# Patient Record
Sex: Female | Born: 1935 | Race: White | Hispanic: No | Smoking: Never smoker
Health system: Southern US, Community
[De-identification: ages and names within clinical notes are randomized; demographics above are authoritative.]

## PROBLEM LIST (undated history)

## (undated) DIAGNOSIS — F32A Depression, unspecified: Secondary | ICD-10-CM

## (undated) DIAGNOSIS — C44319 Basal cell carcinoma of skin of other parts of face: Secondary | ICD-10-CM

## (undated) DIAGNOSIS — I10 Essential (primary) hypertension: Secondary | ICD-10-CM

## (undated) DIAGNOSIS — N200 Calculus of kidney: Secondary | ICD-10-CM

## (undated) DIAGNOSIS — F419 Anxiety disorder, unspecified: Secondary | ICD-10-CM

## (undated) DIAGNOSIS — N201 Calculus of ureter: Secondary | ICD-10-CM

## (undated) DIAGNOSIS — F0390 Unspecified dementia without behavioral disturbance: Secondary | ICD-10-CM

## (undated) DIAGNOSIS — E785 Hyperlipidemia, unspecified: Secondary | ICD-10-CM

## (undated) DIAGNOSIS — Z8719 Personal history of other diseases of the digestive system: Secondary | ICD-10-CM

## (undated) DIAGNOSIS — Z950 Presence of cardiac pacemaker: Secondary | ICD-10-CM

## (undated) DIAGNOSIS — K219 Gastro-esophageal reflux disease without esophagitis: Secondary | ICD-10-CM

## (undated) DIAGNOSIS — I509 Heart failure, unspecified: Secondary | ICD-10-CM

## (undated) DIAGNOSIS — J189 Pneumonia, unspecified organism: Secondary | ICD-10-CM

## (undated) DIAGNOSIS — I872 Venous insufficiency (chronic) (peripheral): Secondary | ICD-10-CM

## (undated) DIAGNOSIS — I495 Sick sinus syndrome: Secondary | ICD-10-CM

## (undated) DIAGNOSIS — Z953 Presence of xenogenic heart valve: Secondary | ICD-10-CM

## (undated) DIAGNOSIS — F329 Major depressive disorder, single episode, unspecified: Secondary | ICD-10-CM

## (undated) DIAGNOSIS — M1990 Unspecified osteoarthritis, unspecified site: Secondary | ICD-10-CM

## (undated) DIAGNOSIS — G250 Essential tremor: Secondary | ICD-10-CM

## (undated) DIAGNOSIS — Z973 Presence of spectacles and contact lenses: Secondary | ICD-10-CM

## (undated) DIAGNOSIS — R600 Localized edema: Secondary | ICD-10-CM

## (undated) DIAGNOSIS — K222 Esophageal obstruction: Secondary | ICD-10-CM

## (undated) DIAGNOSIS — E213 Hyperparathyroidism, unspecified: Secondary | ICD-10-CM

## (undated) DIAGNOSIS — I4821 Permanent atrial fibrillation: Secondary | ICD-10-CM

## (undated) DIAGNOSIS — M8580 Other specified disorders of bone density and structure, unspecified site: Secondary | ICD-10-CM

## (undated) DIAGNOSIS — D693 Immune thrombocytopenic purpura: Secondary | ICD-10-CM

## (undated) DIAGNOSIS — I639 Cerebral infarction, unspecified: Secondary | ICD-10-CM

## (undated) HISTORY — PX: TRANSTHORACIC ECHOCARDIOGRAM: SHX275

## (undated) HISTORY — PX: ESOPHAGOGASTRODUODENOSCOPY (EGD) WITH ESOPHAGEAL DILATION: SHX5812

## (undated) HISTORY — PX: OTHER SURGICAL HISTORY: SHX169

## (undated) HISTORY — PX: CARDIAC CATHETERIZATION: SHX172

## (undated) HISTORY — PX: CARDIAC VALVE REPLACEMENT: SHX585

## (undated) HISTORY — PX: CATARACT EXTRACTION W/ INTRAOCULAR LENS  IMPLANT, BILATERAL: SHX1307

---
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
## 1980-02-15 HISTORY — PX: VAGINAL HYSTERECTOMY: SUR661

## 1997-08-27 ENCOUNTER — Ambulatory Visit (HOSPITAL_COMMUNITY): Admission: RE | Admit: 1997-08-27 | Discharge: 1997-08-27 | Payer: Self-pay | Admitting: Gastroenterology

## 1998-07-23 ENCOUNTER — Inpatient Hospital Stay (HOSPITAL_COMMUNITY): Admission: AD | Admit: 1998-07-23 | Discharge: 1998-07-23 | Payer: Self-pay | Admitting: Obstetrics & Gynecology

## 1998-11-11 ENCOUNTER — Other Ambulatory Visit: Admission: RE | Admit: 1998-11-11 | Discharge: 1998-11-11 | Payer: Self-pay | Admitting: Obstetrics and Gynecology

## 1999-03-01 HISTORY — PX: NM MYOCAR PERF WALL MOTION: HXRAD629

## 1999-03-11 ENCOUNTER — Encounter: Admission: RE | Admit: 1999-03-11 | Discharge: 1999-03-11 | Payer: Self-pay | Admitting: Cardiology

## 1999-03-11 ENCOUNTER — Encounter: Payer: Self-pay | Admitting: Cardiology

## 1999-03-17 ENCOUNTER — Ambulatory Visit (HOSPITAL_COMMUNITY): Admission: RE | Admit: 1999-03-17 | Discharge: 1999-03-17 | Payer: Self-pay | Admitting: Cardiology

## 1999-05-31 ENCOUNTER — Ambulatory Visit (HOSPITAL_COMMUNITY): Admission: RE | Admit: 1999-05-31 | Discharge: 1999-05-31 | Payer: Self-pay | Admitting: Gastroenterology

## 1999-05-31 ENCOUNTER — Encounter: Payer: Self-pay | Admitting: Gastroenterology

## 1999-10-05 ENCOUNTER — Encounter: Payer: Self-pay | Admitting: Gastroenterology

## 1999-10-05 ENCOUNTER — Ambulatory Visit (HOSPITAL_COMMUNITY): Admission: RE | Admit: 1999-10-05 | Discharge: 1999-10-05 | Payer: Self-pay | Admitting: Gastroenterology

## 1999-11-27 ENCOUNTER — Encounter: Payer: Self-pay | Admitting: Cardiovascular Disease

## 1999-11-27 ENCOUNTER — Inpatient Hospital Stay (HOSPITAL_COMMUNITY): Admission: EM | Admit: 1999-11-27 | Discharge: 1999-11-27 | Payer: Self-pay | Admitting: Emergency Medicine

## 2001-03-06 ENCOUNTER — Inpatient Hospital Stay (HOSPITAL_COMMUNITY): Admission: EM | Admit: 2001-03-06 | Discharge: 2001-03-10 | Payer: Self-pay

## 2001-03-06 ENCOUNTER — Encounter: Payer: Self-pay | Admitting: Cardiology

## 2001-03-28 ENCOUNTER — Encounter: Payer: Self-pay | Admitting: Family Medicine

## 2001-03-28 ENCOUNTER — Ambulatory Visit (HOSPITAL_COMMUNITY): Admission: RE | Admit: 2001-03-28 | Discharge: 2001-03-28 | Payer: Self-pay | Admitting: Family Medicine

## 2001-07-08 ENCOUNTER — Emergency Department (HOSPITAL_COMMUNITY): Admission: EM | Admit: 2001-07-08 | Discharge: 2001-07-08 | Payer: Self-pay | Admitting: Emergency Medicine

## 2002-01-31 ENCOUNTER — Inpatient Hospital Stay (HOSPITAL_COMMUNITY): Admission: EM | Admit: 2002-01-31 | Discharge: 2002-02-02 | Payer: Self-pay | Admitting: Emergency Medicine

## 2002-01-31 ENCOUNTER — Encounter: Payer: Self-pay | Admitting: Emergency Medicine

## 2002-02-04 ENCOUNTER — Inpatient Hospital Stay (HOSPITAL_COMMUNITY): Admission: EM | Admit: 2002-02-04 | Discharge: 2002-02-06 | Payer: Self-pay | Admitting: *Deleted

## 2002-11-27 ENCOUNTER — Encounter: Admission: RE | Admit: 2002-11-27 | Discharge: 2003-01-08 | Payer: Self-pay | Admitting: Family Medicine

## 2003-07-10 ENCOUNTER — Emergency Department (HOSPITAL_COMMUNITY): Admission: EM | Admit: 2003-07-10 | Discharge: 2003-07-10 | Payer: Self-pay | Admitting: Emergency Medicine

## 2003-12-29 ENCOUNTER — Other Ambulatory Visit: Admission: RE | Admit: 2003-12-29 | Discharge: 2003-12-29 | Payer: Self-pay | Admitting: Obstetrics and Gynecology

## 2004-06-18 ENCOUNTER — Emergency Department (HOSPITAL_COMMUNITY): Admission: EM | Admit: 2004-06-18 | Discharge: 2004-06-19 | Payer: Self-pay | Admitting: Emergency Medicine

## 2006-01-30 ENCOUNTER — Emergency Department (HOSPITAL_COMMUNITY): Admission: EM | Admit: 2006-01-30 | Discharge: 2006-01-30 | Payer: Self-pay | Admitting: Emergency Medicine

## 2007-02-12 ENCOUNTER — Ambulatory Visit: Payer: Self-pay | Admitting: Internal Medicine

## 2007-02-12 ENCOUNTER — Inpatient Hospital Stay (HOSPITAL_COMMUNITY): Admission: EM | Admit: 2007-02-12 | Discharge: 2007-02-28 | Payer: Self-pay | Admitting: Emergency Medicine

## 2007-02-13 ENCOUNTER — Encounter: Payer: Self-pay | Admitting: Internal Medicine

## 2007-02-13 ENCOUNTER — Ambulatory Visit: Payer: Self-pay | Admitting: Vascular Surgery

## 2007-02-17 ENCOUNTER — Encounter: Payer: Self-pay | Admitting: Pulmonary Disease

## 2007-02-26 HISTORY — PX: PERMANENT PACEMAKER INSERTION: SHX6023

## 2007-03-01 ENCOUNTER — Telehealth: Payer: Self-pay | Admitting: Internal Medicine

## 2007-03-13 ENCOUNTER — Ambulatory Visit: Payer: Self-pay | Admitting: Internal Medicine

## 2007-03-13 DIAGNOSIS — J180 Bronchopneumonia, unspecified organism: Secondary | ICD-10-CM | POA: Insufficient documentation

## 2007-03-27 ENCOUNTER — Ambulatory Visit: Payer: Self-pay | Admitting: Cardiovascular Disease

## 2007-04-04 ENCOUNTER — Ambulatory Visit: Payer: Self-pay | Admitting: Internal Medicine

## 2007-04-04 DIAGNOSIS — I5032 Chronic diastolic (congestive) heart failure: Secondary | ICD-10-CM | POA: Insufficient documentation

## 2007-04-05 ENCOUNTER — Telehealth (INDEPENDENT_AMBULATORY_CARE_PROVIDER_SITE_OTHER): Payer: Self-pay | Admitting: *Deleted

## 2007-04-05 ENCOUNTER — Telehealth: Payer: Self-pay | Admitting: Internal Medicine

## 2007-04-12 ENCOUNTER — Encounter (HOSPITAL_COMMUNITY): Admission: RE | Admit: 2007-04-12 | Discharge: 2007-07-11 | Payer: Self-pay | Admitting: Cardiology

## 2007-07-12 ENCOUNTER — Encounter (HOSPITAL_COMMUNITY): Admission: RE | Admit: 2007-07-12 | Discharge: 2007-08-15 | Payer: Self-pay | Admitting: Cardiology

## 2007-12-14 ENCOUNTER — Telehealth (INDEPENDENT_AMBULATORY_CARE_PROVIDER_SITE_OTHER): Payer: Self-pay | Admitting: *Deleted

## 2008-01-23 ENCOUNTER — Ambulatory Visit: Payer: Self-pay | Admitting: Internal Medicine

## 2009-02-09 ENCOUNTER — Encounter: Admission: RE | Admit: 2009-02-09 | Discharge: 2009-02-09 | Payer: Self-pay | Admitting: Cardiology

## 2009-03-11 ENCOUNTER — Encounter (INDEPENDENT_AMBULATORY_CARE_PROVIDER_SITE_OTHER): Payer: Self-pay | Admitting: Cardiology

## 2009-03-11 ENCOUNTER — Ambulatory Visit (HOSPITAL_COMMUNITY): Admission: RE | Admit: 2009-03-11 | Discharge: 2009-03-11 | Payer: Self-pay | Admitting: Cardiology

## 2009-03-18 ENCOUNTER — Ambulatory Visit: Payer: Self-pay | Admitting: Cardiothoracic Surgery

## 2009-03-27 ENCOUNTER — Ambulatory Visit (HOSPITAL_COMMUNITY): Admission: RE | Admit: 2009-03-27 | Discharge: 2009-03-27 | Payer: Self-pay | Admitting: Cardiology

## 2009-03-27 HISTORY — PX: CARDIAC CATHETERIZATION: SHX172

## 2009-04-01 ENCOUNTER — Ambulatory Visit: Payer: Self-pay | Admitting: Vascular Surgery

## 2009-04-01 ENCOUNTER — Encounter: Payer: Self-pay | Admitting: Cardiothoracic Surgery

## 2009-04-03 ENCOUNTER — Encounter: Payer: Self-pay | Admitting: Cardiothoracic Surgery

## 2009-04-03 ENCOUNTER — Inpatient Hospital Stay (HOSPITAL_COMMUNITY): Admission: RE | Admit: 2009-04-03 | Discharge: 2009-04-13 | Payer: Self-pay | Admitting: Cardiothoracic Surgery

## 2009-04-03 ENCOUNTER — Ambulatory Visit: Payer: Self-pay | Admitting: Cardiothoracic Surgery

## 2009-04-17 ENCOUNTER — Inpatient Hospital Stay (HOSPITAL_COMMUNITY): Admission: EM | Admit: 2009-04-17 | Discharge: 2009-04-23 | Payer: Self-pay | Admitting: Emergency Medicine

## 2009-04-21 ENCOUNTER — Encounter (INDEPENDENT_AMBULATORY_CARE_PROVIDER_SITE_OTHER): Payer: Self-pay | Admitting: Internal Medicine

## 2009-05-06 ENCOUNTER — Ambulatory Visit: Payer: Self-pay | Admitting: Cardiothoracic Surgery

## 2009-05-06 ENCOUNTER — Encounter: Admission: RE | Admit: 2009-05-06 | Discharge: 2009-05-06 | Payer: Self-pay | Admitting: Cardiothoracic Surgery

## 2009-05-20 ENCOUNTER — Ambulatory Visit: Payer: Self-pay | Admitting: Cardiothoracic Surgery

## 2009-05-28 ENCOUNTER — Encounter (HOSPITAL_COMMUNITY): Admission: RE | Admit: 2009-05-28 | Discharge: 2009-08-13 | Payer: Self-pay | Admitting: Cardiology

## 2009-06-17 ENCOUNTER — Ambulatory Visit: Payer: Self-pay | Admitting: Cardiothoracic Surgery

## 2009-07-27 ENCOUNTER — Ambulatory Visit: Payer: Self-pay | Admitting: Cardiothoracic Surgery

## 2009-08-14 ENCOUNTER — Encounter (HOSPITAL_COMMUNITY): Admission: RE | Admit: 2009-08-14 | Discharge: 2009-08-28 | Payer: Self-pay | Admitting: Cardiology

## 2010-05-07 LAB — POCT I-STAT 3, ART BLOOD GAS (G3+)
Acid-Base Excess: 2 mmol/L (ref 0.0–2.0)
Acid-Base Excess: 3 mmol/L — ABNORMAL HIGH (ref 0.0–2.0)
Acid-Base Excess: 5 mmol/L — ABNORMAL HIGH (ref 0.0–2.0)
Acid-base deficit: 1 mmol/L (ref 0.0–2.0)
Acid-base deficit: 2 mmol/L (ref 0.0–2.0)
Acid-base deficit: 3 mmol/L — ABNORMAL HIGH (ref 0.0–2.0)
Acid-base deficit: 3 mmol/L — ABNORMAL HIGH (ref 0.0–2.0)
Acid-base deficit: 4 mmol/L — ABNORMAL HIGH (ref 0.0–2.0)
Bicarbonate: 22.1 mEq/L (ref 20.0–24.0)
Bicarbonate: 22.2 mEq/L (ref 20.0–24.0)
Bicarbonate: 22.2 mEq/L (ref 20.0–24.0)
Bicarbonate: 22.6 mEq/L (ref 20.0–24.0)
Bicarbonate: 23.3 mEq/L (ref 20.0–24.0)
Bicarbonate: 24.7 mEq/L — ABNORMAL HIGH (ref 20.0–24.0)
Bicarbonate: 26.8 mEq/L — ABNORMAL HIGH (ref 20.0–24.0)
Bicarbonate: 29.6 mEq/L — ABNORMAL HIGH (ref 20.0–24.0)
O2 Saturation: 100 %
O2 Saturation: 100 %
O2 Saturation: 100 %
O2 Saturation: 100 %
O2 Saturation: 91 %
O2 Saturation: 96 %
O2 Saturation: 97 %
O2 Saturation: 97 %
O2 Saturation: 98 %
Patient temperature: 35.5
Patient temperature: 36.7
Patient temperature: 36.8
Patient temperature: 37.6
TCO2: 23 mmol/L (ref 0–100)
TCO2: 23 mmol/L (ref 0–100)
TCO2: 24 mmol/L (ref 0–100)
TCO2: 24 mmol/L (ref 0–100)
TCO2: 24 mmol/L (ref 0–100)
TCO2: 26 mmol/L (ref 0–100)
TCO2: 27 mmol/L (ref 0–100)
TCO2: 28 mmol/L (ref 0–100)
TCO2: 31 mmol/L (ref 0–100)
pCO2 arterial: 34.3 mmHg — ABNORMAL LOW (ref 35.0–45.0)
pCO2 arterial: 34.9 mmHg — ABNORMAL LOW (ref 35.0–45.0)
pCO2 arterial: 38.3 mmHg (ref 35.0–45.0)
pCO2 arterial: 38.9 mmHg (ref 35.0–45.0)
pCO2 arterial: 39.3 mmHg (ref 35.0–45.0)
pCO2 arterial: 41 mmHg (ref 35.0–45.0)
pCO2 arterial: 41.1 mmHg (ref 35.0–45.0)
pCO2 arterial: 42.3 mmHg (ref 35.0–45.0)
pCO2 arterial: 44.9 mmHg (ref 35.0–45.0)
pH, Arterial: 7.301 — ABNORMAL LOW (ref 7.350–7.400)
pH, Arterial: 7.341 — ABNORMAL LOW (ref 7.350–7.400)
pH, Arterial: 7.375 (ref 7.350–7.400)
pH, Arterial: 7.389 (ref 7.350–7.400)
pH, Arterial: 7.392 (ref 7.350–7.400)
pH, Arterial: 7.41 — ABNORMAL HIGH (ref 7.350–7.400)
pH, Arterial: 7.453 — ABNORMAL HIGH (ref 7.350–7.400)
pH, Arterial: 7.493 — ABNORMAL HIGH (ref 7.350–7.400)
pO2, Arterial: 103 mmHg — ABNORMAL HIGH (ref 80.0–100.0)
pO2, Arterial: 248 mmHg — ABNORMAL HIGH (ref 80.0–100.0)
pO2, Arterial: 256 mmHg — ABNORMAL HIGH (ref 80.0–100.0)
pO2, Arterial: 260 mmHg — ABNORMAL HIGH (ref 80.0–100.0)
pO2, Arterial: 389 mmHg — ABNORMAL HIGH (ref 80.0–100.0)
pO2, Arterial: 82 mmHg (ref 80.0–100.0)
pO2, Arterial: 87 mmHg (ref 80.0–100.0)
pO2, Arterial: 97 mmHg (ref 80.0–100.0)

## 2010-05-07 LAB — URINALYSIS, ROUTINE W REFLEX MICROSCOPIC
Bilirubin Urine: NEGATIVE
Bilirubin Urine: NEGATIVE
Glucose, UA: NEGATIVE mg/dL
Glucose, UA: NEGATIVE mg/dL
Hgb urine dipstick: NEGATIVE
Hgb urine dipstick: NEGATIVE
Ketones, ur: NEGATIVE mg/dL
Ketones, ur: NEGATIVE mg/dL
Nitrite: NEGATIVE
Nitrite: NEGATIVE
Protein, ur: NEGATIVE mg/dL
Protein, ur: NEGATIVE mg/dL
Specific Gravity, Urine: 1.016 (ref 1.005–1.030)
Specific Gravity, Urine: 1.022 (ref 1.005–1.030)
Urobilinogen, UA: 0.2 mg/dL (ref 0.0–1.0)
Urobilinogen, UA: 0.2 mg/dL (ref 0.0–1.0)
pH: 5.5 (ref 5.0–8.0)
pH: 6 (ref 5.0–8.0)

## 2010-05-07 LAB — PREPARE PLATELETS

## 2010-05-07 LAB — CBC
HCT: 24.6 % — ABNORMAL LOW (ref 36.0–46.0)
HCT: 25.1 % — ABNORMAL LOW (ref 36.0–46.0)
HCT: 25.3 % — ABNORMAL LOW (ref 36.0–46.0)
HCT: 25.5 % — ABNORMAL LOW (ref 36.0–46.0)
HCT: 25.5 % — ABNORMAL LOW (ref 36.0–46.0)
HCT: 25.9 % — ABNORMAL LOW (ref 36.0–46.0)
HCT: 26.5 % — ABNORMAL LOW (ref 36.0–46.0)
HCT: 27.6 % — ABNORMAL LOW (ref 36.0–46.0)
HCT: 27.9 % — ABNORMAL LOW (ref 36.0–46.0)
HCT: 28.1 % — ABNORMAL LOW (ref 36.0–46.0)
HCT: 41.8 % (ref 36.0–46.0)
Hemoglobin: 14.1 g/dL (ref 12.0–15.0)
Hemoglobin: 8.3 g/dL — ABNORMAL LOW (ref 12.0–15.0)
Hemoglobin: 8.5 g/dL — ABNORMAL LOW (ref 12.0–15.0)
Hemoglobin: 8.5 g/dL — ABNORMAL LOW (ref 12.0–15.0)
Hemoglobin: 8.6 g/dL — ABNORMAL LOW (ref 12.0–15.0)
Hemoglobin: 8.8 g/dL — ABNORMAL LOW (ref 12.0–15.0)
Hemoglobin: 8.8 g/dL — ABNORMAL LOW (ref 12.0–15.0)
Hemoglobin: 8.8 g/dL — ABNORMAL LOW (ref 12.0–15.0)
Hemoglobin: 9.3 g/dL — ABNORMAL LOW (ref 12.0–15.0)
Hemoglobin: 9.5 g/dL — ABNORMAL LOW (ref 12.0–15.0)
Hemoglobin: 9.6 g/dL — ABNORMAL LOW (ref 12.0–15.0)
MCHC: 33.2 g/dL (ref 30.0–36.0)
MCHC: 33.7 g/dL (ref 30.0–36.0)
MCHC: 33.7 g/dL (ref 30.0–36.0)
MCHC: 33.7 g/dL (ref 30.0–36.0)
MCHC: 33.7 g/dL (ref 30.0–36.0)
MCHC: 33.8 g/dL (ref 30.0–36.0)
MCHC: 33.8 g/dL (ref 30.0–36.0)
MCHC: 33.9 g/dL (ref 30.0–36.0)
MCHC: 34 g/dL (ref 30.0–36.0)
MCHC: 34.2 g/dL (ref 30.0–36.0)
MCHC: 34.7 g/dL (ref 30.0–36.0)
MCV: 93 fL (ref 78.0–100.0)
MCV: 93.4 fL (ref 78.0–100.0)
MCV: 93.9 fL (ref 78.0–100.0)
MCV: 94.1 fL (ref 78.0–100.0)
MCV: 94.3 fL (ref 78.0–100.0)
MCV: 94.3 fL (ref 78.0–100.0)
MCV: 94.6 fL (ref 78.0–100.0)
MCV: 94.8 fL (ref 78.0–100.0)
MCV: 94.9 fL (ref 78.0–100.0)
MCV: 95.2 fL (ref 78.0–100.0)
MCV: 95.3 fL (ref 78.0–100.0)
Platelets: 100 10*3/uL — ABNORMAL LOW (ref 150–400)
Platelets: 116 10*3/uL — ABNORMAL LOW (ref 150–400)
Platelets: 58 10*3/uL — ABNORMAL LOW (ref 150–400)
Platelets: 60 10*3/uL — ABNORMAL LOW (ref 150–400)
Platelets: 69 10*3/uL — ABNORMAL LOW (ref 150–400)
Platelets: 70 10*3/uL — ABNORMAL LOW (ref 150–400)
Platelets: 71 10*3/uL — ABNORMAL LOW (ref 150–400)
Platelets: 76 10*3/uL — ABNORMAL LOW (ref 150–400)
Platelets: 83 10*3/uL — ABNORMAL LOW (ref 150–400)
Platelets: 88 10*3/uL — ABNORMAL LOW (ref 150–400)
Platelets: 99 10*3/uL — ABNORMAL LOW (ref 150–400)
RBC: 2.61 MIL/uL — ABNORMAL LOW (ref 3.87–5.11)
RBC: 2.65 MIL/uL — ABNORMAL LOW (ref 3.87–5.11)
RBC: 2.69 MIL/uL — ABNORMAL LOW (ref 3.87–5.11)
RBC: 2.71 MIL/uL — ABNORMAL LOW (ref 3.87–5.11)
RBC: 2.74 MIL/uL — ABNORMAL LOW (ref 3.87–5.11)
RBC: 2.76 MIL/uL — ABNORMAL LOW (ref 3.87–5.11)
RBC: 2.78 MIL/uL — ABNORMAL LOW (ref 3.87–5.11)
RBC: 2.92 MIL/uL — ABNORMAL LOW (ref 3.87–5.11)
RBC: 2.95 MIL/uL — ABNORMAL LOW (ref 3.87–5.11)
RBC: 2.99 MIL/uL — ABNORMAL LOW (ref 3.87–5.11)
RBC: 4.42 MIL/uL (ref 3.87–5.11)
RDW: 14.5 % (ref 11.5–15.5)
RDW: 14.6 % (ref 11.5–15.5)
RDW: 14.8 % (ref 11.5–15.5)
RDW: 14.9 % (ref 11.5–15.5)
RDW: 15.2 % (ref 11.5–15.5)
RDW: 15.2 % (ref 11.5–15.5)
RDW: 15.2 % (ref 11.5–15.5)
RDW: 15.2 % (ref 11.5–15.5)
RDW: 15.3 % (ref 11.5–15.5)
RDW: 15.5 % (ref 11.5–15.5)
RDW: 16 % — ABNORMAL HIGH (ref 11.5–15.5)
WBC: 10 10*3/uL (ref 4.0–10.5)
WBC: 10.1 10*3/uL (ref 4.0–10.5)
WBC: 10.5 10*3/uL (ref 4.0–10.5)
WBC: 6.1 10*3/uL (ref 4.0–10.5)
WBC: 6.8 10*3/uL (ref 4.0–10.5)
WBC: 7.8 10*3/uL (ref 4.0–10.5)
WBC: 7.9 10*3/uL (ref 4.0–10.5)
WBC: 8.1 10*3/uL (ref 4.0–10.5)
WBC: 8.1 10*3/uL (ref 4.0–10.5)
WBC: 8.6 10*3/uL (ref 4.0–10.5)
WBC: 9.8 10*3/uL (ref 4.0–10.5)

## 2010-05-07 LAB — POCT I-STAT 3, VENOUS BLOOD GAS (G3P V)
Acid-Base Excess: 1 mmol/L (ref 0.0–2.0)
Acid-Base Excess: 2 mmol/L (ref 0.0–2.0)
Bicarbonate: 25.7 mEq/L — ABNORMAL HIGH (ref 20.0–24.0)
Bicarbonate: 27 mEq/L — ABNORMAL HIGH (ref 20.0–24.0)
O2 Saturation: 60 %
O2 Saturation: 82 %
TCO2: 27 mmol/L (ref 0–100)
TCO2: 28 mmol/L (ref 0–100)
pCO2, Ven: 35.1 mmHg — ABNORMAL LOW (ref 45.0–50.0)
pH, Ven: 7.472 — ABNORMAL HIGH (ref 7.250–7.300)
pO2, Ven: 43 mmHg (ref 30.0–45.0)

## 2010-05-07 LAB — BASIC METABOLIC PANEL
BUN: 11 mg/dL (ref 6–23)
BUN: 20 mg/dL (ref 6–23)
BUN: 24 mg/dL — ABNORMAL HIGH (ref 6–23)
CO2: 23 mEq/L (ref 19–32)
CO2: 31 mEq/L (ref 19–32)
CO2: 32 mEq/L (ref 19–32)
Calcium: 8.9 mg/dL (ref 8.4–10.5)
Calcium: 8.9 mg/dL (ref 8.4–10.5)
Calcium: 9.3 mg/dL (ref 8.4–10.5)
Chloride: 103 mEq/L (ref 96–112)
Chloride: 104 mEq/L (ref 96–112)
Chloride: 113 mEq/L — ABNORMAL HIGH (ref 96–112)
Creatinine, Ser: 0.92 mg/dL (ref 0.4–1.2)
Creatinine, Ser: 1.03 mg/dL (ref 0.4–1.2)
Creatinine, Ser: 1.13 mg/dL (ref 0.4–1.2)
GFR calc Af Amer: 56 mL/min — ABNORMAL LOW (ref >60)
GFR calc Af Amer: >60 mL/min (ref >60)
GFR calc Af Amer: >60 mL/min (ref >60)
GFR calc non Af Amer: 47 mL/min — ABNORMAL LOW (ref >60)
GFR calc non Af Amer: 52 mL/min — ABNORMAL LOW (ref >60)
GFR calc non Af Amer: 59 mL/min — ABNORMAL LOW (ref >60)
Glucose, Bld: 115 mg/dL — ABNORMAL HIGH (ref 70–99)
Glucose, Bld: 144 mg/dL — ABNORMAL HIGH (ref 70–99)
Glucose, Bld: 85 mg/dL (ref 70–99)
Potassium: 3.5 mEq/L (ref 3.5–5.1)
Potassium: 4 mEq/L (ref 3.5–5.1)
Potassium: 4.1 mEq/L (ref 3.5–5.1)
Sodium: 138 mEq/L (ref 135–145)
Sodium: 140 mEq/L (ref 135–145)
Sodium: 140 mEq/L (ref 135–145)

## 2010-05-07 LAB — TYPE AND SCREEN
ABO/RH(D): A POS
Antibody Screen: NEGATIVE

## 2010-05-07 LAB — URINE CULTURE
Colony Count: NO GROWTH
Culture: NO GROWTH

## 2010-05-07 LAB — PREPARE RBC (CROSSMATCH)

## 2010-05-07 LAB — POCT I-STAT 4, (NA,K, GLUC, HGB,HCT)
Glucose, Bld: 103 mg/dL — ABNORMAL HIGH (ref 70–99)
Glucose, Bld: 112 mg/dL — ABNORMAL HIGH (ref 70–99)
Glucose, Bld: 119 mg/dL — ABNORMAL HIGH (ref 70–99)
Glucose, Bld: 128 mg/dL — ABNORMAL HIGH (ref 70–99)
Glucose, Bld: 136 mg/dL — ABNORMAL HIGH (ref 70–99)
Glucose, Bld: 139 mg/dL — ABNORMAL HIGH (ref 70–99)
Glucose, Bld: 89 mg/dL (ref 70–99)
Glucose, Bld: 90 mg/dL (ref 70–99)
Glucose, Bld: 93 mg/dL (ref 70–99)
Glucose, Bld: 94 mg/dL (ref 70–99)
Glucose, Bld: 94 mg/dL (ref 70–99)
Glucose, Bld: 97 mg/dL (ref 70–99)
HCT: 24 % — ABNORMAL LOW (ref 36.0–46.0)
HCT: 25 % — ABNORMAL LOW (ref 36.0–46.0)
HCT: 26 % — ABNORMAL LOW (ref 36.0–46.0)
HCT: 26 % — ABNORMAL LOW (ref 36.0–46.0)
HCT: 26 % — ABNORMAL LOW (ref 36.0–46.0)
HCT: 26 % — ABNORMAL LOW (ref 36.0–46.0)
HCT: 26 % — ABNORMAL LOW (ref 36.0–46.0)
HCT: 26 % — ABNORMAL LOW (ref 36.0–46.0)
HCT: 26 % — ABNORMAL LOW (ref 36.0–46.0)
HCT: 31 % — ABNORMAL LOW (ref 36.0–46.0)
HCT: 34 % — ABNORMAL LOW (ref 36.0–46.0)
HCT: 37 % (ref 36.0–46.0)
Hemoglobin: 10.5 g/dL — ABNORMAL LOW (ref 12.0–15.0)
Hemoglobin: 11.6 g/dL — ABNORMAL LOW (ref 12.0–15.0)
Hemoglobin: 12.6 g/dL (ref 12.0–15.0)
Hemoglobin: 8.2 g/dL — ABNORMAL LOW (ref 12.0–15.0)
Hemoglobin: 8.5 g/dL — ABNORMAL LOW (ref 12.0–15.0)
Hemoglobin: 8.8 g/dL — ABNORMAL LOW (ref 12.0–15.0)
Hemoglobin: 8.8 g/dL — ABNORMAL LOW (ref 12.0–15.0)
Hemoglobin: 8.8 g/dL — ABNORMAL LOW (ref 12.0–15.0)
Hemoglobin: 8.8 g/dL — ABNORMAL LOW (ref 12.0–15.0)
Hemoglobin: 8.8 g/dL — ABNORMAL LOW (ref 12.0–15.0)
Hemoglobin: 8.8 g/dL — ABNORMAL LOW (ref 12.0–15.0)
Hemoglobin: 8.8 g/dL — ABNORMAL LOW (ref 12.0–15.0)
Potassium: 3.2 mEq/L — ABNORMAL LOW (ref 3.5–5.1)
Potassium: 3.2 mEq/L — ABNORMAL LOW (ref 3.5–5.1)
Potassium: 3.3 mEq/L — ABNORMAL LOW (ref 3.5–5.1)
Potassium: 3.4 mEq/L — ABNORMAL LOW (ref 3.5–5.1)
Potassium: 3.6 mEq/L (ref 3.5–5.1)
Potassium: 3.6 mEq/L (ref 3.5–5.1)
Potassium: 3.7 mEq/L (ref 3.5–5.1)
Potassium: 3.9 mEq/L (ref 3.5–5.1)
Potassium: 4 mEq/L (ref 3.5–5.1)
Potassium: 4 mEq/L (ref 3.5–5.1)
Potassium: 4.1 mEq/L (ref 3.5–5.1)
Potassium: 4.2 mEq/L (ref 3.5–5.1)
Sodium: 135 mEq/L (ref 135–145)
Sodium: 136 mEq/L (ref 135–145)
Sodium: 137 mEq/L (ref 135–145)
Sodium: 138 mEq/L (ref 135–145)
Sodium: 140 mEq/L (ref 135–145)
Sodium: 140 mEq/L (ref 135–145)
Sodium: 141 mEq/L (ref 135–145)
Sodium: 141 mEq/L (ref 135–145)
Sodium: 142 mEq/L (ref 135–145)
Sodium: 142 mEq/L (ref 135–145)
Sodium: 143 mEq/L (ref 135–145)
Sodium: 143 mEq/L (ref 135–145)

## 2010-05-07 LAB — CREATININE, SERUM
Creatinine, Ser: 0.86 mg/dL (ref 0.4–1.2)
Creatinine, Ser: 0.95 mg/dL (ref 0.4–1.2)
GFR calc Af Amer: >60 mL/min (ref >60)
GFR calc Af Amer: >60 mL/min (ref >60)
GFR calc non Af Amer: 57 mL/min — ABNORMAL LOW (ref >60)
GFR calc non Af Amer: >60 mL/min (ref >60)

## 2010-05-07 LAB — BLOOD GAS, ARTERIAL
Acid-Base Excess: 2.4 mmol/L — ABNORMAL HIGH (ref 0.0–2.0)
Bicarbonate: 26.2 mEq/L — ABNORMAL HIGH (ref 20.0–24.0)
Drawn by: 181601
FIO2: 0.21 %
O2 Saturation: 98.4 %
Patient temperature: 98.6
TCO2: 27.4 mmol/L (ref 0–100)
pCO2 arterial: 38.6 mmHg (ref 35.0–45.0)
pH, Arterial: 7.446 — ABNORMAL HIGH (ref 7.350–7.400)
pO2, Arterial: 100 mmHg (ref 80.0–100.0)

## 2010-05-07 LAB — HEMOGLOBIN AND HEMATOCRIT, BLOOD
HCT: 24.9 % — ABNORMAL LOW (ref 36.0–46.0)
Hemoglobin: 8.5 g/dL — ABNORMAL LOW (ref 12.0–15.0)

## 2010-05-07 LAB — HEMOGLOBIN A1C: Mean Plasma Glucose: 117 mg/dL

## 2010-05-07 LAB — URINE MICROSCOPIC-ADD ON

## 2010-05-07 LAB — COMPREHENSIVE METABOLIC PANEL
ALT: 20 U/L (ref 0–35)
ALT: 24 U/L (ref 0–35)
AST: 30 U/L (ref 0–37)
AST: 56 U/L — ABNORMAL HIGH (ref 0–37)
Albumin: 3 g/dL — ABNORMAL LOW (ref 3.5–5.2)
Albumin: 3.6 g/dL (ref 3.5–5.2)
Alkaline Phosphatase: 27 U/L — ABNORMAL LOW (ref 39–117)
Alkaline Phosphatase: 52 U/L (ref 39–117)
BUN: 11 mg/dL (ref 6–23)
BUN: 20 mg/dL (ref 6–23)
CO2: 24 mEq/L (ref 19–32)
CO2: 30 mEq/L (ref 19–32)
Calcium: 9 mg/dL (ref 8.4–10.5)
Calcium: 9.9 mg/dL (ref 8.4–10.5)
Chloride: 109 mEq/L (ref 96–112)
Chloride: 110 mEq/L (ref 96–112)
Creatinine, Ser: 1.01 mg/dL (ref 0.4–1.2)
Creatinine, Ser: 1.03 mg/dL (ref 0.4–1.2)
GFR calc Af Amer: >60 mL/min (ref >60)
GFR calc non Af Amer: 52 mL/min — ABNORMAL LOW (ref >60)
GFR calc non Af Amer: 53 mL/min — ABNORMAL LOW (ref >60)
Glucose, Bld: 102 mg/dL — ABNORMAL HIGH (ref 70–99)
Glucose, Bld: 103 mg/dL — ABNORMAL HIGH (ref 70–99)
Potassium: 3.5 mEq/L (ref 3.5–5.1)
Potassium: 3.9 mEq/L (ref 3.5–5.1)
Sodium: 141 mEq/L (ref 135–145)
Sodium: 144 mEq/L (ref 135–145)
Total Bilirubin: 0.8 mg/dL (ref 0.3–1.2)
Total Bilirubin: 0.9 mg/dL (ref 0.3–1.2)
Total Protein: 4.9 g/dL — ABNORMAL LOW (ref 6.0–8.3)
Total Protein: 5.9 g/dL — ABNORMAL LOW (ref 6.0–8.3)

## 2010-05-07 LAB — POCT I-STAT, CHEM 8
BUN: 12 mg/dL (ref 6–23)
BUN: 12 mg/dL (ref 6–23)
Calcium, Ion: 1.28 mmol/L (ref 1.12–1.32)
Calcium, Ion: 1.36 mmol/L — ABNORMAL HIGH (ref 1.12–1.32)
Chloride: 108 mEq/L (ref 96–112)
Chloride: 115 mEq/L — ABNORMAL HIGH (ref 96–112)
Creatinine, Ser: 0.9 mg/dL (ref 0.4–1.2)
Creatinine, Ser: 1.1 mg/dL (ref 0.4–1.2)
Glucose, Bld: 141 mg/dL — ABNORMAL HIGH (ref 70–99)
Glucose, Bld: 162 mg/dL — ABNORMAL HIGH (ref 70–99)
HCT: 22 % — ABNORMAL LOW (ref 36.0–46.0)
HCT: 28 % — ABNORMAL LOW (ref 36.0–46.0)
Hemoglobin: 7.5 g/dL — ABNORMAL LOW (ref 12.0–15.0)
Hemoglobin: 9.5 g/dL — ABNORMAL LOW (ref 12.0–15.0)
Potassium: 3.5 mEq/L (ref 3.5–5.1)
Potassium: 4 mEq/L (ref 3.5–5.1)
Sodium: 141 mEq/L (ref 135–145)
Sodium: 142 mEq/L (ref 135–145)
TCO2: 22 mmol/L (ref 0–100)
TCO2: 28 mmol/L (ref 0–100)

## 2010-05-07 LAB — GLUCOSE, CAPILLARY
Glucose-Capillary: 100 mg/dL — ABNORMAL HIGH (ref 70–99)
Glucose-Capillary: 102 mg/dL — ABNORMAL HIGH (ref 70–99)
Glucose-Capillary: 106 mg/dL — ABNORMAL HIGH (ref 70–99)
Glucose-Capillary: 107 mg/dL — ABNORMAL HIGH (ref 70–99)
Glucose-Capillary: 114 mg/dL — ABNORMAL HIGH (ref 70–99)
Glucose-Capillary: 116 mg/dL — ABNORMAL HIGH (ref 70–99)
Glucose-Capillary: 116 mg/dL — ABNORMAL HIGH (ref 70–99)
Glucose-Capillary: 117 mg/dL — ABNORMAL HIGH (ref 70–99)
Glucose-Capillary: 121 mg/dL — ABNORMAL HIGH (ref 70–99)
Glucose-Capillary: 123 mg/dL — ABNORMAL HIGH (ref 70–99)
Glucose-Capillary: 137 mg/dL — ABNORMAL HIGH (ref 70–99)
Glucose-Capillary: 143 mg/dL — ABNORMAL HIGH (ref 70–99)
Glucose-Capillary: 154 mg/dL — ABNORMAL HIGH (ref 70–99)
Glucose-Capillary: 96 mg/dL (ref 70–99)

## 2010-05-07 LAB — SURGICAL PCR SCREEN: MRSA, PCR: NEGATIVE

## 2010-05-07 LAB — APTT
aPTT: 28 seconds (ref 24–37)
aPTT: 36 seconds (ref 24–37)

## 2010-05-07 LAB — ABO/RH: ABO/RH(D): A POS

## 2010-05-07 LAB — PROTIME-INR
INR: 0.99 (ref 0.00–1.49)
INR: 1.58 — ABNORMAL HIGH (ref 0.00–1.49)
Prothrombin Time: 13 seconds (ref 11.6–15.2)
Prothrombin Time: 18.7 seconds — ABNORMAL HIGH (ref 11.6–15.2)

## 2010-05-07 LAB — MAGNESIUM
Magnesium: 2.2 mg/dL (ref 1.5–2.5)
Magnesium: 2.5 mg/dL (ref 1.5–2.5)
Magnesium: 2.6 mg/dL — ABNORMAL HIGH (ref 1.5–2.5)

## 2010-05-07 LAB — PREPARE FRESH FROZEN PLASMA

## 2010-05-07 LAB — HEPARIN INDUCED THROMBOCYTOPENIA PNL
Heparin Induced Plt Ab: NEGATIVE
Patient O.D.: 0.121
Serotonin Release: 0 % release (ref <20)

## 2010-05-07 LAB — PLATELET COUNT: Platelets: 50 10*3/uL — ABNORMAL LOW (ref 150–400)

## 2010-05-07 LAB — MRSA PCR SCREENING: MRSA by PCR: NEGATIVE

## 2010-05-10 LAB — COMPREHENSIVE METABOLIC PANEL
BUN: 10 mg/dL (ref 6–23)
Calcium: 9.7 mg/dL (ref 8.4–10.5)
GFR calc non Af Amer: 51 mL/min — ABNORMAL LOW (ref >60)
Glucose, Bld: 103 mg/dL — ABNORMAL HIGH (ref 70–99)
Total Protein: 5 g/dL — ABNORMAL LOW (ref 6.0–8.3)

## 2010-05-10 LAB — URINALYSIS, ROUTINE W REFLEX MICROSCOPIC
Glucose, UA: NEGATIVE mg/dL
Protein, ur: NEGATIVE mg/dL
Specific Gravity, Urine: 1.012 (ref 1.005–1.030)
pH: 5.5 (ref 5.0–8.0)

## 2010-05-10 LAB — BASIC METABOLIC PANEL
CO2: 31 mEq/L (ref 19–32)
Calcium: 9.6 mg/dL (ref 8.4–10.5)
Chloride: 98 mEq/L (ref 96–112)
GFR calc Af Amer: >60 mL/min (ref >60)
GFR calc non Af Amer: 47 mL/min — ABNORMAL LOW (ref >60)
GFR calc non Af Amer: >60 mL/min (ref >60)
Glucose, Bld: 105 mg/dL — ABNORMAL HIGH (ref 70–99)
Glucose, Bld: 107 mg/dL — ABNORMAL HIGH (ref 70–99)
Potassium: 3.4 mEq/L — ABNORMAL LOW (ref 3.5–5.1)
Sodium: 135 mEq/L (ref 135–145)
Sodium: 136 mEq/L (ref 135–145)
Sodium: 140 mEq/L (ref 135–145)

## 2010-05-10 LAB — CBC
HCT: 28 % — ABNORMAL LOW (ref 36.0–46.0)
HCT: 28.6 % — ABNORMAL LOW (ref 36.0–46.0)
Hemoglobin: 9 g/dL — ABNORMAL LOW (ref 12.0–15.0)
Hemoglobin: 9.1 g/dL — ABNORMAL LOW (ref 12.0–15.0)
Hemoglobin: 9.4 g/dL — ABNORMAL LOW (ref 12.0–15.0)
MCHC: 32.8 g/dL (ref 30.0–36.0)
MCHC: 33 g/dL (ref 30.0–36.0)
MCV: 95 fL (ref 78.0–100.0)
MCV: 95.2 fL (ref 78.0–100.0)
RBC: 2.91 MIL/uL — ABNORMAL LOW (ref 3.87–5.11)
RBC: 2.92 MIL/uL — ABNORMAL LOW (ref 3.87–5.11)
RDW: 15.7 % — ABNORMAL HIGH (ref 11.5–15.5)
WBC: 5.7 10*3/uL (ref 4.0–10.5)

## 2010-05-10 LAB — CULTURE, BLOOD (ROUTINE X 2)

## 2010-05-10 LAB — DIFFERENTIAL
Basophils Relative: 1 % (ref 0–1)
Eosinophils Absolute: 0.1 10*3/uL (ref 0.0–0.7)
Eosinophils Relative: 2 % (ref 0–5)
Monocytes Absolute: 1 10*3/uL (ref 0.1–1.0)
Monocytes Relative: 11 % (ref 3–12)

## 2010-05-10 LAB — AMMONIA: Ammonia: 16 umol/L (ref 11–35)

## 2010-05-10 LAB — URINE MICROSCOPIC-ADD ON

## 2010-05-10 LAB — URINE CULTURE: Colony Count: >=100000

## 2010-05-10 LAB — MAGNESIUM: Magnesium: 1.8 mg/dL (ref 1.5–2.5)

## 2010-05-10 LAB — TSH: TSH: 1.673 u[IU]/mL (ref 0.350–4.500)

## 2010-06-29 NOTE — Assessment & Plan Note (Signed)
OFFICE VISIT   [name], [name]  DOB:  1930/02/09                                        Jun 17, 2009  CHART #:  [id number]   CURRENT PROBLEMS:  1. Status post mitral valve replacement with caudal preservation using      a bioprosthetic valve with left-sided maze procedure on April 03, 2009.  2. Chronic congestive heart failure and atrial fibrillation.  3. Familial tremor and deconditioning.   PRESENT ILLNESS:  [name] returns for an office visit.  She is  currently in the outpatient cardiac rehab program and looks very well.  She is increasing her activity level and strength and has no symptoms of  CHF.  She has noted a superficial redness to the right of the sternal  incision which is slightly uncomfortable.  She has had no fever.   PHYSICAL EXAMINATION:  She is afebrile.  Blood pressure 130/70, pulse is  80, saturation 96% on room air.  Breath sounds are clear.  The sternum  is stable and well healed.  Cardiac rhythm is regular.  There is no  murmur.  To the right of the mid sternum, there is a red area which  appears eczematous.  She has minimal baseline ankle edema.   IMPRESSION:  The patient has a well-healed sternal incision.  I do not  think this represents an infection or cellulitis.  It appears more to be  a primary derm related problem, probably eczema.  I have given her  prescription for Aristocort, topical steroid, to take for 2 weeks.  If  this does not  clear out the problem, then she will call us back, but I will plan on  seeing her back for a wound check in approximately 1 month.   Kerin Perna, M.D.  Electronically Signed   PV/MEDQ  D:  06/17/2009  T:  06/18/2009  Job:  [id number]   cc:   Ritta Slot, MD

## 2010-06-29 NOTE — Assessment & Plan Note (Signed)
OFFICE VISIT   AUGUST, GOSSER  DOB:  1929-05-20                                        May 20, 2009  CHART #:  [id number]   CURRENT PROBLEMS:  1. Status post mitral valve replacement with a bioprosthetic valve      using chordal preservation, maze procedure February 2011.  2. Hypertension.  3. Familial tremor.   PRESENT ILLNESS:  The patient returned for a office visit, now 6 weeks  after mitral valve replacement and maze procedure for chronic atrial  fibrillation.  She has a permanent pacemaker for prior history of sick  sinus syndrome.  She is at home and receiving home physical therapy and  making progress with her overall strength and activity levels.  She has  not fallen and she has had no symptoms of recurrent CHF or atrial  arrhythmia.  She has not been on Coumadin due to her history of fall  risk.  She is stronger, now is ready to transition over to the hospital-  based cardiac rehab program, but is not ready to drive independently.  Her home oxygen has been discontinued and her incisions are all well  healed.  On her last visit 2 weeks ago, she was given an extra diuretic  for some ankle edema and this has now resolved.   CURRENT MEDICATIONS:  1. Aspirin 81 mg b.i.d.  2. Prilosec.  3. Zocor.  4. Cardizem CD 180 mg b.i.d.  5. Multivitamin.  6. Digoxin 0.125 mg a day.  7. Bystolic 5 mg daily.   PHYSICAL EXAMINATION:  Vital Signs:  Blood pressure 130/60, pulse 70,  respirations 18, and saturation 94%.  General:  She appears very alert  and stronger today.  She has somewhat of a flat affect and a tremor.  Lungs:  Breath sounds are clear and equal.  The sternum is stable and  well healed.  Cardiac:  She has no murmur and a regular rhythm.  Extremities:  Her pedal edema has resolved.   IMPRESSION AND PLAN:  The patient is now much stronger and ready to  start cardiac rehab, which we will arrange at the [facility].  No changes in her  medications were made and she was encouraged to walk  daily and to keep the leg elevated as much as possible.  She was  encouraged to give her best effort at cardiac rehab and to expect her  recovery to take up to 4-6 months due to her deconditioned status  preoperatively.  I will see her back in approximately 3 weeks' time.   Kerin Perna, M.D.  Electronically Signed   PV/MEDQ  D:  05/20/2009  T:  05/21/2009  Job:  [id number]   cc:   Ritta Slot, MD

## 2010-06-29 NOTE — H&P (Signed)
[name], BETH NO.:  [id number]   MEDICAL RECORD NO.:  [id number]          PATIENT TYPE:  INP   LOCATION:  2032                         FACILITY:  [facility]   PHYSICIAN:  Madaline Savage, M.D.DATE OF BIRTH:  [birth year]   DATE OF ADMISSION:  02/12/2007  DATE OF DISCHARGE:                              HISTORY & PHYSICAL   CHIEF COMPLAINT:  Shortness of breath.   HISTORY OF PRESENT ILLNESS:  [name] is a 77 year old female with a  history of PAF.  She had been in sinus rhythm on Rythmol June of 2008.  She has had remote normal coronaries at catheterization and a  nonischemic nuclear study in 2001.  She has a low normal LV function by  echocardiogram in 2006.  Her last office visit was June in 2008.  She  had been doing well until a few days before Christmas when she  contracted a cough.  She had been treated by her primary care doctor  with Mucinex.  This morning she apparently fell.  She went to her  primary care doctor and was weak and had a cough and was sent to the  emergency room.  Telemetry in the ER shows atrial fibrillation.  She is  in mild respiratory distress.  She denies chest pain.  She is unaware of  her heart rate being in atrial fibrillation.   PAST MEDICAL HISTORY:  As above, she had pneumonia several years ago and  was hospitalized for a month.  She has had frequent episodes of  bronchitis.  She has a baseline tremor.   CURRENT MEDICATIONS:  1. Zocor 20 mg a day.  2. Rythmol 150 mg t.i.d.  3. Maxzide daily.  4. Aspirin 81 mg a day.  5. Lanoxin 0.125 mg a day.  6. Omeprazole 20 mg a day.  7. Cartia 240 daily.  8. Alprazolam 0.5 q.8 h p.r.n.   ALLERGIES:  She has no known drug allergies.   SOCIAL HISTORY:  She never smoked.  She is a retired Runner, broadcasting/film/video.  She is  married with 2 children, 3 grandchildren.   FAMILY HISTORY:  Is unremarkable.   REVIEW OF SYSTEMS:  She has had some lower extremity edema.  No chest  pain.  No hemoptysis.   No fever or chills.   PHYSICAL EXAM:  VITAL SIGNS:  Blood pressure 120/70, pulse 118,  temperature 100.4.  GENERAL:  She is an elderly frail female complaining of cough.  HEENT:  Normocephalic.  Extraocular movements are intact.  She has a  neck brace in place from her fall.  NECK:  Was without obvious abnormality.  She does have a neck brace in  place.  CHEST:  Reveals diffuse rhonchi bilaterally.  CARDIAC:  Reveals irregularly irregular rhythm.  ABDOMEN:  Nontender.  No hepatosplenomegaly.  EXTREMITIES:  Reveal no edema.  NEUROLOGICAL:  Exam grossly intact.  She is awake, alert and oriented,  cooperative.   Chest x-ray shows cardiomegaly per ER physician report.  EKG shows  atrial fib with diffuse ST changes.  Creatinine is 2.0.  White count  9.6, hemoglobin  13.7.   IMPRESSION:  1. Pneumonia.  2. Breakthrough atrial fibrillation.  3. Question mild superimposed heart failure.  4. Hypotension, the patient did receive a liter of fluid in the ER for      hypotension.  5. Renal insufficiency.   PLAN:  The patient will be admitted to telemetry.  We will start her on  IV Rocephin.  We will hold on Lasix.      [name], P.A.    ______________________________  Madaline Savage, M.D.    [name]  D:  02/24/2007  T:  02/24/2007  Job:  [id number]

## 2010-06-29 NOTE — Op Note (Signed)
NAMEHEILEY, SHAIKH NO.:  [id number]   MEDICAL RECORD NO.:  [id number]          PATIENT TYPE:  INP   LOCATION:  2032                         FACILITY:  [facility]   PHYSICIAN:  Darlin Priestly, MD  DATE OF BIRTH:  [birth year]   DATE OF PROCEDURE:  02/26/2007  DATE OF DISCHARGE:                               OPERATIVE REPORT   PROCEDURE:  Insertion of a Medtronic Adapta ADDR01 generator, serial  number [id number] with passive ventricular and active atrial leads.   ATTENDING PHYSICIAN:  Darlin Priestly, MD, Ritta Slot, MD   COMPLICATIONS:  None.   INDICATIONS:  Ms. Apsey is a 77 year old female patient of Dr. Lavonne Chick with a history of paroxysmal atrial fibrillation, history of  normal coronaries by cath with last EF noted to be normal in 2006.  She  was admitted to hospital on February 12, 2007 with increasing shortness  of breath.  She was noted at that time to have pneumonia with mild  superimposed heart failure as well as A fib with rapid ventricular  response.  With medication adjustment, she did become bradycardic.  Her  Rythmol was subsequently discontinued.  She did slowly have increasing  doses of Diltiazem to control her heart rates though they were still  poorly controlled in the 100 to 130 range when ambulating.  We did again  try to initiate low-dose beta blocker; however, she became bradycardic  again.  She is now referred for dual-chamber pacer implant to allow  treatment of her A fib and sick sinus syndrome.   DESCRIPTION OPERATION:  After informed consent, the patient was brought  to the cardiac cath lab.  Her left chest was prepped and draped in  sterile fashion.  ECG monitoring was established.  1% lidocaine was used  to anesthetize the left mid subclavicular area.  Next a 23-cm horizontal  mid infraclavicular incision was then carried out and hemostasis  obtained with electrocautery.  Blunt dissection used to carry this down  to  left pectoral fascia.  Next, 3-4 cm pocket was created over the left  pectoral fascia.  Again hemostasis obtained with electrocautery.  Left  subclavian vein was then easily entered with two retained guidewires in  the left subclavian vein.  Four silk sutures then placed in the base of  the retained guidewires into the pectoral fascia.  Over the first  retained guidewire a #7-French dilator and sheath then easily passed to  the left subclavian vein and the dilator and guide were removed.  Through this a 52-cm passive Medtronic lead model number [id number], serial  number [id number] was then passed into right atrium.  Peel-away sheath  removed.  Over the second retained guidewire a second 7-French dilator  sheath then easily passed left subclavian vein and guidewire and dilator  removed.  Through this sheath a 45-cm active atrial lead model number  [id number] serial [id number] then passed into right atrium.  Peel-away sheath  removed.  A J curve was then placed in ventricular lead stylet and  ventricle lead was allowed to prolapse to the tricuspid  valve and  positioned in the RV apex without difficulty.  Thresholds were then  determined.  R-waves measured at 12.2 millivolts.  Impedance 889 ohms.  Threshold in ventricle was 0.6 volts at 0.5 milliseconds.  Current 0.9  mA.  10 volts negative for diaphragmatic stimulation.  The preformed  atrial stylet was then positioned in the atrial lead.  The atrial lead  was then positioned the area of the right atrial appendage.  We were  able to sense fibrillation at approximately 1 millivolts.  The screw was  then extended.  Again P-waves were then measured 1.2 millivolts.  Impedance 640 ohms.  There is no diaphragmatic stimulation at 10 volts.  The leads were then anchored in place with two silk sutures per lead to  anchor these to the pectoral fascia.  The pocket was then copiously  irrigated with 1% kanamycin solution.  Again hemostasis was confirmed.  The  leads were connected in serial fashion to a Medtronic Adapta ADDR01  generator serial number [id number].  Head screws were tightened and  pacing was confirmed.  A single silk suture was were placed in superior  aspect pocket.  The generator and leads then delivered to the pocket.  Header secured to the silk suture.  Subcutaneous layers were closed  using 2-0 Vicryl.  The skin was then closed using 4-0 Vicryl.  Steri-  Strips were applied.  The patient returned to recovery room in stable  condition.   CONCLUSIONS:  Successful implant of a Medtronic Adapta [id number] generator  serial [id number] with passive ventricular and active atrial leads.    Dr. Howell Pringle the patient technique      Darlin Priestly, MD  Electronically Signed     RHM/MEDQ  D:  02/26/2007  T:  02/27/2007  Job:  [id number]   cc:   Madaline Savage, M.D.

## 2010-06-29 NOTE — Assessment & Plan Note (Signed)
OFFICE VISIT   ANGINETTE, ESPEJO  DOB:  02/16/1929                                        July 27, 2009  CHART #:  [id number]   CURRENT PROBLEMS:  1. Status post mitral valve replacement with chordal preservation with      a bioprosthetic valve and left-sided maze procedure in February      2011.  2. Chronic atrial fibrillation.   PRESENT ILLNESS:  The patient returns for final surgical followup after  undergoing mitral valve replacement for severe mitral regurgitation and  a left-sided maze procedure for chronic atrial fibrillation in mid  February.  She continues to do well and is at a functional level at  home.  She is going to the outpatient rehab 3 days a week and is ready  to resume driving.  She has no symptoms of CHF or fluid retention.  She  is still in a controlled atrial fibrillation with a heart rate between  80 and 90.  She is not on Coumadin, but she did have a left atrial  appendage ligation to protect her from embolic stroke at the time of the  maze procedure.  Her incisions are all well healed.  She still uses a  walker just for confidence and she has not had any falls since surgery.   CURRENT MEDICATIONS:  1. Aspirin 81 mg.  2. Prilosec.  3. Zocor.  4. Cardizem CD 180 mg p.o. daily.  5. Lasix 40 mg a day.  6. Lopressor 25 mg b.i.d.  7. Multivitamin.   PHYSICAL EXAMINATION:  Blood pressure 130/70, pulse 80 and a controlled  atrial fibrillation, respirations 18, saturation on room air 95%.  She  is alert and comfortable and very oriented.  Breath sounds are clear and  equal.  Cardiac rhythm is slightly irregular.  There is no significant  cardiac murmur.  She has no significant peripheral edema.  Overall  strength is good and she ambulates well down the hallway in the office.   IMPRESSION AND PLAN:  The patient has done well now almost 4 months  after surgery and she is ready to resume driving.  She is complaining of  significant  bruising in her forearms from the aspirin and we will cut  the aspirin from 162-81 mg a day.  I discussed specifically with her  that with the ligation atrial appendage and risk for stroke for chronic  atrial fibrillation should be significantly decreased.  She appears to  have recovered extremely well from the operation and will return to the  care of her primary care physician, Dr. Tenny Craw, and her cardiologist, Dr.  Lynnea Ferrier, and return here as needed.   Kerin Perna, M.D.  Electronically Signed   PV/MEDQ  D:  07/27/2009  T:  07/28/2009  Job:  [id number]   cc:   Ritta Slot, MD  C. Duane Lope, M.D.

## 2010-06-29 NOTE — Assessment & Plan Note (Signed)
OFFICE VISIT   NOELENE, GANG  DOB:  08/23/1929                                        May 06, 2009  CHART #:  [id number]   CURRENT PROBLEMS:  1. Status post mitral valve replacement with a bioprosthetic valve      using chordal preservation with combined maze procedure, April 03, 2009.  2. Hypertension.  3. Hyperlipidemia.  4. Familial tremor and deconditioning.   PRESENT ILLNESS:  The patient is a very nice 77 year old fragile  Caucasian female who returns for a first office visit after mitral valve  replacement with a bioprosthetic valve and combined maze procedure for  preoperative atrial fibrillation.  She initially went home to [facility], but now is at home with her family and doing much  better.  Her strength is improving.  She is walking 3 times a week with  home physical therapy.  She has no symptoms of CHF and the surgical  incisions are healing well.  She has not used her home oxygen in several  days and we will discontinue the oxygen supply at home.  Her heart  rhythm has been stable and she has a previously placed permanent AV  sequential pacemaker.  She has had no falls at home and her mental  status has significantly improved in her home familiar environment.   CURRENT MEDICATIONS:  Aspirin, Prilosec, Zocor, diltiazem 180 b.i.d.,  Lasix 40 mg b.i.d., iron, potassium, tramadol, Xanax p.r.n. at bedtime,  and albuterol inhaler.  She is also for some reason on both Bystolic and  metoprolol and we will stop the metoprolol as she has been taking  Bystolic long-term.   PHYSICAL EXAMINATION:  Vital Signs:  Blood pressure 120/80, pulse 86 and  regular, respirations 18, and saturation 94% on room air.  General:  She  is alert, oriented, and appears very comfortable at rest.  Lungs:  Breath sounds are clear and equal.  The sternal incision is well healed.  Cardiac:  Rhythm is regular.  There is no murmur of MR.   Extremities:  She has 1-2+ ankle edema with warm extremities.  Neurologic:  Intact and  she walks with a rolling walker fairly well.   A PA and lateral chest x-ray shows improvement in bilateral lung  aeration with a small residual left base atelectasis.  There is no  significant edema or effusion.  The sternal wires are aligned and  intact.   IMPRESSION AND PLAN:  The patient is doing well with home therapy.  This  will continue until it is maximized.  At that time, we can consider  transition over to a hospital-based cardiac rehab program, which she in  fact has participated in previously.  She is able to do some light  lifting now up to 5 or 10 pounds around the house.  She can be left home  alone for a period of time, but she is not ready to resume driving or  any exertional activities other than walking.  I have asked her to stop  her metoprolol and not renew her iron tablets when the prescription runs  out and I feel no need for further albuterol or home oxygen.  For her  ankle edema, I have given her 3 days of Zaroxolyn 5 mg a day to assist  with diuresis.  I will plan on seeing her back for a recheck in 2 weeks.   Kerin Perna, M.D.  Electronically Signed   PV/MEDQ  D:  05/06/2009  T:  05/07/2009  Job:  [id number]   cc:   Ritta Slot, MD  C. Duane Lope, M.D.

## 2010-06-29 NOTE — Discharge Summary (Signed)
NAMEMAYETTA, CASTLEMAN NO.:  [id number]   MEDICAL RECORD NO.:  [id number]          PATIENT TYPE:  INP   LOCATION:  2032                         FACILITY:  [facility]   PHYSICIAN:  Abelino Derrick, P.A.   DATE OF BIRTH:  01-18-1930   DATE OF ADMISSION:  02/12/2007  DATE OF DISCHARGE:  02/28/2007                               DISCHARGE SUMMARY   DISCHARGE DIAGNOSES:  1. Pneumonia treated this admission.  2. Atrial fibrillation with sick sinus syndrome, Medtronic pacemaker      implant this admission.  3. History of falls, not a Coumadin candidate.  4. Chronic essential tremor.  5. Remote normal coronaries with negative nuclear study in 2001.  6. Preserved left ventricular function by echocardiogram.   HOSPITAL COURSE:  The patient is a 77 year old female followed by Dr.  Elsie Lincoln and Dr. Donovan Kail with a history of PAF.  She had been on  Rythmol and was in sinus rhythm in June 2008.  She has had a remote  normal cath, and then had a follow-up nuclear study in 2001 that was low  risk.  She has normal LV function bypass echo.  She was admitted  February 12, 2007 with cough.  She was felt to have pneumonia.  She was  in atrial fibrillation, although she was unaware of this.  She also had  dehydration with a creatinine of 2 on admission, BUN 30.  She was  admitted and started on IV heparin.  She was seen in consult by the  pulmonary service, and started on steroids and antibiotics.  Dr. Elsie Lincoln  who knows her well did not feel she was a Coumadin candidate because of  a history of multiple falls.  Echocardiogram was done here which  revealed preserved LV function.  She did have significant hypoxemia and  has baseline COPD.  Her rate for atrial flutter was somewhat difficult  to control.  She did have periodic bradycardia episodes.  She was  transferred to telemetry, and we tried to adjust her medications for  rate control and immobilize her.  At rest her rate was okay, but  when  she ambulated with physical therapy her rate went up into the 130's, and  she was symptomatic with this.  She then started having greater than 2  second pauses.  Ultimately it was decided to proceed with implant of  pacemaker prior to discharge.  This was done February 26, 2007 by Dr.  Jenne Campus.  She had a Medtronic device implanted.  We feel she can be  discharged February 28, 2007.  We will check her O2 sats on room air and  with ambulation, and she will be set up for home oxygen if needed.  She  has required 2 liters of O2 throughout this admission.  As noted above  she is not a Coumadin candidate because of a history of multiple falls.   LABORATORIES AT DISCHARGE:  Chest x-ray on the 13th shows pacer present,  no pneumothorax, and improved aeration of the left lung base.   Last BUN and creatinine were on February 25, 2007.  Sodium 137, potassium  4.7, BUN 26, creatinine 0.98.  White count 11.9, hemoglobin 11.3,  hematocrit 34.4, platelets 189.  She is in atrial fibrillation with  ventricular pacing at discharge.   DISCHARGE MEDICATIONS:  1. Bistolic 5 mg daily.  2. Protonix or Prilosec daily.  3. Simvastatin 20 mg a day.  4. Aspirin 81 mg a day.  5. Humibid LA 1200 mg b.i.d.  6. KCl 20 mEq a day.  7. Advair 1 puff b.i.d.  8. Lanoxin 0.125 mg a day.  9. Diltiazem 360 mg a day.  10.Prednisone tapering dose.  She will start 20 mg daily for two days      on the 15th, then 10 mg daily for two days on the 18th, and then      she will stop.   DISPOSITION:  The patient is discharged in stable condition, and will  have a wound check in a week in the office.  Home O2 will be arranged.      Abelino Derrick, P.A.    ______________________________  Abelino Derrick, P.ALenard Lance  D:  02/28/2007  T:  02/28/2007  Job:  [id number]   cc:   Miguel Aschoff, M.D.

## 2010-06-29 NOTE — Consult Note (Signed)
NEW PATIENT CONSULTATION   [name], [name]  DOB:  [birth year]                                        March 18, 2009  CHART #:  [id number]   PHYSICIAN REQUESTING CONSULTATION:  Ritta Slot, MD   PRIMARY CARE:  C. Duane Lope, MD, [facility].   REASON FOR CONSULTATION:  Severe MR, moderate TR, atrial fibrillation,  class III CHF.   HISTORY OF PRESENT ILLNESS:  I was asked to evaluate this 77 year old  Caucasian female nonsmoker for possible mitral valve repair, tricuspid  valve annuloplasty, and maze procedure for recent diagnosis of severe  mitral regurgitation with moderate TR and permanent AFib for the past 2  years, treated with a pacemaker.  The patient had a cath in 2004 at  which time she had nonsignificant coronary disease and had mild mitral  regurgitation with some prolapse of the posterior leaflet.  She has been  followed at [facility] carefully over the years  and has been noted to have increasing mitral regurgitation over the past  12-18 months.  A study in late 2010 at the office indicated moderate-to-  severe mitral regurgitation with EF of 55% and moderate tricuspid  regurgitation.  The patient clinically has noted decreased exercise  tolerance as well as ankle swelling and dyspnea on exertion.  She  underwent a transesophageal echo in January 2011 by Dr. Lynnea Ferrier which  clearly documented severe regurgitation with bileaflet prolapse,  moderate TR, severe left atrial dilatation, 1 + AI, and preserved LV  systolic function.  She is scheduled for right and left heart cath in  the near future.   The patient had been treated medically for paroxysmal atrial  fibrillation for several years, however, required a permanent pacemaker  placement in 2009 for tachy-brady syndrome, and the patient has been in  permanent atrial fibrillation for the past 2 years.  She has not been on  Coumadin due to concern of the risk of  her falling and  some confusion  on her part  in the past with her medications.   Functionally, the patient is able to carry on normal household  activities.  She walks with her husband almost every morning for an hour  at the church gymnasium.  She has very little resting symptoms other  than ankle edema.  She is not felt to be a Coumadin candidate because of  her risk for fall. However, she has not had any falls or broken bones.   The patient's medical therapy has been augmented by Dr. Lynnea Ferrier who has  increased her Lasix dose and has added losartan.   CURRENT MEDICATIONS:  1. Zocor 20 mg daily.  2. Aspirin 81 mg daily.  3. Xanax 0.5 mg p.r.n.  4. Bystolic 5 mg daily.  5. Omeprazole 20 mg daily.  6. Maxzide 37.5/25 mg daily.  7. Diltiazem 360 mg daily.  8. Fosamax weekly.   ALLERGIES:  None.   SOCIAL HISTORY:  The patient is married with children and grandchildren.  She walks daily.  She does not drink alcohol or smoke tobacco.   REVIEW OF SYSTEMS:  CONSTITUTIONAL:  Negative fever or weight loss.  ENT:  Significant for upper full dental plate.  She has a partial plate  on the mandible.  She has active dental disease and is scheduled to  have  a root canal and crown in the very near future which she will take care  before her elective valve surgery.  She denies any difficulty  swallowing.  THORACIC:  Negative for history of trauma.  Her chest x-ray shows  cardiomegaly but no intrinsic lung disease.  Her pulmonary function  spirometry in the office today indicates FEV-1 of 1.7, FVC of 2.0, both  approximately 80% of predicted.  GI:  Negative for hepatitis, jaundice, or blood per rectum.  She has had  a hysterectomy in the past.  EXTREMITIES:  No clubbing, cyanosis.  Positive for ankle edema.  No  history of DVT.  VASCULAR:  Negative for TIA or a stroke.  Carotids in the past have been  unremarkable.  She does have a slight tremor which has been slightly  progressive.  In  late December, she was placed on a Z-Pak for upper  respiratory infection, but she denies history of the flu this winter.  NEUROLOGIC: She has an essential familial tremor evauated in the past by  Dr. Sharene Skeans, also mild organic brain syndrome.   PHYSICAL EXAMINATION:  Vital Signs:  She is 5 feet 7 inches, weighs 164  pounds.  Blood pressure 133/78, pulse 64 and regular, respirations 18,  saturation 95%.  General:  Appearance was that of an elderly, somewhat  frail-appearing female with a resting tremor but in no acute distress  who is alert, responsive, and appropriate.  HEENT:  Upper dental plate.  Pupils are equal.  Neck:  Without JVD or mass.  Lymphatics:  No palpable  cervical or supraclavicular adenopathy.  Breath sounds are clear.  There  is no thoracic deformity.  She has a pacemaker in the left  infraclavicular area.  There is no protrusion or threat of erosion.  Cardiac:  An irregular rhythm.  There is a grade 2/6 systolic murmur.  Extremities:  Peripheral pulses are strong, 2+ in all extremities.  She  has mild ankle edema.  Abdomen:  Without pulsatile mass, tenderness, or  organomegaly.  Neurologic:  Without focal deficit.  I walked her up and  down the hallway and she did fine.   LABORATORY DATA:  I reviewed her recent TEE, chest x-ray, and her most  recent echo reports.  She has clearly had progression in her severe  mitral regurgitation with associated signs and symptoms of advancing  CHF.   PLAN:  She would benefit from mitral valve and tricuspid repair with  combined maze procedure.  Prior to surgery, she will need a cath to  define her coronary status and to evaluate her right heart pressures and  cardiac output.  She also will contact her personal dentist for moving  up for scheduled root canal and crown procedure.   The patient was discussed with Dr. Lynnea Ferrier who will arrange a left and  right heart cath after the patient's dental procedures are completed,  and at  that time of surgery will be scheduled.   Thank you for the consultation.   Kerin Perna, M.D.  Electronically Signed   PV/MEDQ  D:  03/18/2009  T:  03/19/2009  Job:  [id number]

## 2010-07-02 NOTE — Consult Note (Signed)
[facility]  Patient:    [name], [name] Visit Number: [id number] MRN: [id number]          Service Type: MED Location: [phone] Attending Physician:  Ophelia Shoulder Dictated by:   Deanna Artis. Sharene Skeans, M.D. Proc. Date: 03/06/01 Admit Date:  03/06/2001   CC:         Madaline Savage, M.D.   Consultation Report  DATE OF BIRTH:  22-Jan-1930  CHIEF COMPLAINT:  Tremor.  HISTORY OF PRESENT CONDITION:  I was asked by Dr. Jacinto Halim covering for Dr. Elsie Lincoln to evaluate this 77 year old woman who has had a 12-14 year history of tremor for Parkinsons disease.  The patient was admitted to the hospital with paroxysmal atrial fibrillation, palpitations, and lightheadedness.  She had had symptoms for four or five days that had become continuous.  Her symptoms improved when she would lie down and when she got up and was active she would have rapid response atrial fibrillation that on one occasion cleared within a couple of minutes while an EKG was being run.  The patient was seen by Dr. Lavonne Chick who noted that she had had significant confusion with taking her medicines in the past and declined to place her on Coumadin despite the presence of the atrial fibrillation.  In addition, the patient has had hypertension that has not been fully treated. She also has osteoporosis.  She has had previous cardiac catheterization that showed normal left ventricular function and no significant vascular occlusion.  I was asked to see the patient when she was noted by Dr. Jacinto Halim to have tremors in her hand and her jaw.  He requested that I evaluate the presence of Parkinsons and noted that she had problems with confusion and wondered about the possibility of a dementing syndrome.  PAST MEDICAL HISTORY:  See above.  PAST SURGICAL HISTORY:  Hysterectomy and tonsillectomy.  REVIEW OF SYSTEMS:  No history of thyroid disease, diabetes mellitus,  or dyslipidemia.  The patient has had significant problems with palpitations and has not responded to a number of medications including many beta blockers.  Patient has not had fever or other constitutional signs and symptoms. Appetite has been good.  She has had normal sleep.  Her tremor has been fairly stable over a number of years.  She has not developed problems with bradykinesia, pill rolling tremor, dysphonia, or dysphagia.  MEDICATIONS: 1. Lanoxin 0.125 mg q.d. 2. Diovan 160 mg q.d. 3. Propafenone 150 mg one and a half t.i.d. 4. Baby aspirin 81 mg q.d. 5. Altace 5 mg b.i.d. 6. Xanax 1 mg t.i.d. 7. Diltiazem XT 240 q.d. 8. Prevacid 30 mg q.d.  ALLERGIES:  None.  FAMILY HISTORY:  Both parents are deceased.  The patients mother had an essential tremor.  There was a family history of hypertension, heart disease.  SOCIAL HISTORY:  The patient is married.  She has two children.  She is not a smoker.  Her husband has been a constant companion and has helped to keep medications straight.  PHYSICAL EXAMINATION  GENERAL:  Well-developed, well-nourished woman in no acute distress.  VITAL SIGNS:  Blood pressure 136/74, resting pulse 66, respirations 20, temperature 98.7, pulse oximetry 91% on room air.  HEENT:  No signs of infection.  NECK:  No bruits.  LUNGS:  Clear to auscultation.  HEART:  No murmurs.  Pulses normal.  ABDOMEN:  Soft.  Bowel sounds normal.  No hepatosplenomegaly.  EXTREMITIES:  No edema,  cyanosis.  She has many bruises.  NEUROLOGIC:  Awake, alert.  No dysphasia or dyspraxia.  Patient asks the same question twice often.  Mini Mental Status was not done but I can tell she has some problems with memory.  Cranial nerves:  Round, reactive pupils.  Fundi show a scar at 7 oclock in the right eye.  No afferent defect.  She has sharp disk margins.  Visual fields full to double simultaneous stimuli.  Okay end responses equal.  Symmetric facial strength.   Midline tongue and uvula.  Air conduction greater than bone conduction bilaterally.  Motor examination:  Patient has essential tremor of the jaw, hands, and fingers.  Strength is normal except for hip flexors which are 4/5.  There is no pronator drift.  Fine motor movements were okay and tended to decrease her tremor.  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 She has good stereoagnosis.  Cerebellar examination:  Finger-to-nose and rapid repetitive movements were okay.  She walked with a nice arm swing and took two to three steps to pivot and turn.  She can get up on her heels and her toes without falling.  Tandem is okay.  Deep tendon reflexes are diminished ______ they are absent.  She had bilateral flexor plantar responses.  No frontal lobe release signs.  IMPRESSION: 1. Benign essential tremor of the hands, jaw, and fingers-familial (333.1). 2. 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 3. Paroxysmal atrial fibrillation. 4. Hypertension.  MEDICAL DECISION MAKING:  The patient does not show rigidity, bradykinesia, cogwheeling, mask like faces, pill rolling tremor, dysphonia, or dysphagia which are cardinal symptoms of Parkinsons.  In addition, she has had her symptoms for about 12-14 years during which time she certainly would have progressed with her symptoms.  Finally, there is a positive family history in her mother.  RECOMMENDATIONS:  I would observe the patient without treatment.  Beta blockers like propranolol can be effective to treat essential tremor but they are not indicated for her arrhythmia.  The patient already has low heart rate and apparently beta blocker drugs would have not been effective in controlling her arrhythmias.  Mysoline and Topamax come from anti-epileptic drugs and can be somewhat sedative and can cause confusion which would worsen her organic brain syndrome.  I will be happy to discuss this further with Dr.  Elsie Lincoln at his request.  I do  not believe that there is any further testing that is necessary in this condition. Dictated by:   Deanna Artis. Sharene Skeans, M.D. Attending Physician:  Ophelia Shoulder DD:  03/06/01 TD:  03/07/01 Job: [id number]

## 2010-07-02 NOTE — Procedures (Signed)
[facility]  Patient:    [name], [name]                       MRN: [id number] Proc. Date: 10/05/99 Adm. Date:  [id number] Attending:  Deneen Harts CC:         Charles A. Tenny Craw, M.D.   Procedure Report  PROCEDURE:  Panendoscopy, Savary dilation, biopsy.  INDICATIONS FOR PROCEDURE:  Ms. [name] is a 77 year old white female undergoing endoscopy because of her current symptoms of dysphagia. Last examined April 2001 at which time endoscopy with esophageal dilation completed. She did well for several months but over the past month has noted recurrent solid food dysphagia. She continues to use proton pump inhibitor therapy with Prevacid 30 mg daily. Risk factors include daily fosamax and Vioxx.  DESCRIPTION OF PROCEDURE:  After reviewing the nature of the procedure with the patient including the potential risks of hemorrhage and perforation, informed consent was signed. In the fluoroscopy suite, the patient was premedicated with topical anesthetic followed by IV sedation totaling Versed 10 mg, fentanyl 100 mcg administered IV in divided doses prior to the onset of the procedure.  Using an Olympus video endoscope, the proximal esophagus was intubated under direct vision. The oropharynx was normal without lesion of the epiglottis, vocal cords or piriform sinus. The proximal, mid and distal segments of the esophagus were normal to the mucosal Z line at 38 cm. A Schatzki ring was again apparent with moderate resistance to passage of the endoscope. The ring appeared somewhat thickened and could well be a reflux induced stricture. Hiatal hernia extends from 34 to 42 cm. No evidence of inflammation or infiltration.  The gastric fundus and body were normal. Antrum remarkable for persistent erythema. This was biopsied for Helicobacter.  The pylorus was symmetric and easily traversed. The duodenal bulb and second portion were normal. Retroflexed view of the  angularis, lesser curve, gastric cardia and fundus confirmed the hiatal hernia defect noted above. No additional lesions identified.  A Savary guidewire was laid along the greater curve. Scope withdrawn while maintaining position of the wire under fluoroscopic control. Savary dilators were then sequentially passed with 17, 18, 19 and 20 mm diameter dilator.  The guidewire was withdrawn along with the final dilator.  Before Savary dilation was initiated, biopsies were obtained from the angularis and prepyloric antrum for Helicobacter.  The patient was maintained on low flow oxygen and datascope monitor throughout. Remained stable throughout. Returned to recovery in excellent condition.  ASSESSMENT:  1. Schatzki ring versus distal esophageal reflux induced stricture--Savary     dilation 17-20 mm dilators.  2. Moderate hiatal hernia.  3. Gastritis--Helicobacter biopsy pending.  RECOMMENDATIONS:  1. Treat if Helicobacter positive.  2. Antireflux measures reviewed.  3. Consider discontinuing or decreasing fosamax, Vioxx therapy.  4. Return office visit in 1 month--if symptoms have recurred perform barium     swallow with barium tablet.  5. Repeat endoscopic evaluation with Savary dilation p.r.n. DD:  10/05/99 TD:  10/06/99 Job: [id number]

## 2010-07-02 NOTE — Discharge Summary (Signed)
NAME:  [name], [name]                          ACCOUNT NO.:  [id number]   MEDICAL RECORD NO.:  [id number]                   PATIENT TYPE:  INP   LOCATION:  4738                                 FACILITY:  [facility]   PHYSICIAN:  Madaline Savage, M.D.             DATE OF BIRTH:  03/23/1929   DATE OF ADMISSION:  01/31/2002  DATE OF DISCHARGE:  02/02/2002                                 DISCHARGE SUMMARY   HISTORY:  The patient is a 77 year old white married female patient of Dr.  Truett Perna with history of  paroxysmal atrial fibrillation.  She came to the  [facility]  emergency room with an irregular heart beat that  apparently had started the night previously.  She felt weak but was able to  continue doing her regular activities.  She does have a history of  paroxysmal atrial fibrillation.  She has not been a candidate for Coumadin  in the past secondary to frequent falls.  She has been tried on beta blocker  in the past, but apparently she became bradycardia with both labetalol,  Visken; she was tried on a dose of Rythmol.  When her dose was increased she  became symptomatic with that. Heart rate has been in the 40s before.  Thus,  she came into the hospital. She was seen initially by Dr.  Tresa Endo.  Her  original rate was atrial fibrillation of 120.  After she received 10 mg  bolus of Cardizem, it came down to 90. She had no chest pain with this and  EKG had shown atrial fibrillation. Dr. Tresa Endo thought reconsideration of  Coumadin would be needed to decrease her risk of thromboembolism.  She was  seen by Dr. Alanda Amass on 02/01/2002. It was decided that she not be on  Coumadin secondary to increased falls.  She was started on amiodarone 200 mg  b.i.d.. She was continued on her intravenous heparin.  On 02/02/2002, she  was seen by Dr. Elsie Lincoln.  He decided that she could be discharged home.  He  also recommended she was not a Coumadin candidate.  His plan was to send her  home on  amiodarone 200 mg twice a day for three weeks, and then 200 mg per  day after that.  Also, she should be on aspirin and Plavix.  Also, to note  she was on Cardizem 240 on admission and it was decreased down to 180 with  the addition of the amiodarone.   On the day of discharge, her blood pressure was about 121/41, heart rate  about 50.  Monitors showed heart rate anywhere from 63 to 50, sinus rhythm.  She apparently converted to sinus rhythm in the emergency room.   LABORATORY DATA:  TSH 2.768, sodium 140, potassium 3.6, BUN 15, creatinine  0.8.  WBCs 5.1, hemoglobin 13.6, hematocrit 40.1, platelet count 138,000.  Chest x-ray showed cardiomegaly,elevation of the left hemidiaphragm.  No  significant disease.  A 3 mm calcified granuloma at the lateral aspect of  the right lung which apparently has been present in the past.   DISCHARGE MEDICATIONS:  1. Digoxin 0.125 mg once per day  2. Aspirin 81 mg once per day  3. Al loprazolam 0.5 mg two times per day  4. Zocor 20 mg at bedtime  5. Plavix 75 mg once per day  6. Diltiazem 180 mg per day  7. Diazide 160 mg once per day  8. Prevacid 30 mg once per day  9. Amiodarone 200 mg twice per day times three weeks, and then 200 mg once     per day  10.      She is to stop her propafenone and Fosamax as taking previously.   ACTIVITY:  As tolerated.   DIET:  She should be on a 2 gram sodium, low saturated fat diet.   FOLLOW UP:  She will followup with Dr. Elsie Lincoln in two to three weeks.  She  will call our office on Monday to make an appointment.   DISCHARGE DIAGNOSES:  1. Proximal atrial fibrillation with prior history of  this and problems     with sinus bradycardia on beta blocker, labetalol, and Visken.  Also,     with symptoms on increased Rythmol dose; thus, during this     hospitalization her Rythmol was discontinued and she was placed on     amiodarone.  2. Anticoagulation. She is not a Coumadin candidate secondary to some falls      and previous problems with memory loss.  It was decided to put her on     aspirin and Plavix.  3. Catheterization in 2001 showed patent coronaries.  4. Normal ejection fraction of 68%.  5. Dyslipidemia.  6. Hypertension, controlled.     Lezlie Octave, N.P.                        Madaline Savage, M.D.    BB/MEDQ  D:  02/02/2002  T:  02/04/2002  Job:  [id number]   cc:   Caryn Bee L. Little, M.D.  [address]  [place]  [place] [address]  Fax: [phone]

## 2010-07-02 NOTE — Discharge Summary (Signed)
NAME:  [name], [name]                          ACCOUNT NO.:  [id number]   MEDICAL RECORD NO.:  [id number]                   PATIENT TYPE:  INP   LOCATION:  3743                                 FACILITY:  [facility]   PHYSICIAN:  Madaline Savage, M.D.             DATE OF BIRTH:  1929-04-01   DATE OF ADMISSION:  02/04/2002  DATE OF DISCHARGE:  02/06/2002                                 DISCHARGE SUMMARY   DISCHARGE DIAGNOSES:  1. Paroxysmal atrial fibrillation with tachycardia.  2. Tachybrady syndrome.  3. Anxiety.   DISCHARGE DISPOSITION:  Improved.   PROCEDURE:  None.   DISCHARGE MEDICATIONS:  1. Lanoxin 0.125 mg daily.  2. Aspirin 81 mg daily.  3. Plavix 75 mg daily.  4. Alprazolam 0.5 mg.  5. Zocor 20 mg at bedtime.  6. Diovan 160 mg daily.  7. Diltiazem one daily 240 mg as before.  8. Prevacid 30 mg daily.  9. Amiodarone 200 mg twice a day for three weeks, then back to 200 mg daily.  10.      Zoloft 50 mg one daily - new medication.  11.      Do not take Rhythmol.   DISCHARGE INSTRUCTIONS:  1. Activity as tolerated.  2  Low-fat, low-salt diet.  1. Follow up with Dr. Elsie Wilkinson 02/22/02 at 9 a.m.   HISTORY OF PRESENT ILLNESS:  A 77 year old white female presented to the ER  with complaints of heart fluttering. The heart was 115 beats per minute. She  was an anxious white female with history of mitral valve prolapse and  paroxysmal atrial fibrillation who had just left the hospital for PAF.  Sotalol was stopped, and amiodarone was started. The patient reports  fluttering occurs when she stands with a normal sinus rhythm with frequent  PACs. When I take Xanax, I feel better.   PAST MEDICAL HISTORY:  Catheterization in 2001. EF was 68%, noncritical  coronary disease.   SOCIAL HISTORY/FAMILY HISTORY/REVIEW OF SYSTEMS:  See HPI.   PHYSICAL EXAMINATION AT DISCHARGE:  VITAL SIGNS:  Blood pressure 140/82,  pulse 52, respirations 20, temperature 98, room air oxygen saturation  95%.  HEART:  S1 and S2, regular rate and rhythm.  LUNGS:  Clear.  ABDOMEN:  Positive bowel sounds.   LABORATORY DATA:  Sodium 139, potassium 4, chloride 106, CO2 26, glucose  102, BUN 12, creatinine 0.9, calcium 9.6, TSH 1.0. EKG on admission:  Sinus  rhythm, poor R-wave progression, baseline artifact. Followup:  No  significant change.   HOSPITAL COURSE:  [name] was admitted by Dr. Elsie Wilkinson for palpitations,  anxiety, and history of paroxysmal atrial fibrillation. She was feeling much  better by 12/23, was started on Zoloft for anxiety. Unfortunately, when she  walked in the hall, her rate went up to 130 with sinus rhythm with PACs. At  first it was difficult to tell due to patient's tremor with walking. Decided  to keep for 24 hours, and p.o. Cardizem was increased. By 02/06/02, she had  no further tachycardia and no further complaints. Heart rate did drop to 51.  Plans were to continue Cardizem at 240, and in the future, the patient may  need permanent pacemaker with history of tachybrady issues and the  paroxysmal atrial fibrillation. She was discharged by Dr. Elsie Wilkinson and will  follow up with him as an outpatient.     [name], N.P.                     Madaline Savage, M.D.    LRI/MEDQ  D:  03/12/2002  T:  03/12/2002  Job:  [id number]

## 2010-07-02 NOTE — Cardiovascular Report (Signed)
[place]. [facility]  Patient:    [name]                        MRN: [id number] Proc. Date: 03/17/99 Adm. Date:  [id number] Attending:  Ophelia Shoulder CC:         Cardiac Catheterization Lab                        Cardiac Catheterization  PROCEDURES PERFORMED: 1. Selective coronary angiography by Judkins technique. 2. Retrograde left heart catheterization. 3. Left ventricular angiography.  COMPLICATIONS:  None.  ENTRY SITE:  Right femoral.  DYE USED:  Omnipaque.  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  A cardiac catheterization as performed in 1987 which was normal.  The current outpatient cardiac catheterization is felt to be indicated because of the patients marked anxiety disorder, the known mitral valve prolapse, and the atypical chest pain with previously positive treadmill studies.  This procedure is done on an outpatient basis electively.  RESULTS:  Pressures:  The left ventricular pressure was 165/25.  Aortic pressure was 165/75, mean of 105.  No aortic valve gradient by pullback technique.  Angiographic results: 1. The left main coronary artery was normal. 2. The LAD coursed to the cardiac apex and gave rise to two medium-sized diagonal    branches and a third tiny diagonal branch.  No lesions were seen. 3. The circumflex coronary artery gives rise to one large obtuse marginal branch    and terminates distally as a posterior descending branch.  It is a circumflex    dominant system.  No lesions are seen. 4. The right coronary artery is medium in size.  It has a posterior descending    branch which is rather small.  No lesions are seen.  The left ventricle shows normal contractility.  The calculated ejection fraction is 67%.  I see no  evidence of mitral regurgitation.  She does have posterior leaflet mitral valve prolapse.  FINAL DIAGNOSES: 1. Angiographically patent coronary arteries with actually a codominant system    right and circumflex. 2. Normal left ventricular systolic function. 3. Posterior leaflet mitral prolapse without mitral regurgitation.  PLAN:  Reassurance.  Continuation of oral medications to prevent PAF. DD:  03/17/99 TD:  03/17/99 Job: [id number]

## 2010-07-02 NOTE — Discharge Summary (Signed)
[place]. [facility]  Patient:    [name], [name] Visit Number: [id number] MRN: [id number]          Service Type: MED Location: [phone] Attending Physician:  Ophelia Shoulder Dictated by:   Darcella Gasman. [place], F.N.P.C. Admit Date:  03/06/2001 Discharge Date: 03/10/2001   CC:         Madaline Savage, M.D.  [facility]  Dr. Raiford Noble B. Sherene Sires, M.D. [facility]  Deanna Artis. Sharene Skeans, M.D.   Discharge Summary  DISCHARGE DIAGNOSES: 1. Palpitations secondary to:    a. Paroxysmal atrial fibrillation.    b. Premature ventricular contractions. 2. Tachycardia. 3. Anxiety. 4. 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 5. 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 6. Hypertension.  DISCHARGE CONDITION:  Improved.  PROCEDURES:  None.  DISCHARGE MEDICATIONS: 1. Rythmol 225 mg every 8 hours. 2. Lanoxin 0.125 once a day. 3. Diovan 160 mg daily. 4. Prevacid 30 mg daily. 5. Ativan 0.5 mg 3 times a day as needed. 6. Aspirin 81 mg daily. 7. Cardizem CD 240 daily.  DISCHARGE INSTRUCTIONS: 1. Activity as tolerated. 2. Diet as before. 3. Stop Altace. 4. Call Dr. Reino Kent office for follow up in 2 weeks.  HISTORY OF PRESENT ILLNESS:  This is a 77 year old white married female with history of paroxysmal atrial fibrillation, hypertension, and a history of patent course in January 2001 and normal left ventricular function, who presented with complaints of palpitations and light-headedness to the emergency room.  The patient reported for 4-5 days she had experienced palpitations, weakness, and light-headedness. These symptoms improved when lying still. The patient had seen Dr. Elsie Lincoln on March 05, 2001, for evaluation, having symptoms then. She had had some rapid heart rate the night before, and in the office original EKG had atrial fibrillation and then she converted to sinus rhythm within 2 minutes.  The patient had been on Rythmol for paroxysmal atrial fibrillation and her atrial fibrillation had been well controlled on Rythmol, but the patient had become confused and stopped taking her medications. Actually she had gotten Rythmol and Altace confused and therefore had stopped the Rythmol. Therefore she had paroxysmal atrial fibrillation.  The patient also has anxiety disorder.  PAST MEDICAL HISTORY: 1. Cardiac with paroxysmal atrial fibrillation, normal queries in    January 2001 and normal left ventricular function. 2. History of hypertension. 3. History of osteoporosis. 4. History of hysterectomy and tonsillectomy. 5. Anxiety disorder. 6. Memory problems.  OUTPATIENT MEDICATIONS: 1. Lanoxin 0.125 daily. 2. Diovan 160 daily. 3. Rythmol 150, 1-1/2 tablets t.i.d. that she had recently stopped    taking. 4. Aspirin 81 mg daily. 5. Altace 5 mg b.i.d. 6. Xanax t.i.d. p.r.n., but she was actually taking the medication    3 times a day. 7. Diltiazem 240 daily. 8. Prevacid 30 daily.  SOCIAL HISTORY/FAMILY HISTORY/REVIEW OF SYSTEMS:  See H&P.  ALLERGIES:  No known drug allergies.  PHYSICAL EXAMINATION:  VITAL SIGNS AT DISCHARGE:  Blood pressure 144/55, pulse 57, respirations 20, temperature 98.1, oxygen saturation on room air 93%.  GENERAL:  Alert and oriented white female.  HEART:  S1, S2. Regular rate and rhythm.  LUNGS:  Clear.  EXTREMITIES:  Without edema, per Dr. Gery Pray.  LABORATORY AND ACCESSORY DATA:  Admission labs:  Hemoglobin 14.2, hematocrit 42.5, WBC 5.1, MCV 92.2, platelets 183. Neutrophils 61, lymphocytes 27, monocytes 10, eosinophils 2, basophils 1, pro time 12.5, INR 0.9, and PTT 27. Chemistries:  Sodium 144, potassium 4.1, chloride 111, CO2 30, glucose 105, BUN 13, creatinine 0.9, calcium 9.1, total protein 12.7, albumin 3.2, AST 18, ALT 17, ALP 65, total bilirubin 0.5. These remained stable throughout the hospitalization.  Cardiac enzymes:  CK 38, MB  0.8, and troponin 0.01. Cholesterol 274, triglycerides 97, HDL 63, and LDL 192. T4 9.0, TSH 1.490, T3 144.7.  Digoxin level was 0.6. Pronestyl level 2.  HOSPITAL COURSE:  The patient was sent to the emergency room secondary to feeling near syncope after having palpitations. She had come off her Rythmol prior to this and had just the day before had it restarted. She had shown some paroxysmal atrial fibrillation in Dr. Reino Kent office and then converted to a sinus rhythm the day prior to this admission.  When the patient was admitted, she was in atrial flutter with PVCs, heart rate in the 70s, then she converted to a sinus rhythm. It was felt that the patient was not a candidate for Coumadin.  Initially, there was some confusion about the patient not being on Rythmol, and she had been placed on Pronestyl. This was discharged prior to her discharge home, and she was put back on Rythmol as the patient had not been taking her Rythmol at home which led to the breakthrough of atrial fibrillation. She was noticed to have a tremor. Neurologic consult was obtained. Dr. Sharene Skeans felt the tremor was benign essential tremor of hands and jaw, did not feel it was Parkinsons. He also felt she had mild organic brain syndrome with some memory dysfunction. The patient continued to improve, although she continued to complain of tachycardia. Her heart rate was around 100, and medications were again adjusted. By March 09, 2001, she was felt improving and it was felt home health needed to evaluate her as an outpatient and ensure that her medications were correct, and she was agreeable to this.  Her husband is also aware that she was having difficulty with memory and knows he needs to monitor. By March 10, 2001, she was ready for discharge home. She will follow up as an outpatient with Dr. Elsie Lincoln. Dictated by:   Darcella Gasman In[place], F.N.P.C. Attending Physician:  Ophelia Shoulder DD:  03/27/01 TD:   03/28/01 Job: [id number]

## 2010-11-04 LAB — BASIC METABOLIC PANEL
BUN: 26 — ABNORMAL HIGH
BUN: 8
BUN: 9
BUN: 9
CO2: 32
Calcium: 8.3 — ABNORMAL LOW
Calcium: 8.3 — ABNORMAL LOW
Calcium: 8.6
Calcium: 9.7
Chloride: 103
Creatinine, Ser: 0.76
Creatinine, Ser: 0.84
Creatinine, Ser: 0.98
GFR calc Af Amer: >60
GFR calc non Af Amer: 55 — ABNORMAL LOW
GFR calc non Af Amer: 60 — ABNORMAL LOW
GFR calc non Af Amer: >60
GFR calc non Af Amer: >60
Glucose, Bld: 158 — ABNORMAL HIGH
Potassium: 3.5
Potassium: 3.6
Potassium: 4.7
Sodium: 142
Sodium: 144

## 2010-11-04 LAB — CBC
HCT: 29.8 — ABNORMAL LOW
HCT: 32.9 — ABNORMAL LOW
HCT: 34.1 — ABNORMAL LOW
HCT: 34.4 — ABNORMAL LOW
Hemoglobin: 10.8 — ABNORMAL LOW
Hemoglobin: 11.4 — ABNORMAL LOW
MCHC: 33
MCHC: 34
MCHC: 34
MCV: 92.3
MCV: 92.5
Platelets: 117 — ABNORMAL LOW
Platelets: 129 — ABNORMAL LOW
Platelets: 142 — ABNORMAL LOW
Platelets: 167
Platelets: 189
Platelets: 189
RDW: 13.8
RDW: 14
RDW: 14
RDW: 14
RDW: 14.2
WBC: 11.9 — ABNORMAL HIGH
WBC: 7.7
WBC: 8.9

## 2010-11-04 LAB — DIFFERENTIAL
Lymphocytes Relative: 27
Lymphs Abs: 2.3
Monocytes Relative: 10

## 2010-11-04 LAB — B-NATRIURETIC PEPTIDE (CONVERTED LAB)
Pro B Natriuretic peptide (BNP): 414 — ABNORMAL HIGH
Pro B Natriuretic peptide (BNP): 479 — ABNORMAL HIGH
Pro B Natriuretic peptide (BNP): 814 — ABNORMAL HIGH

## 2010-11-04 LAB — RENAL FUNCTION PANEL
CO2: 34 — ABNORMAL HIGH
Glucose, Bld: 89
Potassium: 3.6
Sodium: 137

## 2010-11-04 LAB — PROTIME-INR
INR: 1
Prothrombin Time: 13.4

## 2010-11-04 LAB — OCCULT BLOOD X 1 CARD TO LAB, STOOL: Fecal Occult Bld: NEGATIVE

## 2010-11-04 LAB — CLOSTRIDIUM DIFFICILE EIA: C difficile Toxins A+B, EIA: NEGATIVE

## 2010-11-04 LAB — APTT: aPTT: 23 — ABNORMAL LOW

## 2010-11-11 ENCOUNTER — Encounter: Payer: Self-pay | Admitting: Hematology and Oncology

## 2010-11-16 ENCOUNTER — Other Ambulatory Visit: Payer: Self-pay | Admitting: Hematology and Oncology

## 2010-11-16 ENCOUNTER — Encounter (HOSPITAL_BASED_OUTPATIENT_CLINIC_OR_DEPARTMENT_OTHER): Payer: BC Managed Care – PPO | Admitting: Hematology and Oncology

## 2010-11-16 DIAGNOSIS — D696 Thrombocytopenia, unspecified: Secondary | ICD-10-CM

## 2010-11-16 LAB — CBC WITH DIFFERENTIAL/PLATELET
EOS%: 1.5 % (ref 0.0–7.0)
Eosinophils Absolute: 0.1 10*3/uL (ref 0.0–0.5)
LYMPH%: 24.6 % (ref 14.0–49.7)
MCH: 31 pg (ref 25.1–34.0)
MCV: 93.2 fL (ref 79.5–101.0)
MONO%: 10.5 % (ref 0.0–14.0)
Platelets: 90 10*3/uL — ABNORMAL LOW (ref 145–400)
RBC: 4.22 10*6/uL (ref 3.70–5.45)
RDW: 15.3 % — ABNORMAL HIGH (ref 11.2–14.5)

## 2010-11-16 LAB — COMPREHENSIVE METABOLIC PANEL
ALT: 17 U/L (ref 0–35)
CO2: 30 mEq/L (ref 19–32)
Calcium: 10.7 mg/dL — ABNORMAL HIGH (ref 8.4–10.5)
Chloride: 105 mEq/L (ref 96–112)
Creatinine, Ser: 0.78 mg/dL (ref 0.50–1.10)
Glucose, Bld: 108 mg/dL — ABNORMAL HIGH (ref 70–99)
Sodium: 141 mEq/L (ref 135–145)
Total Bilirubin: 0.4 mg/dL (ref 0.3–1.2)
Total Protein: 6.5 g/dL (ref 6.0–8.3)

## 2010-11-16 LAB — APTT: aPTT: 31 seconds (ref 24–37)

## 2010-11-16 LAB — IVY BLEEDING TIME: Bleeding Time: 7.5 Minutes (ref 2.0–8.0)

## 2010-11-16 LAB — MORPHOLOGY

## 2010-11-16 LAB — LACTATE DEHYDROGENASE: LDH: 235 U/L (ref 94–250)

## 2010-11-18 LAB — PROTEIN ELECTROPHORESIS, SERUM, WITH REFLEX
Alpha-1-Globulin: 4.8 % (ref 2.9–4.9)
Alpha-2-Globulin: 10.7 % (ref 7.1–11.8)
Beta 2: 4.8 % (ref 3.2–6.5)
Gamma Globulin: 10.9 % — ABNORMAL LOW (ref 11.1–18.8)

## 2010-11-19 LAB — BLOOD GAS, ARTERIAL
Drawn by: 283371
FIO2: 0.21
Patient temperature: 99.5
pCO2 arterial: 42.7
pH, Arterial: 7.401 — ABNORMAL HIGH

## 2010-11-19 LAB — BASIC METABOLIC PANEL
BUN: 19
BUN: 26 — ABNORMAL HIGH
CO2: 29
Calcium: 8 — ABNORMAL LOW
Calcium: 8.1 — ABNORMAL LOW
Chloride: 103
Creatinine, Ser: 1.2
GFR calc non Af Amer: 31 — ABNORMAL LOW
Glucose, Bld: 127 — ABNORMAL HIGH

## 2010-11-19 LAB — COMPREHENSIVE METABOLIC PANEL
ALT: 18
AST: 27
Albumin: 2.6 — ABNORMAL LOW
Alkaline Phosphatase: 37 — ABNORMAL LOW
GFR calc Af Amer: 30 — ABNORMAL LOW
Glucose, Bld: 134 — ABNORMAL HIGH
Potassium: 4
Sodium: 138
Total Protein: 5.3 — ABNORMAL LOW

## 2010-11-19 LAB — DIFFERENTIAL
Basophils Absolute: 0
Basophils Relative: 0
Eosinophils Absolute: 0
Monocytes Relative: 8
Neutrophils Relative %: 82 — ABNORMAL HIGH

## 2010-11-19 LAB — TROPONIN I: Troponin I: 0.1 — ABNORMAL HIGH

## 2010-11-19 LAB — I-STAT 8, (EC8 V) (CONVERTED LAB)
Bicarbonate: 30.6 — ABNORMAL HIGH
Glucose, Bld: 139 — ABNORMAL HIGH
HCT: 45
Hemoglobin: 15.3 — ABNORMAL HIGH
Operator id: 234501
Potassium: 4.2
Sodium: 137
TCO2: 33

## 2010-11-19 LAB — URINALYSIS, ROUTINE W REFLEX MICROSCOPIC
Bilirubin Urine: NEGATIVE
Glucose, UA: NEGATIVE
Ketones, ur: NEGATIVE
Nitrite: NEGATIVE
Protein, ur: 30 — AB
Protein, ur: NEGATIVE
Urobilinogen, UA: 0.2
pH: 6

## 2010-11-19 LAB — CULTURE, RESPIRATORY W GRAM STAIN

## 2010-11-19 LAB — POCT CARDIAC MARKERS
CKMB, poc: 2.5
Myoglobin, poc: >500
Troponin i, poc: <0.05

## 2010-11-19 LAB — CULTURE, BLOOD (ROUTINE X 2)
Culture: NO GROWTH
Culture: NO GROWTH

## 2010-11-19 LAB — APTT
aPTT: 27
aPTT: 54 — ABNORMAL HIGH

## 2010-11-19 LAB — CBC
HCT: 32.3 — ABNORMAL LOW
Hemoglobin: 11.1 — ABNORMAL LOW
MCHC: 32.5
MCHC: 33.5
MCHC: 34.3
MCV: 92.8
MCV: 95.3
Platelets: 105 — ABNORMAL LOW
RBC: 3.5 — ABNORMAL LOW
RBC: 4.43
RDW: 14.5
RDW: 14.6
WBC: 10.6 — ABNORMAL HIGH

## 2010-11-19 LAB — LEGIONELLA ANTIGEN, URINE

## 2010-11-19 LAB — URINE MICROSCOPIC-ADD ON

## 2010-11-19 LAB — POCT I-STAT 3, ART BLOOD GAS (G3+)
O2 Saturation: 94
TCO2: 31
pH, Arterial: 7.242 — ABNORMAL LOW

## 2010-11-19 LAB — HEPARIN LEVEL (UNFRACTIONATED): Heparin Unfractionated: <0.1 — ABNORMAL LOW

## 2010-11-19 LAB — EXPECTORATED SPUTUM ASSESSMENT W GRAM STAIN, RFLX TO RESP C

## 2010-11-19 LAB — B-NATRIURETIC PEPTIDE (CONVERTED LAB)
Pro B Natriuretic peptide (BNP): 237 — ABNORMAL HIGH
Pro B Natriuretic peptide (BNP): 302 — ABNORMAL HIGH

## 2010-11-19 LAB — CARDIAC PANEL(CRET KIN+CKTOT+MB+TROPI)
CK, MB: 2.6
Relative Index: 1
Total CK: 250 — ABNORMAL HIGH
Troponin I: 0.11 — ABNORMAL HIGH

## 2010-11-19 LAB — INFLUENZA A+B VIRUS AG-DIRECT(RAPID)
Inflenza A Ag: NEGATIVE
Influenza B Ag: NEGATIVE

## 2010-11-19 LAB — URINE CULTURE: Culture: NO GROWTH

## 2010-11-19 LAB — CK TOTAL AND CKMB (NOT AT ARMC): Total CK: 273 — ABNORMAL HIGH

## 2010-11-19 LAB — TSH: TSH: 1.457

## 2010-11-19 LAB — RSV SCREEN (NASOPHARYNGEAL) NOT AT ARMC: RSV Ag, EIA: NEGATIVE

## 2010-11-23 ENCOUNTER — Ambulatory Visit (HOSPITAL_COMMUNITY)
Admission: RE | Admit: 2010-11-23 | Discharge: 2010-11-23 | Disposition: A | Discharge status: Home / Self Care | Payer: Medicare Other | Source: Ambulatory Visit | Attending: Hematology and Oncology | Admitting: Hematology and Oncology

## 2010-11-23 DIAGNOSIS — N281 Cyst of kidney, acquired: Secondary | ICD-10-CM | POA: Insufficient documentation

## 2010-11-23 DIAGNOSIS — K7689 Other specified diseases of liver: Secondary | ICD-10-CM | POA: Insufficient documentation

## 2010-11-23 DIAGNOSIS — D696 Thrombocytopenia, unspecified: Secondary | ICD-10-CM | POA: Insufficient documentation

## 2010-12-13 ENCOUNTER — Other Ambulatory Visit: Payer: Self-pay | Admitting: Hematology & Oncology

## 2010-12-13 ENCOUNTER — Encounter (HOSPITAL_BASED_OUTPATIENT_CLINIC_OR_DEPARTMENT_OTHER): Payer: Medicare Other | Admitting: Hematology & Oncology

## 2010-12-13 DIAGNOSIS — D696 Thrombocytopenia, unspecified: Secondary | ICD-10-CM

## 2010-12-13 DIAGNOSIS — Z7982 Long term (current) use of aspirin: Secondary | ICD-10-CM

## 2010-12-13 DIAGNOSIS — M069 Rheumatoid arthritis, unspecified: Secondary | ICD-10-CM

## 2010-12-13 LAB — CBC WITH DIFFERENTIAL (CANCER CENTER ONLY)
BASO#: 0.1 10*3/uL (ref 0.0–0.2)
Eosinophils Absolute: 0.1 10*3/uL (ref 0.0–0.5)
HCT: 38.8 % (ref 34.8–46.6)
LYMPH%: 31.4 % (ref 14.0–48.0)
MCV: 94 fL (ref 81–101)
MONO#: 0.6 10*3/uL (ref 0.1–0.9)
NEUT%: 54 % (ref 39.6–80.0)
RBC: 4.13 10*6/uL (ref 3.70–5.32)
WBC: 5.6 10*3/uL (ref 3.9–10.0)

## 2010-12-13 LAB — CHCC SATELLITE - SMEAR

## 2010-12-14 LAB — ANA: Anti Nuclear Antibody(ANA): NEGATIVE

## 2010-12-14 LAB — RETICULOCYTES (CHCC)
ABS Retic: 43.4 10*3/uL (ref 19.0–186.0)
RBC.: 4.34 MIL/uL (ref 3.87–5.11)

## 2010-12-14 LAB — RHEUMATOID FACTOR: Rhuematoid fact SerPl-aCnc: <10 IU/mL (ref <=14)

## 2010-12-15 ENCOUNTER — Telehealth: Payer: Self-pay | Admitting: Hematology & Oncology

## 2010-12-20 NOTE — Telephone Encounter (Signed)
Pt aware.

## 2011-01-20 ENCOUNTER — Other Ambulatory Visit: Payer: Self-pay | Admitting: Hematology & Oncology

## 2011-01-20 ENCOUNTER — Ambulatory Visit (HOSPITAL_BASED_OUTPATIENT_CLINIC_OR_DEPARTMENT_OTHER): Payer: Medicare Other | Admitting: Hematology & Oncology

## 2011-01-20 ENCOUNTER — Other Ambulatory Visit (HOSPITAL_BASED_OUTPATIENT_CLINIC_OR_DEPARTMENT_OTHER): Payer: Medicare Other | Admitting: Lab

## 2011-01-20 ENCOUNTER — Encounter: Payer: Self-pay | Admitting: Hematology & Oncology

## 2011-01-20 VITALS — BP 127/75 | HR 76 | Temp 98.2°F | Ht 65.0 in | Wt 162.0 lb

## 2011-01-20 DIAGNOSIS — D696 Thrombocytopenia, unspecified: Secondary | ICD-10-CM

## 2011-01-20 LAB — CBC WITH DIFFERENTIAL (CANCER CENTER ONLY)
BASO%: 1.4 % (ref 0.0–2.0)
LYMPH%: 34.7 % (ref 14.0–48.0)
MCV: 95 fL (ref 81–101)
MONO#: 0.5 10*3/uL (ref 0.1–0.9)
Platelets: 84 10*3/uL — ABNORMAL LOW (ref 145–400)
RDW: 14.3 % (ref 11.1–15.7)
WBC: 4.2 10*3/uL (ref 3.9–10.0)

## 2011-01-20 LAB — CHCC SATELLITE - SMEAR

## 2011-01-20 MED ORDER — DEXAMETHASONE 4 MG PO TABS: 40.0000 mg | ORAL_TABLET | Freq: Every day | ORAL | Status: AC

## 2011-01-20 NOTE — Progress Notes (Signed)
This office note has been dictated.

## 2011-01-21 NOTE — Progress Notes (Signed)
CC:   Juluis Rainier, M.D. Magnus Sinning) Tenny Craw, M.D.  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  CURRENT THERAPY:  Observation.  INTERIM HISTORY:  Ms. Dosch comes in for followup.  We last saw her back in October.  At that point in time, her workup was relatively unremarkable for the thrombocytopenia.  She was negative for ANA and rheumatoid factor.  She is really bothered by rheumatoid arthritis.  She has fairly severe rheumatoid arthritis in her hands.  She wishes to go see a Careers adviser.  I gave her the name of Dr. Amanda Pea.  However, I told her that her family doctor would have to make the referral.  She has not had any bleeding.  She is still on aspirin.  There have been no problems with change in bowel or bladder.  She is very worried about her platelet count.  She wants me to do a bone marrow test on her.  I reassured her that this was not necessary right now as she is asymptomatic.  PHYSICAL EXAM:  This is an elderly appearing white female in no obvious distress.  Vital signs:  98.2, pulse 76, respiratory rate 18, blood pressure 127/75, weight is 162.  Head and neck:  Shows a normocephalic, atraumatic skull.  There are no ocular or oral lesions.  There are no intraoral ecchymoses or petechiae.  Neck:  Supple with no adenopathy. Lungs:  Clear bilaterally.  Cardiac:  Irregular rate and rhythm consistent with atrial fibrillation.  She has 1/6 systolic ejection murmur.  Abdomen:  Soft with good bowel sounds.  There is no fluid wave. There is no palpable hepatosplenomegaly.  Back:  Shows some slight kyphosis.  There is no tenderness over the spine, ribs, or hips. Extremities:  Shows significant changes on her hand reflective of rheumatoid arthritis.  Skin:  No rashes, ecchymosis or petechia.  LAB STUDIES:  White cell count 4.2, hemoglobin 12.8, hematocrit 39.2, platelet count 84,000.  IMPRESSION:  Ms. Holtrop is a 77 year old female with thrombocytopenia. Her  platelet count has dropped a little from when we last saw her in October.  I still want to believe that this is autoimmune based.  As such, I am going to give her a "trial" of Decadron.  I think a short course of high- dose Decadron (40 mg p.o. daily x4 days) would be a good test.  If her platelet count rises with the Decadron, I think we have the diagnosis and we do not need to do a bone marrow test on her.  If her platelet count does not improve with the Decadron, then I think we are going to have to do a bone marrow test on her, particularly if she is going to undergo hand surgery for her rheumatoid arthritis changes so that we properly prepare her for surgery.  I warned Ms. Brackin to be careful reading the computer.  She thought that she had leukemia.  I reassured her that I cannot find that this was the case.  I still do not see a problem with her being on aspirin.  We will have her come back just for lab work in about 2 weeks or so.  I will see her back myself in January.    ______________________________ Josph Macho, M.D. PRE/MEDQ  D:  01/20/2011  T:  01/20/2011  Job:  [id number]

## 2011-03-02 ENCOUNTER — Other Ambulatory Visit (HOSPITAL_BASED_OUTPATIENT_CLINIC_OR_DEPARTMENT_OTHER): Payer: Medicare Other | Admitting: Lab

## 2011-03-02 ENCOUNTER — Ambulatory Visit (HOSPITAL_BASED_OUTPATIENT_CLINIC_OR_DEPARTMENT_OTHER): Payer: Medicare Other | Admitting: Hematology & Oncology

## 2011-03-02 VITALS — BP 134/76 | HR 84 | Temp 97.3°F | Ht 65.0 in | Wt 157.0 lb

## 2011-03-02 DIAGNOSIS — M069 Rheumatoid arthritis, unspecified: Secondary | ICD-10-CM

## 2011-03-02 DIAGNOSIS — D696 Thrombocytopenia, unspecified: Secondary | ICD-10-CM

## 2011-03-02 LAB — CBC WITH DIFFERENTIAL (CANCER CENTER ONLY)
BASO%: 1 % (ref 0.0–2.0)
LYMPH%: 30 % (ref 14.0–48.0)
MCH: 30.5 pg (ref 26.0–34.0)
MCV: 95 fL (ref 81–101)
MONO%: 12 % (ref 0.0–13.0)
Platelets: 84 10*3/uL — ABNORMAL LOW (ref 145–400)
RDW: 14.2 % (ref 11.1–15.7)

## 2011-03-02 MED ORDER — DEXAMETHASONE 4 MG PO TABS: ORAL_TABLET | ORAL | Status: DC

## 2011-03-02 NOTE — Progress Notes (Signed)
This office note has been dictated.

## 2011-03-03 ENCOUNTER — Telehealth: Payer: Self-pay | Admitting: Hematology & Oncology

## 2011-03-03 NOTE — Telephone Encounter (Signed)
Pt aware of 03-21-11 appointment

## 2011-03-03 NOTE — Progress Notes (Signed)
CC:   [name]. Amanda Pea, M.D. 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  CURRENT THERAPY:  Observation.  INTERIM HISTORY:  [name] comes in for followup.  She is doing okay from a blood point of view.  She has not had any problems with bleeding. She did fall and sustained ecchymoses on her, I think, right side. Again, she has not had any obvious bleeding.  She, I think, did see Dr. Amanda Pea of Orthopedic Surgery.  [name] has terrible rheumatoid arthritis.  She is going to need surgery for both hands to correct rheumatoid deformities.  She has not had any change in medications.  She recently was put on Norvasc and taken off diltiazem.  She has had no weight loss or weight gain.  Her appetite is good.  She has had no change in bowel or bladder habits.  PHYSICAL EXAMINATION:  General Appearance:  This is an elderly, thin but well-nourished, white female in no obvious distress.  Vital Signs: 97.3, pulse 84, respiratory rate 16, blood pressure is 134/76.  Weight is 157.  Head and Neck Exam:  A normocephalic, atraumatic skull.  There are no ocular or oral lesions.  There are no palpable cervical or supraclavicular lymph nodes.  Lungs:  Clear to percussion and auscultation bilaterally.  Cardiac Exam:  Regular rate and rhythm with a normal S1 and S2.  There are no murmurs, rubs, or bruits.  Abdominal Exam:  Soft with good bowel sounds.  There is no palpable abdominal mass.  There is no fluid wave.  There is no palpable hepatosplenomegaly. Back Exam:  No tenderness over the spine, ribs, or hips.  Extremities: No clubbing, cyanosis, or edema.  She does have changes consistent with rheumatoid arthritis.  Skin Exam:  No rashes, ecchymoses, or petechia. Neurological Exam:  No focal neurological deficits.  LABORATORY STUDIES:  White cell count is 6.9, hemoglobin 13.5, hematocrit 41.8, platelet count 84,000.  IMPRESSION:  [name] is a  78 year old, white female with thrombocytopenia.  This is stable.  I still have to believe that we are looking at an immune basis for this. She does have rheumatoid arthritis.  I am going to try her on another course of high-dose Decadron. Hopefully, we can see a response.  We will plan to get her back in about 2-3 weeks so we can see how her platelets look.  I still do not believe that bleeding would be an issue with her during surgery.  To me, she would need to be off her aspirin a good 7 days before surgery in order to minimize increased bleeding risk.    ______________________________ Josph Macho, M.D. PRE/MEDQ  D:  03/02/2011  T:  03/02/2011  Job:  [id number]

## 2011-03-21 ENCOUNTER — Ambulatory Visit (HOSPITAL_BASED_OUTPATIENT_CLINIC_OR_DEPARTMENT_OTHER): Payer: Medicare Other | Admitting: Hematology & Oncology

## 2011-03-21 ENCOUNTER — Other Ambulatory Visit (HOSPITAL_BASED_OUTPATIENT_CLINIC_OR_DEPARTMENT_OTHER): Payer: Medicare Other | Admitting: Lab

## 2011-03-21 VITALS — BP 122/77 | HR 71 | Temp 97.0°F | Ht 65.0 in | Wt 157.0 lb

## 2011-03-21 DIAGNOSIS — D696 Thrombocytopenia, unspecified: Secondary | ICD-10-CM

## 2011-03-21 LAB — CBC WITH DIFFERENTIAL (CANCER CENTER ONLY)
BASO%: 1.1 % (ref 0.0–2.0)
LYMPH#: 1.6 10*3/uL (ref 0.9–3.3)
LYMPH%: 25.7 % (ref 14.0–48.0)
MONO#: 0.7 10*3/uL (ref 0.1–0.9)
Platelets: 92 10*3/uL — ABNORMAL LOW (ref 145–400)
RDW: 14.7 % (ref 11.1–15.7)
WBC: 6.4 10*3/uL (ref 3.9–10.0)

## 2011-03-21 LAB — CHCC SATELLITE - SMEAR

## 2011-03-21 NOTE — Progress Notes (Signed)
This office note has been dictated.

## 2011-03-22 NOTE — Progress Notes (Signed)
CC:   Dossie Der, MD Magnus Sinning) Tenny Craw, M.D. Dionne Ano. Amanda Pea, M.D.  DIAGNOSIS:  Thrombocytopenia-likely immune-based.  CURRENT THERAPY:  Observation.  INTERIM HISTORY:  Ms. Ventura comes in for followup.  She is doing okay. When we last saw her, we did put her on a short 4-day course of high- dose Decadron.  She did well with this.  She did see Dr. Amanda Pea.  Dr. Amanda Pea is going to do surgery on her.  I am not sure when the surgery will be scheduled.  I am trying to get hold of Dr. Amanda Pea as I speak.  Ms. Cheetham has had no change in her medications.  She has had no bruising or bleeding.  She is on aspirin because of atrial fibrillation.  I did talk to Dr. Amanda Pea.  He says that with the kind of surgery that he is anticipating, her blood counts should be okay.  In fact, he said that she also could probably be on aspirin.  PHYSICAL EXAM:  General: This is an elderly white female in no obvious distress.  Vital signs: Temperature 97, pulse 71, respiratory rate 14, blood pressure 120/77, and weight is 157.  Head and neck exam shows a normocephalic, atraumatic skull.  There are no ocular or oral lesions. There are no palpable cervical or supraclavicular lymph nodes.  Lungs are clear to percussion and auscultation bilaterally.  Cardiac examination:  Regular rate and rhythm with a normal S1 and S2.  She has an occasional extra beat.  She has a 1/6 systolic ejection murmur. Abdominal exam: Soft with good bowel sounds.  There is no palpable abdominal mass.  There is no fluid wave.  There is no palpable hepatosplenomegaly.  Extremities: Shows arthritic changes in her joints. Skin exam: No rashes, ecchymosis, or petechia.  LABORATORY STUDIES:  White cell count 6.4, hemoglobin 13, hematocrit 39.7, and platelet count 92,000.  Peripheral smear shows good platelet maturation.  She has numerous large platelets.  Platelets are well-granulated.  There is no immature myeloid cells.   There is no nucleated red blood cells.  IMPRESSION:  Ms. Karpel is a 78 year old, I think, white female with thrombocytopenia.  This is mild at best.  Again, I really believe that she can go into surgery with a platelet count of 80/100,000.  I do not see that we really need to get too aggressive with respect to trying to get her platelet count up.  I suppose that if we did need to get her platelet count up, I would try endplate on her.  I want to see her back in about 6 weeks time.  When I spoke with Dr. Amanda Pea, I told him to let me know when her surgery is going to be so that I can get her in the week before that and check her blood counts to make sure her platelets have not dropped.    ______________________________ Josph Macho, M.D. PRE/MEDQ  D:  03/21/2011  T:  03/22/2011  Job:  [id number]

## 2011-03-25 ENCOUNTER — Telehealth: Payer: Self-pay | Admitting: Hematology & Oncology

## 2011-03-25 ENCOUNTER — Other Ambulatory Visit: Payer: Self-pay | Admitting: *Deleted

## 2011-03-25 DIAGNOSIS — D693 Immune thrombocytopenic purpura: Secondary | ICD-10-CM

## 2011-03-25 NOTE — Progress Notes (Signed)
Received a call from the pt stating that Dr Amanda Pea is doing surgery on 2/28 and is needing labs done 1 week prior to her surgery. Reviewed with Dr Myna Hidalgo. To have CBC, PT, PTT, and Factor II levels drawn about 10 days prior to surgery. Will ask Raiford Noble to call the pt to set up her appt.

## 2011-03-25 NOTE — Telephone Encounter (Signed)
Per RN moved lab to 2-19 mailed schedule since unable to reach patient

## 2011-03-25 NOTE — Telephone Encounter (Signed)
Per order called patient to schedule lab. No answer or machine and no alternative number to call. RN aware

## 2011-03-29 ENCOUNTER — Other Ambulatory Visit: Payer: Medicare Other | Admitting: Lab

## 2011-04-05 ENCOUNTER — Other Ambulatory Visit: Payer: Medicare Other | Admitting: Lab

## 2011-04-07 ENCOUNTER — Other Ambulatory Visit (HOSPITAL_BASED_OUTPATIENT_CLINIC_OR_DEPARTMENT_OTHER): Payer: Medicare Other | Admitting: Lab

## 2011-04-07 DIAGNOSIS — D693 Immune thrombocytopenic purpura: Secondary | ICD-10-CM

## 2011-04-07 DIAGNOSIS — D696 Thrombocytopenia, unspecified: Secondary | ICD-10-CM

## 2011-04-07 LAB — CBC WITH DIFFERENTIAL (CANCER CENTER ONLY)
BASO#: 0.1 10*3/uL (ref 0.0–0.2)
BASO%: 0.8 % (ref 0.0–2.0)
EOS%: 1.7 % (ref 0.0–7.0)
Eosinophils Absolute: 0.1 10*3/uL (ref 0.0–0.5)
HCT: 37.8 % (ref 34.8–46.6)
HGB: 12.4 g/dL (ref 11.6–15.9)
LYMPH#: 1.7 10*3/uL (ref 0.9–3.3)
LYMPH%: 27.9 % (ref 14.0–48.0)
MCH: 30 pg (ref 26.0–34.0)
MCHC: 32.8 g/dL (ref 32.0–36.0)
MCV: 92 fL (ref 81–101)
MONO#: 0.9 10*3/uL (ref 0.1–0.9)
MONO%: 14.4 % — ABNORMAL HIGH (ref 0.0–13.0)
NEUT#: 3.3 10*3/uL (ref 1.5–6.5)
NEUT%: 55.2 % (ref 39.6–80.0)
Platelets: 119 10*3/uL — ABNORMAL LOW (ref 145–400)
RBC: 4.13 10*6/uL (ref 3.70–5.32)
RDW: 14.9 % (ref 11.1–15.7)
WBC: 6 10*3/uL (ref 3.9–10.0)

## 2011-04-08 LAB — APTT: aPTT: 33 seconds (ref 24–37)

## 2011-04-08 LAB — FACTOR 2 ASSAY: Factor II Activity: 98 % (ref 74–131)

## 2011-04-13 ENCOUNTER — Telehealth: Payer: Self-pay | Admitting: *Deleted

## 2011-04-13 NOTE — Telephone Encounter (Signed)
Called patient to let her know that her platelets are now 119,000 per dr. Myna Hidalgo .  Wished patient best of luck with surgery.

## 2011-04-13 NOTE — Telephone Encounter (Signed)
Message copied by Anselm Jungling on Wed Apr 13, 2011 11:11 AM ------      Message from: Arlan Organ R      Created: Mon Apr 11, 2011  7:03 PM       Call and tell her plt are now 119,000!!!  This is great!!!!  Good luck w/ surgery!!!  pete

## 2011-04-20 ENCOUNTER — Ambulatory Visit (HOSPITAL_BASED_OUTPATIENT_CLINIC_OR_DEPARTMENT_OTHER): Payer: Medicare Other | Admitting: Hematology & Oncology

## 2011-04-20 ENCOUNTER — Other Ambulatory Visit (HOSPITAL_BASED_OUTPATIENT_CLINIC_OR_DEPARTMENT_OTHER): Payer: Medicare Other | Admitting: Lab

## 2011-04-20 VITALS — BP 133/73 | HR 78 | Temp 97.6°F | Ht 65.0 in | Wt 162.0 lb

## 2011-04-20 DIAGNOSIS — D696 Thrombocytopenia, unspecified: Secondary | ICD-10-CM

## 2011-04-20 DIAGNOSIS — D693 Immune thrombocytopenic purpura: Secondary | ICD-10-CM

## 2011-04-20 LAB — CBC WITH DIFFERENTIAL (CANCER CENTER ONLY)
BASO%: 1.4 % (ref 0.0–2.0)
HCT: 38.2 % (ref 34.8–46.6)
LYMPH%: 27.7 % (ref 14.0–48.0)
MCH: 30.4 pg (ref 26.0–34.0)
MCHC: 33 g/dL (ref 32.0–36.0)
MCV: 92 fL (ref 81–101)
MONO%: 12.4 % (ref 0.0–13.0)
NEUT%: 51.8 % (ref 39.6–80.0)
Platelets: 129 10*3/uL — ABNORMAL LOW (ref 145–400)
RDW: 15.2 % (ref 11.1–15.7)

## 2011-04-20 LAB — CHCC SATELLITE - SMEAR

## 2011-04-20 NOTE — Progress Notes (Signed)
Mrs. [name] has not yet had her surgery before hand. He is bad rheumatoid arthritic changes in her hands. Other health issues, Mr. [name]. Physical pacemaker in. Dr. Amanda Pea will fibula operated on her in the next week or so.  Platelet count continues to improve. We have not done anything for her as of yet. He had tried her on some brief courses of steroids. Last time we did this was probably 2 months ago.  No bleeding. There is no change in medications. Her appetite is good.  She is looking forward to having her surgery and getting better use in her hands.  Physical exam shows stable vital signs. Blood pressure is 133/77. Pulse is 78. Weight is 162.  Skin shows no ecchymosis or petechia. Extremities shows arthritic changes. These changes are consistent with rheumatoid arthritis. Abdominal exam no splenomegaly. Cardiac exam regular rate and rhythm. She has a faint systolic ejection murmur. Lungs are clear bilaterally. There is no adenopathy.  Laboratory studies show a reticulocyte count of 129. White cell count is 6.4. Hemoglobin is 12.6.    Her blood smear and looks consistent with immune thrombocytopenia. She does have several large platelets.  She should do well with surgery. I do not see any special precautions for her minimal thrombocytopenia.  We will get her back to see [place] in 2-3 months.  [name]  [name]

## 2011-05-04 ENCOUNTER — Other Ambulatory Visit: Payer: Self-pay | Admitting: Hematology & Oncology

## 2011-05-04 DIAGNOSIS — D696 Thrombocytopenia, unspecified: Secondary | ICD-10-CM

## 2011-05-16 HISTORY — PX: HAND SURGERY: SHX662

## 2011-05-30 ENCOUNTER — Other Ambulatory Visit (HOSPITAL_BASED_OUTPATIENT_CLINIC_OR_DEPARTMENT_OTHER): Payer: Medicare Other | Admitting: Lab

## 2011-05-30 DIAGNOSIS — D696 Thrombocytopenia, unspecified: Secondary | ICD-10-CM

## 2011-05-30 LAB — CBC WITH DIFFERENTIAL (CANCER CENTER ONLY)
BASO%: 1 % (ref 0.0–2.0)
HCT: 38 % (ref 34.8–46.6)
LYMPH#: 1.6 10*3/uL (ref 0.9–3.3)
MONO#: 0.7 10*3/uL (ref 0.1–0.9)
NEUT%: 51.3 % (ref 39.6–80.0)
Platelets: 108 10*3/uL — ABNORMAL LOW (ref 145–400)
RDW: 15.9 % — ABNORMAL HIGH (ref 11.1–15.7)
WBC: 5.1 10*3/uL (ref 3.9–10.0)

## 2011-06-06 ENCOUNTER — Other Ambulatory Visit: Payer: Self-pay | Admitting: *Deleted

## 2011-06-06 NOTE — Progress Notes (Signed)
Pt called requesting her labs and a letter be sent over to Dr Carlos Levering office for surgery tomorrow. Also received a call from Dr Carlos Levering office asking for the same thing. Faxed letter, CBC report, and most recent office note to [phone].

## 2011-06-16 ENCOUNTER — Telehealth: Payer: Self-pay | Admitting: Hematology & Oncology

## 2011-06-16 NOTE — Telephone Encounter (Signed)
Pt moved 5-6 to 5-13 she is having surgery

## 2011-06-20 ENCOUNTER — Ambulatory Visit: Payer: Medicare Other | Admitting: Hematology & Oncology

## 2011-06-20 ENCOUNTER — Other Ambulatory Visit: Payer: Medicare Other | Admitting: Lab

## 2011-06-27 ENCOUNTER — Other Ambulatory Visit (HOSPITAL_BASED_OUTPATIENT_CLINIC_OR_DEPARTMENT_OTHER): Payer: Medicare Other | Admitting: Lab

## 2011-06-27 ENCOUNTER — Ambulatory Visit (HOSPITAL_BASED_OUTPATIENT_CLINIC_OR_DEPARTMENT_OTHER): Payer: Medicare Other | Admitting: Hematology & Oncology

## 2011-06-27 VITALS — BP 113/70 | HR 78 | Temp 97.4°F | Ht 65.0 in | Wt 153.0 lb

## 2011-06-27 DIAGNOSIS — D696 Thrombocytopenia, unspecified: Secondary | ICD-10-CM

## 2011-06-27 LAB — CBC WITH DIFFERENTIAL (CANCER CENTER ONLY)
BASO#: 0.1 10*3/uL (ref 0.0–0.2)
Eosinophils Absolute: 0.1 10*3/uL (ref 0.0–0.5)
HCT: 39 % (ref 34.8–46.6)
HGB: 12.8 g/dL (ref 11.6–15.9)
LYMPH#: 1.4 10*3/uL (ref 0.9–3.3)
MONO#: 0.6 10*3/uL (ref 0.1–0.9)
NEUT%: 51.4 % (ref 39.6–80.0)
WBC: 4.5 10*3/uL (ref 3.9–10.0)

## 2011-06-27 NOTE — Progress Notes (Signed)
This office note has been dictated.

## 2011-06-28 NOTE — Progress Notes (Signed)
CC:   [name]. Amanda Pea, M.D. Juluis Rainier, MD, Fax [phone] Magnus Sinning) Tenny Craw, M.D.  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  CURRENT THERAPY:  Observation.  INTERIM HISTORY:  [name] comes in for followup.  She got through the surgery for her right hand.  Dr. Amanda Pea did the surgery.  There were no problems with bleeding.  She has a cast on the right hand.  She states that the left hand will be operated on in 1 year.  There have been no problems with fevers, sweats or chills.  She has had no bruising outside that with some ecchymoses on her legs.  She has had no headache.  There has been no cough.  There has been no fever.  PHYSICAL EXAM:  This is an elderly, but well-nourished white female in no obvious distress.  Vital signs:  Temperature of 97.4, pulse 78, respiratory rate 16, blood pressure 113/70, weight is 153.  Head/Neck: Normocephalic, atraumatic skull.  There are no ocular or oral lesions. There are no palpable cervical or supraclavicular lymph nodes.  Lungs: Clear to percussion and auscultation bilaterally.  Cardiac:  Regular rate and rhythm with a normal S1, S2.  There are no murmurs, rubs or bruits.  Abdomen:  Soft with good bowel sounds.  There is no palpable abdominal mass.  There is no palpable hepatosplenomegaly.  Back:  No tenderness over the spine, ribs, or hips.  Extremities:  Cast on the right arm and hand.  She has a splint on the left hand.  Skin:  Some ecchymoses on her lower extremities.  I see no ecchymoses elsewhere. She has some seborrheic keratoses.  LABORATORY STUDIES:  White cell count is 4.5, hemoglobin 12.8, hematocrit 39, platelet count 119.  IMPRESSION:  [name] is a 78 year old white female with thrombocytopenia.  This is chronic.  This is most likely mild immune based.  She is asymptomatic.  She got through the surgery without any problems.  At this point in time, I think we can probably get her  back now in 4 months.  Again, she is not planning to have surgery for another year for her left hand.    ______________________________ Josph Macho, M.D. PRE/MEDQ  D:  06/27/2011  T:  06/28/2011  Job:  [id number]

## 2011-10-28 ENCOUNTER — Ambulatory Visit: Payer: Medicare Other | Admitting: Hematology & Oncology

## 2011-10-28 ENCOUNTER — Ambulatory Visit (HOSPITAL_BASED_OUTPATIENT_CLINIC_OR_DEPARTMENT_OTHER): Payer: Medicare Other | Admitting: Hematology & Oncology

## 2011-10-28 ENCOUNTER — Other Ambulatory Visit (HOSPITAL_BASED_OUTPATIENT_CLINIC_OR_DEPARTMENT_OTHER): Payer: Medicare Other | Admitting: Lab

## 2011-10-28 ENCOUNTER — Other Ambulatory Visit: Payer: Medicare Other | Admitting: Lab

## 2011-10-28 VITALS — BP 125/64 | HR 75 | Temp 98.0°F | Resp 20 | Ht 65.0 in | Wt 163.0 lb

## 2011-10-28 DIAGNOSIS — D696 Thrombocytopenia, unspecified: Secondary | ICD-10-CM

## 2011-10-28 LAB — CBC WITH DIFFERENTIAL (CANCER CENTER ONLY)
BASO%: 0.5 % (ref 0.0–2.0)
HCT: 36.8 % (ref 34.8–46.6)
LYMPH%: 32.2 % (ref 14.0–48.0)
MCH: 30.7 pg (ref 26.0–34.0)
MCV: 94 fL (ref 81–101)
MONO#: 0.7 10*3/uL (ref 0.1–0.9)
MONO%: 12.5 % (ref 0.0–13.0)
NEUT%: 52.1 % (ref 39.6–80.0)
Platelets: 94 10*3/uL — ABNORMAL LOW (ref 145–400)
RDW: 14.6 % (ref 11.1–15.7)

## 2011-10-28 LAB — TECHNOLOGIST REVIEW CHCC SATELLITE

## 2011-10-28 LAB — CHCC SATELLITE - SMEAR

## 2011-10-28 NOTE — Progress Notes (Signed)
This office note has been dictated.

## 2011-10-28 NOTE — Patient Instructions (Signed)
Call if bleeding

## 2011-10-31 NOTE — Progress Notes (Signed)
CC:   Magnus Sinning) Tenny Craw, M.D. Dionne Ano. Amanda Pea, M.D. Juluis Rainier, MD, Fax [phone]  DIAGNOSIS:  Chronic immune thrombocytopenia, mild.  CURRENT THERAPY:  Observation.  INTERIM HISTORY:  Ms. Febo comes in for followup.  She is doing okay. Her right hand is still recovering from her surgery.  She still has some stiffness with this.  There has been no bleeding.  There has been no bruising.  She has had no nausea or vomiting.  She has had no rashes.  There has been no leg swelling.  PHYSICAL EXAMINATION:  This is an elderly but thin white female in no obvious distress.  Vital signs: 98.6, pulse 75, respiratory rate 20, blood pressure 125/64.  Weight is 163.  Head and neck:  Normocephalic, atraumatic skull.  There are no ocular or oral lesions.  There are no palpable cervical or supraclavicular lymph nodes.  Lungs:  Clear bilaterally.  Cardiac:  Regular rate and rhythm with a normal S1, S2. There are no murmurs or rubs, or bruits.  Abdomen:  Soft with good bowel sounds.  She has no palpable abdominal mass.  There is no fluid wave. There is no palpable hepatosplenomegaly.  Back:  No tenderness over the spine, ribs, or hips.  Extremities:  Brace on her right hand.  Skin:  No rashes, ecchymosis or petechia.  LABORATORY STUDIES:  White cell count is 5.9, hemoglobin 12, hematocrit 37, platelet count 94.  IMPRESSION:  Ms. Withers is an 78 year old white female with thrombocytopenia.  She has been totally asymptomatic with this.  Her platelet count does fluctuate.  I have tried to convince her that this is not a problem for her.  She, unfortunately, is just very "negative."  She thinks that she needs to be treated or given something for her platelet count.  I have convinced that this is not a problem and that this is not a condition that needs to be treated at the present time.  We will go ahead and plan to get her back in 3 months' time.  I do not see a need for any kind of  test on her.    ______________________________ Josph Macho, M.D. PRE/MEDQ  D:  10/28/2011  T:  10/29/2011  Job:  [id number]

## 2012-01-27 ENCOUNTER — Ambulatory Visit: Payer: Medicare Other | Admitting: Hematology & Oncology

## 2012-01-27 ENCOUNTER — Other Ambulatory Visit: Payer: Medicare Other | Admitting: Lab

## 2012-01-27 ENCOUNTER — Telehealth: Payer: Self-pay | Admitting: Hematology & Oncology

## 2012-01-27 NOTE — Telephone Encounter (Signed)
Pt moved 12-13 to 1-3. Her husband is in the hospital

## 2012-02-13 ENCOUNTER — Telehealth: Payer: Self-pay | Admitting: Hematology & Oncology

## 2012-02-13 NOTE — Telephone Encounter (Signed)
Pt moved 1-3 to 1-15 she is sick.

## 2012-02-15 HISTORY — PX: HAND SURGERY: SHX662

## 2012-02-17 ENCOUNTER — Ambulatory Visit: Payer: Medicare Other | Admitting: Hematology & Oncology

## 2012-02-17 ENCOUNTER — Other Ambulatory Visit: Payer: Medicare Other | Admitting: Lab

## 2012-02-29 ENCOUNTER — Other Ambulatory Visit (HOSPITAL_BASED_OUTPATIENT_CLINIC_OR_DEPARTMENT_OTHER): Payer: Medicare Other | Admitting: Lab

## 2012-02-29 ENCOUNTER — Ambulatory Visit (HOSPITAL_BASED_OUTPATIENT_CLINIC_OR_DEPARTMENT_OTHER): Payer: Medicare Other | Admitting: Hematology & Oncology

## 2012-02-29 VITALS — BP 123/60 | HR 66 | Temp 98.1°F | Resp 16 | Ht 65.0 in | Wt 164.0 lb

## 2012-02-29 DIAGNOSIS — D693 Immune thrombocytopenic purpura: Secondary | ICD-10-CM

## 2012-02-29 DIAGNOSIS — D696 Thrombocytopenia, unspecified: Secondary | ICD-10-CM

## 2012-02-29 LAB — CBC WITH DIFFERENTIAL (CANCER CENTER ONLY)
BASO#: 0.1 10*3/uL (ref 0.0–0.2)
BASO%: 1.2 % (ref 0.0–2.0)
EOS%: 3.7 % (ref 0.0–7.0)
HGB: 13 g/dL (ref 11.6–15.9)
LYMPH#: 1.5 10*3/uL (ref 0.9–3.3)
MCHC: 32 g/dL (ref 32.0–36.0)
MONO%: 14.4 % — ABNORMAL HIGH (ref 0.0–13.0)
NEUT#: 2.6 10*3/uL (ref 1.5–6.5)
Platelets: 101 10*3/uL — ABNORMAL LOW (ref 145–400)

## 2012-02-29 LAB — CHCC SATELLITE - SMEAR

## 2012-02-29 NOTE — Progress Notes (Signed)
This office note has been dictated.

## 2012-03-01 NOTE — Progress Notes (Signed)
CC:   Magnus Sinning) Tenny Craw, M.D. Dionne Ano. Amanda Pea, M.D. Juluis Rainier, MD, Fax [phone]  DIAGNOSIS:  Chronic immune thrombocytopenia.  CURRENT THERAPY:  Observation.  INTERIM HISTORY:  [name] comes in for followup.  She is doing okay. She has had no bleeding or bruising.  She got through the holidays without any problems.  There have been no fevers, sweats, or chills. She has had a good appetite.  There has been no bony pain.  She does have the serious arthritis.  She had surgery for her right hand which is doing pretty well.  PHYSICAL EXAMINATION:  General:  This is a well-developed, elderly white female in no obvious distress.  Vital signs:  Temperature of 98.1, pulse 66, respiratory rate 16, blood pressure 123/60.  Weight is 164.  Head and neck:  Normocephalic, atraumatic skull.  There are no ocular or oral lesions.  There are no palpable cervical or supraclavicular lymph nodes. Lungs:  Clear bilaterally.  Cardiac:  Regular rate and rhythm with a normal S1 and S2.  There are no murmurs, rubs, or bruits.  Abdomen: Soft with good bowel sounds.  There is no palpable abdominal mass. There is no palpable hepatosplenomegaly.  Back:  No tenderness over the spine, ribs, or hips.  Extremities:  No clubbing, cyanosis, or edema. She has the arthritic changes in her joints.  LABORATORY STUDIES:  White cell count is 5.1, hemoglobin 13, hematocrit 41, platelet count is 101,000.  IMPRESSION:  [name] is a very nice 79 year old white female with chronic immune thrombocytopenia.  She is doing okay.  Her platelet count tends to fluctuate.  As always, she is totally asymptomatic.  We will go ahead and plan to get her back in another 4 months.  I think this is reasonable followup for her.  I do not see any need for blood work in between visits.    ______________________________ Josph Macho, M.D. PRE/MEDQ  D:  02/29/2012  T:  03/01/2012  Job:  [id number]

## 2012-06-27 ENCOUNTER — Ambulatory Visit: Payer: Medicare Other | Admitting: Hematology & Oncology

## 2012-06-27 ENCOUNTER — Ambulatory Visit (HOSPITAL_BASED_OUTPATIENT_CLINIC_OR_DEPARTMENT_OTHER): Payer: Medicare Other | Admitting: Lab

## 2012-06-27 ENCOUNTER — Ambulatory Visit (HOSPITAL_BASED_OUTPATIENT_CLINIC_OR_DEPARTMENT_OTHER): Payer: Medicare Other | Admitting: Hematology & Oncology

## 2012-06-27 ENCOUNTER — Other Ambulatory Visit: Payer: Medicare Other | Admitting: Lab

## 2012-06-27 VITALS — BP 117/59 | HR 78 | Temp 98.6°F | Resp 16 | Ht 65.0 in | Wt 176.0 lb

## 2012-06-27 DIAGNOSIS — L0390 Cellulitis, unspecified: Secondary | ICD-10-CM

## 2012-06-27 DIAGNOSIS — D696 Thrombocytopenia, unspecified: Secondary | ICD-10-CM

## 2012-06-27 DIAGNOSIS — D693 Immune thrombocytopenic purpura: Secondary | ICD-10-CM

## 2012-06-27 DIAGNOSIS — L539 Erythematous condition, unspecified: Secondary | ICD-10-CM

## 2012-06-27 LAB — TECHNOLOGIST REVIEW CHCC SATELLITE

## 2012-06-27 LAB — CBC WITH DIFFERENTIAL (CANCER CENTER ONLY)
BASO%: 0.6 % (ref 0.0–2.0)
Eosinophils Absolute: 0.2 10*3/uL (ref 0.0–0.5)
LYMPH#: 1.4 10*3/uL (ref 0.9–3.3)
MONO#: 0.6 10*3/uL (ref 0.1–0.9)
NEUT#: 3 10*3/uL (ref 1.5–6.5)
Platelets: 102 10*3/uL — ABNORMAL LOW (ref 145–400)
RBC: 3.85 10*6/uL (ref 3.70–5.32)
RDW: 14.5 % (ref 11.1–15.7)
WBC: 5.2 10*3/uL (ref 3.9–10.0)

## 2012-06-27 MED ORDER — AMOXICILLIN-POT CLAVULANATE 875-125 MG PO TABS: 1.0000 | ORAL_TABLET | Freq: Two times a day (BID) | ORAL | Status: DC

## 2012-06-27 NOTE — Progress Notes (Signed)
This office note has been dictated.

## 2012-06-28 NOTE — Progress Notes (Signed)
CC:   Magnus Sinning) Tenny Craw, M.D. Juluis Rainier, MD, Fax [phone]  DIAGNOSIS:  Chronic immune thrombocytopenia.  CURRENT THERAPY:  Observation.  INTERIM HISTORY:  [name] comes in for followup.  She is doing okay. Her only problem is that she fell a couple weeks ago.  She really landed hard on her right leg.  She has a large ecchymoses on the right lower leg.  There is also an area of erythema and warmth about her knee.  Otherwise, she seems to be doing okay.  She has had no obvious bleeding. She has had no change in bowel or bladder habits.  She has had no nausea or vomiting.  She has severe arthritis in her joints.  She is not going to have any further surgery for her hands.  PHYSICAL EXAMINATION:  General:  This is a well-developed, well- nourished, white female in no obvious distress.  Vital Signs: Temperature of 98.6, pulse 78, respiratory rate 16, blood pressure 117/60.  Weight is 176.  Head and Neck:  Normocephalic, atraumatic skull.  There are no ocular or oral lesions.  There are no palpable masses, cervical, or supraclavicular lymph nodes.  Lungs:  Clear bilaterally.  Cardiac:  Regular rate and rhythm with a normal S1 and S2. She has no murmurs, rubs, or bruits.  Abdomen:  Soft with good bowel sounds.  There is no palpable abdominal mass.  There is no fluid wave. There is no palpable hepatosplenomegaly.  Extremities:  Ecchymoses and area of possible cellulitis about the right knee.  She has decent range of motion of the right knee.  There is no fluid about the right knee. Left lower leg is unremarkable.  Neurological:  No focal neurological deficits.  LABORATORY STUDIES:  White cell count is 5.2, hemoglobin 12.1, hematocrit 37.6, platelet count 102.  IMPRESSION:  [name] is an 79 year old white female with chronic immune thrombocytopenia.  This is mild at best.  We will continue to follow her along.  She does not need any therapy for the thrombocytopenia.  I  will give her some Augmentin to take for 7 days for this area of her right knee.  I will plan to get her back to see me in another 4 months.  We will try to get through the summertime now.   ______________________________ Josph Macho, M.D. PRE/MEDQ  D:  06/27/2012  T:  06/28/2012  Job:  [id number]

## 2012-07-19 ENCOUNTER — Telehealth: Payer: Self-pay | Admitting: Cardiovascular Disease

## 2012-07-19 ENCOUNTER — Other Ambulatory Visit: Payer: Self-pay | Admitting: Urology

## 2012-07-19 NOTE — Telephone Encounter (Signed)
PM letter completed and OK to hold ASA for 5 days.

## 2012-07-19 NOTE — Telephone Encounter (Signed)
Returned call and left message that form faxed and instructions by MD/PA.  Call back with any questions or concerns.

## 2012-07-19 NOTE — Telephone Encounter (Signed)
Need to know if she can stop her aspirin for 5 days prior to Lithothripsy? Will also fax a form  over concerning her pacemaker-will need Dr C to sign it!

## 2012-07-19 NOTE — Telephone Encounter (Signed)
Paper chart taken back to Dr. Erin Hearing cart for review and signature.  Left message w/ Pam that form received.  Message forwarded to Dr. Royann Shivers.

## 2012-07-23 ENCOUNTER — Encounter (HOSPITAL_COMMUNITY): Payer: Self-pay | Admitting: Pharmacy Technician

## 2012-07-24 ENCOUNTER — Encounter (HOSPITAL_COMMUNITY): Payer: Self-pay | Admitting: *Deleted

## 2012-07-24 NOTE — Progress Notes (Addendum)
Called [employer] ([email or web address]-[employer]) to arrange for pacemaker representative to be available for 07/26/2012 0845 ithotripsy. Representative is to call back.  1205  Jana Half, [employer] rep called back. She will arrange for a representative to be available for lithotripsy.   1215   Called Dr Belva Crome office and left message with Dr Belva Crome office about patients low platelet count. Spoke with Alcario Drought RN who stated she would have Dr Annabell Howells look at labs when he returns on 07/27/12.

## 2012-07-25 ENCOUNTER — Other Ambulatory Visit: Payer: Self-pay | Admitting: Urology

## 2012-07-25 NOTE — H&P (Signed)
istory of Present Illness  [name] is a 79 yo WF sent in consultation by Dr. Tenny Craw for gross hematuria.  She had the onset of gross hematuria 8 days ago and she had bloody urine for about 8 days.  She had no dysuria and had no flank pain.  She has not had prior bleeding.  She denies stones and has only had one prior UTI remotely.  She denies GU surgery.  She has some mild vaginal burning.  Her culture was negative from last week and she took fluconazole for presumed yeast.  She does have hemorrhoids but she is sure the blood was in the urine.   She has a diagnosis of hyperparathyroidism but has not been treated surgically.   Past Medical History Problems  1. History of  Anxiety (Symptom) 300.00 2. History of  Atrial Fibrillation 427.31 3. History of  Esophageal Reflux 530.81 4. History of  Gout 274.9 5. History of  Hypercholesterolemia 272.0 6. History of  Hyperparathyroidism 252.00 7. History of  Hypertension 401.9 8. History of  Lower Back Pain 724.2 9. History of  Osteoarthritis V13.4 10. History of  Osteoporosis 733.00 11. History of  Thrombocytopenia 287.5 12. 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 History of  Varicose Veins 454.9 14. History of  Vitamin D Deficiency 268.9  Surgical History Problems  1. History of  Hand Surgery Right 2. History of  Hysterectomy V45.77 3. History of  Mitral Valve Repair 4. History of  Pacemaker Placement  Current Meds 1. Alendronate Sodium 70 MG Oral Tablet; Therapy: (Recorded:03Jun2014) to 2. ALPRAZolam 0.5 MG Oral Tablet; Therapy: (Recorded:03Jun2014) to 3. Aspirin 81 MG Oral Tablet; Therapy: (Recorded:03Jun2014) to 4. Azor 5-20 MG Oral Tablet; Therapy: (Recorded:03Jun2014) to 5. Bactroban OINT; Therapy: (Recorded:03Jun2014) to 6. Diprolene AF 0.05 % External Cream; Therapy: (Recorded:03Jun2014) to 7. Furosemide 40 MG Oral Tablet; Therapy: (Recorded:03Jun2014) to 8. Metoprolol Tartrate 25 MG Oral Tablet; Therapy:  (Recorded:03Jun2014) to 9. Multi-Vitamin TABS; Therapy: (Recorded:03Jun2014) to 10. Omeprazole 20 MG Oral Capsule Delayed Release; Therapy: (Recorded:03Jun2014) to 11. Potassium Chloride Crys ER 20 MEQ Oral Tablet Extended Release; Therapy:   (Recorded:03Jun2014) to 12. Simvastatin 20 MG Oral Tablet; Therapy: (Recorded:03Jun2014) to 13. Vitamin B Complex TABS; Therapy: (Recorded:03Jun2014) to 14. Vitamin C TABS; Therapy: (Recorded:03Jun2014) to 15. Vitamin D3 1000 UNIT Oral Capsule; Therapy: (Recorded:03Jun2014) to  Allergies Medication  1. Zantac CAPS  Family History Problems  1. Family history of  Death In The Family Father heart 2. Family history of  Death In The Family Mother melanoma 3. Paternal history of  Diabetes Mellitus V18.0 4. Family history of  Family Health Status Number Of Children 1 son  1 daughter  Social History Problems    Marital History - Currently Married   Never A Smoker   Occupation: Retired Denied    History of  Alcohol Use   History of  Caffeine Use   History of  Tobacco Use 305.1  Review of Systems Genitourinary, constitutional, skin, eye, otolaryngeal, hematologic/lymphatic, cardiovascular, pulmonary, endocrine, musculoskeletal, gastrointestinal, neurological and psychiatric system(s) were reviewed and pertinent findings if present are noted.  Genitourinary: urinary urgency, incontinence and hematuria.  Musculoskeletal: back pain.  Neurological: dizziness.  Psychiatric: anxiety.    Vitals Vital Signs [Data Includes: Last 1 Day]  03Jun2014 10:24AM  BMI Calculated: 28.32 BSA Calculated: 1.85 Height: 5 ft 5 in Weight: 170 lb  Blood Pressure: 110 / 71 Temperature: 98.9 F Heart Rate: 72  Physical Exam Constitutional: Well nourished and well developed . No  acute distress.  ENT:. The ears and nose are normal in appearance.  Neck: The appearance of the neck is normal and no neck mass is present.  Pulmonary: No respiratory distress and  normal respiratory rhythm and effort.  Cardiovascular: Heart rate and rhythm are normal . No peripheral edema.  Abdomen: The abdomen is soft and nontender. No masses are palpated. No CVA tenderness. No hernias are palpable. No hepatosplenomegaly noted.  Lymphatics: The supraclavicular, axillary, femoral and inguinal nodes are not enlarged or tender.  Skin: Normal skin turgor, no visible rash and no visible skin lesions.  Neuro/Psych:. Mood and affect are appropriate. No focal sensory deficits. No motor deficits.    Results/Data Urine [Data Includes: Last 1 Day]   03Jun2014  COLOR YELLOW   APPEARANCE CLOUDY   SPECIFIC GRAVITY 1.020   pH 6.0   GLUCOSE NEG mg/dL  BILIRUBIN SMALL   KETONE NEG mg/dL  BLOOD LARGE   PROTEIN 100 mg/dL  UROBILINOGEN 0.2 mg/dL  NITRITE NEG   LEUKOCYTE ESTERASE NEG   SQUAMOUS EPITHELIAL/HPF RARE   WBC NONE SEEN WBC/hpf  RBC TNTC RBC/hpf  BACTERIA RARE   CRYSTALS NONE SEEN   CASTS NONE SEEN    Old records or history reviewed: Labs and records from Dr. Tenny Craw reviewed.  The following images/tracing/specimen were independently visualized:  CT hematuria study shows a non-obstructing 10mm left renal pelvic stone. She has a small right renal stone. There are no bladder or renal lesions noted.    Assessment Assessed  1. Nephrolithiasis Of Both Kidneys 592.0 2. Gross Hematuria 599.71   She has bilateral stones with a 1cm left renal pelvic stone which is the probable cause of the bleeding.   Plan Gross Hematuria (599.71)  1. AU CT-HEMATURIA PROTOCOL  Done: 03Jun2014 12:00AM 2. BUN & CREATININE  Done: 03Jun2014 10:46AM Health Maintenance (V70.0)  3. UA With REFLEX  Done: 03Jun2014 10:00AM Nephrolithiasis Of Both Kidneys (592.0)  4. Follow-up Schedule Surgery Office  Follow-up  Requested for: 03Jun2014   I discussed ESWL, ureteroscopy and PCNL and believe that her best option is ESWL and reviewed the risks of bleeding, infection, renal and adjacent organ  injury, need for secondary procedures, thrombotic events and sedation risks. I will get cardiac clearance for her.   Discussion/Summary  CC: Dr. Vianne Bulls.

## 2012-07-26 ENCOUNTER — Ambulatory Visit (HOSPITAL_COMMUNITY): Payer: Medicare Other

## 2012-07-26 ENCOUNTER — Encounter (HOSPITAL_COMMUNITY): Payer: Self-pay | Admitting: Anesthesiology

## 2012-07-26 ENCOUNTER — Ambulatory Visit (HOSPITAL_COMMUNITY)
Admission: RE | Admit: 2012-07-26 | Discharge: 2012-07-26 | Disposition: A | Discharge status: Home / Self Care | Payer: Medicare Other | Source: Ambulatory Visit | Attending: Urology | Admitting: Urology

## 2012-07-26 ENCOUNTER — Ambulatory Visit (HOSPITAL_COMMUNITY): Payer: Medicare Other | Admitting: Anesthesiology

## 2012-07-26 ENCOUNTER — Encounter (HOSPITAL_COMMUNITY): Payer: Self-pay | Admitting: General Practice

## 2012-07-26 ENCOUNTER — Encounter (HOSPITAL_COMMUNITY)
Admission: RE | Disposition: A | Discharge status: Home / Self Care | Payer: Self-pay | Source: Ambulatory Visit | Attending: Urology

## 2012-07-26 DIAGNOSIS — K219 Gastro-esophageal reflux disease without esophagitis: Secondary | ICD-10-CM | POA: Insufficient documentation

## 2012-07-26 DIAGNOSIS — M810 Age-related osteoporosis without current pathological fracture: Secondary | ICD-10-CM | POA: Insufficient documentation

## 2012-07-26 DIAGNOSIS — E213 Hyperparathyroidism, unspecified: Secondary | ICD-10-CM | POA: Insufficient documentation

## 2012-07-26 DIAGNOSIS — Z79899 Other long term (current) drug therapy: Secondary | ICD-10-CM | POA: Insufficient documentation

## 2012-07-26 DIAGNOSIS — Z7982 Long term (current) use of aspirin: Secondary | ICD-10-CM | POA: Insufficient documentation

## 2012-07-26 DIAGNOSIS — Z950 Presence of cardiac pacemaker: Secondary | ICD-10-CM | POA: Insufficient documentation

## 2012-07-26 DIAGNOSIS — Z8701 Personal history of pneumonia (recurrent): Secondary | ICD-10-CM | POA: Insufficient documentation

## 2012-07-26 DIAGNOSIS — I10 Essential (primary) hypertension: Secondary | ICD-10-CM | POA: Insufficient documentation

## 2012-07-26 DIAGNOSIS — N201 Calculus of ureter: Secondary | ICD-10-CM | POA: Insufficient documentation

## 2012-07-26 DIAGNOSIS — I252 Old myocardial infarction: Secondary | ICD-10-CM | POA: Insufficient documentation

## 2012-07-26 HISTORY — PX: EXTRACORPOREAL SHOCK WAVE LITHOTRIPSY: SHX1557

## 2012-07-26 HISTORY — DX: Essential (primary) hypertension: I10

## 2012-07-26 HISTORY — DX: Localized edema: R60.0

## 2012-07-26 HISTORY — DX: Venous insufficiency (chronic) (peripheral): I87.2

## 2012-07-26 HISTORY — DX: Heart failure, unspecified: I50.9

## 2012-07-26 HISTORY — DX: Pneumonia, unspecified organism: J18.9

## 2012-07-26 HISTORY — DX: Personal history of other diseases of the digestive system: Z87.19

## 2012-07-26 SURGERY — LITHOTRIPSY, ESWL
Anesthesia: Monitor Anesthesia Care | Laterality: Left

## 2012-07-26 MED ORDER — DIPHENHYDRAMINE HCL 25 MG PO CAPS: 25.0000 mg | ORAL_CAPSULE | ORAL | Status: DC

## 2012-07-26 MED ORDER — SODIUM CHLORIDE 0.9 % IJ SOLN: 3.0000 mL | INTRAMUSCULAR | Status: DC | PRN

## 2012-07-26 MED ORDER — SODIUM CHLORIDE 0.9 % IJ SOLN: 3.0000 mL | Freq: Two times a day (BID) | INTRAMUSCULAR | Status: DC

## 2012-07-26 MED ORDER — SODIUM CHLORIDE 0.9 % IV SOLN: 250.0000 mL | INTRAVENOUS | Status: DC | PRN

## 2012-07-26 MED ORDER — CIPROFLOXACIN HCL 500 MG PO TABS
500.0000 mg | ORAL_TABLET | ORAL | Status: AC
  Administered 2012-07-26: 500 mg via ORAL
  Filled 2012-07-26: qty 1

## 2012-07-26 MED ORDER — HYDROCODONE-ACETAMINOPHEN 5-325 MG PO TABS: 1.0000 | ORAL_TABLET | Freq: Four times a day (QID) | ORAL | Status: DC | PRN

## 2012-07-26 MED ORDER — DIAZEPAM 5 MG PO TABS: 10.0000 mg | ORAL_TABLET | ORAL | Status: DC

## 2012-07-26 MED ORDER — ACETAMINOPHEN 650 MG RE SUPP: 650.0000 mg | RECTAL | Status: DC | PRN

## 2012-07-26 MED ORDER — LACTATED RINGERS IV SOLN
INTRAVENOUS | Status: DC
  Administered 2012-07-26: 20 mL/h via INTRAVENOUS

## 2012-07-26 MED ORDER — ONDANSETRON 4 MG PO TBDP: 4.0000 mg | ORAL_TABLET | Freq: Three times a day (TID) | ORAL | Status: DC | PRN

## 2012-07-26 MED ORDER — OXYCODONE HCL 5 MG PO TABS: 5.0000 mg | ORAL_TABLET | ORAL | Status: DC | PRN

## 2012-07-26 MED ORDER — ONDANSETRON HCL 4 MG/2ML IJ SOLN: 4.0000 mg | Freq: Four times a day (QID) | INTRAMUSCULAR | Status: DC | PRN

## 2012-07-26 MED ORDER — ACETAMINOPHEN 325 MG PO TABS: 650.0000 mg | ORAL_TABLET | ORAL | Status: DC | PRN

## 2012-07-26 MED ORDER — FENTANYL CITRATE 0.05 MG/ML IJ SOLN: 25.0000 ug | INTRAMUSCULAR | Status: DC | PRN

## 2012-07-26 MED ORDER — DEXTROSE-NACL 5-0.45 % IV SOLN: INTRAVENOUS | Status: DC

## 2012-07-26 NOTE — Interval H&P Note (Signed)
History and Physical Interval Note:  07/26/2012 9:33 AM  [name]  has presented today for surgery, with the diagnosis of LEFT RENAL STONE  The various methods of treatment have been discussed with the patient and family. After consideration of risks, benefits and other options for treatment, the patient has consented to  Procedure(s): LEFT EXTRACORPOREAL SHOCK WAVE LITHOTRIPSY (ESWL) (Left) as a surgical intervention .  The patient's history has been reviewed, patient examined, no change in status, stable for surgery.  I have reviewed the patient's chart and labs.  Questions were answered to the patient's satisfaction.     Arayah Krouse J

## 2012-07-26 NOTE — Anesthesia Preprocedure Evaluation (Deleted)
Anesthesia Evaluation  Patient identified by MRN, date of birth, ID band Patient awake    Reviewed: Allergy & Precautions, H&P , NPO status , Patient's Chart, lab work & pertinent test results, reviewed documented beta blocker date and time   Airway Mallampati: II TM Distance: >3 FB Neck ROM: Full    Dental  (+) Dental Advisory Given   Pulmonary pneumonia -, resolved,  breath sounds clear to auscultation        Cardiovascular hypertension, Pt. on medications and Pt. on home beta blockers + Peripheral Vascular Disease and +CHF + dysrhythmias Atrial Fibrillation + pacemaker + Valvular Problems/Murmurs AI Rhythm:Irregular Rate:Normal  Echo 2011  - Left ventricle: The cavity size was normal. Systolic function was mildly to moderately reduced. The estimated ejection fraction was in the range of 40% to 45%. Diffuse hypokinesis. There is profound systolic asynchrony and paradoxical septal motion related to RV apical pacing. Mitral valve replacement precludes evaluation of LV diastolic function.  - Aortic valve: Mild regurgitation.  - Mitral valve: A stented mitral bioprosthetic valve is seen. The leaflets move normally. Cannot exclude vegetations. No evidence for periannular abscess is seen. The peak and mean gradients are 22 and 8 mm hg, respectively, at a mean heart rate of 80 bpm.   Valve area by pressure half-time: 1.49cm^2.  - Left atrium: The atrium was moderately dilated.  - Pericardium, extracardiac: There is a small to moderate pericardial effusion, predominantly located posteriorly. Tamponade is not seen.    Neuro/Psych negative neurological ROS  negative psych ROS   GI/Hepatic Neg liver ROS, hiatal hernia,   Endo/Other  negative endocrine ROS  Renal/GU Renal disease     Musculoskeletal negative musculoskeletal ROS (+)   Abdominal   Peds  Hematology negative hematology ROS (+)   Anesthesia Other Findings   Reproductive/Obstetrics negative OB ROS                          Anesthesia Physical Anesthesia Plan  ASA: III  Anesthesia Plan: MAC   Post-op Pain Management:    Induction: Intravenous  Airway Management Planned:   Additional Equipment:   Intra-op Plan:   Post-operative Plan:   Informed Consent: I have reviewed the patients History and Physical, chart, labs and discussed the procedure including the risks, benefits and alternatives for the proposed anesthesia with the patient or authorized representative who has indicated his/her understanding and acceptance.   Dental advisory given  Plan Discussed with: CRNA  Anesthesia Plan Comments:         Anesthesia Quick Evaluation

## 2012-07-31 ENCOUNTER — Inpatient Hospital Stay (HOSPITAL_COMMUNITY)
Admission: AD | Admit: 2012-07-31 | Discharge: 2012-08-02 | DRG: 694 | Disposition: A | Discharge status: Home / Self Care | Payer: Medicare Other | Source: Ambulatory Visit | Attending: Urology | Admitting: Urology

## 2012-07-31 ENCOUNTER — Encounter (HOSPITAL_COMMUNITY): Payer: Self-pay | Admitting: *Deleted

## 2012-07-31 DIAGNOSIS — E875 Hyperkalemia: Secondary | ICD-10-CM | POA: Diagnosis present

## 2012-07-31 DIAGNOSIS — M810 Age-related osteoporosis without current pathological fracture: Secondary | ICD-10-CM | POA: Diagnosis present

## 2012-07-31 DIAGNOSIS — M1990 Unspecified osteoarthritis, unspecified site: Secondary | ICD-10-CM | POA: Diagnosis present

## 2012-07-31 DIAGNOSIS — D696 Thrombocytopenia, unspecified: Secondary | ICD-10-CM | POA: Diagnosis present

## 2012-07-31 DIAGNOSIS — K219 Gastro-esophageal reflux disease without esophagitis: Secondary | ICD-10-CM | POA: Diagnosis present

## 2012-07-31 DIAGNOSIS — Z79899 Other long term (current) drug therapy: Secondary | ICD-10-CM

## 2012-07-31 DIAGNOSIS — N201 Calculus of ureter: Secondary | ICD-10-CM | POA: Diagnosis present

## 2012-07-31 DIAGNOSIS — N179 Acute kidney failure, unspecified: Secondary | ICD-10-CM | POA: Diagnosis present

## 2012-07-31 DIAGNOSIS — F411 Generalized anxiety disorder: Secondary | ICD-10-CM | POA: Diagnosis present

## 2012-07-31 DIAGNOSIS — N289 Disorder of kidney and ureter, unspecified: Secondary | ICD-10-CM | POA: Diagnosis present

## 2012-07-31 DIAGNOSIS — E213 Hyperparathyroidism, unspecified: Secondary | ICD-10-CM | POA: Diagnosis present

## 2012-07-31 DIAGNOSIS — E7800 Pure hypercholesterolemia, unspecified: Secondary | ICD-10-CM | POA: Diagnosis present

## 2012-07-31 DIAGNOSIS — E559 Vitamin D deficiency, unspecified: Secondary | ICD-10-CM | POA: Diagnosis present

## 2012-07-31 DIAGNOSIS — Z7982 Long term (current) use of aspirin: Secondary | ICD-10-CM

## 2012-07-31 DIAGNOSIS — I4891 Unspecified atrial fibrillation: Secondary | ICD-10-CM | POA: Diagnosis present

## 2012-07-31 DIAGNOSIS — I10 Essential (primary) hypertension: Secondary | ICD-10-CM | POA: Diagnosis present

## 2012-07-31 DIAGNOSIS — Z950 Presence of cardiac pacemaker: Secondary | ICD-10-CM

## 2012-07-31 LAB — CBC
MCH: 30.9 pg (ref 26.0–34.0)
MCHC: 33.3 g/dL (ref 30.0–36.0)
MCV: 92.8 fL (ref 78.0–100.0)
Platelets: ADEQUATE 10*3/uL (ref 150–400)
RBC: 3.88 MIL/uL (ref 3.87–5.11)

## 2012-07-31 MED ORDER — ALENDRONATE SODIUM 10 MG PO TABS: 10.0000 mg | ORAL_TABLET | Freq: Every day | ORAL | Status: DC

## 2012-07-31 MED ORDER — MORPHINE SULFATE 2 MG/ML IJ SOLN
2.0000 mg | INTRAMUSCULAR | Status: DC | PRN
  Administered 2012-07-31 – 2012-08-01 (×4): 2 mg via INTRAVENOUS
  Administered 2012-08-01: 4 mg via INTRAVENOUS
  Filled 2012-07-31 (×2): qty 1
  Filled 2012-07-31: qty 2
  Filled 2012-07-31 (×2): qty 1

## 2012-07-31 MED ORDER — AMLODIPINE-OLMESARTAN 5-20 MG PO TABS: 1.0000 | ORAL_TABLET | Freq: Every morning | ORAL | Status: DC

## 2012-07-31 MED ORDER — HYOSCYAMINE SULFATE 0.125 MG SL SUBL
0.1250 mg | SUBLINGUAL_TABLET | SUBLINGUAL | Status: DC | PRN
  Filled 2012-07-31: qty 1

## 2012-07-31 MED ORDER — ALPRAZOLAM 0.5 MG PO TABS
0.5000 mg | ORAL_TABLET | Freq: Every evening | ORAL | Status: DC | PRN
  Administered 2012-08-01 (×2): 0.5 mg via ORAL
  Filled 2012-07-31 (×2): qty 1

## 2012-07-31 MED ORDER — POTASSIUM CHLORIDE CRYS ER 20 MEQ PO TBCR
20.0000 meq | EXTENDED_RELEASE_TABLET | Freq: Every day | ORAL | Status: DC
  Administered 2012-07-31: 20 meq via ORAL
  Filled 2012-07-31 (×2): qty 1

## 2012-07-31 MED ORDER — DEXTROSE 5 % IV SOLN
1.0000 g | INTRAVENOUS | Status: DC
  Administered 2012-08-01: 1 g via INTRAVENOUS
  Filled 2012-07-31 (×2): qty 10

## 2012-07-31 MED ORDER — VITAMIN D3 25 MCG (1000 UNIT) PO TABS
2000.0000 [IU] | ORAL_TABLET | Freq: Two times a day (BID) | ORAL | Status: DC
  Administered 2012-07-31 – 2012-08-02 (×4): 2000 [IU] via ORAL
  Filled 2012-07-31 (×5): qty 2

## 2012-07-31 MED ORDER — SODIUM CHLORIDE 0.9 % IV SOLN
INTRAVENOUS | Status: DC
  Administered 2012-07-31 – 2012-08-02 (×4): via INTRAVENOUS

## 2012-07-31 MED ORDER — SENNOSIDES-DOCUSATE SODIUM 8.6-50 MG PO TABS
1.0000 | ORAL_TABLET | Freq: Every evening | ORAL | Status: DC | PRN
  Filled 2012-07-31: qty 1

## 2012-07-31 MED ORDER — HEPARIN SODIUM (PORCINE) 5000 UNIT/ML IJ SOLN
5000.0000 [IU] | Freq: Three times a day (TID) | INTRAMUSCULAR | Status: DC
  Administered 2012-07-31 – 2012-08-01 (×2): 5000 [IU] via SUBCUTANEOUS
  Filled 2012-07-31 (×5): qty 1

## 2012-07-31 MED ORDER — TAMSULOSIN HCL 0.4 MG PO CAPS
0.4000 mg | ORAL_CAPSULE | Freq: Every morning | ORAL | Status: DC
  Administered 2012-08-02: 0.4 mg via ORAL
  Filled 2012-07-31 (×2): qty 1

## 2012-07-31 MED ORDER — DEXTROSE 5 % IV SOLN
1.0000 g | INTRAVENOUS | Status: AC
  Administered 2012-07-31: 1 g via INTRAVENOUS
  Filled 2012-07-31: qty 10

## 2012-07-31 MED ORDER — AMLODIPINE BESYLATE 5 MG PO TABS
5.0000 mg | ORAL_TABLET | Freq: Every day | ORAL | Status: DC
  Filled 2012-07-31: qty 1

## 2012-07-31 MED ORDER — ONDANSETRON HCL 4 MG/2ML IJ SOLN: 4.0000 mg | INTRAMUSCULAR | Status: DC | PRN

## 2012-07-31 MED ORDER — FUROSEMIDE 40 MG PO TABS
40.0000 mg | ORAL_TABLET | Freq: Every day | ORAL | Status: DC
  Administered 2012-07-31 – 2012-08-02 (×3): 40 mg via ORAL
  Filled 2012-07-31 (×3): qty 1

## 2012-07-31 MED ORDER — ACETAMINOPHEN 325 MG PO TABS: 650.0000 mg | ORAL_TABLET | ORAL | Status: DC | PRN

## 2012-07-31 MED ORDER — METOPROLOL TARTRATE 12.5 MG HALF TABLET
12.5000 mg | ORAL_TABLET | Freq: Two times a day (BID) | ORAL | Status: DC
  Administered 2012-07-31 – 2012-08-02 (×4): 12.5 mg via ORAL
  Filled 2012-07-31 (×5): qty 1

## 2012-07-31 MED ORDER — ADULT MULTIVITAMIN W/MINERALS CH
1.0000 | ORAL_TABLET | Freq: Every day | ORAL | Status: DC
  Administered 2012-07-31 – 2012-08-02 (×3): 1 via ORAL
  Filled 2012-07-31 (×3): qty 1

## 2012-07-31 MED ORDER — OLMESARTAN MEDOXOMIL 20 MG PO TABS
20.0000 mg | ORAL_TABLET | Freq: Every day | ORAL | Status: DC
  Filled 2012-07-31: qty 1

## 2012-07-31 MED ORDER — SIMVASTATIN 20 MG PO TABS
20.0000 mg | ORAL_TABLET | Freq: Every day | ORAL | Status: DC
  Administered 2012-07-31 – 2012-08-01 (×2): 20 mg via ORAL
  Filled 2012-07-31 (×3): qty 1

## 2012-07-31 MED ORDER — HYDROCODONE-ACETAMINOPHEN 5-325 MG PO TABS
1.0000 | ORAL_TABLET | ORAL | Status: DC | PRN
  Administered 2012-08-01: 2 via ORAL
  Filled 2012-07-31: qty 2

## 2012-07-31 NOTE — H&P (Signed)
Reason For Visit  acute onset of left flank pain   History of Present Illness         [name] is a 79 yo WF patient of Dr. Annabell Howells sent in consultation by Dr. Tenny Craw for 8 day hx of gross hematuria and found to have large 8 x8x7 mm left UPJ stone.  She underwent an ESWL on June 12  She has a diagnosis of hyperparathyroidism but has not been treated surgically.   Interval Hx:  [name] returns today w/ acute onset of left flank pain that started yesterday and associated w/ nausea, unable to tolerate fluid, food, or taking meds today.  She also has low grade fever 99.16F. Pain grade 8 out of 10 scale, constant, throbbing, non-radiating.   She was started on Flomax yesterday.  Denies any other associated symptoms  or alleviating / aggravating factors   Past Medical History Problems  1. History of  Anxiety (Symptom) 300.00 2. History of  Atrial Fibrillation 427.31 3. History of  Esophageal Reflux 530.81 4. History of  Gout 274.9 5. History of  Hypercholesterolemia 272.0 6. History of  Hyperparathyroidism 252.00 7. History of  Hypertension 401.9 8. History of  Lower Back Pain 724.2 9. History of  Osteoarthritis V13.4 10. History of  Osteoporosis 733.00 11. History of  Thrombocytopenia 287.5 12. 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 History of  Varicose Veins 454.9 14. History of  Vitamin D Deficiency 268.9  Surgical History Problems  1. History of  Hand Surgery Right 2. History of  Hysterectomy V45.77 3. History of  Lithotripsy 4. History of  Mitral Valve Repair 5. History of  Pacemaker Placement  Current Meds 1. Alendronate Sodium 70 MG Oral Tablet; Therapy: (Recorded:03Jun2014) to 2. ALPRAZolam 0.5 MG Oral Tablet; Therapy: (Recorded:03Jun2014) to 3. Aspirin 81 MG Oral Tablet; Therapy: (Recorded:03Jun2014) to 4. Azor 5-20 MG Oral Tablet; Therapy: (Recorded:03Jun2014) to 5. Bactroban OINT; Therapy: (Recorded:03Jun2014) to 6. Diprolene AF 0.05 % External Cream;  Therapy: (Recorded:03Jun2014) to 7. Furosemide 40 MG Oral Tablet; Therapy: (Recorded:03Jun2014) to 8. Metoprolol Tartrate 25 MG Oral Tablet; Therapy: (Recorded:03Jun2014) to 9. Multi-Vitamin TABS; Therapy: (Recorded:03Jun2014) to 10. Omeprazole 20 MG Oral Capsule Delayed Release; Therapy: (Recorded:03Jun2014) to 11. Potassium Chloride Crys ER 20 MEQ Oral Tablet Extended Release; Therapy:   (Recorded:03Jun2014) to 12. Simvastatin 20 MG Oral Tablet; Therapy: (Recorded:03Jun2014) to 13. Tamsulosin HCl 0.4 MG Oral Capsule; TAKE 1 CAPSULE Daily; Therapy: 16Jun2014 to   (Evaluate:16Jul2014)  Requested for: 17Jun2014; Last Rx:16Jun2014 14. Vitamin B Complex TABS; Therapy: (Recorded:03Jun2014) to 15. Vitamin C TABS; Therapy: (Recorded:03Jun2014) to 16. Vitamin D3 1000 UNIT Oral Capsule; Therapy: (Recorded:03Jun2014) to  Allergies Medication  1. Zantac CAPS  Family History Problems  1. Family history of  Death In The Family Father heart 2. Family history of  Death In The Family Mother melanoma 3. Paternal history of  Diabetes Mellitus V18.0 4. Family history of  Family Health Status Number Of Children 1 son  1 daughter  Social History Problems  1. Marital History - Currently Married 2. Never A Smoker 3. Occupation: Retired Denied  4. History of  Alcohol Use 5. History of  Caffeine Use 6. History of  Tobacco Use 305.1  Review of Systems Genitourinary system(s) were reviewed and pertinent findings if present are noted.  Genitourinary: no urinary frequency, no urinary urgency, no dysuria, no nocturia, no hematuria and no suprapubic pain.    Vitals Vital Signs [Data Includes: Last 1 Day]  17Jun2014 03:45PM  Blood Pressure: 123 /  65 Temperature: 99.2 F Heart Rate: 80  Physical Exam Constitutional: Well nourished and well developed . In acute distress. The patient appears dehydrated.  ENT:. The ears and nose are normal in appearance.  Neck: The appearance of the neck is normal.   Pulmonary: No respiratory distress.  Cardiovascular: Heart rate and rhythm are normal . The arterial pulses are normal.  Abdomen: The abdomen is soft and nontender. Left CVA tenderness.  Lymphatics: The posterior cervical nodes are not enlarged or tender.  Skin: Normal skin turgor.  Neuro/Psych:. Mood and affect are appropriate. No focal sensory deficits.    Assessment Assessed  1. Flank Pain Left 2. Nephrolithiasis 592.0   KUB + for left ureteral stone, POD # 5 s/p ESWL of 1 cm left UPJ non-obstructing stone Patient arrived to clinic very uncomfortable, holding her left side in pain, pain grade 8 out of 10, constant, sharp, non-radiating. She also has low grade fever 99.3 but pulse and BP stable.  She is more comfortable after Phenergan and Morphine injections for nausea and pain, respectively.   Plan Flank Pain Left, Nephrolithiasis (592.0)  1. KUB  Done: 17Jun2014 12:00AM Nephrolithiasis Of Both Kidneys (592.0)  2. Morphine Sulfate 15 MG/ML Injection Solution; INJECT 10  MG Intramuscular; Done: 17Jun2014  04:20PM; Status: COMPLETE - Retrospective Authorization 3. Promethazine HCl 25 MG/ML Injection Solution; INJECT 25 MG Intramuscular; Done: 17Jun2014  04:21PM; Status: COMPLETE - Retrospective Authorization   - Consulted w/ on call physician, Dr. Isabel Caprice.  Will admit for 23 hr observation for IV fluid, abx, and pain med - Order will be placed in Epic for direct admit   Signatures Electronically signed by : Seward Grater, ANP-C; Jul 31 2012  5:09PM

## 2012-08-01 ENCOUNTER — Observation Stay (HOSPITAL_COMMUNITY): Payer: Medicare Other

## 2012-08-01 ENCOUNTER — Encounter (HOSPITAL_COMMUNITY): Payer: Self-pay | Admitting: Anesthesiology

## 2012-08-01 ENCOUNTER — Encounter (HOSPITAL_COMMUNITY): Payer: Self-pay | Admitting: Registered Nurse

## 2012-08-01 ENCOUNTER — Encounter (HOSPITAL_COMMUNITY)
Admission: AD | Disposition: A | Discharge status: Home / Self Care | Payer: Self-pay | Source: Ambulatory Visit | Attending: Urology

## 2012-08-01 ENCOUNTER — Observation Stay (HOSPITAL_COMMUNITY): Payer: Medicare Other | Admitting: Anesthesiology

## 2012-08-01 ENCOUNTER — Other Ambulatory Visit: Payer: Self-pay | Admitting: Urology

## 2012-08-01 ENCOUNTER — Ambulatory Visit: Admit: 2012-08-01 | Payer: Self-pay | Admitting: Urology

## 2012-08-01 HISTORY — PX: CYSTOSCOPY W/ URETERAL STENT PLACEMENT: SHX1429

## 2012-08-01 LAB — BASIC METABOLIC PANEL
BUN: 25 mg/dL — ABNORMAL HIGH (ref 6–23)
CO2: 28 mEq/L (ref 19–32)
Calcium: 10.4 mg/dL (ref 8.4–10.5)
Glucose, Bld: 146 mg/dL — ABNORMAL HIGH (ref 70–99)
Potassium: 5.3 mEq/L — ABNORMAL HIGH (ref 3.5–5.1)
Sodium: 138 mEq/L (ref 135–145)

## 2012-08-01 LAB — CBC
Hemoglobin: 10.8 g/dL — ABNORMAL LOW (ref 12.0–15.0)
MCH: 29.3 pg (ref 26.0–34.0)
MCHC: 31.3 g/dL (ref 30.0–36.0)
MCV: 93.8 fL (ref 78.0–100.0)
RBC: 3.68 MIL/uL — ABNORMAL LOW (ref 3.87–5.11)

## 2012-08-01 SURGERY — CYSTOSCOPY, WITH RETROGRADE PYELOGRAM AND URETERAL STENT INSERTION
Anesthesia: General | Site: Ureter | Laterality: Left | Wound class: Clean Contaminated

## 2012-08-01 MED ORDER — ONDANSETRON HCL 4 MG/2ML IJ SOLN
INTRAMUSCULAR | Status: DC | PRN
  Administered 2012-08-01: 4 mg via INTRAVENOUS

## 2012-08-01 MED ORDER — FENTANYL CITRATE 0.05 MG/ML IJ SOLN: 25.0000 ug | INTRAMUSCULAR | Status: DC | PRN

## 2012-08-01 MED ORDER — SODIUM CHLORIDE 0.9 % IR SOLN
Status: DC | PRN
  Administered 2012-08-01: 3000 mL

## 2012-08-01 MED ORDER — EPHEDRINE SULFATE 50 MG/ML IJ SOLN
INTRAMUSCULAR | Status: DC | PRN
  Administered 2012-08-01 (×3): 5 mg via INTRAVENOUS

## 2012-08-01 MED ORDER — PHENYLEPHRINE HCL 10 MG/ML IJ SOLN
INTRAMUSCULAR | Status: DC | PRN
  Administered 2012-08-01 (×3): 80 ug via INTRAVENOUS

## 2012-08-01 MED ORDER — LACTATED RINGERS IV SOLN
INTRAVENOUS | Status: DC | PRN
  Administered 2012-08-01: 17:00:00 via INTRAVENOUS

## 2012-08-01 MED ORDER — PROPOFOL 10 MG/ML IV BOLUS
INTRAVENOUS | Status: DC | PRN
  Administered 2012-08-01: 60 mg via INTRAVENOUS

## 2012-08-01 MED ORDER — FENTANYL CITRATE 0.05 MG/ML IJ SOLN
INTRAMUSCULAR | Status: DC | PRN
  Administered 2012-08-01: 25 ug via INTRAVENOUS

## 2012-08-01 MED ORDER — LIDOCAINE HCL (CARDIAC) 20 MG/ML IV SOLN
INTRAVENOUS | Status: DC | PRN
  Administered 2012-08-01: 50 mg via INTRAVENOUS

## 2012-08-01 MED ORDER — IOHEXOL 300 MG/ML  SOLN
INTRAMUSCULAR | Status: DC | PRN
  Administered 2012-08-01: 7 mL via URETHRAL

## 2012-08-01 SURGICAL SUPPLY — 17 items
ADAPTER CATH URET PLST 4-6FR (CATHETERS) ×2 IMPLANT
ADPR CATH URET STRL DISP 4-6FR (CATHETERS) ×1
BAG URO CATCHER STRL LF (DRAPE) ×2 IMPLANT
CATH INTERMIT  6FR 70CM (CATHETERS) ×2 IMPLANT
CLOTH BEACON ORANGE TIMEOUT ST (SAFETY) ×2 IMPLANT
DRAPE CAMERA CLOSED 9X96 (DRAPES) ×2 IMPLANT
GLOVE BIOGEL M STRL SZ7.5 (GLOVE) ×2 IMPLANT
GLOVE SURG SS PI 8.5 STRL IVOR (GLOVE) ×1
GLOVE SURG SS PI 8.5 STRL STRW (GLOVE) IMPLANT
GOWN STRL NON-REIN LRG LVL3 (GOWN DISPOSABLE) ×4 IMPLANT
GOWN STRL REIN 2XL XLG LVL4 (GOWN DISPOSABLE) ×1 IMPLANT
GUIDEWIRE STR DUAL SENSOR (WIRE) ×2 IMPLANT
IV NS IRRIG 3000ML ARTHROMATIC (IV SOLUTION) ×1 IMPLANT
MANIFOLD NEPTUNE II (INSTRUMENTS) ×2 IMPLANT
PACK CYSTO (CUSTOM PROCEDURE TRAY) ×2 IMPLANT
STENT CONTOUR 6FRX24X.038 (STENTS) ×1 IMPLANT
TUBING CONNECTING 10 (TUBING) ×1 IMPLANT

## 2012-08-01 NOTE — Op Note (Signed)
Preoperative diagnosis:  1. Left ureteral calculus    Postoperative diagnosis:  1. Left ureteral calculus   Procedure:  1. Cystoscopy 2. Left ureteral stent placement ( 6 x 24)  3. Left retrograde pyelography with interpretation   Surgeon: Moody Bruins. M.D.  Anesthesia: General  Complications: None  Intraoperative findings: Left retrograde pyelography demonstrated a normal caliber distal ureter.  There was a filling defect in the proximal left ureter consistent with a sizeable stone fragment.  EBL: Minimal  Specimens: None  Indication: [name] is a 79 y.o. patient with a left sided ureteral stone s/p ESWL by Dr. Annabell Howells. After reviewing the management options for treatment, she elected to proceed with the above surgical procedure(s). We have discussed the potential benefits and risks of the procedure, side effects of the proposed treatment, the likelihood of the patient achieving the goals of the procedure, and any potential problems that might occur during the procedure or recuperation. Informed consent has been obtained.  Description of procedure:  The patient was taken to the operating room and general anesthesia was induced.  The patient was placed in the dorsal lithotomy position, prepped and draped in the usual sterile fashion, and preoperative antibiotics were administered. A preoperative time-out was performed.   Cystourethroscopy was performed.  The patient's urethra was examined and was normal. The bladder was then systematically examined in its entirety. There was no evidence for any bladder tumors, stones, or other mucosal pathology.    Attention then turned to the left ureteral orifice and a ureteral catheter was used to intubate the ureteral orifice.  Omnipaque contrast was injected through the ureteral catheter and a retrograde pyelogram was performed with findings as dictated above.  A 0.38 sensor guidewire was then advanced up the left ureter into the  renal pelvis under fluoroscopic guidance.  The wire was then backloaded through the cystoscope and a ureteral stent was advance over the wire using Seldinger technique.  The stent was positioned appropriately under fluoroscopic and cystoscopic guidance.  The wire was then removed with an adequate stent curl noted in the renal pelvis as well as in the bladder.  The bladder was then emptied and the procedure ended.  The patient appeared to tolerate the procedure well and without complications.  The patient was able to be awakened and transferred to the recovery unit in satisfactory condition.    Moody Bruins MD

## 2012-08-01 NOTE — Progress Notes (Signed)
Patient ID: [name], female   DOB: Jul 12, 1929, 79 y.o.   MRN: [id number]    Subjective: [name] was admitted with left flank pain and low grade fever post ESWL on Monday.   A KUB in the office suggested a large proximal fragment but on my review I thought that it might be a bowel content artifact since there were signficant lower pole fragments on the film as well and the large "fragment" is much denser than the original stone.    I got a KUB and renal US today and the KUB shows more hazy material in the area of the renal pelvis with no evidence of the previously noted large fragment confirming my suspicion.   She does have mild hydro on the left on [place] today and remains in pain  Requiring narcotics.   Her K was up this morning so the KCl was removed from the IV and her BP meds were held as well.  ROS: Negative except mild nausea and mild dysuria along with the pain.   Objective: Vital signs in last 24 hours: Temp:  [98.1 F (36.7 C)-98.9 F (37.2 C)] 98.9 F (37.2 C) (06/18 0434) Pulse Rate:  [67-114] 67 (06/18 0434) Resp:  [18-20] 20 (06/18 0434) BP: (110-123)/(43-68) 114/68 mmHg (06/18 0434) SpO2:  [88 %-100 %] 88 % (06/18 0434) Weight:  [78.6 kg (173 lb 4.5 oz)] 78.6 kg (173 lb 4.5 oz) (06/17 1800)  Intake/Output from previous day: 06/17 0701 - 06/18 0700 In: 1526.7 [P.O.:240; I.V.:1286.7] Out: 300 [Urine:300] Intake/Output this shift: Total I/O In: 0  Out: 400 [Urine:400]  General appearance: alert and no distress GI: Soft with left flank tenderness.   Lab Results:   Recent Labs  07/31/12 1828 08/01/12 0517  WBC 8.0 7.5  HGB 12.0 10.8*  HCT 36.0 34.5*  PLT PLATELETS APPEAR ADEQUATE 88*   BMET  Recent Labs  07/31/12 1828 08/01/12 0517  NA  --  138  K  --  5.3*  CL  --  103  CO2  --  28  GLUCOSE  --  146*  BUN  --  25*  CREATININE 1.64* 2.37*  CALCIUM  --  10.4   PT/INR No results found for this basename: LABPROT, INR,  in the last 72 hours ABG No  results found for this basename: PHART, PCO2, PO2, HCO3,  in the last 72 hours  Studies/Results: Dg Abd 1 View  08/01/2012   *RADIOLOGY REPORT*  Clinical Data: Bilateral renal calculi.  ABDOMEN - 1 VIEW  Comparison: 07/26/2012  Findings: There are multiple small bilateral renal calculi.  There is a 10.5 mm stone which is in the region of the left renal pelvis or proximal ureter.  This appears unchanged.  Air throughout nondistended loops of large or small bowel. Tortuous calcified abdominal aorta.  Moderate lumbar scoliosis.  IMPRESSION: No change in the bilateral renal calculi. 10.5 mm stone on the left may be in the renal pelvis or ureter.   Original Report Authenticated By: Francene Boyers, M.D.   US Renal  08/01/2012   *RADIOLOGY REPORT*  Clinical Data: Acute onset of left flank pain with nausea.  Status post lithotripsy on July 26, 2012.  RENAL/URINARY TRACT ULTRASOUND COMPLETE  Comparison:  Ultrasound dated 11/23/2010  Findings:  Right Kidney: 8.6 cm in length.  Small stones seen in the right kidney.  No hydronephrosis.  1.1 cm cyst on the upper pole.  Left Kidney:  There is mild left hydronephrosis which is new since  the prior ultrasound.  The renal pelvis does not appear distended. There is no appreciable subcapsular or perinephric hematoma.  Bladder:  Normal.  IMPRESSION:  1.  Mild left hydronephrosis.  No evidence of perinephric or subcapsular hematoma. 2.  Small stones in the right kidney.   Original Report Authenticated By: Francene Boyers, M.D.    Anti-infectives: Anti-infectives   Start     Dose/Rate Route Frequency Ordered Stop   08/01/12 1800  cefTRIAXone (ROCEPHIN) 1 g in dextrose 5 % 50 mL IVPB     1 g 100 mL/hr over 30 Minutes Intravenous Every 24 hours 07/31/12 1756     07/31/12 1800  cefTRIAXone (ROCEPHIN) 1 g in dextrose 5 % 50 mL IVPB     1 g 100 mL/hr over 30 Minutes Intravenous NOW 07/31/12 1740 07/31/12 1846      Current Facility-Administered Medications  Medication Dose  Route Frequency Provider Last Rate Last Dose  . 0.9 %  sodium chloride infusion   Intravenous Continuous Vernell Barrier, NP 100 mL/hr at 08/01/12 0433    . acetaminophen (TYLENOL) tablet 650 mg  650 mg Oral Q4H PRN Vernell Barrier, NP      . ALPRAZolam Prudy Feeler) tablet 0.5 mg  0.5 mg Oral QHS PRN Vernell Barrier, NP   0.5 mg at 08/01/12 0012  . cefTRIAXone (ROCEPHIN) 1 g in dextrose 5 % 50 mL IVPB  1 g Intravenous Q24H Anner Crete, MD      . cholecalciferol (VITAMIN D) tablet 2,000 Units  2,000 Units Oral BID Vernell Barrier, NP   2,000 Units at 08/01/12 1048  . furosemide (LASIX) tablet 40 mg  40 mg Oral Daily Vernell Barrier, NP   40 mg at 08/01/12 1047  . heparin injection 5,000 Units  5,000 Units Subcutaneous Q8H Vernell Barrier, NP   5,000 Units at 08/01/12 0529  . HYDROcodone-acetaminophen (NORCO/VICODIN) 5-325 MG per tablet 1-2 tablet  1-2 tablet Oral Q4H PRN Vernell Barrier, NP   2 tablet at 08/01/12 0826  . hyoscyamine (LEVSIN SL) SL tablet 0.125 mg  0.125 mg Oral Q4H PRN Vernell Barrier, NP      . metoprolol tartrate (LOPRESSOR) tablet 12.5 mg  12.5 mg Oral BID Vernell Barrier, NP   12.5 mg at 08/01/12 1047  . morphine 2 MG/ML injection 2-4 mg  2-4 mg Intravenous Q2H PRN Vernell Barrier, NP   2 mg at 08/01/12 1048  . multivitamin with minerals tablet 1 tablet  1 tablet Oral Daily Vernell Barrier, NP   1 tablet at 08/01/12 1047  . ondansetron (ZOFRAN) injection 4 mg  4 mg Intravenous Q4H PRN Vernell Barrier, NP      . senna-docusate (Senokot-S) tablet 1 tablet  1 tablet Oral QHS PRN Vernell Barrier, NP      . simvastatin (ZOCOR) tablet 20 mg  20 mg Oral QHS Vernell Barrier, NP   20 mg at 07/31/12 2105  . tamsulosin (FLOMAX) capsule 0.4 mg  0.4 mg Oral q morning - 10a Vernell Barrier, NP        Assessment: s/p Procedure(s): CYSTOSCOPY WITH RETROGRADE PYELOGRAM/URETERAL STENT PLACEMENT  She has persistant pain and probable obstructing fragments in the renal pelvis or proximal  ureter. Plan: She has been set up for cystoscopy with stent insertion later today.  This will be done by Dr. Laverle Patter.  Risks reviewed including bleeding, infection, ureteral injury, anesthetic and thromboembolic risks.   She will  need the stent for about a week and might need repeat ESWL or ureteroscopy if there is a large fragment which I am not sure there is based on my review of the films.      LOS: 1 day    Birgit Nowling J 08/01/2012

## 2012-08-01 NOTE — Preoperative (Signed)
Beta Blockers   Reason not to administer Beta Blockers:Metoprolol taken 1047 08-01-12

## 2012-08-01 NOTE — Anesthesia Postprocedure Evaluation (Signed)
  Anesthesia Post-op Note  Patient: [name]  Procedure(s) Performed: Procedure(s) (LRB): CYSTOSCOPY WITH RETROGRADE PYELOGRAM/URETERAL STENT PLACEMENT (Left)  Patient Location: PACU  Anesthesia Type: General  Level of Consciousness: awake and alert   Airway and Oxygen Therapy: Patient Spontanous Breathing  Post-op Pain: mild  Post-op Assessment: Post-op Vital signs reviewed, Patient's Cardiovascular Status Stable, Respiratory Function Stable, Patent Airway and No signs of Nausea or vomiting  Last Vitals:  Filed Vitals:   08/01/12 1845  BP: 106/60  Pulse: 92  Temp: 37.4 C  Resp: 14    Post-op Vital Signs: stable   Complications: No apparent anesthesia complications

## 2012-08-01 NOTE — Care Management Note (Addendum)
    Page 1 of 1   08/02/2012     1:36:20 PM   CARE MANAGEMENT NOTE 08/02/2012  Patient:  [name], [name]   Account Number:  [id number]  Date Initiated:  08/01/2012  Documentation initiated by:  Lanier Clam  Subjective/Objective Assessment:   ADMITTED W/N/V/UNABLE TO TOLERATE FOOD.DX:L KIDNEY STONE,S/P ESW LPOD#6.     Action/Plan:   FROM HOME W/SPOUSE.HAS PCP,PHARMACY.   Anticipated DC Date:  08/02/2012   Anticipated DC Plan:  HOME/SELF CARE      DC Planning Services  CM consult      Choice offered to / List presented to:             Status of service:  Completed, signed off Medicare Important Message given?   (If response is "NO", the following Medicare IM given date fields will be blank) Date Medicare IM given:   Date Additional Medicare IM given:    Discharge Disposition:  HOME/SELF CARE  Per UR Regulation:  Reviewed for med. necessity/level of care/duration of stay  If discussed at Long Length of Stay Meetings, dates discussed:    Comments:  08/02/12 Braxson Hollingsworth RN,BSN NCM [phone] D/C HOME NO NEEDS OR ORDERS.  08/01/12 Elley Harp RN,BSN NCM [phone] WOULD RECOMMEND PT EVAL.

## 2012-08-01 NOTE — Transfer of Care (Signed)
Immediate Anesthesia Transfer of Care Note  Patient: [name]  Procedure(s) Performed: Procedure(s): CYSTOSCOPY WITH RETROGRADE PYELOGRAM/URETERAL STENT PLACEMENT (Left)  Patient Location: PACU  Anesthesia Type:General  Level of Consciousness: awake, alert , oriented and patient cooperative  Airway & Oxygen Therapy: Patient Spontanous Breathing and Patient connected to face mask oxygen  Post-op Assessment: Report given to PACU RN, Post -op Vital signs reviewed and stable and Patient moving all extremities  Post vital signs: Reviewed and stable  Complications: No apparent anesthesia complications

## 2012-08-01 NOTE — Anesthesia Preprocedure Evaluation (Addendum)
Anesthesia Evaluation  Patient identified by MRN, date of birth, ID band Patient awake  General Assessment Comment:1. History of  Anxiety (Symptom) 300.00 2. History of  Atrial Fibrillation 427.31 3. History of  Esophageal Reflux 530.81 4. History of  Gout 274.9 5. History of  Hypercholesterolemia 272.0 6. History of  Hyperparathyroidism 252.00 7. History of  Hypertension 401.9 8. History of  Lower Back Pain 724.2 9. History of  Osteoarthritis V13.4 10. History of  Osteoporosis 733.00 11. History of  Thrombocytopenia 287.5 12. History of  Thromboembolic Disease B14.78 13. History of  Varicose Veins 454.9 14. History of  Vitamin D Deficiency 268.9     Reviewed: Allergy & Precautions, H&P , NPO status , Patient's Chart, lab work & pertinent test results  History of Anesthesia Complications (+) MALIGNANT HYPERTHERMIA  Airway Mallampati: II TM Distance: >3 FB Neck ROM: Full    Dental no notable dental hx.    Pulmonary pneumonia -, resolved,  breath sounds clear to auscultation  Pulmonary exam normal       Cardiovascular Exercise Tolerance: Good hypertension, Pt. on medications and Pt. on home beta blockers + Peripheral Vascular Disease and +CHF negative cardio ROS  + dysrhythmias Atrial Fibrillation + pacemaker Rhythm:Irregular Rate:Normal + Systolic murmurs Sees Dr. Royann Shivers for cardiology. Clearance 07-19-12  No current chest pain nor SOB   Neuro/Psych negative neurological ROS  negative psych ROS   GI/Hepatic Neg liver ROS, hiatal hernia,   Endo/Other  negative endocrine ROS  Renal/GU Renal InsufficiencyRenal diseaseCr 2.37 K 5.3  negative genitourinary   Musculoskeletal negative musculoskeletal ROS (+)   Abdominal   Peds negative pediatric ROS (+)  Hematology Platelets 88K   Anesthesia Other Findings   Reproductive/Obstetrics negative OB ROS                      Anesthesia  Physical Anesthesia Plan  ASA: III  Anesthesia Plan: General   Post-op Pain Management:    Induction: Intravenous  Airway Management Planned: LMA  Additional Equipment:   Intra-op Plan:   Post-operative Plan: Extubation in OR  Informed Consent: I have reviewed the patients History and Physical, chart, labs and discussed the procedure including the risks, benefits and alternatives for the proposed anesthesia with the patient or authorized representative who has indicated his/her understanding and acceptance.   Dental advisory given  Plan Discussed with: CRNA  Anesthesia Plan Comments:        Anesthesia Quick Evaluation

## 2012-08-02 ENCOUNTER — Other Ambulatory Visit: Payer: Self-pay | Admitting: Urology

## 2012-08-02 ENCOUNTER — Encounter (HOSPITAL_COMMUNITY): Payer: Self-pay | Admitting: Urology

## 2012-08-02 DIAGNOSIS — N201 Calculus of ureter: Secondary | ICD-10-CM | POA: Diagnosis present

## 2012-08-02 DIAGNOSIS — N179 Acute kidney failure, unspecified: Secondary | ICD-10-CM | POA: Diagnosis present

## 2012-08-02 LAB — CBC
HCT: 35.6 % — ABNORMAL LOW (ref 36.0–46.0)
Hemoglobin: 11.6 g/dL — ABNORMAL LOW (ref 12.0–15.0)
WBC: 8.1 10*3/uL (ref 4.0–10.5)

## 2012-08-02 LAB — BASIC METABOLIC PANEL
BUN: 18 mg/dL (ref 6–23)
CO2: 27 mEq/L (ref 19–32)
Chloride: 104 mEq/L (ref 96–112)
GFR calc Af Amer: 40 mL/min — ABNORMAL LOW (ref >90)
Potassium: 4 mEq/L (ref 3.5–5.1)

## 2012-08-02 MED FILL — Lidocaine HCl Gel 2%: CUTANEOUS | Qty: 10 | Status: AC

## 2012-08-02 MED FILL — Indigotindisulfonate Sodium Inj 8 MG/ML: INTRAMUSCULAR | Qty: 5 | Status: AC

## 2012-08-02 NOTE — Discharge Summary (Addendum)
Physician Discharge Summary  Patient ID: [name] MRN: [id number] DOB/AGE: 02-21-1929 79 y.o.  Admit date: 07/31/2012 Discharge date: 08/02/2012  Admission Diagnoses:  Left ureteral stone with obstruction  Discharge Diagnoses: Same   Active Problems:  Left ureteral stone  Acute renal insufficiency Thrombocytopenia.  Hyperkalemia.   Discharged Condition: good  Hospital Course: [name] had ESWL on 6/16 for a LUPJ stone.  She presented on 6/17 with increased pain and low grade fever and was admitted for management.   She was given Rocephin and KUB demonstrated a 10mm proximal fragment with hydro on [place].   She had a left  Ureteral stent placed on 6/18.   She is feeling better today with no pain or significant fever.   CBC and BMP were obtained this morning but the results are not back.  She will be sent home once these results are reviewed.   She will need left ureteroscopy next week.   Consults: None  Significant Diagnostic Studies: KUB and Renal US  Treatments: surgery: Cysto stent placement.   Discharge Exam: Blood pressure 117/51, pulse 85, temperature 99.6 F (37.6 C), temperature source Oral, resp. rate 16, height 5\' 5"  (1.651 m), weight 78.6 kg (173 lb 4.5 oz), SpO2 98.00%. General appearance: alert and no distress  Disposition: 01-Home or Self Care  Discharge Orders   Future Appointments Provider Department Dept Phone   10/18/2012 2:30 PM Rachael Fee [facility] [place] [phone]   10/18/2012 3:00 PM Josph Macho, MD [facility] [place] [phone]   Future Orders Complete By Expires     Discontinue IV  As directed         Medication List    TAKE these medications       alendronate 10 MG tablet  Commonly known as:  FOSAMAX  Take 10 mg by mouth every 7 (seven) days. Take with a full glass of water on an empty stomach.     ALPRAZolam 0.5 MG tablet  Commonly known as:  XANAX  Take 0.5 mg by mouth at  bedtime.     aspirin EC 81 MG tablet  Take 81 mg by mouth daily.     AZOR 5-20 MG per tablet  Generic drug:  amLODipine-olmesartan  Take 1 tablet by mouth every morning.     B-complex with vitamin C tablet  Take 1 tablet by mouth daily.     cholecalciferol 1000 UNITS tablet  Commonly known as:  VITAMIN D  Take 2,000 Units by mouth 2 (two) times daily.     furosemide 40 MG tablet  Commonly known as:  LASIX  Take 40 mg by mouth daily.     HYDROcodone-acetaminophen 5-325 MG per tablet  Commonly known as:  NORCO  Take 1 tablet by mouth every 6 (six) hours as needed for pain.     metoprolol tartrate 25 MG tablet  Commonly known as:  LOPRESSOR  Take 12.5 mg by mouth 2 (two) times daily.     multivitamin tablet  Take 1 tablet by mouth daily.     omeprazole 20 MG capsule  Commonly known as:  PRILOSEC  Take 20 mg by mouth daily.     ondansetron 4 MG disintegrating tablet  Commonly known as:  ZOFRAN ODT  Take 1 tablet (4 mg total) by mouth every 8 (eight) hours as needed for nausea.     potassium chloride SA 20 MEQ tablet  Commonly known as:  K-DUR,KLOR-CON  Take 20 mEq by mouth daily.     simvastatin 20 MG tablet  Commonly known as:  ZOCOR  Take 20 mg by mouth at bedtime.     VITAMIN B COMPLEX PO  Take by mouth every morning.           Follow-up Information   Follow up with Anner Crete, MD. (My office will call to schedule the next procedure. )    Contact information:   [address] [place] [address] [phone]       Signed: Anner Crete 08/02/2012, 7:57 AM  Her am Labs are ok with normakalemia and an improved Cr and platelet count.

## 2012-08-08 ENCOUNTER — Encounter (HOSPITAL_COMMUNITY): Payer: Self-pay | Admitting: Pharmacy Technician

## 2012-08-10 ENCOUNTER — Encounter (HOSPITAL_COMMUNITY): Payer: Self-pay

## 2012-08-10 ENCOUNTER — Encounter (HOSPITAL_COMMUNITY)
Admission: RE | Admit: 2012-08-10 | Discharge: 2012-08-10 | Disposition: A | Discharge status: Home / Self Care | Payer: Medicare Other | Source: Ambulatory Visit | Attending: Urology | Admitting: Urology

## 2012-08-10 ENCOUNTER — Ambulatory Visit (HOSPITAL_COMMUNITY)
Admission: RE | Admit: 2012-08-10 | Discharge: 2012-08-10 | Disposition: A | Discharge status: Home / Self Care | Payer: Medicare Other | Source: Ambulatory Visit | Attending: Urology | Admitting: Urology

## 2012-08-10 DIAGNOSIS — I10 Essential (primary) hypertension: Secondary | ICD-10-CM | POA: Insufficient documentation

## 2012-08-10 DIAGNOSIS — Z01818 Encounter for other preprocedural examination: Secondary | ICD-10-CM | POA: Insufficient documentation

## 2012-08-10 DIAGNOSIS — Z950 Presence of cardiac pacemaker: Secondary | ICD-10-CM | POA: Insufficient documentation

## 2012-08-10 DIAGNOSIS — N201 Calculus of ureter: Secondary | ICD-10-CM | POA: Insufficient documentation

## 2012-08-10 HISTORY — DX: Anxiety disorder, unspecified: F41.9

## 2012-08-10 HISTORY — DX: Depression, unspecified: F32.A

## 2012-08-10 HISTORY — DX: Unspecified osteoarthritis, unspecified site: M19.90

## 2012-08-10 HISTORY — DX: Major depressive disorder, single episode, unspecified: F32.9

## 2012-08-10 LAB — CBC
Hemoglobin: 12.2 g/dL (ref 12.0–15.0)
MCH: 30.2 pg (ref 26.0–34.0)
MCHC: 32.4 g/dL (ref 30.0–36.0)
RDW: 14.2 % (ref 11.5–15.5)

## 2012-08-10 LAB — BASIC METABOLIC PANEL
Calcium: 10.8 mg/dL — ABNORMAL HIGH (ref 8.4–10.5)
GFR calc Af Amer: 50 mL/min — ABNORMAL LOW (ref >90)
GFR calc non Af Amer: 43 mL/min — ABNORMAL LOW (ref >90)
Glucose, Bld: 96 mg/dL (ref 70–99)
Potassium: 3.9 mEq/L (ref 3.5–5.1)
Sodium: 138 mEq/L (ref 135–145)

## 2012-08-10 LAB — SURGICAL PCR SCREEN
MRSA, PCR: NEGATIVE
Staphylococcus aureus: NEGATIVE

## 2012-08-10 NOTE — Patient Instructions (Signed)
20 Mikaelah Trostle  08/10/2012   Your procedure is scheduled on: 08/14/12  Report to [facility] at 6:45 AM.  Call this number if you have problems the morning of surgery [phone]: [phone]   Remember:   Do not eat food or drink liquids After Midnight.     Take these medicines the morning of surgery with A SIP OF WATER: hydrocodone if needed, metoprolol, omeprazole   Do not wear jewelry, make-up or nail polish.  Do not wear lotions, powders, or perfumes. You may wear deodorant.  Do not shave 48 hours prior to surgery. Men may shave face and neck.  Do not bring valuables to the hospital.  Contacts, dentures or bridgework may not be worn into surgery.     Patients discharged the day of surgery will not be allowed to drive home.  Name and phone number of your driver: Hendrix Console [phone]    Please read over the following fact sheets that you were given: MRSA Information.  Birdie Sons, RN  pre op nurse call if needed [phone]    FAILURE TO FOLLOW THESE INSTRUCTIONS MAY RESULT IN CANCELLATION OF YOUR SURGERY   Patient Signature: ___________________________________________

## 2012-08-10 NOTE — Progress Notes (Signed)
LOV note Dr. Annabell Howells 08/02/12 on chart, pacemaker status note on chart, perioperative prescription for implanted cardiac device programming sheet on chart, office note 02/16/12 Dr. Royann Shivers on chart, EKG Jan. 2014 on chart, ECHO 08/24/11 on chart, Pacemaker interrogation note 06/12/12 on chart

## 2012-08-13 ENCOUNTER — Other Ambulatory Visit: Payer: Self-pay | Admitting: Cardiovascular Disease

## 2012-08-13 DIAGNOSIS — I059 Rheumatic mitral valve disease, unspecified: Secondary | ICD-10-CM

## 2012-08-13 DIAGNOSIS — I10 Essential (primary) hypertension: Secondary | ICD-10-CM

## 2012-08-14 ENCOUNTER — Ambulatory Visit (HOSPITAL_COMMUNITY): Payer: Medicare Other | Admitting: Anesthesiology

## 2012-08-14 ENCOUNTER — Encounter (HOSPITAL_COMMUNITY): Payer: Self-pay | Admitting: Anesthesiology

## 2012-08-14 ENCOUNTER — Observation Stay (HOSPITAL_COMMUNITY)
Admission: RE | Admit: 2012-08-14 | Discharge: 2012-08-15 | Disposition: A | Discharge status: Home / Self Care | Payer: Medicare Other | Source: Ambulatory Visit | Attending: Urology | Admitting: Urology

## 2012-08-14 ENCOUNTER — Encounter (HOSPITAL_COMMUNITY)
Admission: RE | Disposition: A | Discharge status: Home / Self Care | Payer: Self-pay | Source: Ambulatory Visit | Attending: Urology

## 2012-08-14 ENCOUNTER — Encounter (HOSPITAL_COMMUNITY): Payer: Self-pay | Admitting: *Deleted

## 2012-08-14 DIAGNOSIS — N201 Calculus of ureter: Principal | ICD-10-CM | POA: Insufficient documentation

## 2012-08-14 DIAGNOSIS — E213 Hyperparathyroidism, unspecified: Secondary | ICD-10-CM | POA: Insufficient documentation

## 2012-08-14 DIAGNOSIS — I10 Essential (primary) hypertension: Secondary | ICD-10-CM | POA: Insufficient documentation

## 2012-08-14 DIAGNOSIS — N200 Calculus of kidney: Secondary | ICD-10-CM | POA: Insufficient documentation

## 2012-08-14 DIAGNOSIS — I739 Peripheral vascular disease, unspecified: Secondary | ICD-10-CM | POA: Insufficient documentation

## 2012-08-14 DIAGNOSIS — Z950 Presence of cardiac pacemaker: Secondary | ICD-10-CM | POA: Insufficient documentation

## 2012-08-14 DIAGNOSIS — K649 Unspecified hemorrhoids: Secondary | ICD-10-CM | POA: Insufficient documentation

## 2012-08-14 DIAGNOSIS — E7800 Pure hypercholesterolemia, unspecified: Secondary | ICD-10-CM | POA: Insufficient documentation

## 2012-08-14 DIAGNOSIS — I4891 Unspecified atrial fibrillation: Secondary | ICD-10-CM | POA: Insufficient documentation

## 2012-08-14 DIAGNOSIS — R310 Gross hematuria: Secondary | ICD-10-CM | POA: Insufficient documentation

## 2012-08-14 DIAGNOSIS — Z79899 Other long term (current) drug therapy: Secondary | ICD-10-CM | POA: Insufficient documentation

## 2012-08-14 DIAGNOSIS — M810 Age-related osteoporosis without current pathological fracture: Secondary | ICD-10-CM | POA: Insufficient documentation

## 2012-08-14 DIAGNOSIS — Z7982 Long term (current) use of aspirin: Secondary | ICD-10-CM | POA: Insufficient documentation

## 2012-08-14 DIAGNOSIS — I509 Heart failure, unspecified: Secondary | ICD-10-CM | POA: Insufficient documentation

## 2012-08-14 HISTORY — PX: CYSTOSCOPY WITH URETEROSCOPY: SHX5123

## 2012-08-14 HISTORY — PX: HOLMIUM LASER APPLICATION: SHX5852

## 2012-08-14 SURGERY — CYSTOSCOPY WITH URETEROSCOPY
Anesthesia: General | Laterality: Left | Wound class: Clean Contaminated

## 2012-08-14 MED ORDER — ALPRAZOLAM 0.5 MG PO TABS
0.5000 mg | ORAL_TABLET | Freq: Every day | ORAL | Status: DC
  Administered 2012-08-14: 0.5 mg via ORAL
  Filled 2012-08-14: qty 1

## 2012-08-14 MED ORDER — IOHEXOL 300 MG/ML  SOLN
INTRAMUSCULAR | Status: AC
  Filled 2012-08-14: qty 1

## 2012-08-14 MED ORDER — ONDANSETRON HCL 4 MG/2ML IJ SOLN: 4.0000 mg | Freq: Four times a day (QID) | INTRAMUSCULAR | Status: DC | PRN

## 2012-08-14 MED ORDER — AMLODIPINE-OLMESARTAN 5-20 MG PO TABS: 1.0000 | ORAL_TABLET | Freq: Every morning | ORAL | Status: DC

## 2012-08-14 MED ORDER — LACTATED RINGERS IV SOLN
INTRAVENOUS | Status: DC | PRN
  Administered 2012-08-14: 09:00:00 via INTRAVENOUS

## 2012-08-14 MED ORDER — ZOLPIDEM TARTRATE 5 MG PO TABS: 5.0000 mg | ORAL_TABLET | Freq: Every evening | ORAL | Status: DC | PRN

## 2012-08-14 MED ORDER — METOPROLOL TARTRATE 12.5 MG HALF TABLET
12.5000 mg | ORAL_TABLET | Freq: Two times a day (BID) | ORAL | Status: DC
  Administered 2012-08-14 – 2012-08-15 (×2): 12.5 mg via ORAL
  Filled 2012-08-14 (×4): qty 1

## 2012-08-14 MED ORDER — FUROSEMIDE 40 MG PO TABS
40.0000 mg | ORAL_TABLET | Freq: Every morning | ORAL | Status: DC
  Administered 2012-08-14 – 2012-08-15 (×2): 40 mg via ORAL
  Filled 2012-08-14 (×2): qty 1

## 2012-08-14 MED ORDER — IRBESARTAN 150 MG PO TABS
150.0000 mg | ORAL_TABLET | Freq: Every day | ORAL | Status: DC
  Administered 2012-08-15: 150 mg via ORAL
  Filled 2012-08-14: qty 1

## 2012-08-14 MED ORDER — SODIUM CHLORIDE 0.9 % IV SOLN: 250.0000 mL | INTRAVENOUS | Status: DC | PRN

## 2012-08-14 MED ORDER — PHENYLEPHRINE HCL 10 MG/ML IJ SOLN
INTRAMUSCULAR | Status: DC | PRN
  Administered 2012-08-14: 40 ug via INTRAVENOUS

## 2012-08-14 MED ORDER — CIPROFLOXACIN IN D5W 400 MG/200ML IV SOLN
INTRAVENOUS | Status: AC
  Filled 2012-08-14: qty 200

## 2012-08-14 MED ORDER — POTASSIUM CHLORIDE CRYS ER 20 MEQ PO TBCR
20.0000 meq | EXTENDED_RELEASE_TABLET | Freq: Every day | ORAL | Status: DC
  Administered 2012-08-14: 20 meq via ORAL
  Filled 2012-08-14 (×2): qty 1

## 2012-08-14 MED ORDER — ACETAMINOPHEN 650 MG RE SUPP
650.0000 mg | RECTAL | Status: DC | PRN
  Filled 2012-08-14: qty 1

## 2012-08-14 MED ORDER — PROPOFOL 10 MG/ML IV BOLUS
INTRAVENOUS | Status: DC | PRN
  Administered 2012-08-14: 140 mg via INTRAVENOUS

## 2012-08-14 MED ORDER — PANTOPRAZOLE SODIUM 40 MG PO TBEC
40.0000 mg | DELAYED_RELEASE_TABLET | Freq: Every day | ORAL | Status: DC
  Administered 2012-08-14 – 2012-08-15 (×2): 40 mg via ORAL
  Filled 2012-08-14 (×2): qty 1

## 2012-08-14 MED ORDER — PROMETHAZINE HCL 25 MG/ML IJ SOLN: 6.2500 mg | INTRAMUSCULAR | Status: DC | PRN

## 2012-08-14 MED ORDER — HYDROCODONE-ACETAMINOPHEN 5-325 MG PO TABS: 1.0000 | ORAL_TABLET | Freq: Four times a day (QID) | ORAL | Status: DC | PRN

## 2012-08-14 MED ORDER — ONDANSETRON HCL 4 MG/2ML IJ SOLN: 4.0000 mg | INTRAMUSCULAR | Status: DC | PRN

## 2012-08-14 MED ORDER — FENTANYL CITRATE 0.05 MG/ML IJ SOLN: 25.0000 ug | INTRAMUSCULAR | Status: DC | PRN

## 2012-08-14 MED ORDER — SODIUM CHLORIDE 0.9 % IJ SOLN: 3.0000 mL | INTRAMUSCULAR | Status: DC | PRN

## 2012-08-14 MED ORDER — CIPROFLOXACIN IN D5W 400 MG/200ML IV SOLN
400.0000 mg | INTRAVENOUS | Status: AC
  Administered 2012-08-14: 400 mg via INTRAVENOUS

## 2012-08-14 MED ORDER — SIMVASTATIN 20 MG PO TABS
20.0000 mg | ORAL_TABLET | Freq: Every day | ORAL | Status: DC
Start: 2012-08-14 — End: 2012-08-15
  Administered 2012-08-14: 20 mg via ORAL
  Filled 2012-08-14 (×2): qty 1

## 2012-08-14 MED ORDER — AMLODIPINE BESYLATE 5 MG PO TABS
5.0000 mg | ORAL_TABLET | Freq: Every day | ORAL | Status: DC
  Administered 2012-08-15: 5 mg via ORAL
  Filled 2012-08-14: qty 1

## 2012-08-14 MED ORDER — HYDROMORPHONE HCL PF 1 MG/ML IJ SOLN
0.5000 mg | INTRAMUSCULAR | Status: DC | PRN
  Administered 2012-08-14: 1 mg via INTRAVENOUS
  Filled 2012-08-14: qty 1

## 2012-08-14 MED ORDER — ONDANSETRON 4 MG PO TBDP
4.0000 mg | ORAL_TABLET | Freq: Three times a day (TID) | ORAL | Status: DC | PRN
  Filled 2012-08-14: qty 1

## 2012-08-14 MED ORDER — IOHEXOL 300 MG/ML  SOLN
INTRAMUSCULAR | Status: DC | PRN
  Administered 2012-08-14: 8 mL

## 2012-08-14 MED ORDER — FENTANYL CITRATE 0.05 MG/ML IJ SOLN
25.0000 ug | INTRAMUSCULAR | Status: DC | PRN
  Administered 2012-08-14 (×4): 25 ug via INTRAVENOUS

## 2012-08-14 MED ORDER — FENTANYL CITRATE 0.05 MG/ML IJ SOLN
INTRAMUSCULAR | Status: DC | PRN
  Administered 2012-08-14: 50 ug via INTRAVENOUS

## 2012-08-14 MED ORDER — DOCUSATE SODIUM 100 MG PO CAPS
100.0000 mg | ORAL_CAPSULE | Freq: Two times a day (BID) | ORAL | Status: DC
  Administered 2012-08-14 – 2012-08-15 (×2): 100 mg via ORAL
  Filled 2012-08-14 (×4): qty 1

## 2012-08-14 MED ORDER — SODIUM CHLORIDE 0.9 % IR SOLN
Status: DC | PRN
  Administered 2012-08-14: 4000 mL via INTRAVESICAL

## 2012-08-14 MED ORDER — CIPROFLOXACIN HCL 250 MG PO TABS
250.0000 mg | ORAL_TABLET | Freq: Two times a day (BID) | ORAL | Status: DC
  Administered 2012-08-14 – 2012-08-15 (×2): 250 mg via ORAL
  Filled 2012-08-14 (×5): qty 1

## 2012-08-14 MED ORDER — AMLODIPINE BESYLATE 5 MG PO TABS
5.0000 mg | ORAL_TABLET | Freq: Once | ORAL | Status: DC
  Filled 2012-08-14: qty 1

## 2012-08-14 MED ORDER — LACTATED RINGERS IV SOLN
INTRAVENOUS | Status: DC
  Administered 2012-08-14: 12:00:00 via INTRAVENOUS

## 2012-08-14 MED ORDER — POTASSIUM CHLORIDE IN NACL 20-0.45 MEQ/L-% IV SOLN
INTRAVENOUS | Status: DC
  Administered 2012-08-14: 15:00:00 via INTRAVENOUS
  Filled 2012-08-14: qty 1000

## 2012-08-14 MED ORDER — SODIUM CHLORIDE 0.9 % IJ SOLN
3.0000 mL | Freq: Two times a day (BID) | INTRAMUSCULAR | Status: DC
  Administered 2012-08-14: 3 mL via INTRAVENOUS

## 2012-08-14 MED ORDER — FENTANYL CITRATE 0.05 MG/ML IJ SOLN
INTRAMUSCULAR | Status: AC
  Filled 2012-08-14: qty 2

## 2012-08-14 MED ORDER — ACETAMINOPHEN 325 MG PO TABS
650.0000 mg | ORAL_TABLET | ORAL | Status: DC | PRN
  Administered 2012-08-14: 650 mg via ORAL

## 2012-08-14 MED ORDER — OXYCODONE HCL 5 MG PO TABS
5.0000 mg | ORAL_TABLET | ORAL | Status: DC | PRN
  Administered 2012-08-14: 10 mg via ORAL
  Filled 2012-08-14: qty 2

## 2012-08-14 MED ORDER — ACETAMINOPHEN 325 MG PO TABS
650.0000 mg | ORAL_TABLET | ORAL | Status: DC | PRN
  Filled 2012-08-14: qty 2

## 2012-08-14 MED ORDER — HYOSCYAMINE SULFATE 0.125 MG SL SUBL
0.1250 mg | SUBLINGUAL_TABLET | SUBLINGUAL | Status: DC | PRN
  Filled 2012-08-14: qty 1

## 2012-08-14 SURGICAL SUPPLY — 21 items
ADAPTER CATH URET PLST 4-6FR (CATHETERS) ×1 IMPLANT
ADPR CATH URET STRL DISP 4-6FR (CATHETERS) ×1
BAG URO CATCHER STRL LF (DRAPE) ×2 IMPLANT
BASKET LASER NITINOL 1.9FR (BASKET) ×2 IMPLANT
BASKET ZERO TIP NITINOL 2.4FR (BASKET) ×1 IMPLANT
BSKT STON RTRVL 120 1.9FR (BASKET) ×1
BSKT STON RTRVL ZERO TP 2.4FR (BASKET) ×1
CATH URET 5FR 28IN OPEN ENDED (CATHETERS) ×2 IMPLANT
CLOTH BEACON ORANGE TIMEOUT ST (SAFETY) ×2 IMPLANT
DRAPE CAMERA CLOSED 9X96 (DRAPES) ×2 IMPLANT
FIBER LASER TRAC TIP (UROLOGICAL SUPPLIES) ×1 IMPLANT
GLOVE SURG SS PI 8.0 STRL IVOR (GLOVE) ×2 IMPLANT
GOWN PREVENTION PLUS XLARGE (GOWN DISPOSABLE) ×2 IMPLANT
GOWN STRL REIN XL XLG (GOWN DISPOSABLE) ×2 IMPLANT
GUIDEWIRE STR DUAL SENSOR (WIRE) ×2 IMPLANT
MANIFOLD NEPTUNE II (INSTRUMENTS) ×2 IMPLANT
MARKER SKIN DUAL TIP RULER LAB (MISCELLANEOUS) ×2 IMPLANT
PACK CYSTO (CUSTOM PROCEDURE TRAY) ×2 IMPLANT
SHEATH ACCESS URETERAL 38CM (SHEATH) ×1 IMPLANT
STENT CONTOUR 6FRX24X.038 (STENTS) ×1 IMPLANT
TUBING CONNECTING 10 (TUBING) ×2 IMPLANT

## 2012-08-14 NOTE — Anesthesia Preprocedure Evaluation (Signed)
Anesthesia Evaluation  Patient identified by MRN, date of birth, ID band Patient awake    Reviewed: Allergy & Precautions, H&P , NPO status , Patient's Chart, lab work & pertinent test results  Airway Mallampati: II TM Distance: >3 FB Neck ROM: Full    Dental no notable dental hx.    Pulmonary neg pulmonary ROS,  breath sounds clear to auscultation  Pulmonary exam normal       Cardiovascular hypertension, Pt. on medications + Peripheral Vascular Disease and +CHF + dysrhythmias Atrial Fibrillation + pacemaker + Valvular Problems/Murmurs MR Rhythm:Regular Rate:Normal  MITRAL VALVE REPLACEMENT 04/03/2009    Neuro/Psych negative neurological ROS  negative psych ROS   GI/Hepatic negative GI ROS, Neg liver ROS,   Endo/Other  negative endocrine ROS  Renal/GU Renal InsufficiencyRenal disease  negative genitourinary   Musculoskeletal negative musculoskeletal ROS (+)   Abdominal   Peds negative pediatric ROS (+)  Hematology negative hematology ROS (+)   Anesthesia Other Findings   Reproductive/Obstetrics negative OB ROS                           Anesthesia Physical Anesthesia Plan  ASA: III  Anesthesia Plan: General   Post-op Pain Management:    Induction: Intravenous  Airway Management Planned: LMA  Additional Equipment:   Intra-op Plan:   Post-operative Plan:   Informed Consent: I have reviewed the patients History and Physical, chart, labs and discussed the procedure including the risks, benefits and alternatives for the proposed anesthesia with the patient or authorized representative who has indicated his/her understanding and acceptance.   Dental advisory given  Plan Discussed with: CRNA and Surgeon  Anesthesia Plan Comments:         Anesthesia Quick Evaluation

## 2012-08-14 NOTE — H&P (View-Only) (Signed)
istory of Present Illness  [name] is a 79 yo WF sent in consultation by Dr. Tenny Craw for gross hematuria.  She had the onset of gross hematuria 8 days ago and she had bloody urine for about 8 days.  She had no dysuria and had no flank pain.  She has not had prior bleeding.  She denies stones and has only had one prior UTI remotely.  She denies GU surgery.  She has some mild vaginal burning.  Her culture was negative from last week and she took fluconazole for presumed yeast.  She does have hemorrhoids but she is sure the blood was in the urine.   She has a diagnosis of hyperparathyroidism but has not been treated surgically.   Past Medical History Problems  1. History of  Anxiety (Symptom) 300.00 2. History of  Atrial Fibrillation 427.31 3. History of  Esophageal Reflux 530.81 4. History of  Gout 274.9 5. History of  Hypercholesterolemia 272.0 6. History of  Hyperparathyroidism 252.00 7. History of  Hypertension 401.9 8. History of  Lower Back Pain 724.2 9. History of  Osteoarthritis V13.4 10. History of  Osteoporosis 733.00 11. History of  Thrombocytopenia 287.5 12. 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 History of  Varicose Veins 454.9 14. History of  Vitamin D Deficiency 268.9  Surgical History Problems  1. History of  Hand Surgery Right 2. History of  Hysterectomy V45.77 3. History of  Mitral Valve Repair 4. History of  Pacemaker Placement  Current Meds 1. Alendronate Sodium 70 MG Oral Tablet; Therapy: (Recorded:03Jun2014) to 2. ALPRAZolam 0.5 MG Oral Tablet; Therapy: (Recorded:03Jun2014) to 3. Aspirin 81 MG Oral Tablet; Therapy: (Recorded:03Jun2014) to 4. Azor 5-20 MG Oral Tablet; Therapy: (Recorded:03Jun2014) to 5. Bactroban OINT; Therapy: (Recorded:03Jun2014) to 6. Diprolene AF 0.05 % External Cream; Therapy: (Recorded:03Jun2014) to 7. Furosemide 40 MG Oral Tablet; Therapy: (Recorded:03Jun2014) to 8. Metoprolol Tartrate 25 MG Oral Tablet; Therapy:  (Recorded:03Jun2014) to 9. Multi-Vitamin TABS; Therapy: (Recorded:03Jun2014) to 10. Omeprazole 20 MG Oral Capsule Delayed Release; Therapy: (Recorded:03Jun2014) to 11. Potassium Chloride Crys ER 20 MEQ Oral Tablet Extended Release; Therapy:   (Recorded:03Jun2014) to 12. Simvastatin 20 MG Oral Tablet; Therapy: (Recorded:03Jun2014) to 13. Vitamin B Complex TABS; Therapy: (Recorded:03Jun2014) to 14. Vitamin C TABS; Therapy: (Recorded:03Jun2014) to 15. Vitamin D3 1000 UNIT Oral Capsule; Therapy: (Recorded:03Jun2014) to  Allergies Medication  1. Zantac CAPS  Family History Problems  1. Family history of  Death In The Family Father heart 2. Family history of  Death In The Family Mother melanoma 3. Paternal history of  Diabetes Mellitus V18.0 4. Family history of  Family Health Status Number Of Children 1 son  1 daughter  Social History Problems    Marital History - Currently Married   Never A Smoker   Occupation: Retired Denied    History of  Alcohol Use   History of  Caffeine Use   History of  Tobacco Use 305.1  Review of Systems Genitourinary, constitutional, skin, eye, otolaryngeal, hematologic/lymphatic, cardiovascular, pulmonary, endocrine, musculoskeletal, gastrointestinal, neurological and psychiatric system(s) were reviewed and pertinent findings if present are noted.  Genitourinary: urinary urgency, incontinence and hematuria.  Musculoskeletal: back pain.  Neurological: dizziness.  Psychiatric: anxiety.    Vitals Vital Signs [Data Includes: Last 1 Day]  03Jun2014 10:24AM  BMI Calculated: 28.32 BSA Calculated: 1.85 Height: 5 ft 5 in Weight: 170 lb  Blood Pressure: 110 / 71 Temperature: 98.9 F Heart Rate: 72  Physical Exam Constitutional: Well nourished and well developed . No  acute distress.  ENT:. The ears and nose are normal in appearance.  Neck: The appearance of the neck is normal and no neck mass is present.  Pulmonary: No respiratory distress and  normal respiratory rhythm and effort.  Cardiovascular: Heart rate and rhythm are normal . No peripheral edema.  Abdomen: The abdomen is soft and nontender. No masses are palpated. No CVA tenderness. No hernias are palpable. No hepatosplenomegaly noted.  Lymphatics: The supraclavicular, axillary, femoral and inguinal nodes are not enlarged or tender.  Skin: Normal skin turgor, no visible rash and no visible skin lesions.  Neuro/Psych:. Mood and affect are appropriate. No focal sensory deficits. No motor deficits.    Results/Data Urine [Data Includes: Last 1 Day]   03Jun2014  COLOR YELLOW   APPEARANCE CLOUDY   SPECIFIC GRAVITY 1.020   pH 6.0   GLUCOSE NEG mg/dL  BILIRUBIN SMALL   KETONE NEG mg/dL  BLOOD LARGE   PROTEIN 100 mg/dL  UROBILINOGEN 0.2 mg/dL  NITRITE NEG   LEUKOCYTE ESTERASE NEG   SQUAMOUS EPITHELIAL/HPF RARE   WBC NONE SEEN WBC/hpf  RBC TNTC RBC/hpf  BACTERIA RARE   CRYSTALS NONE SEEN   CASTS NONE SEEN    Old records or history reviewed: Labs and records from Dr. Tenny Craw reviewed.  The following images/tracing/specimen were independently visualized:  CT hematuria study shows a non-obstructing 10mm left renal pelvic stone. She has a small right renal stone. There are no bladder or renal lesions noted.    Assessment Assessed  1. Nephrolithiasis Of Both Kidneys 592.0 2. Gross Hematuria 599.71   She has bilateral stones with a 1cm left renal pelvic stone which is the probable cause of the bleeding.   Plan Gross Hematuria (599.71)  1. AU CT-HEMATURIA PROTOCOL  Done: 03Jun2014 12:00AM 2. BUN & CREATININE  Done: 03Jun2014 10:46AM Health Maintenance (V70.0)  3. UA With REFLEX  Done: 03Jun2014 10:00AM Nephrolithiasis Of Both Kidneys (592.0)  4. Follow-up Schedule Surgery Office  Follow-up  Requested for: 03Jun2014   I discussed ESWL, ureteroscopy and PCNL and believe that her best option is ESWL and reviewed the risks of bleeding, infection, renal and adjacent organ  injury, need for secondary procedures, thrombotic events and sedation risks. I will get cardiac clearance for her.   Discussion/Summary  CC: Dr. Vianne Bulls.

## 2012-08-14 NOTE — Progress Notes (Signed)
Amlodipine omitted on arrival to short stay because patient took it at home prior to admission

## 2012-08-14 NOTE — Interval H&P Note (Signed)
History and Physical Interval Note:  She had a stent placed after presenting with post ESWL pain and was found to have a 1cm residual fragment that is going to be managed ureteroscopically today.   08/14/2012 8:55 AM  [name]  has presented today for surgery, with the diagnosis of left proximal stone   The various methods of treatment have been discussed with the patient and family. After consideration of risks, benefits and other options for treatment, the patient has consented to  Procedure(s): LEFT URETEROSCOPY WITH HOLMIUM LASER AND POSSIBLE  STENT PLACEMENT (Left)  HOLMIUM LASER APPLICATION (N/A) as a surgical intervention .  The patient's history has been reviewed, patient examined, no change in status, stable for surgery.  I have reviewed the patient's chart and labs.  Questions were answered to the patient's satisfaction.     Raevin Wierenga J

## 2012-08-14 NOTE — Brief Op Note (Signed)
08/14/2012  10:17 AM  PATIENT:  [name]  79 y.o. female  PRE-OPERATIVE DIAGNOSIS:  left proximal stone   POST-OPERATIVE DIAGNOSIS:  left proximal stone   PROCEDURE:  Procedure(s): LEFT URETEROSCOPY WITH HOLMIUM LASER AND POSSIBLE  STENT PLACEMENT (Left)  HOLMIUM LASER APPLICATION (Left)  SURGEON:  Surgeon(s) and Role:    * Anner Crete, MD - Primary  PHYSICIAN ASSISTANT:   ASSISTANTS: none   ANESTHESIA:   general  EBL:     BLOOD ADMINISTERED:none  DRAINS: 6x24 left JJ stent   LOCAL MEDICATIONS USED:  NONE  SPECIMEN:  Source of Specimen:  left ureteral and renal stones.   DISPOSITION OF SPECIMEN:  to family  COUNTS:  YES  TOURNIQUET:  * No tourniquets in log *  DICTATION: .Other Dictation: Dictation Number [id number]  PLAN OF CARE: Discharge to home after PACU  PATIENT DISPOSITION:  PACU - hemodynamically stable.   Delay start of Pharmacological VTE agent (>24hrs) due to surgical blood loss or risk of bleeding: not applicable

## 2012-08-14 NOTE — Op Note (Signed)
NAMERINNAH, PEPPEL NO.:  [id number]  MEDICAL RECORD NO.:  [id number]  LOCATION:  1439                         FACILITY:  [facility]  PHYSICIAN:  Excell Seltzer. Annabell Howells, M.D.    DATE OF BIRTH:  Sep 19, 1929  DATE OF PROCEDURE:  08/14/2012 DATE OF DISCHARGE:                              OPERATIVE REPORT   PROCEDURE:  Cystoscopy, removal of left double-J stent, left ureterostomy with holmium laser lithotripsy of left proximal and left lower pole renal stones, placement of left double-J stent.  PREOPERATIVE DIAGNOSIS:  Left proximal ureteral stone.  POSTOPERATIVE DIAGNOSIS:  Left proximal ureteral stone with left lower pole stone.  SURGEON:  Excell Seltzer. Annabell Howells, MD  ANESTHESIA:  General.  SPECIMEN:  Stone fragments.  DRAINS:  A 6-French 24 cm left double-J stent.  BLOOD LOSS:  Minimal.  COMPLICATIONS:  None.  INDICATIONS:  Ms. Knarr is an 79 year old white female, who underwent lithotripsy earlier in June.  Post procedure, she had approximately 8-10 mm fragment in the proximal ureter that required placement of a double-J stent.  She returns now for definitive therapy of the obstructing fragment.  FINDINGS AND PROCEDURE:  She was given Cipro.  She was taken to the operating room where general anesthetic was induced.  She was placed in lithotomy position.  Her perineum and genitalia were prepped with Betadine solution.  She was draped in usual sterile fashion.  Cystoscopy was performed using the 22-French scope and 12-degree lens. The left ureteral stent was visualized and grasped with grasping forceps and moved to the urethral meatus.  The sensor guidewire was then passed to the kidney and the stent was removed.  The 6.4-French short ureteroscope was then advanced alongside the wire through her widely dilated ureter.  Approximately 5-6 mm stone was noted in the mid ureteral area.  This was extracted with a basket.  More approximately, the larger approximately 10 mm  fragment was identified. This was grasped with a basket but was too large to remove, so it was replaced in the proximal ureter.  I then used a 200 micron smooth tip holmium laser fiber set on 0.5 watts and 20 hertz to fragment the stone in the proximal ureter.  After initial fragmentation, additional fragments were removed with the basket,  however, the largest fragment flushed back into the kidney.  At this point, a 2nd guidewire was placed and a 38 cm 14-French digital access sheath was advanced into the proximal ureter.  The wire and core were removed and the flexible digital ureteroscope was then passed. Large stone fragment was identified in the upper pole of the kidney. Additionally on further inspection, there were some fragments in the lower pole of the kidney that were felt to require laser therapy.  The 200 micron laser fiber was passed.  The lower pole stone was then fragmented into manageable fragments.  The Nitinol basket was then used again to remove some of the lower pole fragments but several were too small at 1-2 mm to extract.  I then turned my attention to the upper pole, a large fragment which was then reengaged with the laser and broken into manageable pieces which were then were extracted  with the basket once again leaving some small 1- 2 mm fragments.  On fluoroscopy, there were some calcifications superior to what I felt was the upper pole but then had a configuration that was suggestive of caliceal stones.  So once I had removed all the significant fragments I could detect in the collecting system, I was working in.  I performed a retrograde pyelogram through the instrument which confirmed that the additional calcifications were extrarenal. At this point, the ureteroscope was backed out under visual inspection. No significant ureteral injury was noted.  Once the access sheath was removed, the cystoscope was reinserted over the wire and a fresh 6- French 24  cm double-J stent with string was inserted over the wire to the kidney under fluoroscopic guidance.  Wire was removed leaving good coil in the kidney, a good coil in the bladder.  The bladder was drained.  The cystoscope was removed with the stent string exiting urethra.  The string was tied close to the meatus and trimmed.  The patient was then taken down from lithotomy position.  Her anesthetic was reversed.  She was moved to recovery room in stable condition.  There were no complications during the procedure.     Excell Seltzer. Annabell Howells, M.D.     JJW/MEDQ  D:  08/14/2012  T:  08/14/2012  Job:  [id number]

## 2012-08-14 NOTE — Anesthesia Postprocedure Evaluation (Signed)
  Anesthesia Post-op Note  Patient: [name]  Procedure(s) Performed: Procedure(s) (LRB): LEFT URETEROSCOPY WITH HOLMIUM LASER AND LEFT  STENT PLACEMENT (Left)  HOLMIUM LASER APPLICATION (Left)  Patient Location: PACU  Anesthesia Type: General  Level of Consciousness: awake and alert   Airway and Oxygen Therapy: Patient Spontanous Breathing  Post-op Pain: mild  Post-op Assessment: Post-op Vital signs reviewed, Patient's Cardiovascular Status Stable, Respiratory Function Stable, Patent Airway and No signs of Nausea or vomiting  Last Vitals:  Filed Vitals:   08/14/12 1026  BP: 148/86  Pulse: 81  Temp: 36.5 C  Resp: 16    Post-op Vital Signs: stable   Complications: No apparent anesthesia complications

## 2012-08-14 NOTE — Transfer of Care (Signed)
Immediate Anesthesia Transfer of Care Note  Patient: [name]  Procedure(s) Performed: Procedure(s): LEFT URETEROSCOPY WITH HOLMIUM LASER AND LEFT  STENT PLACEMENT (Left)  HOLMIUM LASER APPLICATION (Left)  Patient Location: PACU  Anesthesia Type:General  Level of Consciousness: awake, alert , oriented and patient cooperative  Airway & Oxygen Therapy: Patient Spontanous Breathing and Patient connected to face mask oxygen  Post-op Assessment: Report given to PACU RN and Post -op Vital signs reviewed and stable  Post vital signs: Reviewed and stable  Complications: No apparent anesthesia complications

## 2012-08-15 ENCOUNTER — Encounter (HOSPITAL_COMMUNITY): Payer: Self-pay | Admitting: Urology

## 2012-08-15 NOTE — Discharge Summary (Signed)
Physician Discharge Summary  Patient ID: [name] MRN: [id number] DOB/AGE: [birth year] 79 y.o.  Admit date: 08/14/2012 Discharge date: 08/15/2012  Admission Diagnoses:  Left ureteral stone  Discharge Diagnoses: Left ureteral stone.  Active Problems:  Left ureteral stone  Discharged Condition: good  Hospital Course: [name] had a left ureterscopic stone extraction with stent insertion on 7/1.   She is doing well without complaints.   Consults: None  Significant Diagnostic Studies: none  Treatments: surgery: as above  Discharge Exam:  Gen: WD, WN in NAD,  GI soft and non-tender. Blood pressure 104/89, pulse 82, temperature 98.3 F (36.8 C), temperature source Oral, resp. rate 20, height 5\' 5"  (1.651 m), weight 78.019 kg (172 lb), SpO2 95.00%.   Disposition: 01-Home or Self Care   Future Appointments Provider Department Dept Phone   08/22/2012 3:00 PM [facility] [phone]   08/22/2012 3:30 PM Reather Littler, MD [facility] [phone]   08/27/2012 12:30 PM [facility] [place] [facility] [address] [phone]   10/18/2012 2:30 PM Rachael Fee [facility] [phone]   10/18/2012 3:00 PM Josph Macho, MD [facility] [phone]       Medication List         alendronate 10 MG tablet  Commonly known as:  FOSAMAX  Take 10 mg by mouth every Friday. She takes in the morning.     ALPRAZolam 0.5 MG tablet  Commonly known as:  XANAX  Take 0.5 mg by mouth at bedtime.     aspirin EC 81 MG tablet  Take 81 mg by mouth every morning.     AZOR 5-20 MG per tablet  Generic drug:  amLODipine-olmesartan  Take 1 tablet by mouth every morning.     cholecalciferol 1000 UNITS tablet  Commonly known as:  VITAMIN D  Take 2,000 Units by mouth 2 (two) times daily.     furosemide 40 MG tablet  Commonly known as:  LASIX  Take 40 mg by  mouth every morning.     HYDROcodone-acetaminophen 5-325 MG per tablet  Commonly known as:  NORCO  Take 1 tablet by mouth every 6 (six) hours as needed for pain.     metoprolol tartrate 25 MG tablet  Commonly known as:  LOPRESSOR  Take 12.5 mg by mouth 2 (two) times daily.     multivitamin with minerals Tabs  Take 1 tablet by mouth every morning.     omeprazole 20 MG capsule  Commonly known as:  PRILOSEC  Take 20 mg by mouth every morning.     ondansetron 4 MG disintegrating tablet  Commonly known as:  ZOFRAN ODT  Take 1 tablet (4 mg total) by mouth every 8 (eight) hours as needed for nausea.     potassium chloride SA 20 MEQ tablet  Commonly known as:  K-DUR,KLOR-CON  Take 20 mEq by mouth daily after supper.     simvastatin 20 MG tablet  Commonly known as:  ZOCOR  Take 20 mg by mouth daily after supper.     VITAMIN B COMPLEX PO  Take 1 tablet by mouth every morning.           Follow-up Information   Follow up with Anner Crete, MD. (as scheduled. )    Contact information:   [address] [place] [address] [phone]       Signed: Bjorn Pippin  J 08/15/2012, 7:15 AM

## 2012-08-20 ENCOUNTER — Ambulatory Visit: Payer: Medicare Other

## 2012-08-22 ENCOUNTER — Other Ambulatory Visit: Payer: Medicare Other

## 2012-08-22 ENCOUNTER — Other Ambulatory Visit: Payer: Self-pay | Admitting: *Deleted

## 2012-08-22 ENCOUNTER — Ambulatory Visit: Payer: Medicare Other | Admitting: Endocrinology

## 2012-08-22 MED ORDER — ALENDRONATE SODIUM 10 MG PO TABS: 10.0000 mg | ORAL_TABLET | ORAL | Status: DC

## 2012-08-27 ENCOUNTER — Ambulatory Visit (HOSPITAL_COMMUNITY): Payer: Medicare Other

## 2012-08-27 ENCOUNTER — Telehealth: Payer: Self-pay | Admitting: Cardiovascular Disease

## 2012-08-27 ENCOUNTER — Telehealth: Payer: Self-pay | Admitting: Endocrinology

## 2012-08-27 NOTE — Telephone Encounter (Signed)
Pt needs to get her pacemaker check and she said someone told her she would get it done this month. We do not have an appointment in there for her and she would like to know when she needs to come in for a check. Her last check was in April of 2014.

## 2012-08-27 NOTE — Telephone Encounter (Signed)
Message forwarded to S. Lelon Perla, CMA r/t device/monitor concerns.  Chart# [id number] on desk.

## 2012-08-28 NOTE — Telephone Encounter (Signed)
Pt needs a new rx for fosamax 70 mg, okay to send?

## 2012-08-28 NOTE — Telephone Encounter (Signed)
Do we have her old records? Does she have a follow up appointment?

## 2012-08-29 ENCOUNTER — Other Ambulatory Visit: Payer: Self-pay | Admitting: *Deleted

## 2012-08-29 MED ORDER — ALENDRONATE SODIUM 70 MG PO TABS: 70.0000 mg | ORAL_TABLET | ORAL | Status: DC

## 2012-08-29 NOTE — Telephone Encounter (Signed)
Left message with appointment date and time of ppm ck. Scheduler (Bille) aware that patient may call and want to be scheduled for a different appointment time.

## 2012-08-29 NOTE — Telephone Encounter (Signed)
Pt is aware of her rx at the pharmacy

## 2012-08-29 NOTE — Telephone Encounter (Signed)
She has an appt scheduled with you in October

## 2012-08-29 NOTE — Telephone Encounter (Signed)
Ok to refill 

## 2012-08-29 NOTE — Telephone Encounter (Signed)
Noted, rx sent.

## 2012-08-29 NOTE — Telephone Encounter (Signed)
Questions about medications. Please call back, pt is unsure how to take meds  [name]

## 2012-09-05 ENCOUNTER — Ambulatory Visit (INDEPENDENT_AMBULATORY_CARE_PROVIDER_SITE_OTHER): Payer: Medicare Other | Admitting: *Deleted

## 2012-09-05 DIAGNOSIS — I4891 Unspecified atrial fibrillation: Secondary | ICD-10-CM

## 2012-09-05 LAB — PACEMAKER DEVICE OBSERVATION

## 2012-09-05 NOTE — Patient Instructions (Addendum)
Follow up in October 2014 for pacemaker interrogation.

## 2012-09-05 NOTE — Progress Notes (Signed)
In office pacemaker interrogation. Normal device function. No changes made this session. 

## 2012-09-05 NOTE — Progress Notes (Deleted)
Follow up in October for a pacemaker interrogation.

## 2012-09-07 ENCOUNTER — Ambulatory Visit (HOSPITAL_COMMUNITY)
Admission: RE | Admit: 2012-09-07 | Discharge: 2012-09-07 | Disposition: A | Discharge status: Home / Self Care | Payer: Medicare Other | Source: Ambulatory Visit | Attending: Cardiovascular Disease | Admitting: Cardiovascular Disease

## 2012-09-07 DIAGNOSIS — I10 Essential (primary) hypertension: Secondary | ICD-10-CM

## 2012-09-07 DIAGNOSIS — I509 Heart failure, unspecified: Secondary | ICD-10-CM | POA: Insufficient documentation

## 2012-09-07 DIAGNOSIS — I4891 Unspecified atrial fibrillation: Secondary | ICD-10-CM | POA: Insufficient documentation

## 2012-09-07 DIAGNOSIS — I059 Rheumatic mitral valve disease, unspecified: Secondary | ICD-10-CM | POA: Insufficient documentation

## 2012-09-07 DIAGNOSIS — E785 Hyperlipidemia, unspecified: Secondary | ICD-10-CM | POA: Insufficient documentation

## 2012-09-07 NOTE — Progress Notes (Signed)
[facility]   2D echo completed 09/07/2012.   Cindy Hayward Rylander, RDCS  

## 2012-09-13 ENCOUNTER — Encounter: Payer: Self-pay | Admitting: Cardiovascular Disease

## 2012-10-18 ENCOUNTER — Other Ambulatory Visit (HOSPITAL_BASED_OUTPATIENT_CLINIC_OR_DEPARTMENT_OTHER): Payer: Medicare Other | Admitting: Lab

## 2012-10-18 ENCOUNTER — Ambulatory Visit (HOSPITAL_BASED_OUTPATIENT_CLINIC_OR_DEPARTMENT_OTHER): Payer: Medicare Other | Admitting: Hematology & Oncology

## 2012-10-18 VITALS — BP 126/59 | HR 73 | Temp 97.7°F | Resp 16 | Ht 66.0 in | Wt 178.0 lb

## 2012-10-18 DIAGNOSIS — M1380 Other specified arthritis, unspecified site: Secondary | ICD-10-CM

## 2012-10-18 DIAGNOSIS — D696 Thrombocytopenia, unspecified: Secondary | ICD-10-CM

## 2012-10-18 DIAGNOSIS — D693 Immune thrombocytopenic purpura: Secondary | ICD-10-CM

## 2012-10-18 DIAGNOSIS — M7989 Other specified soft tissue disorders: Secondary | ICD-10-CM

## 2012-10-18 LAB — CBC WITH DIFFERENTIAL (CANCER CENTER ONLY)
BASO#: 0.1 10*3/uL (ref 0.0–0.2)
EOS%: 2.9 % (ref 0.0–7.0)
Eosinophils Absolute: 0.2 10*3/uL (ref 0.0–0.5)
HCT: 38.1 % (ref 34.8–46.6)
HGB: 12.1 g/dL (ref 11.6–15.9)
LYMPH#: 1.7 10*3/uL (ref 0.9–3.3)
MCH: 30.4 pg (ref 26.0–34.0)
MCHC: 31.8 g/dL — ABNORMAL LOW (ref 32.0–36.0)
NEUT#: 2.8 10*3/uL (ref 1.5–6.5)
NEUT%: 52.9 % (ref 39.6–80.0)
RBC: 3.98 10*6/uL (ref 3.70–5.32)

## 2012-10-18 NOTE — Progress Notes (Signed)
This office note has been dictated.

## 2012-10-19 NOTE — Progress Notes (Signed)
CC:   Magnus Sinning) Tenny Craw, M.D.  DIAGNOSIS:  Chronic immune thrombocytopenia.  CURRENT THERAPY:  Observation.  INTERIM HISTORY:  Ms. Bessinger comes in for a followup.  She is doing well.  She has had no problem with bleeding or bruising.  She did have some problems with some kidney stones early this year.  These seem to be taken care of right now.  She has a lot of arthritis.  It appears as though she has a lot of swelling in her knees.  Again, she has had no problems with bleeding or bruising.  PHYSICAL EXAMINATION:  General:  On exam, this is an elderly white female in no obvious distress.  Vital Signs:  Showed a temperature of 97.7, pulse 73, respiratory rate 16, blood pressure 126/59.  Weight is 178.  Head/Neck:  Exam shows a normocephalic and atraumatic skull. There are no ocular or oral lesions.  There are no palpable cervical or supraclavicular lymph nodes.  Lungs:  Clear bilaterally.  Cardiac: Regular rate and rhythm with a normal S1 and S2.  There are no murmurs, rubs, or bruits.  Abdomen:  Soft.  She has good bowel sounds.  There is no fluid wave.  There is no palpable hepatosplenomegaly.  Extremities: Show osteoarthritic changes in her joints.  She really has bad changes in her hands.  Skin:  No rashes, ecchymosis, or petechia.  LABORATORY STUDIES:  White blood cell count is 5.2, hemoglobin 12.1, hematocrit 38.1, and platelet count 91,000.  MCV is 96.  IMPRESSION:  Ms. Kocsis is a very charming 79 year old white female with thrombocytopenia.  Again, this is immune thrombocytopenia.  I am not worried at all regarding this thrombocytopenia.  I do not see that this is going to be an issue for her.  I think we will try get her back in 6 months' time now.  I do not see any need for blood work in between visits.    ______________________________ Josph Macho, M.D. PRE/MEDQ  D:  10/18/2012  T:  10/19/2012  Job:  [id number]

## 2012-10-25 ENCOUNTER — Other Ambulatory Visit: Payer: Self-pay | Admitting: Dermatology

## 2012-11-09 ENCOUNTER — Other Ambulatory Visit: Payer: Self-pay | Admitting: Cardiovascular Disease

## 2012-11-12 ENCOUNTER — Encounter: Payer: Self-pay | Admitting: *Deleted

## 2012-11-16 ENCOUNTER — Other Ambulatory Visit: Payer: Self-pay | Admitting: *Deleted

## 2012-11-21 ENCOUNTER — Ambulatory Visit: Payer: Medicare Other | Admitting: Endocrinology

## 2012-11-21 ENCOUNTER — Other Ambulatory Visit: Payer: Medicare Other

## 2012-11-28 ENCOUNTER — Encounter: Payer: Self-pay | Admitting: Endocrinology

## 2012-11-28 ENCOUNTER — Other Ambulatory Visit: Payer: Self-pay | Admitting: *Deleted

## 2012-11-28 ENCOUNTER — Ambulatory Visit (INDEPENDENT_AMBULATORY_CARE_PROVIDER_SITE_OTHER): Payer: Medicare Other | Admitting: Endocrinology

## 2012-11-28 ENCOUNTER — Other Ambulatory Visit (INDEPENDENT_AMBULATORY_CARE_PROVIDER_SITE_OTHER): Payer: Medicare Other

## 2012-11-28 DIAGNOSIS — E039 Hypothyroidism, unspecified: Secondary | ICD-10-CM | POA: Insufficient documentation

## 2012-11-28 DIAGNOSIS — M8580 Other specified disorders of bone density and structure, unspecified site: Secondary | ICD-10-CM

## 2012-11-28 DIAGNOSIS — M899 Disorder of bone, unspecified: Secondary | ICD-10-CM

## 2012-11-28 LAB — BASIC METABOLIC PANEL
BUN: 19 mg/dL (ref 6–23)
CO2: 30 mEq/L (ref 19–32)
Chloride: 105 mEq/L (ref 96–112)
Potassium: 4.2 mEq/L (ref 3.5–5.1)

## 2012-11-28 LAB — TSH: TSH: 2.27 u[IU]/mL (ref 0.35–5.50)

## 2012-11-28 LAB — T4, FREE: Free T4: 0.79 ng/dL (ref 0.60–1.60)

## 2012-11-28 NOTE — Patient Instructions (Signed)
Continue alendronate weekly on empty stomach

## 2012-11-28 NOTE — Progress Notes (Signed)
Patient ID: [name], female   DOB: 20-Apr-1929, 79 y.o.   MRN: [id number]  Reason for Appointment:  Hyperparathyroidism, followup visit    History of Present Illness:   The patient's previous records are not available It is unknown how long she has had hypercalcemia but she also has had history of a ureteral stone and osteopenia Because of her age and mild hypercalcemia she is not a candidate for surgery and has been treated with Fosamax for prevention of osteoporosis and fracture She has been taking this weekly without any side effects for at least 6 months now  She feels well and has had no complaints No weight loss or change in appetite  LABS:  Appointment on 11/28/2012  Component Date Value Range Status  . TSH 11/28/2012 2.27  0.35 - 5.50 uIU/mL Final  . Free T4 11/28/2012 0.79  0.60 - 1.60 ng/dL Final  . Calcium 40/98/1191 10.4  8.4 - 10.5 mg/dL Final  . Sodium 47/82/9562 141  135 - 145 mEq/L Final  . Potassium 11/28/2012 4.2  3.5 - 5.1 mEq/L Final  . Chloride 11/28/2012 105  96 - 112 mEq/L Final  . CO2 11/28/2012 30  19 - 32 mEq/L Final  . Glucose, Bld 11/28/2012 98  70 - 99 mg/dL Final  . BUN 13/09/6576 19  6 - 23 mg/dL Final  . Creatinine, Ser 11/28/2012 1.1  0.4 - 1.2 mg/dL Final  . Calcium 46/96/2952 10.4  8.4 - 10.5 mg/dL Final  . GFR 84/13/2440 52.28* >60.00 mL/min Final      Medication List       This list is accurate as of: 11/28/12 11:59 PM.  Always use your most recent med list.               alendronate 70 MG tablet  Commonly known as:  FOSAMAX  Take 1 tablet (70 mg total) by mouth every 7 (seven) days. Take with a full glass of water on an empty stomach.     ALPRAZolam 0.5 MG tablet  Commonly known as:  XANAX  Take 0.5 mg by mouth at bedtime.     aspirin EC 81 MG tablet  Take 81 mg by mouth every morning.     AZOR 5-20 MG per tablet  Generic drug:  amLODipine-olmesartan  TAKE 1 TABLET EVERY DAY     cholecalciferol 1000 UNITS tablet   Commonly known as:  VITAMIN D  Take 2,000 Units by mouth 2 (two) times daily.     furosemide 40 MG tablet  Commonly known as:  LASIX  Take 40 mg by mouth every morning.     metoprolol tartrate 25 MG tablet  Commonly known as:  LOPRESSOR  Take 12.5 mg by mouth 2 (two) times daily.     multivitamin with minerals Tabs tablet  Take 1 tablet by mouth every morning.     omeprazole 20 MG capsule  Commonly known as:  PRILOSEC  Take 20 mg by mouth every morning.     potassium chloride SA 20 MEQ tablet  Commonly known as:  K-DUR,KLOR-CON  Take 20 mEq by mouth daily after supper.     simvastatin 20 MG tablet  Commonly known as:  ZOCOR  Take 20 mg by mouth daily after supper.     VITAMIN B COMPLEX PO  Take 1 tablet by mouth every morning.        Past Medical History  Diagnosis Date  . Thrombocytopenia 01/20/2011  . Dysrhythmia  Atrial Fib  . CHF (congestive heart failure)   . Edema of both legs     bilateral venous ablation procedures done in IR, L>R  . Hypertension   . Mitral valve insufficiency   . Pacemaker Jan 2009    dual chamber   . Venous insufficiency, peripheral   . Kidney stone on left side 07/2012  . H/O hiatal hernia   . low platelet     Dr Myna Hidalgo at [facility]  . Hypercholesteremia   . Anxiety   . Depression   . Pneumonia     severe x1  . Cancer     skin cancer on forehead removed  . Arthritis     Past Surgical History  Procedure Laterality Date  . Mitral valve replacement  04/03/2009    Dr Morton Peters  . Hand surgery Right 05/2011    right hand   . Cardiac valve replacement  03/2009  . Permanent pacemaker insertion  02/26/2007    [employer]  . Cystoscopy w/ ureteral stent placement Left 08/01/2012    Procedure: CYSTOSCOPY WITH RETROGRADE PYELOGRAM/URETERAL STENT PLACEMENT;  Surgeon: Crecencio Mc, MD;  Location: [facility];  Service: Urology;  Laterality: Left;  . Abdominal hysterectomy  1982  . Cystoscopy with ureteroscopy Left 08/14/2012    Procedure:  LEFT URETEROSCOPY WITH HOLMIUM LASER AND LEFT  STENT PLACEMENT;  Surgeon: Anner Crete, MD;  Location: [facility];  Service: Urology;  Laterality: Left;  . Holmium laser application Left 08/14/2012    Procedure:  HOLMIUM LASER APPLICATION;  Surgeon: Anner Crete, MD;  Location: [facility];  Service: Urology;  Laterality: Left;  . Cardiac catheterization  03/27/2009    normal coronaries; grade IV Mitral Insuff.  . US echocardiography  08/24/2011    RV mildly dilated,LA & RA severely dilated,trace MR,mod. TR,mild-mod pulmonary hypertension,AOV mildly sclerotic,mild -mod AI,mild PI  . Nm myocar perf wall motion  03/01/1999    mild lateral ischemia    Family History  Problem Relation Age of Onset  . Cancer Mother   . Heart attack Father     Social History:  reports that she has never smoked. She has never used smokeless tobacco. She reports that she does not drink alcohol or use illicit drugs.  Allergies: No Known Allergies  REVIEW of SYSTEMS:  She has had history of hypercholesterolemia  Her appetite is normal     Examination:   BP 134/82  Pulse 79  Temp(Src) 98.5 F (36.9 C)  Resp 12  Ht 5\' 5"  (1.651 m)  Wt 184 lb 3.2 oz (83.553 kg)  BMI 30.65 kg/m2  SpO2 93%   GENERAL APPEARANCE: Alert And looks well.             NECK: no thyromegaly or adenopathy            Assessments   Hyperparathyroidism, mild and stable Associated osteopenia Since her complete record is not available will review it when available and addendum made   Treatment:  Continue taking Fosamax She will also continue taking low dose vitamin D  Zenola Dezarn 12/03/2012, 3:10 PM

## 2012-11-29 NOTE — Progress Notes (Signed)
Quick Note:  Please let patient know that the calcium result is normal and no further action needed, followup in 6 months ______

## 2013-01-04 ENCOUNTER — Emergency Department (HOSPITAL_COMMUNITY): Payer: No Typology Code available for payment source

## 2013-01-04 ENCOUNTER — Emergency Department (HOSPITAL_COMMUNITY)
Admission: EM | Admit: 2013-01-04 | Discharge: 2013-01-04 | Disposition: A | Discharge status: Home / Self Care | Payer: No Typology Code available for payment source | Attending: Emergency Medicine | Admitting: Emergency Medicine

## 2013-01-04 ENCOUNTER — Encounter (HOSPITAL_COMMUNITY): Payer: Self-pay | Admitting: Emergency Medicine

## 2013-01-04 DIAGNOSIS — Y92410 Unspecified street and highway as the place of occurrence of the external cause: Secondary | ICD-10-CM | POA: Insufficient documentation

## 2013-01-04 DIAGNOSIS — I509 Heart failure, unspecified: Secondary | ICD-10-CM | POA: Insufficient documentation

## 2013-01-04 DIAGNOSIS — S298XXA Other specified injuries of thorax, initial encounter: Secondary | ICD-10-CM | POA: Insufficient documentation

## 2013-01-04 DIAGNOSIS — Z87442 Personal history of urinary calculi: Secondary | ICD-10-CM | POA: Insufficient documentation

## 2013-01-04 DIAGNOSIS — F411 Generalized anxiety disorder: Secondary | ICD-10-CM | POA: Insufficient documentation

## 2013-01-04 DIAGNOSIS — F329 Major depressive disorder, single episode, unspecified: Secondary | ICD-10-CM | POA: Insufficient documentation

## 2013-01-04 DIAGNOSIS — I10 Essential (primary) hypertension: Secondary | ICD-10-CM | POA: Insufficient documentation

## 2013-01-04 DIAGNOSIS — Z9861 Coronary angioplasty status: Secondary | ICD-10-CM | POA: Insufficient documentation

## 2013-01-04 DIAGNOSIS — Y9389 Activity, other specified: Secondary | ICD-10-CM | POA: Insufficient documentation

## 2013-01-04 DIAGNOSIS — Z862 Personal history of diseases of the blood and blood-forming organs and certain disorders involving the immune mechanism: Secondary | ICD-10-CM | POA: Insufficient documentation

## 2013-01-04 DIAGNOSIS — Z8701 Personal history of pneumonia (recurrent): Secondary | ICD-10-CM | POA: Insufficient documentation

## 2013-01-04 DIAGNOSIS — F3289 Other specified depressive episodes: Secondary | ICD-10-CM | POA: Insufficient documentation

## 2013-01-04 DIAGNOSIS — Z79899 Other long term (current) drug therapy: Secondary | ICD-10-CM | POA: Insufficient documentation

## 2013-01-04 DIAGNOSIS — M129 Arthropathy, unspecified: Secondary | ICD-10-CM | POA: Insufficient documentation

## 2013-01-04 DIAGNOSIS — Z950 Presence of cardiac pacemaker: Secondary | ICD-10-CM | POA: Insufficient documentation

## 2013-01-04 DIAGNOSIS — E7800 Pure hypercholesterolemia, unspecified: Secondary | ICD-10-CM | POA: Insufficient documentation

## 2013-01-04 DIAGNOSIS — Z7982 Long term (current) use of aspirin: Secondary | ICD-10-CM | POA: Insufficient documentation

## 2013-01-04 DIAGNOSIS — Z85828 Personal history of other malignant neoplasm of skin: Secondary | ICD-10-CM | POA: Insufficient documentation

## 2013-01-04 LAB — COMPREHENSIVE METABOLIC PANEL
ALT: 14 U/L (ref 0–35)
BUN: 20 mg/dL (ref 6–23)
CO2: 26 mEq/L (ref 19–32)
Calcium: 10.5 mg/dL (ref 8.4–10.5)
Creatinine, Ser: 1.01 mg/dL (ref 0.50–1.10)
GFR calc Af Amer: 59 mL/min — ABNORMAL LOW (ref >90)
GFR calc non Af Amer: 51 mL/min — ABNORMAL LOW (ref >90)
Glucose, Bld: 99 mg/dL (ref 70–99)
Potassium: 5 mEq/L (ref 3.5–5.1)

## 2013-01-04 LAB — CBC WITH DIFFERENTIAL/PLATELET
Basophils Relative: 1 % (ref 0–1)
Eosinophils Absolute: 0.1 10*3/uL (ref 0.0–0.7)
Eosinophils Relative: 2 % (ref 0–5)
HCT: 36.2 % (ref 36.0–46.0)
Lymphocytes Relative: 15 % (ref 12–46)
Lymphs Abs: 0.9 10*3/uL (ref 0.7–4.0)
MCH: 31.3 pg (ref 26.0–34.0)
MCHC: 33.1 g/dL (ref 30.0–36.0)
MCV: 94.5 fL (ref 78.0–100.0)
Monocytes Absolute: 0.6 10*3/uL (ref 0.1–1.0)
Monocytes Relative: 9 % (ref 3–12)
RBC: 3.83 MIL/uL — ABNORMAL LOW (ref 3.87–5.11)
RDW: 15.2 % (ref 11.5–15.5)
WBC: 6.2 10*3/uL (ref 4.0–10.5)

## 2013-01-04 LAB — LACTIC ACID, PLASMA: Lactic Acid, Venous: 1 mmol/L (ref 0.5–2.2)

## 2013-01-04 MED ORDER — IOHEXOL 300 MG/ML  SOLN
75.0000 mL | Freq: Once | INTRAMUSCULAR | Status: AC | PRN
  Administered 2013-01-04: 75 mL via INTRAVENOUS

## 2013-01-04 NOTE — ED Notes (Signed)
Pt to ct 

## 2013-01-04 NOTE — ED Notes (Signed)
Pt's Son-in-law [phone]

## 2013-01-04 NOTE — ED Notes (Signed)
Sandwich given 

## 2013-01-04 NOTE — ED Notes (Signed)
The pt just returned from xray 
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
## 2013-01-04 NOTE — ED Notes (Signed)
The pt  Up to the br with assistance.  Her son-in-law has called and she talked with him hes coming.  Her husband is with him.  She is worried about her  Husband.  She is hungry

## 2013-01-04 NOTE — ED Notes (Signed)
The pt returned from c-t 
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
## 2013-01-06 NOTE — ED Provider Notes (Signed)
I have personally seen and examined the patient.  I have discussed the plan of care with the resident.  I have reviewed the documentation on PMH/FH/Soc. History.  I have reviewed the documentation of the resident and agree.  I have reviewed and agree with the ECG interpretation(s) documented by the resident.  Pt well appearing on my examination, I feel she is safe/stable for outpatient management   Joya Gaskins, MD 01/06/13 1717

## 2013-01-18 ENCOUNTER — Telehealth: Payer: Self-pay | Admitting: *Deleted

## 2013-01-18 NOTE — Telephone Encounter (Signed)
PPM ck scheduled for 01/21/2013 @ 2PM

## 2013-01-18 NOTE — Telephone Encounter (Signed)
Pt was calling in regards to her pacemaker. She specifically asked for you.

## 2013-01-21 ENCOUNTER — Ambulatory Visit (INDEPENDENT_AMBULATORY_CARE_PROVIDER_SITE_OTHER): Payer: Medicare Other | Admitting: *Deleted

## 2013-01-21 DIAGNOSIS — I4891 Unspecified atrial fibrillation: Secondary | ICD-10-CM

## 2013-01-21 LAB — PACEMAKER DEVICE OBSERVATION

## 2013-01-21 LAB — MDC_IDC_ENUM_SESS_TYPE_INCLINIC
Battery Impedance: 1076 Ohm
Battery Remaining Longevity: 5
Battery Voltage: 2.78 V
Lead Channel Pacing Threshold Amplitude: 0.75 V
Lead Channel Pacing Threshold Pulse Width: 0.4 ms
Lead Channel Sensing Intrinsic Amplitude: 11.2 mV

## 2013-01-21 NOTE — Patient Instructions (Signed)
Your physician recommends that you schedule a follow-up appointment in: 3 months with Dr. Croitoru.  

## 2013-01-21 NOTE — Progress Notes (Signed)
Pacemaker check in clinic. Normal device function. Threshold, sensing, and impedance consistent with previous measurements. Device programmed to maximize longevity. Permanent AF + ASA. 3 high ventricular rates noted---max dur. "6" sec, Max V 167---morph change in 1 episode while others appeared to be conducted AF with no change in morph. RV output was increased to 2.5V for greater safety margin. Histogram distribution appropriate for patient activity level. Device programmed to optimize intrinsic conduction. Estimated longevity 5 years. Patient will follow up with [facility] in 3 months.

## 2013-04-04 ENCOUNTER — Telehealth: Payer: Self-pay | Admitting: Hematology & Oncology

## 2013-04-04 NOTE — Telephone Encounter (Signed)
Patient called and c

## 2013-04-04 NOTE — Telephone Encounter (Signed)
Patient called and cx 04/17/13 apt and resch for 05/09/13

## 2013-04-16 ENCOUNTER — Telehealth: Payer: Self-pay | Admitting: Cardiovascular Disease

## 2013-04-16 NOTE — Telephone Encounter (Signed)
I saw she has an appt to see me on March 9. Will address after I examine her

## 2013-04-16 NOTE — Telephone Encounter (Signed)
Need to request clarence for surgery to remove hi kidney stones please. Please fax [phone] Att:Pam

## 2013-04-16 NOTE — Telephone Encounter (Signed)
Forwarded to Dr. Darlen Round, CMA.

## 2013-04-16 NOTE — Telephone Encounter (Signed)
Paper chart placed on Dr. Victorino December cart.

## 2013-04-17 ENCOUNTER — Other Ambulatory Visit: Payer: Medicare Other | Admitting: Lab

## 2013-04-17 ENCOUNTER — Ambulatory Visit: Payer: Medicare Other | Admitting: Hematology & Oncology

## 2013-04-22 ENCOUNTER — Encounter: Payer: Self-pay | Admitting: Cardiovascular Disease

## 2013-04-22 ENCOUNTER — Ambulatory Visit (INDEPENDENT_AMBULATORY_CARE_PROVIDER_SITE_OTHER): Payer: Medicare Other | Admitting: Cardiovascular Disease

## 2013-04-22 ENCOUNTER — Encounter: Payer: Self-pay | Admitting: *Deleted

## 2013-04-22 VITALS — BP 138/62 | HR 80 | Ht 65.0 in | Wt 192.3 lb

## 2013-04-22 DIAGNOSIS — I4821 Permanent atrial fibrillation: Secondary | ICD-10-CM | POA: Insufficient documentation

## 2013-04-22 DIAGNOSIS — Z952 Presence of prosthetic heart valve: Secondary | ICD-10-CM

## 2013-04-22 DIAGNOSIS — E785 Hyperlipidemia, unspecified: Secondary | ICD-10-CM

## 2013-04-22 DIAGNOSIS — I10 Essential (primary) hypertension: Secondary | ICD-10-CM

## 2013-04-22 DIAGNOSIS — I4891 Unspecified atrial fibrillation: Secondary | ICD-10-CM

## 2013-04-22 DIAGNOSIS — Z953 Presence of xenogenic heart valve: Secondary | ICD-10-CM | POA: Insufficient documentation

## 2013-04-22 DIAGNOSIS — N201 Calculus of ureter: Secondary | ICD-10-CM

## 2013-04-22 LAB — PACEMAKER DEVICE OBSERVATION

## 2013-04-22 NOTE — Assessment & Plan Note (Signed)
Low risk of major cardiovascular complications with planned ureteroscopic stone extraction. Normal pacemaker function, not pacemaker dependent, no particular precautions need to be taken as far as her pacemaker is concerned prior to the procedure.

## 2013-04-22 NOTE — Patient Instructions (Addendum)
Your physician recommends that you schedule a follow-up appointment in: 3 months with the device clinic and 12 with Dr.Croitoru
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
## 2013-04-22 NOTE — Assessment & Plan Note (Signed)
I don't have the results of her most recent lipid profile, but I do believe we can be fairly liberal with her LDL cholesterol limits. She had normal coronaries by previous angiography. I agree with a reduced dose of statin.

## 2013-04-22 NOTE — Assessment & Plan Note (Signed)
Normal device function by physical exam and most recent echo performed in 2013. Needs endocarditis prophylaxis with invasive dental or urological procedures.

## 2013-04-22 NOTE — Assessment & Plan Note (Signed)
Slow ventricular response with ventricular pacing roughly 50% of the time. No high ventricular rates. At lower risk of embolic episodes secondary to ligation of the atrial appendage. At higher risk of bleeding D2 frequent falls. On aspirin for stroke prevention.

## 2013-04-23 ENCOUNTER — Other Ambulatory Visit: Payer: Self-pay | Admitting: Urology

## 2013-04-23 ENCOUNTER — Encounter (HOSPITAL_BASED_OUTPATIENT_CLINIC_OR_DEPARTMENT_OTHER): Payer: Self-pay | Admitting: *Deleted

## 2013-04-23 ENCOUNTER — Telehealth: Payer: Self-pay | Admitting: Hematology & Oncology

## 2013-04-23 LAB — MDC_IDC_ENUM_SESS_TYPE_INCLINIC
Battery Remaining Longevity: 61 mo
Battery Voltage: 2.78 V
Brady Statistic RV Percent Paced: 47 %
Date Time Interrogation Session: 20150309173122
Lead Channel Impedance Value: 67 Ohm
Lead Channel Pacing Threshold Amplitude: 0.75 V
Lead Channel Pacing Threshold Pulse Width: 0.4 ms
Lead Channel Sensing Intrinsic Amplitude: 8 mV
MDC IDC MSMT BATTERY IMPEDANCE: 1159 Ohm
MDC IDC MSMT LEADCHNL RV IMPEDANCE VALUE: 514 Ohm
MDC IDC SET LEADCHNL RV PACING AMPLITUDE: 2.5 V
MDC IDC SET LEADCHNL RV PACING PULSEWIDTH: 0.4 ms
MDC IDC SET LEADCHNL RV SENSING SENSITIVITY: 4 mV

## 2013-04-23 NOTE — Progress Notes (Addendum)
NPO AFTER MN. ARRIVE AT 0830. NEEDS ISTAT. CURRENT EKG AND  PACER CHECK IN EPIC AND CHART. WILL TAKE PRILOSEC, AZOR, AND LOPRESSOR AM DOS W/ SIPS OF WATER.    REVIEWED CHART W/ DR DENNENY MDA,  OK TO PROCEED.  AND NOTED THAT CARDIOLOGIST ADVISED USING ENDOCARDITIS PROPHAYLAXIS .  DR Seaford MDA DOS AND DR [facility] NEED DISCUSS THIS.

## 2013-04-23 NOTE — Telephone Encounter (Signed)
Patient called and cx 05/09/13 apt and resch for 06/05/13

## 2013-04-24 ENCOUNTER — Other Ambulatory Visit: Payer: Self-pay | Admitting: Urology

## 2013-04-24 NOTE — H&P (Signed)
tive Problems Problems   1. Bilateral kidney stones (592.0)  2. Calculus of right ureter (592.1)  3. Nephrolithiasis (592.0)  History of Present Illness        [name] has a history of nephrolithiasis.  She had a left renal ESWL on 07/26/12 and the required ureteroscopy for retained fragments.   She has a history of  a possible RUVJ stone that was initially seen on KUB in January and then again when she was seen on 2/18 for gross hematuria but the UA was clear and a culture just grew Mx species.  She has mild vulvar burning but no dysuria or urgency.  She has had no further hematuria or flank pain.  Her KUB shows no change in the position of the RUVJ stone.   Past Medical History Problems   1. History of Anxiety (300.00)  2. History of Atrial fibrillation (427.31)  3. History of Calculus of left ureter (592.1)  4. History of Flank Pain Left  5. History of Gout (274.9)  6. History of Gross hematuria (599.71)  7. History of esophageal reflux (V12.79)  8. History of hypercholesterolemia (V12.29)  9. History of hyperparathyroidism (V12.29)  10. History of hypertension (V12.59)  11. History of low back pain (V13.59)  12. History of thrombocytopenia (V12.3)  13. History of varicose veins (V12.59)  14. History of Osteoarthritis (V13.4)  15. History of Osteoporosis (733.00)  16. History of Thromboembolic Disease (B14.78)  17. History of Vitamin D deficiency (268.9)  Surgical History Problems   1. History of Cystoscopy With Insertion Of Ureteral Stent Left  2. History of Cystoscopy With Insertion Of Ureteral Stent Left  3. History of Cystoscopy With Ureteroscopy With Lithotripsy  4. History of Hand Surgery  5. History of Hysterectomy  6. History of Lithotripsy  7. History of Mitral Valve Repair  8. History of Pacemaker Placement  Current Meds  1. Alendronate Sodium 10 MG Oral Tablet;  Therapy: [id number] to Recorded  2. ALPRAZolam 0.25 MG Oral Tablet;  Therapy: [id number] to  Recorded  3. Aspirin 81 MG Oral Tablet;  Therapy: (XBMWUXLK:44WNU2725) to Recorded  4. Azor 5-20 MG Oral Tablet;  Therapy: (DGUYQIHK:74QVZ5638) to Recorded  5. Bactroban OINT;  Therapy: (VFIEPPIR:51OAC1660) to Recorded  6. Diprolene AF 0.05 % External Cream;  Therapy: (Recorded:03Jun2014) to Recorded  7. Fosamax TABS;  Therapy: (Recorded:29Oct2014) to Recorded  8. Furosemide 40 MG Oral Tablet;  Therapy: (Recorded:03Jun2014) to Recorded  9. Hydrocodone-Acetaminophen 5-325 MG Oral Tablet;  Therapy: 63KZS0109 to Recorded  10. Metoprolol Tartrate 25 MG Oral Tablet;   Therapy: (Recorded:03Jun2014) to Recorded  11. Multi-Vitamin TABS;   Therapy: (Recorded:03Jun2014) to Recorded  12. Omeprazole 20 MG Oral Capsule Delayed Release;   Therapy: (Recorded:03Jun2014) to Recorded  13. Ondansetron 4 MG Oral Tablet Dispersible;   Therapy: [id number] to Recorded  14. Potassium Chloride Crys ER 20 MEQ Oral Tablet Extended Release;   Therapy: (GURKYHCW:23JSE8315) to Recorded  15. Simvastatin 20 MG Oral Tablet;   Therapy: (Recorded:03Jun2014) to Recorded  16. Vitamin B Complex TABS;   Therapy: (Recorded:03Jun2014) to Recorded  17. Vitamin C TABS;   Therapy: (VVOHYWVP:71GGY6948) to Recorded  18. Vitamin D3 1000 UNIT Oral Capsule;   Therapy: (Recorded:03Jun2014) to Recorded  Allergies Medication   1. No Known Drug Allergies  Family History Problems   1. Family history of Death In The Family Father   heart  2. Family history of Death In The Family Mother   melanoma  3. Family history of Diabetes Mellitus (V18.0) :  Father  4. Family history of Family Health Status Number Of Children   1 son  1 daughter  Social History Problems   1. Denied: History of Alcohol Use  2. Denied: History of Caffeine Use  3. Marital History - Currently Married  4. Never A Smoker  5. Occupation: Retired  83. Denied: History of Tobacco Use (305.1)  Review of Systems  Genitourinary: no hematuria.   Gastrointestinal: no nausea and no flank pain.  Constitutional: no fever.    Vitals Vital Signs [Data Includes: Last 1 Day]  Recorded: [id number] 12:46PM  Blood Pressure: 129 / 69 Temperature: 96.9 F Heart Rate: 78  Physical Exam Constitutional: Well nourished and well developed . No acute distress.  Pulmonary: No respiratory distress and normal respiratory rhythm and effort.  Cardiovascular: Heart rate and rhythm are normal . No peripheral edema.  Abdomen: The abdomen is soft and nontender.    Results/Data Urine [Data Includes: Last 1 Day]   [id number]  COLOR YELLOW   APPEARANCE CLEAR   SPECIFIC GRAVITY 1.025   pH 6.0   GLUCOSE NEG mg/dL  BILIRUBIN NEG   KETONE NEG mg/dL  BLOOD TRACE   PROTEIN NEG mg/dL  UROBILINOGEN 0.2 mg/dL  NITRITE NEG   LEUKOCYTE ESTERASE NEG   SQUAMOUS EPITHELIAL/HPF FEW   WBC 3-6 WBC/hpf  RBC 0-2 RBC/hpf  BACTERIA RARE   CRYSTALS NONE SEEN   CASTS Hyaline casts noted    The following images/tracing/specimen were independently visualized:  KUB today shows no changes in the 10x36mm RUVJ stone. She has a possible small LLP stone but no other renal or ureteral stones are seen. She has calcified costal cartlidge overlying the left kidney and stable scolosis but no other abnormalities.  The following clinical lab reports were reviewed:  UA and culture reviewed.    Assessment Assessed   1. Calculus of right ureter (592.1)   She still has a 10x62mm RUVJ stone.   Plan Calculus of right ureter   1. Follow-up Schedule Surgery Office  Follow-up  Status: Hold For - Appointment   Requested for: [id number] Health Maintenance   2. UA With REFLEX; [Do Not Release]; Status:Resulted - Requires Verification;   Done:  88FOY7741 12:06PM   I am going to get her set up for a ureteroscopic extraction and reviewed the risks of bleeding, infection, ureteral injury, need for stent or secondary procedures, thrombotic events and anesthetic complications.    Discussion/Summary  CC: Dr. Myriam Jacobson, Dr. Burney Gauze and [facility].

## 2013-04-25 ENCOUNTER — Encounter (HOSPITAL_BASED_OUTPATIENT_CLINIC_OR_DEPARTMENT_OTHER): Payer: Self-pay

## 2013-04-25 ENCOUNTER — Ambulatory Visit (HOSPITAL_BASED_OUTPATIENT_CLINIC_OR_DEPARTMENT_OTHER): Payer: Medicare Other | Admitting: Certified Registered"

## 2013-04-25 ENCOUNTER — Ambulatory Visit (HOSPITAL_BASED_OUTPATIENT_CLINIC_OR_DEPARTMENT_OTHER)
Admission: RE | Admit: 2013-04-25 | Discharge: 2013-04-25 | Disposition: A | Discharge status: Home / Self Care | Payer: Medicare Other | Source: Ambulatory Visit | Attending: Urology | Admitting: Urology

## 2013-04-25 ENCOUNTER — Encounter (HOSPITAL_BASED_OUTPATIENT_CLINIC_OR_DEPARTMENT_OTHER)
Admission: RE | Disposition: A | Discharge status: Home / Self Care | Payer: Self-pay | Source: Ambulatory Visit | Attending: Urology

## 2013-04-25 ENCOUNTER — Encounter (HOSPITAL_BASED_OUTPATIENT_CLINIC_OR_DEPARTMENT_OTHER): Payer: Medicare Other | Admitting: Certified Registered"

## 2013-04-25 DIAGNOSIS — Z7982 Long term (current) use of aspirin: Secondary | ICD-10-CM | POA: Insufficient documentation

## 2013-04-25 DIAGNOSIS — I4891 Unspecified atrial fibrillation: Secondary | ICD-10-CM | POA: Insufficient documentation

## 2013-04-25 DIAGNOSIS — Z79899 Other long term (current) drug therapy: Secondary | ICD-10-CM | POA: Insufficient documentation

## 2013-04-25 DIAGNOSIS — Z90710 Acquired absence of both cervix and uterus: Secondary | ICD-10-CM | POA: Insufficient documentation

## 2013-04-25 DIAGNOSIS — M810 Age-related osteoporosis without current pathological fracture: Secondary | ICD-10-CM | POA: Insufficient documentation

## 2013-04-25 DIAGNOSIS — I10 Essential (primary) hypertension: Secondary | ICD-10-CM | POA: Insufficient documentation

## 2013-04-25 DIAGNOSIS — D696 Thrombocytopenia, unspecified: Secondary | ICD-10-CM | POA: Insufficient documentation

## 2013-04-25 DIAGNOSIS — K219 Gastro-esophageal reflux disease without esophagitis: Secondary | ICD-10-CM | POA: Insufficient documentation

## 2013-04-25 DIAGNOSIS — E7800 Pure hypercholesterolemia, unspecified: Secondary | ICD-10-CM | POA: Insufficient documentation

## 2013-04-25 DIAGNOSIS — E559 Vitamin D deficiency, unspecified: Secondary | ICD-10-CM | POA: Insufficient documentation

## 2013-04-25 DIAGNOSIS — N201 Calculus of ureter: Secondary | ICD-10-CM | POA: Insufficient documentation

## 2013-04-25 DIAGNOSIS — E213 Hyperparathyroidism, unspecified: Secondary | ICD-10-CM | POA: Insufficient documentation

## 2013-04-25 DIAGNOSIS — N200 Calculus of kidney: Secondary | ICD-10-CM | POA: Insufficient documentation

## 2013-04-25 DIAGNOSIS — F411 Generalized anxiety disorder: Secondary | ICD-10-CM | POA: Insufficient documentation

## 2013-04-25 DIAGNOSIS — I8390 Asymptomatic varicose veins of unspecified lower extremity: Secondary | ICD-10-CM | POA: Insufficient documentation

## 2013-04-25 DIAGNOSIS — Z8672 Personal history of thrombophlebitis: Secondary | ICD-10-CM | POA: Insufficient documentation

## 2013-04-25 DIAGNOSIS — M109 Gout, unspecified: Secondary | ICD-10-CM | POA: Insufficient documentation

## 2013-04-25 HISTORY — DX: Permanent atrial fibrillation: I48.21

## 2013-04-25 HISTORY — DX: Calculus of ureter: N20.1

## 2013-04-25 HISTORY — DX: Presence of xenogenic heart valve: Z95.3

## 2013-04-25 HISTORY — DX: Immune thrombocytopenic purpura: D69.3

## 2013-04-25 HISTORY — DX: Hyperlipidemia, unspecified: E78.5

## 2013-04-25 HISTORY — DX: Other specified disorders of bone density and structure, unspecified site: M85.80

## 2013-04-25 HISTORY — PX: CYSTOSCOPY WITH URETEROSCOPY AND STENT PLACEMENT: SHX6377

## 2013-04-25 HISTORY — DX: Sick sinus syndrome: I49.5

## 2013-04-25 HISTORY — PX: HOLMIUM LASER APPLICATION: SHX5852

## 2013-04-25 HISTORY — DX: Gastro-esophageal reflux disease without esophagitis: K21.9

## 2013-04-25 HISTORY — DX: Essential tremor: G25.0

## 2013-04-25 HISTORY — DX: Hyperparathyroidism, unspecified: E21.3

## 2013-04-25 HISTORY — DX: Esophageal obstruction: K22.2

## 2013-04-25 HISTORY — DX: Presence of spectacles and contact lenses: Z97.3

## 2013-04-25 LAB — POCT I-STAT 4, (NA,K, GLUC, HGB,HCT)
GLUCOSE: 106 mg/dL — AB (ref 70–99)
HCT: 39 % (ref 36.0–46.0)
Hemoglobin: 13.3 g/dL (ref 12.0–15.0)
Potassium: 4.3 mEq/L (ref 3.7–5.3)
Sodium: 139 mEq/L (ref 137–147)

## 2013-04-25 SURGERY — CYSTOURETEROSCOPY, WITH STENT INSERTION
Anesthesia: General | Site: Ureter | Laterality: Right

## 2013-04-25 MED ORDER — SODIUM CHLORIDE 0.9 % IV SOLN
2.0000 g | Freq: Once | INTRAVENOUS | Status: AC
  Administered 2013-04-25: 2 g via INTRAVENOUS
  Filled 2013-04-25: qty 2000

## 2013-04-25 MED ORDER — SODIUM CHLORIDE 0.9 % IJ SOLN
3.0000 mL | Freq: Two times a day (BID) | INTRAMUSCULAR | Status: DC
  Filled 2013-04-25: qty 3

## 2013-04-25 MED ORDER — FENTANYL CITRATE 0.05 MG/ML IJ SOLN
25.0000 ug | INTRAMUSCULAR | Status: DC | PRN
  Filled 2013-04-25: qty 1

## 2013-04-25 MED ORDER — ACETAMINOPHEN 325 MG PO TABS
650.0000 mg | ORAL_TABLET | ORAL | Status: DC | PRN
Start: 2013-04-25 — End: 2013-04-26
  Filled 2013-04-25: qty 2

## 2013-04-25 MED ORDER — PHENAZOPYRIDINE HCL 200 MG PO TABS
200.0000 mg | ORAL_TABLET | Freq: Three times a day (TID) | ORAL | Status: DC | PRN
  Administered 2013-04-25: 200 mg via ORAL
  Filled 2013-04-25 (×3): qty 1

## 2013-04-25 MED ORDER — HYDROCODONE-ACETAMINOPHEN 5-325 MG PO TABS
1.0000 | ORAL_TABLET | Freq: Four times a day (QID) | ORAL | Status: DC | PRN
  Administered 2013-04-25: 1 via ORAL
  Filled 2013-04-25: qty 1

## 2013-04-25 MED ORDER — LACTATED RINGERS IV SOLN
INTRAVENOUS | Status: DC
  Administered 2013-04-25 (×2): via INTRAVENOUS
  Filled 2013-04-25: qty 1000

## 2013-04-25 MED ORDER — CIPROFLOXACIN IN D5W 400 MG/200ML IV SOLN
INTRAVENOUS | Status: AC
  Filled 2013-04-25: qty 200

## 2013-04-25 MED ORDER — OXYCODONE HCL 5 MG PO TABS
5.0000 mg | ORAL_TABLET | Freq: Once | ORAL | Status: DC | PRN
  Filled 2013-04-25: qty 1

## 2013-04-25 MED ORDER — CIPROFLOXACIN IN D5W 400 MG/200ML IV SOLN
400.0000 mg | INTRAVENOUS | Status: AC
Start: 2013-04-25 — End: 2013-04-25
  Administered 2013-04-25: 400 mg via INTRAVENOUS
  Filled 2013-04-25: qty 200

## 2013-04-25 MED ORDER — AMPICILLIN SODIUM 1 G IJ SOLR
INTRAMUSCULAR | Status: AC
  Filled 2013-04-25: qty 1000

## 2013-04-25 MED ORDER — PROMETHAZINE HCL 25 MG/ML IJ SOLN
6.2500 mg | INTRAMUSCULAR | Status: DC | PRN
  Filled 2013-04-25: qty 1

## 2013-04-25 MED ORDER — PHENAZOPYRIDINE HCL 200 MG PO TABS: 200.0000 mg | ORAL_TABLET | Freq: Three times a day (TID) | ORAL | Status: DC | PRN

## 2013-04-25 MED ORDER — OXYCODONE HCL 5 MG/5ML PO SOLN
5.0000 mg | Freq: Once | ORAL | Status: DC | PRN
Start: 2013-04-25 — End: 2013-04-26
  Filled 2013-04-25: qty 5

## 2013-04-25 MED ORDER — SODIUM CHLORIDE 0.9 % IJ SOLN
3.0000 mL | INTRAMUSCULAR | Status: DC | PRN
  Filled 2013-04-25: qty 3

## 2013-04-25 MED ORDER — OXYCODONE HCL 5 MG PO TABS
5.0000 mg | ORAL_TABLET | ORAL | Status: DC | PRN
  Filled 2013-04-25: qty 2

## 2013-04-25 MED ORDER — EPHEDRINE SULFATE 50 MG/ML IJ SOLN
INTRAMUSCULAR | Status: DC | PRN
  Administered 2013-04-25 (×3): 10 mg via INTRAVENOUS

## 2013-04-25 MED ORDER — LIDOCAINE HCL (CARDIAC) 20 MG/ML IV SOLN
INTRAVENOUS | Status: DC | PRN
  Administered 2013-04-25: 100 mg via INTRAVENOUS

## 2013-04-25 MED ORDER — PROPOFOL 10 MG/ML IV BOLUS
INTRAVENOUS | Status: DC | PRN
  Administered 2013-04-25: 30 mg via INTRAVENOUS
  Administered 2013-04-25: 120 mg via INTRAVENOUS
  Administered 2013-04-25: 50 mg via INTRAVENOUS

## 2013-04-25 MED ORDER — SODIUM CHLORIDE 0.9 % IV SOLN
250.0000 mL | INTRAVENOUS | Status: DC | PRN
  Filled 2013-04-25: qty 250

## 2013-04-25 MED ORDER — MEPERIDINE HCL 25 MG/ML IJ SOLN
6.2500 mg | INTRAMUSCULAR | Status: DC | PRN
  Filled 2013-04-25: qty 1

## 2013-04-25 MED ORDER — HYDROCODONE-ACETAMINOPHEN 5-325 MG PO TABS: 1.0000 | ORAL_TABLET | Freq: Four times a day (QID) | ORAL | Status: DC | PRN

## 2013-04-25 MED ORDER — FENTANYL CITRATE 0.05 MG/ML IJ SOLN
INTRAMUSCULAR | Status: DC | PRN
  Administered 2013-04-25: 25 ug via INTRAVENOUS

## 2013-04-25 MED ORDER — PHENYLEPHRINE HCL 10 MG/ML IJ SOLN
INTRAMUSCULAR | Status: DC | PRN
Start: 2013-04-25 — End: 2013-04-25
  Administered 2013-04-25: 50 ug via INTRAVENOUS

## 2013-04-25 MED ORDER — ONDANSETRON HCL 4 MG/2ML IJ SOLN
4.0000 mg | Freq: Four times a day (QID) | INTRAMUSCULAR | Status: DC | PRN
  Filled 2013-04-25: qty 2

## 2013-04-25 MED ORDER — DEXAMETHASONE SODIUM PHOSPHATE 4 MG/ML IJ SOLN
INTRAMUSCULAR | Status: DC | PRN
  Administered 2013-04-25: 4 mg via INTRAVENOUS

## 2013-04-25 MED ORDER — FENTANYL CITRATE 0.05 MG/ML IJ SOLN
INTRAMUSCULAR | Status: AC
Start: 2013-04-25 — End: 2013-04-25
  Filled 2013-04-25: qty 6

## 2013-04-25 MED ORDER — SODIUM CHLORIDE 0.9 % IR SOLN
Status: DC | PRN
  Administered 2013-04-25: 2000 mL

## 2013-04-25 MED ORDER — ACETAMINOPHEN 650 MG RE SUPP
650.0000 mg | RECTAL | Status: DC | PRN
  Filled 2013-04-25: qty 1

## 2013-04-25 MED ORDER — HYDROMORPHONE HCL PF 1 MG/ML IJ SOLN
0.2500 mg | INTRAMUSCULAR | Status: DC | PRN
  Filled 2013-04-25: qty 1

## 2013-04-25 MED ORDER — HYDROCODONE-ACETAMINOPHEN 5-325 MG PO TABS
ORAL_TABLET | ORAL | Status: AC
  Filled 2013-04-25: qty 1

## 2013-04-25 MED ORDER — PHENAZOPYRIDINE HCL 100 MG PO TABS
ORAL_TABLET | ORAL | Status: AC
  Filled 2013-04-25: qty 2

## 2013-04-25 SURGICAL SUPPLY — 43 items
BAG DRAIN URO-CYSTO SKYTR STRL (DRAIN) ×2 IMPLANT
BAG DRN UROCATH (DRAIN) ×1
BASKET LASER NITINOL 1.9FR (BASKET) IMPLANT
BASKET SEGURA 3FR (UROLOGICAL SUPPLIES) IMPLANT
BASKET STNLS GEMINI 4WIRE 3FR (BASKET) IMPLANT
BASKET ZERO TIP NITINOL 2.4FR (BASKET) ×1 IMPLANT
BSKT STON RTRVL 120 1.9FR (BASKET)
BSKT STON RTRVL GEM 120X11 3FR (BASKET)
BSKT STON RTRVL ZERO TP 2.4FR (BASKET) ×1
CANISTER SUCT LVC 12 LTR MEDI- (MISCELLANEOUS) ×1 IMPLANT
CATH URET 5FR 28IN CONE TIP (BALLOONS)
CATH URET 5FR 28IN OPEN ENDED (CATHETERS) IMPLANT
CATH URET 5FR 70CM CONE TIP (BALLOONS) IMPLANT
CLOTH BEACON ORANGE TIMEOUT ST (SAFETY) ×2 IMPLANT
DRAPE CAMERA CLOSED 9X96 (DRAPES) ×2 IMPLANT
ELECT REM PT RETURN 9FT ADLT (ELECTROSURGICAL)
ELECTRODE REM PT RTRN 9FT ADLT (ELECTROSURGICAL) IMPLANT
FIBER LASER FLEXIVA 1000 (UROLOGICAL SUPPLIES) IMPLANT
FIBER LASER FLEXIVA 200 (UROLOGICAL SUPPLIES) IMPLANT
FIBER LASER FLEXIVA 365 (UROLOGICAL SUPPLIES) IMPLANT
FIBER LASER FLEXIVA 550 (UROLOGICAL SUPPLIES) IMPLANT
GLOVE BIOGEL M STER SZ 6 (GLOVE) ×1 IMPLANT
GLOVE BIOGEL PI IND STRL 6.5 (GLOVE) IMPLANT
GLOVE BIOGEL PI INDICATOR 6.5 (GLOVE) ×2
GLOVE SURG SS PI 8.0 STRL IVOR (GLOVE) ×2 IMPLANT
GOWN STRL NON-REIN LRG LVL3 (GOWN DISPOSABLE) ×1 IMPLANT
GOWN STRL REIN XL XLG (GOWN DISPOSABLE) ×1 IMPLANT
GOWN STRL REUS W/TWL LRG LVL3 (GOWN DISPOSABLE) ×1 IMPLANT
GOWN STRL REUS W/TWL XL LVL3 (GOWN DISPOSABLE) ×1 IMPLANT
GUIDEWIRE 0.038 PTFE COATED (WIRE) IMPLANT
GUIDEWIRE ANG ZIPWIRE 038X150 (WIRE) IMPLANT
GUIDEWIRE STR DUAL SENSOR (WIRE) ×2 IMPLANT
IV NS 1000ML (IV SOLUTION) ×4
IV NS 1000ML BAXH (IV SOLUTION) IMPLANT
IV NS IRRIG 3000ML ARTHROMATIC (IV SOLUTION) ×2 IMPLANT
KIT BALLIN UROMAX 15FX10 (LABEL) IMPLANT
KIT BALLN UROMAX 15FX4 (MISCELLANEOUS) IMPLANT
KIT BALLN UROMAX 26 75X4 (MISCELLANEOUS)
PACK CYSTOSCOPY (CUSTOM PROCEDURE TRAY) ×2 IMPLANT
SET HIGH PRES BAL DIL (LABEL)
SHEATH ACCESS URETERAL 38CM (SHEATH) IMPLANT
SHEATH ACCESS URETERAL 54CM (SHEATH) IMPLANT
STENT 6X24 (STENTS) ×1 IMPLANT

## 2013-04-25 NOTE — Anesthesia Procedure Notes (Signed)
Procedure Name: LMA Insertion Date/Time: 04/25/2013 10:12 AM Performed by: Denna Haggard D Pre-anesthesia Checklist: Patient identified, Emergency Drugs available, Suction available and Patient being monitored Patient Re-evaluated:Patient Re-evaluated prior to inductionOxygen Delivery Method: Circle System Utilized Preoxygenation: Pre-oxygenation with 100% oxygen Intubation Type: IV induction Ventilation: Mask ventilation without difficulty LMA: LMA inserted LMA Size: 4.0 Number of attempts: 1 Airway Equipment and Method: bite block Placement Confirmation: positive ETCO2 Tube secured with: Tape Dental Injury: Teeth and Oropharynx as per pre-operative assessment

## 2013-04-25 NOTE — Discharge Instructions (Addendum)
Ureteral Stent Implantation, Care After °Refer to this sheet in the next few weeks. These instructions provide you with information on caring for yourself after your procedure. Your health care provider may also give you more specific instructions. Your treatment has been planned according to current medical practices, but problems sometimes occur. Call your health care provider if you have any problems or questions after your procedure. °WHAT TO EXPECT AFTER THE PROCEDURE °You should be back to normal activity within 48 hours after the procedure. Nausea and vomiting may occur and are commonly the result of anesthesia. °It is common to experience sharp pain in the back or lower abdomen and penis with voiding. This is caused by movement of the ends of the stent with the act of urinating. It usually goes away within minutes after you have stopped urinating. °HOME CARE INSTRUCTIONS °Make sure to drink plenty of fluids. You may have small amounts of bleeding, causing your urine to be red. This is normal. Certain movements may trigger pain or a feeling that you need to urinate. You may be given medications to prevent infection or bladder spasms. Be sure to take all medications as directed. Only take over-the-counter or prescription medicines for pain, discomfort, or fever as directed by your health care provider. Do not take aspirin, as this can make bleeding worse. °Your stent will be left in until the blockage is resolved. This may take 2 weeks or longer, depending on the reason for stent implantation. You may have an X-ray exam to make sure your ureter is open and that the stent has not moved out of position (migrated). The stent can be removed by your health care provider in the office. Medications may be given for comfort while the stent is being removed. Be sure to keep all follow-up appointments so your health care provider can check that you are healing properly. °SEEK MEDICAL CARE IF: °· You experience  increasing pain. °· Your pain medication is not working. °SEEK IMMEDIATE MEDICAL CARE IF: °· Your urine is dark red or has blood clots. °· You are leaking urine (incontinent). °· You have a fever, chills, feeling sick to your stomach (nausea), or vomiting. °· Your pain is not relieved by pain medication. °· The end of the stent comes out of the urethra. °· You are unable to urinate. °Document Released: 10/03/2012 Document Reviewed: 10/03/2012 °[employer]® Patient Information ©2014 [employer]. ° ° °Post Anesthesia Home Care Instructions ° °Activity: °Get plenty of rest for the remainder of the day. A responsible adult should stay with you for 24 hours following the procedure.  °For the next 24 hours, DO NOT: °-Drive a car °-Operate machinery °-Drink alcoholic beverages °-Take any medication unless instructed by your physician °-Make any legal decisions or sign important papers. ° °Meals: °Start with liquid foods such as gelatin or soup. Progress to regular foods as tolerated. Avoid greasy, spicy, heavy foods. If nausea and/or vomiting occur, drink only clear liquids until the nausea and/or vomiting subsides. Call your physician if vomiting continues. ° °Special Instructions/Symptoms: °Your throat may feel dry or sore from the anesthesia or the breathing tube placed in your throat during surgery. If this causes discomfort, gargle with warm salt water. The discomfort should disappear within 24 hours. ° ° ° °

## 2013-04-25 NOTE — Transfer of Care (Signed)
Immediate Anesthesia Transfer of Care Note  Patient: [name]  Procedure(s) Performed: Procedure(s) (LRB): RIGHT URETEROSCOPY WITH  STENT PLACEMENT (Right) HOLMIUM LASER APPLICATION (Right)  Patient Location: PACU  Anesthesia Type: General  Level of Consciousness: awake, oriented, sedated and patient cooperative  Airway & Oxygen Therapy: Patient Spontanous Breathing and Patient connected to face mask oxygen  Post-op Assessment: Report given to PACU RN and Post -op Vital signs reviewed and stable  Post vital signs: Reviewed and stable  Complications: No apparent anesthesia complications

## 2013-04-25 NOTE — Interval H&P Note (Signed)
History and Physical Interval Note:  04/25/2013 8:48 AM  [name]  has presented today for surgery, with the diagnosis of RIGHT UVJ STONE  The various methods of treatment have been discussed with the patient and family. After consideration of risks, benefits and other options for treatment, the patient has consented to  Procedure(s): RIGHT URETEROSCOPY WITH POSSIBLE STENT PLACEMENT (Right) HOLMIUM LASER APPLICATION (Right) as a surgical intervention .  The patient's history has been reviewed, patient examined, no change in status, stable for surgery.  I have reviewed the patient's chart and labs.  Questions were answered to the patient's satisfaction.     Monty Spicher J

## 2013-04-25 NOTE — Anesthesia Preprocedure Evaluation (Signed)
Anesthesia Evaluation  Patient identified by MRN, date of birth, ID band Patient awake    Reviewed: Allergy & Precautions, H&P , NPO status , Patient's Chart, lab work & pertinent test results, reviewed documented beta blocker date and time   Airway Mallampati: II TM Distance: >3 FB Neck ROM: Full    Dental no notable dental hx.    Pulmonary pneumonia -, resolved,  breath sounds clear to auscultation  Pulmonary exam normal       Cardiovascular hypertension, Pt. on medications and Pt. on home beta blockers + Peripheral Vascular Disease and +CHF + dysrhythmias Atrial Fibrillation + pacemaker + Valvular Problems/Murmurs MR Rhythm:Regular Rate:Normal  MITRAL VALVE REPLACEMENT 04/03/2009    Neuro/Psych negative neurological ROS  negative psych ROS   GI/Hepatic Neg liver ROS, hiatal hernia, GERD-  ,  Endo/Other  negative endocrine ROS  Renal/GU Renal InsufficiencyRenal disease     Musculoskeletal negative musculoskeletal ROS (+)   Abdominal   Peds  Hematology negative hematology ROS (+)   Anesthesia Other Findings   Reproductive/Obstetrics negative OB ROS                           Anesthesia Physical  Anesthesia Plan  ASA: III  Anesthesia Plan: General   Post-op Pain Management:    Induction: Intravenous  Airway Management Planned: LMA  Additional Equipment:   Intra-op Plan:   Post-operative Plan: Extubation in OR  Informed Consent: I have reviewed the patients History and Physical, chart, labs and discussed the procedure including the risks, benefits and alternatives for the proposed anesthesia with the patient or authorized representative who has indicated his/her understanding and acceptance.   Dental advisory given  Plan Discussed with: CRNA and Surgeon  Anesthesia Plan Comments:         Anesthesia Quick Evaluation

## 2013-04-25 NOTE — Anesthesia Postprocedure Evaluation (Signed)
Anesthesia Post Note  Patient: [name]  Procedure(s) Performed: Procedure(s) (LRB): RIGHT URETEROSCOPY WITH  STENT PLACEMENT (Right) HOLMIUM LASER APPLICATION (Right)  Anesthesia type: General  Patient location: PACU  Post pain: Pain level controlled  Post assessment: Post-op Vital signs reviewed  Last Vitals: BP 118/51  Pulse 78  Temp(Src) 36.3 C (Oral)  Resp 25  Wt 189 lb (85.73 kg)  SpO2 97%  Post vital signs: Reviewed  Level of consciousness: sedated  Complications: No apparent anesthesia complications

## 2013-04-25 NOTE — Brief Op Note (Signed)
04/25/2013  10:52 AM  PATIENT:  [name]  80 y.o. female  PRE-OPERATIVE DIAGNOSIS:  RIGHT UVJ STONE  POST-OPERATIVE DIAGNOSIS:  RIGHT UVJ STONE and right renal stone  PROCEDURE:  Procedure(s): RIGHT URETEROSCOPY WITH  STENT PLACEMENT (Right) HOLMIUM LASER APPLICATION (Right)  SURGEON:  Surgeon(s) and Role:    * Irine Seal, MD - Primary  PHYSICIAN ASSISTANT:   ASSISTANTS: none   ANESTHESIA:   general  EBL:  Total I/O In: 1000 [I.V.:1000] Out: -   BLOOD ADMINISTERED:none  DRAINS: 6x24 right JJ stent   LOCAL MEDICATIONS USED:  NONE  SPECIMEN:  Source of Specimen:  stone fragments from distal ureter and kidney.  DISPOSITION OF SPECIMEN:  to family  COUNTS:  YES  TOURNIQUET:  * No tourniquets in log *  DICTATION: .Other Dictation: Dictation Number [id number]  PLAN OF CARE: Discharge to home after PACU  PATIENT DISPOSITION:  PACU - hemodynamically stable.   Delay start of Pharmacological VTE agent (>24hrs) due to surgical blood loss or risk of bleeding: not applicable

## 2013-04-26 ENCOUNTER — Encounter (HOSPITAL_BASED_OUTPATIENT_CLINIC_OR_DEPARTMENT_OTHER): Payer: Self-pay | Admitting: Urology

## 2013-04-26 NOTE — Op Note (Signed)
[name], [name] NO.:  [id number]  MEDICAL RECORD NO.:  [id number]  LOCATION:                                 FACILITY:  PHYSICIAN:  Marshall Cork. Jeffie Pollock, M.D.    DATE OF BIRTH:  Mar 30, 1929  DATE OF PROCEDURE:  04/25/2013 DATE OF DISCHARGE:                              OPERATIVE REPORT   PROCEDURES:  Right ureteroscopic stone extraction of right renal stone, right ureteroscopic stone extraction with holmium lasertripsy of right distal ureteral stone, insertion of right double-J stent.  PREOPERATIVE DIAGNOSIS:  Right distal ureteral stone.  POSTOPERATIVE DIAGNOSIS:  Right distal ureteral stone with right renal stone.  SURGEON:  Marshall Cork. Jeffie Pollock, M.D.  ANESTHESIA:  General.  SPECIMEN:  Stone fragments.  DRAINS:  A 6-French, 24 right double-J stent.  COMPLICATIONS:  None.  INDICATIONS:  [name] is an 80 year old white female who had symptomatic right distal ureteral stone that measured approximately 6 mm, it was felt that ureteroscopy was indicated.  FINDINGS OF PROCEDURE:  She was taken to the operating room where she was given Cipro and ampicillin.  SBE prophylaxis had been requested by Cardiology.  She was given a general anesthetic and placed in lithotomy position and fitted with PAS hose.  Her perineum and genitalia were prepped with Betadine solution.  She was draped in usual sterile fashion.  Cystoscopy was performed using a 22-French scope and 12-degree lens. Examination revealed a normal urethra.  The ureteral orifices were unremarkable.  The bladder wall was smooth and pale without tumor, stones, or inflammation.  The right ureteral orifice was cannulated with a 6.5-French short ureteroscope, which was advanced easily into the ureter.  The distal stone was visualized, but it flushed proximally and I was unable to get a basket bag to retrieve it before flushed back into the kidney.  At this point, a guidewire was placed through the  ureteroscope and a 38- cm 1214 digital access sheath was advanced over the wire to the kidney. The wire and inner core were removed.  There was some bleeding initially upon removal of the core and wire.  The Olympus digital flexible ureteroscope was then passed through the access sheath.  The stone could be visualized fluoroscopically, which aided identification.  Initially, a stone was seen and a small clot, and this was removed with the basket, however on fluoroscopy, the primary stone remained in position.  At this point, the scope was reinserted and the stone that had been in the distal ureter was engaged with the basket and pulled to the top of the sheath, but could not be removed through the sheath intact.  At this point, the 200 micron laser fiber was passed through the ureteroscope.  The stone was engaged at a power of 0.5 joules and a frequency of 10 Hz.  The stone was broken into manageable fragments, which were then removed by flushing and basket extraction.  The flexible scope was then reinserted to the kidney.  A guidewire was placed through the scope into the kidney.  The scope and access sheath were removed, and the cystoscope was backloaded over the wire.  A 6-French 24 cm double-J stent without  string was then inserted to the kidney under fluoroscopic guidance.  The wire was removed leaving the stone in good position.  The bladder was then drained.  The cystoscope was removed.  The patient was taken down from lithotomy position.  Her anesthetic was reversed.  She was moved to recovery in stable condition. There were no complications.  The stone fragments were given to the family to bring to the office for evaluation.     Marshall Cork. Jeffie Pollock, M.D.     JJW/MEDQ  D:  04/25/2013  T:  04/26/2013  Job:  [id number]

## 2013-04-29 ENCOUNTER — Encounter: Payer: Self-pay | Admitting: Cardiovascular Disease

## 2013-05-09 ENCOUNTER — Ambulatory Visit: Payer: Medicare Other | Admitting: Hematology & Oncology

## 2013-05-09 ENCOUNTER — Other Ambulatory Visit: Payer: Medicare Other | Admitting: Lab

## 2013-05-29 ENCOUNTER — Other Ambulatory Visit (INDEPENDENT_AMBULATORY_CARE_PROVIDER_SITE_OTHER): Payer: Medicare Other

## 2013-05-29 ENCOUNTER — Other Ambulatory Visit: Payer: Self-pay | Admitting: *Deleted

## 2013-05-29 ENCOUNTER — Encounter: Payer: Self-pay | Admitting: Endocrinology

## 2013-05-29 ENCOUNTER — Ambulatory Visit (INDEPENDENT_AMBULATORY_CARE_PROVIDER_SITE_OTHER): Payer: Medicare Other | Admitting: Endocrinology

## 2013-05-29 DIAGNOSIS — I10 Essential (primary) hypertension: Secondary | ICD-10-CM

## 2013-05-29 DIAGNOSIS — N200 Calculus of kidney: Secondary | ICD-10-CM

## 2013-05-29 DIAGNOSIS — E039 Hypothyroidism, unspecified: Secondary | ICD-10-CM | POA: Insufficient documentation

## 2013-05-29 DIAGNOSIS — M810 Age-related osteoporosis without current pathological fracture: Secondary | ICD-10-CM

## 2013-05-29 LAB — RENAL FUNCTION PANEL
Albumin: 3.6 g/dL (ref 3.5–5.2)
BUN: 17 mg/dL (ref 6–23)
CHLORIDE: 108 meq/L (ref 96–112)
CO2: 30 meq/L (ref 19–32)
Calcium: 10.5 mg/dL (ref 8.4–10.5)
Creatinine, Ser: 1.1 mg/dL (ref 0.4–1.2)
GFR: 51.11 mL/min — ABNORMAL LOW (ref >60.00)
Glucose, Bld: 91 mg/dL (ref 70–99)
Phosphorus: 2.3 mg/dL (ref 2.3–4.6)
Potassium: 4.4 mEq/L (ref 3.5–5.1)
Sodium: 141 mEq/L (ref 135–145)

## 2013-05-29 NOTE — Progress Notes (Signed)
Patient ID: [name], female   DOB: 1929/11/26, 80 y.o.   MRN: [id number]  Reason for Appointment:  Hyperparathyroidism, followup visit    History of Present Illness:   She has had hypercalcemia since about 2010 She also has had history of a ureteral stone and osteoporosis Her T score was -2.8 at the hip on the last measurement in 12/13 and has been stable since 2010 Kidney stones apparently recurred in 3/15. Not clear if she has had stone chemistry done Because of her age and mild hypercalcemia she was not referred for surgery and has been treated with Fosamax for prevention of osteoporosis and fracture She has been taking Fosamax weekly without any side effects since 02/2012 Vitamin D level has been normal with supplementation  She feels well and has had no recent medical issues except kidney stones No weight loss or change in appetite  Lab Results  Component Value Date   CALCIUM 10.5 01/04/2013   CALCIUM 10.4 11/28/2012   CALCIUM 10.4 11/28/2012   CALCIUM 10.8* 08/10/2012        Medication List       This list is accurate as of: 05/29/13  1:52 PM.  Always use your most recent med list.               alendronate 70 MG tablet  Commonly known as:  FOSAMAX  Take 70 mg by mouth every 7 (seven) days. Take with a full glass of water on an empty stomach. Friday     ALPRAZolam 0.5 MG tablet  Commonly known as:  XANAX  Take 0.5 mg by mouth at bedtime.     aspirin EC 81 MG tablet  Take 81 mg by mouth every morning.     AZOR 5-20 MG per tablet  Generic drug:  amLODipine-olmesartan  TAKE 1 TABLET EVERY DAY---  takes in am     cholecalciferol 1000 UNITS tablet  Commonly known as:  VITAMIN D  Take 1,000 Units by mouth 2 (two) times daily. 2 tablets in the am and one in the pm     furosemide 40 MG tablet  Commonly known as:  LASIX  Take 40 mg by mouth every morning.     HYDROcodone-acetaminophen 5-325 MG per tablet  Commonly known as:  NORCO  Take 1 tablet by mouth  every 6 (six) hours as needed for moderate pain.     metoprolol tartrate 25 MG tablet  Commonly known as:  LOPRESSOR  Take 12.5 mg by mouth 2 (two) times daily.     multivitamin with minerals Tabs tablet  Take 1 tablet by mouth every morning.     omeprazole 20 MG capsule  Commonly known as:  PRILOSEC  Take 20 mg by mouth every morning.     phenazopyridine 200 MG tablet  Commonly known as:  PYRIDIUM  Take 1 tablet (200 mg total) by mouth 3 (three) times daily as needed for pain.     potassium chloride SA 20 MEQ tablet  Commonly known as:  K-DUR,KLOR-CON  Take 20 mEq by mouth daily after supper.     simvastatin 20 MG tablet  Commonly known as:  ZOCOR  Take 10 mg by mouth daily after supper.     VITAMIN B COMPLEX PO  Take 1 tablet by mouth every morning.        Past Medical History  Diagnosis Date  . CHF (congestive heart failure)   . Edema of both legs 04-23-2013  pt states feet swelling (  wears ted hose)    bilateral venous ablation procedures done in IR, L>R  . Hypertension   . Venous insufficiency, peripheral   . H/O hiatal hernia   . Anxiety   . Depression   . Arthritis   . Permanent atrial fibrillation     CARDIOLOGIST-- DR XNATFTDD  . Sick sinus syndrome     S/P  PACEMAKER  2009  . S/P mitral valve replacement with bioprosthetic valve     04-03-2009  . S/P cardiac pacemaker procedure     LAST PACER CHECK  04-23-2013  IN [facility]  . History of kidney stones   . Right ureteral stone   . Thrombocytopenia, immune     CHRONIC---  HEMOTOLOGIST---  DR ENNEVER  . Osteopenia   . Benign essential tremor   . Schatzki's ring   . Hyperparathyroidism     MILD--  STABLE  . Hyperlipidemia   . GERD (gastroesophageal reflux disease)   . History of basal cell carcinoma excision     [place]  . Wears glasses     Past Surgical History  Procedure Laterality Date  . Hand surgery Right 05/2011  . Permanent pacemaker insertion  02/26/2007    Dual Chamber [employer]  Programmed VVIR--- Generator ADDR01/  #[id number] H   . Cystoscopy w/ ureteral stent placement Left 08/01/2012    Procedure: CYSTOSCOPY WITH RETROGRADE PYELOGRAM/URETERAL STENT PLACEMENT;  Surgeon: Dutch Gray, MD;  Location: [facility];  Service: Urology;  Laterality: Left;  . Cystoscopy with ureteroscopy Left 08/14/2012    Procedure: LEFT URETEROSCOPY WITH HOLMIUM LASER AND LEFT  STENT PLACEMENT;  Surgeon: Malka So, MD;  Location: [facility];  Service: Urology;  Laterality: Left;  . Holmium laser application Left 07/16/3760    Procedure:  HOLMIUM LASER APPLICATION;  Surgeon: Malka So, MD;  Location: [facility];  Service: Urology;  Laterality: Left;  . Nm myocar perf wall motion  03/01/1999    mild lateral ischemia  . Mitral valve replacement with chordal presevation using bioprosthetic valve/  ligation left atrial appendage/  left-sided maze procedure  04-03-2009  DR Ethel Rana,  SERIAL # [id number]  . Transthoracic echocardiogram  09-07-2012  DR CROITORU    MILD LVH/  EF 55-60%/  MILD TO MODERATE AR/   SEVERE LAE  &  RAE/  MILD TO MODERATE TR/   MITRAL VALVE BIOPROSTHESIS LEAFLET NORMAL,  MODERATE STENOSIS WITH NO SIG. REGURG/  MODERATE RVE  . Cardiac catheterization  03/27/2009    normal coronaries; grade IV Mitral Insuff.  . Cardiac catheterization  2001  &  2006  . Vaginal hysterectomy  1982  . Extracorporeal shock wave lithotripsy Left 07-26-2012  . Cystoscopy with ureteroscopy and stent placement Right 04/25/2013    Procedure: RIGHT URETEROSCOPY WITH  STENT PLACEMENT;  Surgeon: Irine Seal, MD;  Location: [facility];  Service: Urology;  Laterality: Right;  . Holmium laser application Right 0/73/7106    Procedure: HOLMIUM LASER APPLICATION;  Surgeon: Irine Seal, MD;  Location: [facility];  Service: Urology;  Laterality: Right;    Family History  Problem Relation Age of Onset  . Cancer Mother   . Heart attack Father     Social History:  reports that she  has never smoked. She has never used smokeless tobacco. She reports that she does not drink alcohol or use illicit drugs.  Allergies: No Known Allergies  REVIEW of SYSTEMS:  She has had history of hypercholesterolemia and hypertension  Takes Lasix daily for history of edema, no history of CHF  No history of hypothyroidism     Examination:   BP 138/86  Pulse 74  Temp(Src) 98.3 F (36.8 C)  Resp 16  Ht 5\' 5"  (1.651 m)  Wt 186 lb 6.4 oz (84.55 kg)  BMI 31.02 kg/m2  SpO2 95%   GENERAL APPEARANCE: Alert And looks well.             NECK: no thyromegaly or adenopathy            Assessments   Hyperparathyroidism, mild and stable Associated stable osteoporosis Recurrent nephrolithiasis, probably related to hypercalciuria   Treatment: Check calcium level today 24 urine calcium and creatinine, instructed on how to do 24 urine collection Consider HCTZ if she has excessive urinary calcium and may need to watch serum calcium with this; also may need to reduce her Lasix using HCTZ concomitantly She is very reluctant to consider parathyroid surgery and she is not a good candidate at her age Discussed with patient in detail Continue taking Fosamax Consider followup bone density later this year She will also continue taking low dose vitamin D  Elayne Snare 05/29/2013, 1:52 PM   Addendum: Labs as follows, calcium stable  Appointment on 05/29/2013  Component Date Value Ref Range Status  . Sodium 05/29/2013 141  135 - 145 mEq/L Final  . Potassium 05/29/2013 4.4  3.5 - 5.1 mEq/L Final  . Chloride 05/29/2013 108  96 - 112 mEq/L Final  . CO2 05/29/2013 30  19 - 32 mEq/L Final  . Calcium 05/29/2013 10.5  8.4 - 10.5 mg/dL Final  . Albumin 05/29/2013 3.6  3.5 - 5.2 g/dL Final  . BUN 05/29/2013 17  6 - 23 mg/dL Final  . Creatinine, Ser 05/29/2013 1.1  0.4 - 1.2 mg/dL Final  . Glucose, Bld 05/29/2013 91  70 - 99 mg/dL Final  . Phosphorus 05/29/2013 2.3  2.3 - 4.6 mg/dL Final  . GFR  05/29/2013 51.11* >60.00 mL/min Final

## 2013-05-30 NOTE — Progress Notes (Signed)
Quick Note:  Please let patient know that the calcium result is normal ______

## 2013-06-03 ENCOUNTER — Other Ambulatory Visit: Payer: Self-pay | Admitting: Gastroenterology

## 2013-06-05 ENCOUNTER — Other Ambulatory Visit: Payer: Self-pay | Admitting: Nurse Practitioner

## 2013-06-05 ENCOUNTER — Other Ambulatory Visit (HOSPITAL_BASED_OUTPATIENT_CLINIC_OR_DEPARTMENT_OTHER): Payer: Medicare Other | Admitting: Lab

## 2013-06-05 ENCOUNTER — Ambulatory Visit (HOSPITAL_BASED_OUTPATIENT_CLINIC_OR_DEPARTMENT_OTHER): Payer: Medicare Other | Admitting: Hematology & Oncology

## 2013-06-05 ENCOUNTER — Encounter: Payer: Self-pay | Admitting: Hematology & Oncology

## 2013-06-05 VITALS — BP 133/62 | HR 71 | Temp 97.9°F | Resp 14 | Ht 65.0 in | Wt 188.0 lb

## 2013-06-05 DIAGNOSIS — Z7982 Long term (current) use of aspirin: Secondary | ICD-10-CM

## 2013-06-05 DIAGNOSIS — D696 Thrombocytopenia, unspecified: Secondary | ICD-10-CM

## 2013-06-05 LAB — CBC WITH DIFFERENTIAL (CANCER CENTER ONLY)
BASO#: 0.1 10*3/uL (ref 0.0–0.2)
BASO%: 1.3 % (ref 0.0–2.0)
EOS%: 6 % (ref 0.0–7.0)
Eosinophils Absolute: 0.3 10*3/uL (ref 0.0–0.5)
HEMATOCRIT: 34.7 % — AB (ref 34.8–46.6)
HEMOGLOBIN: 11.1 g/dL — AB (ref 11.6–15.9)
LYMPH#: 0.9 10*3/uL (ref 0.9–3.3)
LYMPH%: 20.5 % (ref 14.0–48.0)
MCH: 30.3 pg (ref 26.0–34.0)
MCHC: 32 g/dL (ref 32.0–36.0)
MCV: 95 fL (ref 81–101)
MONO#: 0.6 10*3/uL (ref 0.1–0.9)
MONO%: 13.2 % — AB (ref 0.0–13.0)
NEUT#: 2.7 10*3/uL (ref 1.5–6.5)
NEUT%: 59 % (ref 39.6–80.0)
Platelets: 93 10*3/uL — ABNORMAL LOW (ref 145–400)
RBC: 3.66 10*6/uL — ABNORMAL LOW (ref 3.70–5.32)
RDW: 15.7 % (ref 11.1–15.7)
WBC: 4.5 10*3/uL (ref 3.9–10.0)

## 2013-06-05 LAB — CHCC SATELLITE - SMEAR

## 2013-06-05 NOTE — Progress Notes (Signed)
  DIAGNOSIS:  Chronic thrombocytopenia   CURRENT THERAPY:  Observation   INTERIM HISTORY:  Is back for followup. She is doing okay. She a colonoscopy or this week. She did have some slight bleeding with the IV that was put in. I told her that this is not from the thrombocytopenia. I told her  that this was from the aspirin that she takes and the fact that given her age that her blood vessels are somewhat fragile. I told her take vitamin C 500 mg a day.    She's had no bleeding otherwise. She's had a good appetite. She's had no nausea vomiting.   PHYSICAL EXAMINATION:  [place] well-nourished white female. Vital signs stable. Lungs are clear. Cardiac exam regular in rhythm. Abdomen soft. She has no palpable liver or spleen tip. Skin exam shows no rashes. She has a couple ecchymoses on her hand.    Her bowel sounds. Her to weight is 188 pounds. Blood pressure 133/62. Pulse 71.   LABORATORY STUDIES:  Platelet count is  93. Hemoglobin 11.1. White cell count is 7.4.   IMPRESSION:  Ms. [name] is a 80 year old female. His chronic thrombocytopenia  Her platelet count is stable. It's 93 today.    There is nothing that we need to do for her. There is no intervention that we have to do.    I keep reassured her.    I will see her back in 6 more months.   Volanda Napoleon, MD 06/05/2013

## 2013-06-10 ENCOUNTER — Telehealth: Payer: Self-pay | Admitting: *Deleted

## 2013-06-10 MED ORDER — AMLODIPINE-OLMESARTAN 5-20 MG PO TABS: 1.0000 | ORAL_TABLET | Freq: Every day | ORAL | Status: DC

## 2013-06-10 NOTE — Telephone Encounter (Signed)
Walk-In  Pt requesting refill on Azor.  Stated her pharmacy requested it twice last week and hasn't heard anything back.  Pt informed refill request not received, but RN will send now.  Refill(s) sent to pharmacy.  Pt verbalized understanding and agreed w/ plan.

## 2013-06-12 ENCOUNTER — Other Ambulatory Visit: Payer: Medicare Other

## 2013-06-12 ENCOUNTER — Other Ambulatory Visit: Payer: Self-pay | Admitting: *Deleted

## 2013-06-12 DIAGNOSIS — N179 Acute kidney failure, unspecified: Secondary | ICD-10-CM

## 2013-06-14 LAB — CREATININE, URINE, 24 HOUR
Creatinine, 24H Ur: 599.3 mg/24 hr — ABNORMAL LOW (ref 800.0–1800.0)
Creatinine, Ur: 46.1 mg/dL (ref 15.0–278.0)

## 2013-06-14 LAB — CALCIUM, URINE, 24 HOUR
CALCIUM 24H UR: 169.2 mg/(24.h) (ref 100.0–300.0)
Calcium, Ur: 14.1 mg/dL

## 2013-06-28 ENCOUNTER — Telehealth: Payer: Self-pay | Admitting: *Deleted

## 2013-06-28 NOTE — Telephone Encounter (Signed)
Patient is requesting the results of her 24 hour urine test.

## 2013-06-28 NOTE — Telephone Encounter (Signed)
Patient says she did collect it for 24 hours

## 2013-06-28 NOTE — Telephone Encounter (Signed)
Urine calcium was Not high, however appears that she may not have collected a full 24-hour collection.

## 2013-07-18 ENCOUNTER — Ambulatory Visit (INDEPENDENT_AMBULATORY_CARE_PROVIDER_SITE_OTHER): Payer: Medicare Other | Admitting: *Deleted

## 2013-07-18 DIAGNOSIS — I4891 Unspecified atrial fibrillation: Secondary | ICD-10-CM

## 2013-07-18 DIAGNOSIS — I4821 Permanent atrial fibrillation: Secondary | ICD-10-CM

## 2013-07-19 LAB — MDC_IDC_ENUM_SESS_TYPE_INCLINIC
Battery Impedance: 1324 Ohm
Brady Statistic RV Percent Paced: 45 %
Date Time Interrogation Session: 20150604172821
Lead Channel Impedance Value: 506 Ohm
Lead Channel Impedance Value: 67 Ohm
Lead Channel Pacing Threshold Amplitude: 0.75 V
Lead Channel Setting Pacing Amplitude: 2.5 V
Lead Channel Setting Pacing Pulse Width: 0.4 ms
Lead Channel Setting Sensing Sensitivity: 2.8 mV
MDC IDC MSMT BATTERY REMAINING LONGEVITY: 56 mo
MDC IDC MSMT BATTERY VOLTAGE: 2.77 V
MDC IDC MSMT LEADCHNL RV PACING THRESHOLD PULSEWIDTH: 0.4 ms
MDC IDC MSMT LEADCHNL RV SENSING INTR AMPL: 8 mV

## 2013-07-19 NOTE — Progress Notes (Signed)
Pacemaker check in clinic. Normal device function. Threshold, sensing, and impedance consistent with previous measurements. Device programmed to maximize longevity. Permanent AF+ASA. 7 high ventricular rates noted---max dur. 36 sec, Max V 163, Max Avg V 150, last 5-13. Device programmed at appropriate safety margins. Histogram distribution appropriate for patient activity level. Device programmed to optimize intrinsic conduction. Estimated longevity 4.5 years. Patient will follow up with the [facility] in 3 months.

## 2013-08-13 ENCOUNTER — Telehealth: Payer: Self-pay | Admitting: Cardiovascular Disease

## 2013-08-13 NOTE — Telephone Encounter (Signed)
Is asking that a nurse give her a call . Was told to call back on Friday and we will not be here on Friday and she wants to to know what to do . Please  Call  Thanks

## 2013-08-13 NOTE — Telephone Encounter (Signed)
DR BERRY SPOKE TO R MCNEIL. INSTRUCTION GIVEN BY DR Gwenlyn Found To Curt Bears -- appointment with Dr Sallyanne Kuster in near further

## 2013-08-13 NOTE — Telephone Encounter (Signed)
Seen by her PCP today and told she has pulmonary edema.  Instructed to increase furosemide to twice a day x 3 days and make an appt with Dr. Loletha Grayer. Patient also instructed to weigh daily.  Their clinic will be open over the weekend and she will call if she has any problems.

## 2013-08-14 ENCOUNTER — Other Ambulatory Visit: Payer: Self-pay | Admitting: Endocrinology

## 2013-08-19 ENCOUNTER — Encounter: Payer: Self-pay | Admitting: Cardiovascular Disease

## 2013-08-20 ENCOUNTER — Ambulatory Visit (INDEPENDENT_AMBULATORY_CARE_PROVIDER_SITE_OTHER): Payer: Medicare Other | Admitting: Cardiology

## 2013-08-20 ENCOUNTER — Encounter: Payer: Self-pay | Admitting: Cardiology

## 2013-08-20 ENCOUNTER — Encounter: Payer: Self-pay | Admitting: Cardiovascular Disease

## 2013-08-20 VITALS — BP 140/70 | HR 75 | Ht 65.0 in | Wt 187.0 lb

## 2013-08-20 DIAGNOSIS — Z952 Presence of prosthetic heart valve: Secondary | ICD-10-CM

## 2013-08-20 DIAGNOSIS — Z953 Presence of xenogenic heart valve: Secondary | ICD-10-CM

## 2013-08-20 DIAGNOSIS — D696 Thrombocytopenia, unspecified: Secondary | ICD-10-CM

## 2013-08-20 DIAGNOSIS — I10 Essential (primary) hypertension: Secondary | ICD-10-CM

## 2013-08-20 DIAGNOSIS — I4891 Unspecified atrial fibrillation: Secondary | ICD-10-CM

## 2013-08-20 DIAGNOSIS — I4821 Permanent atrial fibrillation: Secondary | ICD-10-CM

## 2013-08-20 DIAGNOSIS — R609 Edema, unspecified: Secondary | ICD-10-CM

## 2013-08-20 DIAGNOSIS — R600 Localized edema: Secondary | ICD-10-CM

## 2013-08-20 NOTE — Assessment & Plan Note (Signed)
Followed by Dr. Ennever 

## 2013-08-20 NOTE — Assessment & Plan Note (Addendum)
Diagnosed with heart failure earlier this week currently no shortness of breath the lungs are clear. Continues with lower extremity edema combination of salt intake and varicosities. Her last echo was 2014 and was stable. We'll increase her Lasix 40 mg twice a day for 4 days then back to 40 mg daily we'll also increase her potassium same time. Labs were reviewed today.  She will call as of no improvement of her edema otherwise she'll see Dr. Jerilynn Mages. Croitoru in 2 months.

## 2013-08-20 NOTE — Assessment & Plan Note (Signed)
Atrophic and with some ventricular pacing. No tachycardia all rate controlled

## 2013-08-20 NOTE — Assessment & Plan Note (Signed)
Last echo July 2014 EF was 55-60% she did have moderate mitral stenosis of her bioprosthesis. She also had mild to moderate aortic regurgitation.

## 2013-08-20 NOTE — Assessment & Plan Note (Signed)
Stable

## 2013-08-20 NOTE — Patient Instructions (Addendum)
1. Your physician recommends that you schedule a follow-up appointment in: 2 months with Dr. Sallyanne Kuster  2. Increase your potassium to 40 meq for four days then back to 20 meq daily  3. Increase your lasix to 40 mg two times a day for four days then back to 40 mg daily

## 2013-08-20 NOTE — Progress Notes (Signed)
08/20/2013   PCP:  Melinda Crutch, MD   Chief Complaint  Patient presents with  . ROV 4 months    C/o leg pain and edema.    Primary Cardiologist:Dr. Bertrum Sol   HPI:80 year old woman with a history of mitral valve replacement (29 mm Biocor, Dr. Prescott Gum, 2011), permanent atrial fibrillation status post pacemaker implantation (dual chamber Medtronic device programmed VVIR), status post atrial appendage ligation, recurrent nephrolithiasis, hypertension and hyperlipidemia. Cardiac catheterization in 2011 showed normal coronary arteries.   She presents today for lower extremity edema and congestive heart failure. She was seen by her primary care and diagnosed with heart failure, her Lasix was increased to 40 mg twice a day for 3 days and then back to regular dosing. Her weight at that time was up 14 pounds. Currently her weight is more like it was when she was last seen in the office though she continues with some lower extremity edema.  She denies chest pain and she denies shortness of breath today.  Her legs are too large to put on her support stockings currently.  She continues in atrial fibrillation at times with ventricular pacing.  Last echo July 2014 EF 55-60% she did have septal motion Paradox. Mild to moderate aortic regurgitation. Moderate mitral stenosis of her bioprosthesis.    No Known Allergies  Current Outpatient Prescriptions  Medication Sig Dispense Refill  . alendronate (FOSAMAX) 70 MG tablet Take 70 mg by mouth every 7 (seven) days. Take with a full glass of water on an empty stomach. Friday      . alendronate (FOSAMAX) 70 MG tablet TAKE 1 TABLET EVERY 7 DAYS WITH A FULL GLASS OF WATER ON AN EMPTY STOMACH  4 tablet  11  . ALPRAZolam (XANAX) 0.5 MG tablet Take 0.5 mg by mouth at bedtime.      Marland Kitchen amLODipine-olmesartan (AZOR) 5-20 MG per tablet Take 1 tablet by mouth daily.  30 tablet  10  . aspirin EC 81 MG tablet Take 81 mg by mouth every morning.      . B  Complex Vitamins (VITAMIN B COMPLEX PO) Take 1 tablet by mouth every morning.       . cholecalciferol (VITAMIN D) 1000 UNITS tablet Take 1,000 Units by mouth 2 (two) times daily. 2 tablets in the am and one in the pm      . furosemide (LASIX) 40 MG tablet Take 40 mg by mouth every morning.       Marland Kitchen HYDROcodone-acetaminophen (NORCO) 5-325 MG per tablet Take 1 tablet by mouth every 6 (six) hours as needed for moderate pain.  30 tablet  0  . metoprolol tartrate (LOPRESSOR) 25 MG tablet Take 12.5 mg by mouth 2 (two) times daily.       . Multiple Vitamin (MULTIVITAMIN WITH MINERALS) TABS Take 1 tablet by mouth every morning.      Marland Kitchen omeprazole (PRILOSEC) 20 MG capsule Take 20 mg by mouth every morning.       . potassium chloride SA (K-DUR,KLOR-CON) 20 MEQ tablet Take 20 mEq by mouth daily after supper.       . simvastatin (ZOCOR) 20 MG tablet Take 10 mg by mouth daily after supper.        No current facility-administered medications for this visit.    Past Medical History  Diagnosis Date  . CHF (congestive heart failure)   . Edema of both legs 04-23-2013  pt states feet swelling (wears ted  hose)    bilateral venous ablation procedures done in IR, L>R  . Hypertension   . Venous insufficiency, peripheral   . H/O hiatal hernia   . Anxiety   . Depression   . Arthritis   . Permanent atrial fibrillation     CARDIOLOGIST-- DR [id number]  . Sick sinus syndrome     S/P  PACEMAKER  2009  . S/P mitral valve replacement with bioprosthetic valve     04-03-2009  . S/P cardiac pacemaker procedure     LAST PACER CHECK  04-23-2013  IN [facility]  . History of kidney stones   . Right ureteral stone   . Thrombocytopenia, immune     CHRONIC---  HEMOTOLOGIST---  DR ENNEVER  . Osteopenia   . Benign essential tremor   . Schatzki's ring   . Hyperparathyroidism     MILD--  STABLE  . Hyperlipidemia   . GERD (gastroesophageal reflux disease)   . History of basal cell carcinoma excision     foredhead  . Wears  glasses     Past Surgical History  Procedure Laterality Date  . Hand surgery Right 05/2011  . Permanent pacemaker insertion  02/26/2007    Dual Chamber Medtronic Programmed VVIR--- Generator [id number]/  #[id number] H   . Cystoscopy w/ ureteral stent placement Left 08/01/2012    Procedure: CYSTOSCOPY WITH RETROGRADE PYELOGRAM/URETERAL STENT PLACEMENT;  Surgeon: Dutch Gray, MD;  Location: [facility];  Service: Urology;  Laterality: Left;  . Cystoscopy with ureteroscopy Left 08/14/2012    Procedure: LEFT URETEROSCOPY WITH HOLMIUM LASER AND LEFT  STENT PLACEMENT;  Surgeon: Malka So, MD;  Location: [facility];  Service: Urology;  Laterality: Left;  . Holmium laser application Left 03/20/2681    Procedure:  HOLMIUM LASER APPLICATION;  Surgeon: Malka So, MD;  Location: [facility];  Service: Urology;  Laterality: Left;  . Nm myocar perf wall motion  03/01/1999    mild lateral ischemia  . Mitral valve replacement with chordal presevation using bioprosthetic valve/  ligation left atrial appendage/  left-sided maze procedure  04-03-2009  DR Ethel Rana,  SERIAL # [id number]  . Transthoracic echocardiogram  09-07-2012  DR CROITORU    MILD LVH/  EF 55-60%/  MILD TO MODERATE AR/   SEVERE LAE  &  RAE/  MILD TO MODERATE TR/   MITRAL VALVE BIOPROSTHESIS LEAFLET NORMAL,  MODERATE STENOSIS WITH NO SIG. REGURG/  MODERATE RVE  . Cardiac catheterization  03/27/2009    normal coronaries; grade IV Mitral Insuff.  . Cardiac catheterization  2001  &  2006  . Vaginal hysterectomy  1982  . Extracorporeal shock wave lithotripsy Left 07-26-2012  . Cystoscopy with ureteroscopy and stent placement Right 04/25/2013    Procedure: RIGHT URETEROSCOPY WITH  STENT PLACEMENT;  Surgeon: Irine Seal, MD;  Location: [facility];  Service: Urology;  Laterality: Right;  . Holmium laser application Right 9/89/2119    Procedure: HOLMIUM LASER APPLICATION;  Surgeon: Irine Seal, MD;  Location: [facility];  Service:  Urology;  Laterality: Right;    ERD:EYCXKGY:JE colds or fevers, no weight changes Skin:no rashes or ulcers HEENT:no blurred vision, no congestion CV:see HPI PUL:see HPI GI:no diarrhea constipation or melena, no indigestion GU:no hematuria, no dysuria MS:no joint pain, no claudication Neuro:no syncope, no lightheadedness Endo:no diabetes, no thyroid disease Diet:  She admits to increasing salt intake  Wt Readings from Last 3 Encounters:  08/20/13 187 lb (84.823 kg)  06/05/13 188  lb (85.276 kg)  05/29/13 186 lb 6.4 oz (84.55 kg)    PHYSICAL EXAM BP 140/70  Pulse 75  Ht 5\' 5"  (1.651 m)  Wt 187 lb (84.823 kg)  BMI 31.12 kg/m2 General:Pleasant affect, NAD Skin:Warm and dry, brisk capillary refill HEENT:normocephalic, sclera clear, mucus membranes moist Neck:supple, no JVD, no bruits, no adenopathy  Heart:S1S2 RRR to slightly irreg without murmur, gallup, rub or click Lungs:clear without rales, rhonchi, or wheezes PQZ:RAQT, non tender, + BS, do not palpate liver spleen or masses Ext:1+ lower ext edema, 1+ pedal pulses, 2+ radial pulses, positive varicosities of lower extremities Neuro:alert and oriented, MAE, follows commands, + facial symmetry  EKG:a fib , v pacing no acute changes, chronic t wave inversions V4-6  BMET    Component Value Date/Time   NA 141 05/29/2013 1409   K 4.4 05/29/2013 1409   CL 108 05/29/2013 1409   CO2 30 05/29/2013 1409   GLUCOSE 91 05/29/2013 1409   BUN 17 05/29/2013 1409   CREATININE 1.1 05/29/2013 1409   CALCIUM 10.5 05/29/2013 1409   GFRNONAA 51* 01/04/2013 1901   GFRAA 59* 01/04/2013 1901   Basic panel from 08/19/2013 3137 potassium 4.4 chloride 104 BUN 21 creatinine 1.24   ASSESSMENT AND PLAN Bilateral leg edema Diagnosed with heart failure earlier this week currently no shortness of breath the lungs are clear. Continues with lower extremity edema combination of salt intake and varicosities. Her last echo was 2014 and was stable. We'll  increase her Lasix 40 mg twice a day for 4 days then back to 40 mg daily we'll also increase her potassium same time. Labs were reviewed today.  She will call as of no improvement of her edema otherwise she'll see Dr. Jerilynn Mages. Croitoru in 2 months.  HTN (hypertension) Stable  S/P mitral valve replacement with bioprosthetic valve - 29 mm Biocor Last echo July 2014 EF was 55-60% she did have moderate mitral stenosis of her bioprosthesis. She also had mild to moderate aortic regurgitation.  Permanent atrial fibrillation Atrophic and with some ventricular pacing. No tachycardia all rate controlled  Thrombocytopenia Followed by Dr.Ennever   If return of edema or heart failure then would proceed with repeat echo to eval. Her valves.  When her edema has resolved she will begin wearing her support stockings again. She does have venous insufficiency.

## 2013-08-23 ENCOUNTER — Other Ambulatory Visit (INDEPENDENT_AMBULATORY_CARE_PROVIDER_SITE_OTHER): Payer: Medicare Other

## 2013-08-23 DIAGNOSIS — M810 Age-related osteoporosis without current pathological fracture: Secondary | ICD-10-CM

## 2013-08-23 LAB — COMPREHENSIVE METABOLIC PANEL
ALT: 14 U/L (ref 0–35)
AST: 27 U/L (ref 0–37)
Albumin: 4 g/dL (ref 3.5–5.2)
Alkaline Phosphatase: 46 U/L (ref 39–117)
BILIRUBIN TOTAL: 0.6 mg/dL (ref 0.2–1.2)
BUN: 36 mg/dL — ABNORMAL HIGH (ref 6–23)
CALCIUM: 10.7 mg/dL — AB (ref 8.4–10.5)
CHLORIDE: 103 meq/L (ref 96–112)
CO2: 28 mEq/L (ref 19–32)
Creatinine, Ser: 1.7 mg/dL — ABNORMAL HIGH (ref 0.4–1.2)
GFR: 30.38 mL/min — AB (ref >60.00)
GLUCOSE: 112 mg/dL — AB (ref 70–99)
Potassium: 4.9 mEq/L (ref 3.5–5.1)
Sodium: 138 mEq/L (ref 135–145)
TOTAL PROTEIN: 6.5 g/dL (ref 6.0–8.3)

## 2013-08-24 LAB — VITAMIN D 25 HYDROXY (VIT D DEFICIENCY, FRACTURES): VIT D 25 HYDROXY: 67 ng/mL (ref 30–89)

## 2013-08-28 ENCOUNTER — Encounter: Payer: Self-pay | Admitting: Endocrinology

## 2013-08-28 ENCOUNTER — Ambulatory Visit (INDEPENDENT_AMBULATORY_CARE_PROVIDER_SITE_OTHER): Payer: Medicare Other | Admitting: Endocrinology

## 2013-08-28 DIAGNOSIS — N289 Disorder of kidney and ureter, unspecified: Secondary | ICD-10-CM

## 2013-08-28 NOTE — Progress Notes (Signed)
Patient ID: [name], female   DOB: 09/09/1929, 80 y.o.   MRN: [id number]   Reason for Appointment:  Hyperparathyroidism, followup visit    History of Present Illness:   She has had hypercalcemia since about 2010 She also has had history of a ureteral stone and osteoporosis Her T score was -2.8 at the hip on the last measurement in 12/13 and has been stable since 2010 Kidney stones apparently recurred in 3/15. Not clear if she has had stone chemistry done  Her calcium level has been upper normal and only mildly increased now in 7/15  Because of her age and mild hypercalcemia she was not referred for surgery and has been treated with Fosamax for prevention of osteoporosis and fracture She has been taking Fosamax weekly without any side effects since 02/2012 Vitamin D level has been normal with supplementation and now relatively high No recurrence of kidney stones No weight loss or change in appetite  Lab Results  Component Value Date   CALCIUM 10.7* 08/23/2013   CALCIUM 10.5 05/29/2013   CALCIUM 10.5 01/04/2013        Medication List       This list is accurate as of: 08/28/13  1:47 PM.  Always use your most recent med list.               alendronate 70 MG tablet  Commonly known as:  FOSAMAX  TAKE 1 TABLET EVERY 7 DAYS WITH A FULL GLASS OF WATER ON AN EMPTY STOMACH     ALPRAZolam 0.5 MG tablet  Commonly known as:  XANAX  Take 0.5 mg by mouth at bedtime.     amLODipine-olmesartan 5-20 MG per tablet  Commonly known as:  AZOR  Take 1 tablet by mouth daily.     aspirin EC 81 MG tablet  Take 81 mg by mouth every morning.     cholecalciferol 1000 UNITS tablet  Commonly known as:  VITAMIN D  Take 1,000 Units by mouth 2 (two) times daily. 2 tablets in the am and one in the pm     furosemide 40 MG tablet  Commonly known as:  LASIX  Take 40 mg by mouth every morning.     HYDROcodone-acetaminophen 5-325 MG per tablet  Commonly known as:  NORCO  Take 1 tablet by  mouth every 6 (six) hours as needed for moderate pain.     metoprolol tartrate 25 MG tablet  Commonly known as:  LOPRESSOR  Take 12.5 mg by mouth 2 (two) times daily.     multivitamin with minerals Tabs tablet  Take 1 tablet by mouth every morning.     omeprazole 20 MG capsule  Commonly known as:  PRILOSEC  Take 20 mg by mouth every morning.     potassium chloride SA 20 MEQ tablet  Commonly known as:  K-DUR,KLOR-CON  Take 20 mEq by mouth daily after supper.     simvastatin 20 MG tablet  Commonly known as:  ZOCOR  Take 10 mg by mouth daily after supper.     VITAMIN B COMPLEX PO  Take 1 tablet by mouth every morning.        Past Medical History  Diagnosis Date  . CHF (congestive heart failure)   . Edema of both legs 04-23-2013  pt states feet swelling (wears ted hose)    bilateral venous ablation procedures done in IR, L>R  . Hypertension   . Venous insufficiency, peripheral   . H/O hiatal hernia   .  Anxiety   . Depression   . Arthritis   . Permanent atrial fibrillation     CARDIOLOGIST-- DR KGYJEHUD  . Sick sinus syndrome     S/P  PACEMAKER  2009  . S/P mitral valve replacement with bioprosthetic valve     04-03-2009  . S/P cardiac pacemaker procedure     LAST PACER CHECK  04-23-2013  IN [facility]  . History of kidney stones   . Right ureteral stone   . Thrombocytopenia, immune     CHRONIC---  HEMOTOLOGIST---  DR ENNEVER  . Osteopenia   . Benign essential tremor   . Schatzki's ring   . Hyperparathyroidism     MILD--  STABLE  . Hyperlipidemia   . GERD (gastroesophageal reflux disease)   . History of basal cell carcinoma excision     foredhead  . Wears glasses     Past Surgical History  Procedure Laterality Date  . Hand surgery Right 05/2011  . Permanent pacemaker insertion  02/26/2007    Dual Chamber Medtronic Programmed VVIR--- Generator [id number]/  #[id number] H   . Cystoscopy w/ ureteral stent placement Left 08/01/2012    Procedure: CYSTOSCOPY WITH RETROGRADE  PYELOGRAM/URETERAL STENT PLACEMENT;  Surgeon: Dutch Gray, MD;  Location: [facility];  Service: Urology;  Laterality: Left;  . Cystoscopy with ureteroscopy Left 08/14/2012    Procedure: LEFT URETEROSCOPY WITH HOLMIUM LASER AND LEFT  STENT PLACEMENT;  Surgeon: Malka So, MD;  Location: [facility];  Service: Urology;  Laterality: Left;  . Holmium laser application Left 09/18/8848    Procedure:  HOLMIUM LASER APPLICATION;  Surgeon: Malka So, MD;  Location: [facility];  Service: Urology;  Laterality: Left;  . Nm myocar perf wall motion  03/01/1999    mild lateral ischemia  . Mitral valve replacement with chordal presevation using bioprosthetic valve/  ligation left atrial appendage/  left-sided maze procedure  04-03-2009  DR Ethel Rana,  SERIAL # [id number]  . Transthoracic echocardiogram  09-07-2012  DR CROITORU    MILD LVH/  EF 55-60%/  MILD TO MODERATE AR/   SEVERE LAE  &  RAE/  MILD TO MODERATE TR/   MITRAL VALVE BIOPROSTHESIS LEAFLET NORMAL,  MODERATE STENOSIS WITH NO SIG. REGURG/  MODERATE RVE  . Cardiac catheterization  03/27/2009    normal coronaries; grade IV Mitral Insuff.  . Cardiac catheterization  2001  &  2006  . Vaginal hysterectomy  1982  . Extracorporeal shock wave lithotripsy Left 07-26-2012  . Cystoscopy with ureteroscopy and stent placement Right 04/25/2013    Procedure: RIGHT URETEROSCOPY WITH  STENT PLACEMENT;  Surgeon: Irine Seal, MD;  Location[facility];  Service: Urology;  Laterality: Right;  . Holmium laser application Right 6/76/7209    Procedure: HOLMIUM LASER APPLICATION;  Surgeon: Irine Seal, MD;  Location: [facility];  Service: Urology;  Laterality: Right;    Family History  Problem Relation Age of Onset  . Cancer Mother   . Heart attack Father     Social History:  reports that she has never smoked. She has never used smokeless tobacco. She reports that she does not drink alcohol or use illicit drugs.  Allergies: No Known  Allergies  REVIEW of SYSTEMS:  She apparently developed a lot of edema and her weight had gone up 20 pounds, her diuretics were increased and now is taking 40 mg daily. Apparently did not have CHF  She has had history of hypercholesterolemia and hypertension  No history of hypothyroidism     Examination:   BP 111/64  Pulse 80  Temp(Src) 98.3 F (36.8 C)  Resp 16  Ht 5\' 5"  (1.651 m)  Wt 182 lb 9.6 oz (82.827 kg)  BMI 30.39 kg/m2   GENERAL APPEARANCE: Alert And looks well.            No ankle edema present            Assessments   Hyperparathyroidism, mild and stable calcium of 10.7 Associated mild asymptomatic osteoporosis Recurrent nephrolithiasis; no recent events related to this since 3/15  Worsening renal function probably related to overdiuresis, creatinine now 1.7 compared to 1.2 previously   Treatment:  Continue monitoring calcium periodically Reduce vitamin D 2 2000 units daily She will hold her Fosamax until renal function is normal  She will followup with her PCP tomorrow regarding her antihypertensives and diuretics, labs forwarded   [facility] 08/28/2013, 1:47 PM    No visits with results within 1 Day(s) from this visit. Latest known visit with results is:  Appointment on 08/23/2013  Component Date Value Ref Range Status  . Sodium 08/23/2013 138  135 - 145 mEq/L Final  . Potassium 08/23/2013 4.9  3.5 - 5.1 mEq/L Final  . Chloride 08/23/2013 103  96 - 112 mEq/L Final  . CO2 08/23/2013 28  19 - 32 mEq/L Final  . Glucose, Bld 08/23/2013 112* 70 - 99 mg/dL Final  . BUN 08/23/2013 36* 6 - 23 mg/dL Final  . Creatinine, Ser 08/23/2013 1.7* 0.4 - 1.2 mg/dL Final  . Total Bilirubin 08/23/2013 0.6  0.2 - 1.2 mg/dL Final  . Alkaline Phosphatase 08/23/2013 46  39 - 117 U/L Final  . AST 08/23/2013 27  0 - 37 U/L Final  . ALT 08/23/2013 14  0 - 35 U/L Final  . Total Protein 08/23/2013 6.5  6.0 - 8.3 g/dL Final  . Albumin 08/23/2013 4.0  3.5 - 5.2 g/dL  Final  . Calcium 08/23/2013 10.7* 8.4 - 10.5 mg/dL Final  . GFR 08/23/2013 30.38* >60.00 mL/min Final  . Vit D, 25-Hydroxy 08/23/2013 67  30 - 89 ng/mL Final   Comment: This assay accurately quantifies Vitamin D, which is the sum of the                          25-Hydroxy forms of Vitamin D2 and D3.  Studies have shown that the                          optimum concentration of 25-Hydroxy Vitamin D is 30 ng/mL or higher.                           Concentrations of Vitamin D between 20 and 29 ng/mL are considered to                          be insufficient and concentrations less than 20 ng/mL are considered                          to be deficient for Vitamin D.

## 2013-08-28 NOTE — Patient Instructions (Addendum)
Alendronate: stop until kidney test back to normal  Vitamin D3, 2000 units daily

## 2013-10-16 ENCOUNTER — Ambulatory Visit (INDEPENDENT_AMBULATORY_CARE_PROVIDER_SITE_OTHER): Payer: Medicare Other | Admitting: *Deleted

## 2013-10-16 DIAGNOSIS — I4891 Unspecified atrial fibrillation: Secondary | ICD-10-CM

## 2013-10-16 DIAGNOSIS — I4821 Permanent atrial fibrillation: Secondary | ICD-10-CM

## 2013-10-16 LAB — MDC_IDC_ENUM_SESS_TYPE_INCLINIC
Battery Impedance: 1437 Ohm
Brady Statistic RV Percent Paced: 58 %
Date Time Interrogation Session: 20150902151549
Lead Channel Impedance Value: 557 Ohm
Lead Channel Pacing Threshold Amplitude: 0.75 V
Lead Channel Pacing Threshold Pulse Width: 0.4 ms
Lead Channel Sensing Intrinsic Amplitude: 8 mV
Lead Channel Setting Pacing Pulse Width: 0.4 ms
Lead Channel Setting Sensing Sensitivity: 4 mV
MDC IDC MSMT BATTERY REMAINING LONGEVITY: 47 mo
MDC IDC MSMT BATTERY VOLTAGE: 2.77 V
MDC IDC MSMT LEADCHNL RA IMPEDANCE VALUE: 67 Ohm
MDC IDC SET LEADCHNL RV PACING AMPLITUDE: 2.5 V

## 2013-10-16 NOTE — Progress Notes (Signed)
Pacemaker check in clinic. Normal device function. Thresholds, sensing, impedances consistent with previous measurements. Device programmed to maximize longevity. 3 high ventricular rates noted 3-4 seconds. Device programmed at appropriate safety margins. Histogram distribution appropriate for patient activity level. Device programmed to optimize intrinsic conduction. Estimated longevity 4 years.  Patient education completed.  ROV 6 months with Dr. Sallyanne Kuster.

## 2013-10-24 ENCOUNTER — Encounter: Payer: Self-pay | Admitting: Cardiovascular Disease

## 2013-10-24 ENCOUNTER — Ambulatory Visit (INDEPENDENT_AMBULATORY_CARE_PROVIDER_SITE_OTHER): Payer: Medicare Other | Admitting: Cardiovascular Disease

## 2013-10-24 VITALS — BP 124/74 | HR 74 | Resp 16 | Ht 65.0 in | Wt 184.9 lb

## 2013-10-24 DIAGNOSIS — Z952 Presence of prosthetic heart valve: Secondary | ICD-10-CM

## 2013-10-24 DIAGNOSIS — Z953 Presence of xenogenic heart valve: Secondary | ICD-10-CM

## 2013-10-24 DIAGNOSIS — I5032 Chronic diastolic (congestive) heart failure: Secondary | ICD-10-CM

## 2013-10-24 DIAGNOSIS — I509 Heart failure, unspecified: Secondary | ICD-10-CM

## 2013-10-24 DIAGNOSIS — I4821 Permanent atrial fibrillation: Secondary | ICD-10-CM

## 2013-10-24 DIAGNOSIS — I4891 Unspecified atrial fibrillation: Secondary | ICD-10-CM

## 2013-10-24 DIAGNOSIS — I10 Essential (primary) hypertension: Secondary | ICD-10-CM

## 2013-10-24 NOTE — Patient Instructions (Signed)
Your physician recommends that you weigh, daily, at the same time every day, and in the same amount of clothing. Please record your daily weights on the handout provided and bring it to your next appointment.   If your weight increases 3 lbs in 24 hours or 5 lbs in one week double the Furosemide and Potassium.  Dr. Sallyanne Kuster recommends that you schedule a follow-up appointment in: One year.

## 2013-10-25 ENCOUNTER — Encounter: Payer: Self-pay | Admitting: Cardiovascular Disease

## 2013-10-25 ENCOUNTER — Telehealth: Payer: Self-pay | Admitting: Cardiovascular Disease

## 2013-10-25 MED ORDER — AMLODIPINE-OLMESARTAN 5-20 MG PO TABS: 1.0000 | ORAL_TABLET | Freq: Every day | ORAL | Status: DC

## 2013-10-25 NOTE — Assessment & Plan Note (Signed)
Excellent blood pressure control 

## 2013-10-25 NOTE — Assessment & Plan Note (Signed)
Will be worthwhile repeating another echocardiogram if heart failure episodes are frequent

## 2013-10-25 NOTE — Assessment & Plan Note (Signed)
She appears well compensated today, and YHA functional class I. She still has mild ankle swelling, but no signs of left heart failure. We'll keep on the same dose of diuretic. Reminded her about the importance of sodium restriction and daily weight monitoring.

## 2013-10-25 NOTE — Assessment & Plan Note (Signed)
Conflicting benefits of more intense ventricular rate control. She will benefit from a slow heart rate because she has moderate obstruction across her mitral valve prosthesis. On the other hand excessive right ventricular pacing could lead to worsened diastolic function and congestive heart failure. I'm not sure where the balance of detriment/benefit lies with rate control medications. Will leave her on the very low dose of beta blocker that she is currently taking.

## 2013-10-25 NOTE — Telephone Encounter (Signed)
Pt called in stating that she had an appt with Dr. Loletha Grayer yesterday and he mentioned to her about getting some samples of Azor and she would like to come in and pick some up. Please call  Thanks

## 2013-10-25 NOTE — Telephone Encounter (Signed)
Samples at front desk. Patient aware. Instructed to take these samples first as they expire in October.

## 2013-10-25 NOTE — Progress Notes (Signed)
Patient ID: [name], female   DOB: [birth year], 80 y.o.   MRN: [id number]     Reason for office visit Valvular heart disease, mitral valve replacement with biological prosthesis, permanent atrial fibrillation, diastolic heart failure, pacemaker followup  80 year old woman with a history of mitral valve replacement (29 mm Biocor, Dr. Prescott Gum, 2011), permanent atrial fibrillation status post pacemaker implantation (dual chamber Medtronic device programmed VVIR), status post atrial appendage ligation, recurrent nephrolithiasis, hypertension and hyperlipidemia. Cardiac catheterization in 2011 showed normal coronary arteries.   A few months ago [name] had problems with lower extremity edema, but current diuretic regimen has kept this problem at [place]. In fact she weighs a couple of pounds less than she did in July is substantially less then when diuretic therapy was initiated.   Her last echocardiogram shows moderately elevated gradients across her biological prosthesis, but not changed from the previous echo in 2011. She has normal left ventricular systolic function.  Her remote pacemaker check performed on September 2 shows normal pacemaker function. She has permanent atrial fibrillation and has roughly 50% ventricular pacing. This is comparable to her previous checks. Rapid ventricular rate is extremely rare.  She has not had any bleeding complications. She is felt to be at high risk for injury secondary to falls and general frail condition and does not take full anticoagulations. She also has chronic mild thrombocytopenia, felt to be immune mediated.   No Known Allergies  Current Outpatient Prescriptions  Medication Sig Dispense Refill  . ALPRAZolam (XANAX) 0.5 MG tablet Take 0.5 mg by mouth at bedtime.      Marland Kitchen amLODipine-olmesartan (AZOR) 5-20 MG per tablet Take 1 tablet by mouth daily.  30 tablet  10  . aspirin EC 81 MG tablet Take 81 mg by mouth every morning.      . B Complex  Vitamins (VITAMIN B COMPLEX PO) Take 1 tablet by mouth every morning.       . furosemide (LASIX) 40 MG tablet Take 40 mg by mouth every morning.       Marland Kitchen HYDROcodone-acetaminophen (NORCO) 5-325 MG per tablet Take 1 tablet by mouth every 6 (six) hours as needed for moderate pain.  30 tablet  0  . metoprolol tartrate (LOPRESSOR) 25 MG tablet Take 12.5 mg by mouth 2 (two) times daily.       . Multiple Vitamin (MULTIVITAMIN WITH MINERALS) TABS Take 1 tablet by mouth every morning.      Marland Kitchen omeprazole (PRILOSEC) 20 MG capsule Take 20 mg by mouth every morning.       . potassium chloride SA (K-DUR,KLOR-CON) 20 MEQ tablet Take 20 mEq by mouth daily after supper.       . simvastatin (ZOCOR) 20 MG tablet Take 10 mg by mouth daily after supper.        No current facility-administered medications for this visit.    Past Medical History  Diagnosis Date  . CHF (congestive heart failure)   . Edema of both legs 04-23-2013  pt states feet swelling (wears ted hose)    bilateral venous ablation procedures done in IR, L>R  . Hypertension   . Venous insufficiency, peripheral   . H/O hiatal hernia   . Anxiety   . Depression   . Arthritis   . Permanent atrial fibrillation     CARDIOLOGIST-- DR [id number]  . Sick sinus syndrome     S/P  PACEMAKER  2009  . S/P mitral valve replacement with bioprosthetic valve  04-03-2009  . S/P cardiac pacemaker procedure     LAST PACER CHECK  04-23-2013  IN [facility]  . History of kidney stones   . Right ureteral stone   . Thrombocytopenia, immune     CHRONIC---  HEMOTOLOGIST---  DR ENNEVER  . Osteopenia   . Benign essential tremor   . Schatzki's ring   . Hyperparathyroidism     MILD--  STABLE  . Hyperlipidemia   . GERD (gastroesophageal reflux disease)   . History of basal cell carcinoma excision     [place]  . Wears glasses     Past Surgical History  Procedure Laterality Date  . Hand surgery Right 05/2011  . Permanent pacemaker insertion  02/26/2007    Dual  Chamber Medtronic Programmed VVIR--- Generator [id number]/  #[id number]   . Cystoscopy w/ ureteral stent placement Left 08/01/2012    Procedure: CYSTOSCOPY WITH RETROGRADE PYELOGRAM/URETERAL STENT PLACEMENT;  Surgeon: Dutch Gray, MD;  Location: [facility];  Service: Urology;  Laterality: Left;  . Cystoscopy with ureteroscopy Left 08/14/2012    Procedure: LEFT URETEROSCOPY WITH HOLMIUM LASER AND LEFT  STENT PLACEMENT;  Surgeon: Malka So, MD;  Location: [facility];  Service: Urology;  Laterality: Left;  . Holmium laser application Left 07/20/628    Procedure:  HOLMIUM LASER APPLICATION;  Surgeon: Malka So, MD;  Location: [facility];  Service: Urology;  Laterality: Left;  . Nm myocar perf wall motion  03/01/1999    mild lateral ischemia  . Mitral valve replacement with chordal presevation using bioprosthetic valve/  ligation left atrial appendage/  left-sided maze procedure  04-03-2009  DR Ethel Rana,  SERIAL # [id number]  . Transthoracic echocardiogram  09-07-2012  DR Shanaye Rief    MILD LVH/  EF 55-60%/  MILD TO MODERATE AR/   SEVERE LAE  &  RAE/  MILD TO MODERATE TR/   MITRAL VALVE BIOPROSTHESIS LEAFLET NORMAL,  MODERATE STENOSIS WITH NO SIG. REGURG/  MODERATE RVE  . Cardiac catheterization  03/27/2009    normal coronaries; grade IV Mitral Insuff.  . Cardiac catheterization  2001  &  2006  . Vaginal hysterectomy  1982  . Extracorporeal shock wave lithotripsy Left 07-26-2012  . Cystoscopy with ureteroscopy and stent placement Right 04/25/2013    Procedure: RIGHT URETEROSCOPY WITH  STENT PLACEMENT;  Surgeon: Irine Seal, MD;  Location: [facility];  Service: Urology;  Laterality: Right;  . Holmium laser application Right 5/57/3220    Procedure: HOLMIUM LASER APPLICATION;  Surgeon: Irine Seal, MD;  Location: [facility];  Service: Urology;  Laterality: Right;    Family History  Problem Relation Age of Onset  . Cancer Mother   . Heart attack Father     History    Social History  . Marital Status: Married    Spouse Name: N/A    Number of Children: N/A  . Years of Education: N/A   Occupational History  . Not on file.   Social History Main Topics  . Smoking status: Never Smoker   . Smokeless tobacco: Never Used     Comment: never used tobacco  . Alcohol Use: No  . Drug Use: No  . Sexual Activity: Not on file   Other Topics Concern  . Not on file   Social History Narrative  . No narrative on file    Review of systems: Lower extremity edema is mild. The patient specifically denies any chest pain at rest or with exertion,  dyspnea at rest or with exertion, orthopnea, paroxysmal nocturnal dyspnea, syncope, palpitations, focal neurological deficits, intermittent claudication, unexplained weight gain, cough, hemoptysis or wheezing.  The patient also denies abdominal pain, nausea, vomiting, dysphagia, diarrhea, constipation, polyuria, polydipsia, dysuria, hematuria, frequency, urgency, abnormal bleeding or bruising, fever, chills, unexpected weight changes, mood swings, change in skin or hair texture, change in voice quality, auditory or visual problems, allergic reactions or rashes, new musculoskeletal complaints other than usual "aches and pains".   PHYSICAL EXAM BP 124/74  Pulse 74  Resp 16  Ht 5\' 5"  (1.651 m)  Wt 184 lb 14.4 oz (83.87 kg)  BMI 30.77 kg/m2 General: Alert, oriented x3, no distress  Head: no evidence of trauma, PERRL, EOMI, no exophtalmos or lid lag, no myxedema, no xanthelasma; normal ears, nose and oropharynx  Neck: normal jugular venous pulsations and no hepatojugular reflux; brisk carotid pulses without delay and no carotid bruits  Chest: clear to auscultation, no signs of consolidation by percussion or palpation, normal fremitus, symmetrical and full respiratory excursions. Healthy appearance of the left subclavian pacemaker site  Cardiovascular: normal position and quality of the apical impulse, irregular rhythm,  normal first and second heart sounds, no murmurs, rubs or gallops  Abdomen: no tenderness or distention, no masses by palpation, no abnormal pulsatility or arterial bruits, normal bowel sounds, no hepatosplenomegaly  Extremities: no clubbing, cyanosis or edema; 2+ radial, ulnar and brachial pulses bilaterally; 2+ right femoral, posterior tibial and dorsalis pedis pulses; 2+ left femoral, posterior tibial and dorsalis pedis pulses; no subclavian or femoral bruits  Neurological: grossly nonfocal   BMET    Component Value Date/Time   NA 138 08/23/2013 1103   K 4.9 08/23/2013 1103   CL 103 08/23/2013 1103   CO2 28 08/23/2013 1103   GLUCOSE 112* 08/23/2013 1103   BUN 36* 08/23/2013 1103   CREATININE 1.7* 08/23/2013 1103   CALCIUM 10.7* 08/23/2013 1103   GFRNONAA 51* 01/04/2013 1901   GFRAA 59* 01/04/2013 1901     ASSESSMENT AND PLAN Permanent atrial fibrillation Conflicting benefits of more intense ventricular rate control. She will benefit from a slow heart rate because she has moderate obstruction across her mitral valve prosthesis. On the other hand excessive right ventricular pacing could lead to worsened diastolic function and congestive heart failure. I'm not sure where the balance of detriment/benefit lies with rate control medications. Will leave her on the very low dose of beta blocker that she is currently taking.  HTN (hypertension) Excellent blood pressure control.  Chronic diastolic congestive heart failure She appears well compensated today, and YHA functional class I. She still has mild ankle swelling, but no signs of left heart failure. We'll keep on the same dose of diuretic. Reminded her about the importance of sodium restriction and daily weight monitoring.  S/P mitral valve replacement with bioprosthetic valve - 29 mm Biocor Will be worthwhile repeating another echocardiogram if heart failure episodes are frequent   Patient Instructions  Your physician recommends that you  weigh, daily, at the same time every day, and in the same amount of clothing. Please record your daily weights on the handout provided and bring it to your next appointment.   If your weight increases 3 lbs in 24 hours or 5 lbs in one week double the Furosemide and Potassium.  Dr. Sallyanne Kuster recommends that you schedule a follow-up appointment in: One year.      Christina Humbles, MD, [place] [phone] office [phone] pager

## 2013-10-28 ENCOUNTER — Encounter: Payer: Self-pay | Admitting: Cardiovascular Disease

## 2013-11-08 ENCOUNTER — Ambulatory Visit: Payer: Medicare Other | Admitting: Cardiovascular Disease

## 2013-11-27 ENCOUNTER — Other Ambulatory Visit (HOSPITAL_BASED_OUTPATIENT_CLINIC_OR_DEPARTMENT_OTHER): Payer: Medicare Other | Admitting: Lab

## 2013-11-27 ENCOUNTER — Encounter: Payer: Self-pay | Admitting: Hematology & Oncology

## 2013-11-27 ENCOUNTER — Ambulatory Visit (HOSPITAL_BASED_OUTPATIENT_CLINIC_OR_DEPARTMENT_OTHER): Payer: Medicare Other | Admitting: Hematology & Oncology

## 2013-11-27 VITALS — BP 135/65 | HR 75 | Temp 97.9°F | Resp 16 | Ht 65.0 in | Wt 185.0 lb

## 2013-11-27 DIAGNOSIS — D696 Thrombocytopenia, unspecified: Secondary | ICD-10-CM

## 2013-11-27 LAB — CBC WITH DIFFERENTIAL (CANCER CENTER ONLY)
BASO#: 0.1 10*3/uL (ref 0.0–0.2)
BASO%: 1.3 % (ref 0.0–2.0)
EOS%: 2.7 % (ref 0.0–7.0)
Eosinophils Absolute: 0.1 10*3/uL (ref 0.0–0.5)
HCT: 38.6 % (ref 34.8–46.6)
HGB: 12.5 g/dL (ref 11.6–15.9)
LYMPH#: 1.9 10*3/uL (ref 0.9–3.3)
LYMPH%: 40 % (ref 14.0–48.0)
MCH: 30.6 pg (ref 26.0–34.0)
MCHC: 32.4 g/dL (ref 32.0–36.0)
MCV: 94 fL (ref 81–101)
MONO#: 0.6 10*3/uL (ref 0.1–0.9)
MONO%: 13.4 % — ABNORMAL HIGH (ref 0.0–13.0)
NEUT#: 2 10*3/uL (ref 1.5–6.5)
NEUT%: 42.6 % (ref 39.6–80.0)
PLATELETS: 94 10*3/uL — AB (ref 145–400)
RBC: 4.09 10*6/uL (ref 3.70–5.32)
RDW: 15.3 % (ref 11.1–15.7)
WBC: 4.8 10*3/uL (ref 3.9–10.0)

## 2013-11-27 LAB — CHCC SATELLITE - SMEAR

## 2013-11-27 NOTE — Progress Notes (Signed)
Hematology and Oncology Follow Up Visit  [name] [id number] 06-Jan-1930 80 y.o. 11/27/2013   Principle Diagnosis:   Chronic immune thrombocytopenia-mild  Current Therapy:    Observation     Interim History:  [name] is back for followup. Is here every 6 months. She is doing well. She's had no problems bleeding. There's been no bruising. She's had a change in her medications.  She's had no cough. There's been no change in bowel or bladder habits. She's had a good appetite. She's had no nausea or vomiting.  She is worried about some weight gain.  Medications: Current outpatient prescriptions:ALPRAZolam (XANAX) 0.5 MG tablet, Take 0.5 mg by mouth at bedtime., Disp: , Rfl: ;  amLODipine-olmesartan (AZOR) 5-20 MG per tablet, Take 1 tablet by mouth daily., Disp: 42 tablet, Rfl: 0;  aspirin EC 81 MG tablet, Take 81 mg by mouth every morning., Disp: , Rfl: ;  B Complex Vitamins (VITAMIN B COMPLEX PO), Take 1 tablet by mouth every morning. , Disp: , Rfl:  furosemide (LASIX) 40 MG tablet, Take 40 mg by mouth every morning. , Disp: , Rfl: ;  HYDROcodone-acetaminophen (NORCO) 5-325 MG per tablet, Take 1 tablet by mouth every 6 (six) hours as needed for moderate pain., Disp: 30 tablet, Rfl: 0;  metoprolol tartrate (LOPRESSOR) 25 MG tablet, Take 12.5 mg by mouth 2 (two) times daily. , Disp: , Rfl: ;  Multiple Vitamin (MULTIVITAMIN WITH MINERALS) TABS, Take 1 tablet by mouth every morning., Disp: , Rfl:  omeprazole (PRILOSEC) 20 MG capsule, Take 20 mg by mouth every morning. , Disp: , Rfl: ;  potassium chloride SA (K-DUR,KLOR-CON) 20 MEQ tablet, Take 20 mEq by mouth daily after supper. , Disp: , Rfl: ;  simvastatin (ZOCOR) 20 MG tablet, Take 10 mg by mouth daily after supper. , Disp: , Rfl:   Allergies: No Known Allergies  Past Medical History, Surgical history, Social history, and Family History were reviewed and updated.  Review of Systems: As above  Physical Exam:  height is 5\' 5"   (1.651 m) and weight is 185 lb (83.915 kg). Her oral temperature is 97.9 F (36.6 C). Her blood pressure is 135/65 and her pulse is 75. Her respiration is 16.   Elderly white female. Head exam shows no ocular or oral lesions. She has no palpable cervical or supraclavicular lymph nodes. Lungs are clear. Cardiac exam regular in rhythm with no murmurs, rubs or bruits. Abdomen is soft. Has good bowel sounds. There is no fluid wave. There is no palpable liver or spleen tip. Back exam no tenderness over the spine, ribs or hips. Extremities shows significant rheumatoid arthritic changes. Skin exam no rashes, ecchymosis or petechia.  Lab Results  Component Value Date   WBC 4.8 11/27/2013   HGB 12.5 11/27/2013   HCT 38.6 11/27/2013   MCV 94 11/27/2013   PLT 94* 11/27/2013     Chemistry      Component Value Date/Time   NA 138 08/23/2013 1103   K 4.9 08/23/2013 1103   CL 103 08/23/2013 1103   CO2 28 08/23/2013 1103   BUN 36* 08/23/2013 1103   CREATININE 1.7* 08/23/2013 1103      Component Value Date/Time   CALCIUM 10.7* 08/23/2013 1103   ALKPHOS 46 08/23/2013 1103   AST 27 08/23/2013 1103   ALT 14 08/23/2013 1103   BILITOT 0.6 08/23/2013 1103         Impression and Plan: [name] is a 80 year old female. She has  mild, none clinical thrombocytopenia. She has never had a problem with this.  There is also nothing that we have to do for this right now.  I'll plan to see her back in 6 months time.  She does not need any blood work in between visits.   Volanda Napoleon, MD 10/14/20156:24 PM

## 2013-12-24 ENCOUNTER — Other Ambulatory Visit: Payer: Self-pay | Admitting: Urology

## 2013-12-24 ENCOUNTER — Ambulatory Visit (HOSPITAL_COMMUNITY)
Admission: RE | Admit: 2013-12-24 | Discharge: 2013-12-24 | Disposition: A | Discharge status: Home / Self Care | Payer: Medicare Other | Source: Ambulatory Visit | Attending: Urology | Admitting: Urology

## 2013-12-24 DIAGNOSIS — M419 Scoliosis, unspecified: Secondary | ICD-10-CM | POA: Diagnosis not present

## 2013-12-24 DIAGNOSIS — N200 Calculus of kidney: Secondary | ICD-10-CM | POA: Diagnosis not present

## 2013-12-24 DIAGNOSIS — M5136 Other intervertebral disc degeneration, lumbar region: Secondary | ICD-10-CM | POA: Diagnosis not present

## 2014-01-28 ENCOUNTER — Ambulatory Visit: Payer: Medicare Other | Admitting: Endocrinology

## 2014-01-29 ENCOUNTER — Telehealth: Payer: Self-pay | Admitting: Cardiovascular Disease

## 2014-01-29 NOTE — Telephone Encounter (Signed)
Mrs.Bovenzi is calling to find when was her last pacer check . I see that the last was in September , but she says she think she gets them every 3 months Please call    Thanks

## 2014-01-29 NOTE — Telephone Encounter (Signed)
Patient to F/U w/[facility] in 6 months.

## 2014-01-29 NOTE — Telephone Encounter (Signed)
Patient to F/U w/[facility] in 04-2014 for 6 months ck.

## 2014-01-30 ENCOUNTER — Telehealth: Payer: Self-pay | Admitting: Cardiovascular Disease

## 2014-01-30 NOTE — Telephone Encounter (Signed)
Pt called in a little confused on when she needs to come back in for her Pacer check appt.  She would like to know if she needs come in March or September. Please call and clarify  Thanks

## 2014-01-30 NOTE — Telephone Encounter (Signed)
RN spoke to [facility] clarification Patient needs an appointment in March 2016 - with Dr C.and pacer check at the [place] RN called and spoke to patient. Informed patient she will have an appointment in march 2016- office will call . Patient verbalized understanding .

## 2014-02-25 ENCOUNTER — Other Ambulatory Visit: Payer: Medicare Other

## 2014-02-28 ENCOUNTER — Ambulatory Visit: Payer: Medicare Other | Admitting: Endocrinology

## 2014-04-22 ENCOUNTER — Ambulatory Visit: Payer: Medicare Other | Admitting: Cardiology

## 2014-05-06 ENCOUNTER — Encounter: Payer: Self-pay | Admitting: Cardiovascular Disease

## 2014-05-06 ENCOUNTER — Ambulatory Visit (INDEPENDENT_AMBULATORY_CARE_PROVIDER_SITE_OTHER): Payer: Medicare Other | Admitting: Cardiovascular Disease

## 2014-05-06 VITALS — BP 150/80 | HR 91 | Resp 16 | Ht 65.0 in | Wt 183.0 lb

## 2014-05-06 DIAGNOSIS — I482 Chronic atrial fibrillation: Secondary | ICD-10-CM

## 2014-05-06 DIAGNOSIS — Z953 Presence of xenogenic heart valve: Secondary | ICD-10-CM

## 2014-05-06 DIAGNOSIS — I5032 Chronic diastolic (congestive) heart failure: Secondary | ICD-10-CM

## 2014-05-06 DIAGNOSIS — I4821 Permanent atrial fibrillation: Secondary | ICD-10-CM

## 2014-05-06 DIAGNOSIS — I10 Essential (primary) hypertension: Secondary | ICD-10-CM

## 2014-05-06 DIAGNOSIS — Z950 Presence of cardiac pacemaker: Secondary | ICD-10-CM | POA: Insufficient documentation

## 2014-05-06 MED ORDER — AMLODIPINE-OLMESARTAN 5-20 MG PO TABS: 1.0000 | ORAL_TABLET | Freq: Every day | ORAL | Status: DC

## 2014-05-06 NOTE — Progress Notes (Signed)
Patient ID: [name], female   DOB: [birth year], 81 y.o.   MRN: [id number]     Cardiology Office Note   Date:  05/06/2014   ID:  [name], DOB 02-28-1929, MRN [id number]  PCP:   Melinda Crutch, MD  Cardiologist:   Sanda Klein, MD   Chief Complaint  Patient presents with  . Follow-up    Yearly pacemaker check:  No complaints of chest pain, SOB, edema or dizziness.  [place] going down stairs a month ago injuring left knee.      History of Present Illness: [name] is a 81 y.o. female who presents for folllowup of valvular heart disease, mitral valve replacement with biological prosthesis, permanent atrial fibrillation, diastolic heart failure, pacemaker followup  She has a history of mitral valve replacement (29 mm Biocor, Dr. Prescott Gum, 2011), permanent atrial fibrillation status post pacemaker implantation (dual chamber Medtronic device programmed VVIR), status post atrial appendage ligation, recurrent nephrolithiasis, hypertension and hyperlipidemia. Cardiac catheterization in 2011 showed normal coronary arteries. She is not on anticoagulation due to frequent falls. She has mild immune mediated thrombocytopenia.  Her last echocardiogram shows moderately elevated gradients across her biological prosthesis, but not changed from the previous echo in 2011. She has normal left ventricular systolic function.  Today, pacemaker check shows normal pacemaker function. She has permanent atrial fibrillation and has roughly 59% ventricular pacing. This is comparable to her previous checks. Rapid ventricular rate is extremely rare.  She continues to have unsteady gait. Last fall with knee injury was a couple of months ago. She had bruising, but no severe injury.   Past Medical History  Diagnosis Date  . CHF (congestive heart failure)   . Edema of both legs 04-23-2013  pt states feet swelling (wears ted hose)    bilateral venous ablation procedures done in IR, L>R  . Hypertension     . Venous insufficiency, peripheral   . H/O hiatal hernia   . Anxiety   . Depression   . Arthritis   . Permanent atrial fibrillation     CARDIOLOGIST-- DR [id number]  . Sick sinus syndrome     S/P  PACEMAKER  2009  . S/P mitral valve replacement with bioprosthetic valve     04-03-2009  . S/P cardiac pacemaker procedure     LAST PACER CHECK  04-23-2013  IN [facility]  . History of kidney stones   . Right ureteral stone   . Thrombocytopenia, immune     CHRONIC---  HEMOTOLOGIST---  DR ENNEVER  . Osteopenia   . Benign essential tremor   . Schatzki's ring   . Hyperparathyroidism     MILD--  STABLE  . Hyperlipidemia   . GERD (gastroesophageal reflux disease)   . History of basal cell carcinoma excision     [place]  . Wears glasses     Past Surgical History  Procedure Laterality Date  . Hand surgery Right 05/2011  . Permanent pacemaker insertion  02/26/2007    Dual Chamber Medtronic Programmed VVIR--- Generator [id number]  #[id number]   . Cystoscopy w/ ureteral stent placement Left 08/01/2012    Procedure: CYSTOSCOPY WITH RETROGRADE PYELOGRAM/URETERAL STENT PLACEMENT;  Surgeon: Dutch Gray, MD;  Location: [facility];  Service: Urology;  Laterality: Left;  . Cystoscopy with ureteroscopy Left 08/14/2012    Procedure: LEFT URETEROSCOPY WITH HOLMIUM LASER AND LEFT  STENT PLACEMENT;  Surgeon: Malka So, MD;  Location: [facility];  Service: Urology;  Laterality: Left;  . Holmium laser application Left  08/14/2012    Procedure:  HOLMIUM LASER APPLICATION;  Surgeon: Malka So, MD;  Location: [facility];  Service: Urology;  Laterality: Left;  . Nm myocar perf wall motion  03/01/1999    mild lateral ischemia  . Mitral valve replacement with chordal presevation using bioprosthetic valve/  ligation left atrial appendage/  left-sided maze procedure  04-03-2009  DR Ethel Rana,  SERIAL # [id number]  . Transthoracic echocardiogram  09-07-2012  DR Deionna Marcantonio    MILD LVH/  EF 55-60%/  MILD TO MODERATE AR/    SEVERE LAE  &  RAE/  MILD TO MODERATE TR/   MITRAL VALVE BIOPROSTHESIS LEAFLET NORMAL,  MODERATE STENOSIS WITH NO SIG. REGURG/  MODERATE RVE  . Cardiac catheterization  03/27/2009    normal coronaries; grade IV Mitral Insuff.  . Cardiac catheterization  2001  &  2006  . Vaginal hysterectomy  1982  . Extracorporeal shock wave lithotripsy Left 07-26-2012  . Cystoscopy with ureteroscopy and stent placement Right 04/25/2013    Procedure: RIGHT URETEROSCOPY WITH  STENT PLACEMENT;  Surgeon: Irine Seal, MD;  Location: [facility];  Service: Urology;  Laterality: Right;  . Holmium laser application Right 5/42/7062    Procedure: HOLMIUM LASER APPLICATION;  Surgeon: Irine Seal, MD;  Location: [facility];  Service: Urology;  Laterality: Right;     Current Outpatient Prescriptions  Medication Sig Dispense Refill  . ALPRAZolam (XANAX) 0.5 MG tablet Take 0.5 mg by mouth at bedtime.    Marland Kitchen amLODipine-olmesartan (AZOR) 5-20 MG per tablet Take 1 tablet by mouth daily. 21 tablet 0  . aspirin EC 81 MG tablet Take 81 mg by mouth every morning.    . B Complex Vitamins (VITAMIN B COMPLEX PO) Take 1 tablet by mouth every morning.     . furosemide (LASIX) 40 MG tablet Take 40 mg by mouth every morning.     Marland Kitchen HYDROcodone-acetaminophen (NORCO) 5-325 MG per tablet Take 1 tablet by mouth every 6 (six) hours as needed for moderate pain. 30 tablet 0  . metoprolol tartrate (LOPRESSOR) 25 MG tablet Take 12.5 mg by mouth 2 (two) times daily.     . Multiple Vitamin (MULTIVITAMIN WITH MINERALS) TABS Take 1 tablet by mouth every morning.    Marland Kitchen omeprazole (PRILOSEC) 20 MG capsule Take 20 mg by mouth every morning.     . potassium chloride SA (K-DUR,KLOR-CON) 20 MEQ tablet Take 20 mEq by mouth daily after supper.     . simvastatin (ZOCOR) 20 MG tablet Take 10 mg by mouth daily after supper.      No current facility-administered medications for this visit.    Allergies:   Review of patient's  allergies indicates no known allergies.    Social History:  The patient  reports that she has never smoked. She has never used smokeless tobacco. She reports that she does not drink alcohol or use illicit drugs.   Family History:  The patient's family history includes Cancer in her mother; Heart attack in her father.    ROS:  Please see the history of present illness.  She has unsteady gait, knee pain The patient specifically denies any chest pain at rest or with exertion, dyspnea at rest or with exertion, orthopnea, paroxysmal nocturnal dyspnea, syncope, palpitations, focal neurological deficits, intermittent claudication, lower extremity edema, unexplained weight gain, cough, hemoptysis or wheezing.  The patient also denies abdominal pain, nausea, vomiting, dysphagia, diarrhea, constipation, polyuria, polydipsia, dysuria, hematuria, frequency, urgency,  abnormal bleeding or bruising, fever, chills, unexpected weight changes, mood swings, change in skin or hair texture, change in voice quality, auditory or visual problems, allergic reactions or rashes, new musculoskeletal complaints other than usual "aches and pains".    All other systems are reviewed and negative.    PHYSICAL EXAM: VS:  BP 150/80 mmHg  Pulse 91  Resp 16  Ht 5\' 5"  (1.651 m)  Wt 183 lb (83.008 kg)  BMI 30.45 kg/m2 , BMI Body mass index is 30.45 kg/(m^2). Recheck BP 139/77 mm Hg General: Alert, oriented x3, no distress  Head: no evidence of trauma, PERRL, EOMI, no exophtalmos or lid lag, no myxedema, no xanthelasma; normal ears, nose and oropharynx  Neck: normal jugular venous pulsations and no hepatojugular reflux; brisk carotid pulses without delay and no carotid bruits  Chest: clear to auscultation, no signs of consolidation by percussion or palpation, normal fremitus, symmetrical and full respiratory excursions. Healthy appearance of the left subclavian pacemaker site  Cardiovascular: normal position and quality of  the apical impulse, irregular rhythm, normal first and second heart sounds, no murmurs, rubs or gallops  Abdomen: no tenderness or distention, no masses by palpation, no abnormal pulsatility or arterial bruits, normal bowel sounds, no hepatosplenomegaly  Extremities: no clubbing, cyanosis or edema; 2+ radial, ulnar and brachial pulses bilaterally; 2+ right femoral, posterior tibial and dorsalis pedis pulses; 2+ left femoral, posterior tibial and dorsalis pedis pulses; no subclavian or femoral bruits  Neurological: grossly nonfocal   EKG:  EKG is ordered today. The ekg ordered today demonstrates atrial fibrillation with occasional V pacing, mild lateral ST depression   Recent Labs: 08/23/2013: ALT 14; BUN 36*; Creatinine 1.7*; Potassium 4.9; Sodium 138 11/27/2013: Hemoglobin 12.5; Platelets 94*    Lipid Panel No results found for: CHOL, TRIG, HDL, CHOLHDL, VLDL, LDLCALC, LDLDIRECT    Wt Readings from Last 3 Encounters:  05/06/14 183 lb (83.008 kg)  11/27/13 185 lb (83.915 kg)  10/24/13 184 lb 14.4 oz (83.87 kg)     ASSESSMENT AND PLAN:  Permanent atrial fibrillation Conflicting benefits of more intense ventricular rate control. She will benefit from a slow heart rate because she has moderate obstruction across her mitral valve prosthesis. On the other hand excessive right ventricular pacing could lead to worsened diastolic function and congestive heart failure. I'm not sure where the balance of detriment/benefit lies with rate control medications. Will leave her on the very low dose of beta blocker that she is currently taking.  Pacemaker - single chamber She has not been compliant with remote downloads. It seems that she did not realize the device stopped transmitting after a power failure. Demonstrated how to reset the transmitter if it loses power.  HTN (hypertension) Excellent blood pressure control.  Chronic diastolic congestive heart failure She appears well compensated  today: NYHA functional class I.  We'll keep on the same dose of diuretic. Reminded her about the importance of sodium restriction and daily weight monitoring.  S/P mitral valve replacement with bioprosthetic valve - 29 mm Biocor Will be worthwhile repeating another echocardiogram periodically   Current medicines are reviewed at length with the patient today.  The patient does not haveconcerns regarding medicines.  The following changes have been made:  no change  Labs/ tests ordered today include:  Orders Placed This Encounter  Procedures  . EKG 12-Lead   Patient Instructions  Dr. Sallyanne Kuster recommends that you schedule a follow-up appointment in: 6 months      Signed, Sanda Klein, MD  05/06/2014 9:31 PM    Sanda Klein, MD, [facility] [phone] office [phone] pager

## 2014-05-06 NOTE — Patient Instructions (Signed)
Dr. Croitoru recommends that you schedule a follow-up appointment in: 6 months    

## 2014-05-07 LAB — MDC_IDC_ENUM_SESS_TYPE_REMOTE
Battery Remaining Longevity: 41 mo
Date Time Interrogation Session: 20160322184057
Lead Channel Pacing Threshold Amplitude: 0.875 V
Lead Channel Pacing Threshold Pulse Width: 0.4 ms
Lead Channel Sensing Intrinsic Amplitude: 5.6 mV
Lead Channel Setting Pacing Pulse Width: 0.4 ms
Lead Channel Setting Sensing Sensitivity: 2.8 mV
MDC IDC MSMT BATTERY IMPEDANCE: 1694 Ohm
MDC IDC MSMT BATTERY VOLTAGE: 2.75 V
MDC IDC MSMT LEADCHNL RA IMPEDANCE VALUE: 67 Ohm
MDC IDC MSMT LEADCHNL RV IMPEDANCE VALUE: 555 Ohm
MDC IDC SET LEADCHNL RV PACING AMPLITUDE: 2.5 V
MDC IDC STAT BRADY RV PERCENT PACED: 59 %

## 2014-05-12 ENCOUNTER — Other Ambulatory Visit: Payer: Self-pay | Admitting: Cardiovascular Disease

## 2014-05-12 NOTE — Telephone Encounter (Signed)
Rx refill sent to patient pharmacy   

## 2014-05-21 ENCOUNTER — Ambulatory Visit (HOSPITAL_BASED_OUTPATIENT_CLINIC_OR_DEPARTMENT_OTHER): Payer: Medicare Other | Admitting: Family

## 2014-05-21 ENCOUNTER — Encounter: Payer: Self-pay | Admitting: Family

## 2014-05-21 ENCOUNTER — Other Ambulatory Visit (HOSPITAL_BASED_OUTPATIENT_CLINIC_OR_DEPARTMENT_OTHER): Payer: Medicare Other

## 2014-05-21 VITALS — BP 129/75 | HR 75 | Temp 97.5°F | Resp 16 | Ht 65.0 in | Wt 184.0 lb

## 2014-05-21 DIAGNOSIS — D693 Immune thrombocytopenic purpura: Secondary | ICD-10-CM | POA: Diagnosis not present

## 2014-05-21 DIAGNOSIS — D696 Thrombocytopenia, unspecified: Secondary | ICD-10-CM

## 2014-05-21 LAB — CBC WITH DIFFERENTIAL (CANCER CENTER ONLY)
BASO#: 0.1 10*3/uL (ref 0.0–0.2)
BASO%: 1.3 % (ref 0.0–2.0)
EOS%: 5.3 % (ref 0.0–7.0)
Eosinophils Absolute: 0.2 10*3/uL (ref 0.0–0.5)
HEMATOCRIT: 38.1 % (ref 34.8–46.6)
HGB: 12.3 g/dL (ref 11.6–15.9)
LYMPH#: 1.3 10*3/uL (ref 0.9–3.3)
LYMPH%: 27.5 % (ref 14.0–48.0)
MCH: 31.1 pg (ref 26.0–34.0)
MCHC: 32.3 g/dL (ref 32.0–36.0)
MCV: 96 fL (ref 81–101)
MONO#: 0.6 10*3/uL (ref 0.1–0.9)
MONO%: 13.7 % — ABNORMAL HIGH (ref 0.0–13.0)
NEUT#: 2.4 10*3/uL (ref 1.5–6.5)
NEUT%: 52.2 % (ref 39.6–80.0)
Platelets: 98 10*3/uL — ABNORMAL LOW (ref 145–400)
RBC: 3.96 10*6/uL (ref 3.70–5.32)
RDW: 14.9 % (ref 11.1–15.7)
WBC: 4.5 10*3/uL (ref 3.9–10.0)

## 2014-05-21 LAB — CHCC SATELLITE - SMEAR

## 2014-05-21 NOTE — Progress Notes (Signed)
Hematology and Oncology Follow Up Visit  [name] [id number] Mar 07, 1929 81 y.o. 05/21/2014   Principle Diagnosis:  Chronic immune thrombocytopenia-mild  Current Therapy:   Observation    Interim History:  [name] is here today with her husband for a follow-up. She is doing well and has no complaints at this time.  She has had no problems with infections. No bruising or episodes of bleeding. She denies fever, chills, n/v, cough, rash, dizziness, headache, blurred vision, SOB, chest pain, palpitations, abdominal pain, constipation, diarrhea, blood in urine or stool.  No swelling, tenderness, numbness or tingling in her extremities. No new aches or pains.  Her appetite is good and she is staying hydrated. Her weight is stable.  She has had no changes in her medications.   Medications:    Medication List       This list is accurate as of: 05/21/14  1:25 PM.  Always use your most recent med list.               ALPRAZolam 0.5 MG tablet  Commonly known as:  XANAX  Take 0.5 mg by mouth at bedtime.     amLODipine-olmesartan 5-20 MG per tablet  Commonly known as:  AZOR  Take 1 tablet by mouth daily.     aspirin EC 81 MG tablet  Take 81 mg by mouth every morning.     furosemide 40 MG tablet  Commonly known as:  LASIX  Take 40 mg by mouth every morning.     HYDROcodone-acetaminophen 5-325 MG per tablet  Commonly known as:  NORCO  Take 1 tablet by mouth every 6 (six) hours as needed for moderate pain.     metoprolol tartrate 25 MG tablet  Commonly known as:  LOPRESSOR  Take 12.5 mg by mouth 2 (two) times daily.     multivitamin with minerals Tabs tablet  Take 1 tablet by mouth every morning.     omeprazole 20 MG capsule  Commonly known as:  PRILOSEC  Take 20 mg by mouth every morning.     potassium chloride SA 20 MEQ tablet  Commonly known as:  K-DUR,KLOR-CON  Take 20 mEq by mouth daily after supper.     simvastatin 20 MG tablet  Commonly known as:  ZOCOR    Take 10 mg by mouth daily after supper.     VITAMIN B COMPLEX PO  Take 1 tablet by mouth every morning.        Allergies: No Known Allergies  Past Medical History, Surgical history, Social history, and Family History were reviewed and updated.  Review of Systems: All other 10 point review of systems is negative.   Physical Exam:  height is 5\' 5"  (1.651 m) and weight is 184 lb (83.462 kg). Her oral temperature is 97.5 F (36.4 C). Her blood pressure is 129/75 and her pulse is 75. Her respiration is 16.   Wt Readings from Last 3 Encounters:  05/21/14 184 lb (83.462 kg)  05/06/14 183 lb (83.008 kg)  11/27/13 185 lb (83.915 kg)    Ocular: Sclerae unicteric, pupils equal, round and reactive to light Ear-nose-throat: Oropharynx clear, dentition fair Lymphatic: No cervical or supraclavicular adenopathy Lungs no rales or rhonchi, good excursion bilaterally Heart regular rate and rhythm, no murmur appreciated Abd soft, nontender, positive bowel sounds MSK no focal spinal tenderness, no joint edema Neuro: non-focal, well-oriented, appropriate affect Breasts:Deferred  Lab Results  Component Value Date   WBC 4.5 05/21/2014   HGB 12.3 05/21/2014  HCT 38.1 05/21/2014   MCV 96 05/21/2014   PLT 98 Platelet count consistent in citrate* 05/21/2014   No results found for: FERRITIN, IRON, TIBC, UIBC, IRONPCTSAT Lab Results  Component Value Date   RETICCTPCT 1.0 12/13/2010   RBC 3.96 05/21/2014   RETICCTABS 43.4 12/13/2010   No results found for: KPAFRELGTCHN, LAMBDASER, KAPLAMBRATIO No results found for: Osborne Casco Lab Results  Component Value Date   TOTALPROTELP 6.3 11/16/2010   ALBUMINELP 61.0 11/16/2010   A1GS 4.8 11/16/2010   A2GS 10.7 11/16/2010   BETS 7.8* 11/16/2010   BETA2SER 4.8 11/16/2010   GAMS 10.9* 11/16/2010   MSPIKE NOT DET 11/16/2010   SPEI * 11/16/2010     Chemistry      Component Value Date/Time   NA 138 08/23/2013 1103   K 4.9  08/23/2013 1103   CL 103 08/23/2013 1103   CO2 28 08/23/2013 1103   BUN 36* 08/23/2013 1103   CREATININE 1.7* 08/23/2013 1103      Component Value Date/Time   CALCIUM 10.7* 08/23/2013 1103   ALKPHOS 46 08/23/2013 1103   AST 27 08/23/2013 1103   ALT 14 08/23/2013 1103   BILITOT 0.6 08/23/2013 1103     Impression and Plan: [name] is a 81 year old female with chronic immune thrombocytopenia. This has not caused her any problems and she has had no episodes of bleeding/bruising. She is doing well and is asymptomatic at this time.  Her platelet count today is up to 98.  We will see her back in 6 months for labs and follow-up.  She knows to call here with any questions or concerns. We can certainly see her sooner if need be.    Eliezer Bottom, NP 4/6/20161:25 PM

## 2014-11-05 ENCOUNTER — Encounter: Payer: Self-pay | Admitting: Cardiovascular Disease

## 2014-11-05 ENCOUNTER — Ambulatory Visit (INDEPENDENT_AMBULATORY_CARE_PROVIDER_SITE_OTHER): Payer: Medicare Other | Admitting: Cardiovascular Disease

## 2014-11-05 VITALS — BP 148/75 | HR 84 | Resp 16 | Ht 65.0 in | Wt 180.8 lb

## 2014-11-05 DIAGNOSIS — Z953 Presence of xenogenic heart valve: Secondary | ICD-10-CM

## 2014-11-05 DIAGNOSIS — Z950 Presence of cardiac pacemaker: Secondary | ICD-10-CM

## 2014-11-05 DIAGNOSIS — I480 Paroxysmal atrial fibrillation: Secondary | ICD-10-CM

## 2014-11-05 DIAGNOSIS — I4821 Permanent atrial fibrillation: Secondary | ICD-10-CM

## 2014-11-05 DIAGNOSIS — I482 Chronic atrial fibrillation: Secondary | ICD-10-CM | POA: Diagnosis not present

## 2014-11-05 DIAGNOSIS — I10 Essential (primary) hypertension: Secondary | ICD-10-CM

## 2014-11-05 DIAGNOSIS — Z79899 Other long term (current) drug therapy: Secondary | ICD-10-CM

## 2014-11-05 DIAGNOSIS — E785 Hyperlipidemia, unspecified: Secondary | ICD-10-CM

## 2014-11-05 DIAGNOSIS — I5032 Chronic diastolic (congestive) heart failure: Secondary | ICD-10-CM

## 2014-11-05 NOTE — Patient Instructions (Signed)
Medication Instructions:   INCREASE FUROSEMIDE TO 80MG  DAILY AND POTASSIUM 40MEQ DAILY UNTIL YOU ARE DOWN 8 LBS FROM TODAYS WEIGHT.  THEN, RESUME YOUR REGULAR DOSE.  Labwork:  BMP IN 1 WEEK   Follow-Up:  6 MONTHS  Any Other Special Instructions Will Be Listed Below (If Applicable).  CALL WITH YOUR HOME WEIGHT IN ONE WEEK

## 2014-11-05 NOTE — Progress Notes (Signed)
Patient ID: [name], female   DOB: 1929/04/02, 81 y.o.   MRN: [id number]     Cardiology Office Note   Date:  11/05/2014   ID:  [name], DOB 10-19-1929, MRN [id number]  PCP:   Melinda Crutch, MD  Cardiologist:   Sanda Klein, MD   Chief Complaint  Patient presents with  . DEVICE CHECK    No complaints of chest pain, SOB or dizziness.  Occas. ankle edema.      History of Present Illness: [name] is a 81 y.o. female who presents for  Permanent atrial fibrillation, pacemaker check, valvular heart disease status post mitral valve biological prosthesis , worsening lower extremity edema.  She has a history of mitral valve replacement (29 mm Biocor, Dr. Prescott Gum, 2011), permanent atrial fibrillation status post pacemaker implantation (dual chamber Medtronic device programmed VVIR), status post atrial appendage ligation, recurrent nephrolithiasis, hypertension and hyperlipidemia. Cardiac catheterization in 2011 showed normal coronary arteries. She is not on anticoagulation due to frequent falls. She has mild immune mediated thrombocytopenia.  Her last echocardiogram in 2014 shows moderately elevated gradients across her biological prosthesis, but not changed from the previous echo in 2011. She has normal left ventricular systolic function.  Over the last several weeks she has had worsening edema that now reaches almost up to her knees. Despite this, her weight is actually lower than it was in the spring by roughly 3 pounds. However, compared with 2014 she weighs about 10 pounds more. She is quite sedentary, but has not noticed any real change in her overall exertional tolerance.   She has not had any recent bleeding complications. She is felt to be at high risk for injury secondary to falls and general frailty and therefore is not on anticoagulation. She also has chronic mild immune mediated thrombocytopenia.  Interrogation of her pacemaker shows normal function. Her Medtronic  Adapta device was implanted in January 2009 and has roughly another 2.5 years of estimated longevity. The device is programmed VVIR for permanent atrial fibrillation. There is roughly 63%  ventricular pacing. The episodes of high ventricular rates are very rare.  All of them are irregular although it cannot be excluded the some of them represent brief nonsustained ventricular tachycardia. Lead parameters are good.    Past Medical History  Diagnosis Date  . CHF (congestive heart failure)   . Edema of both legs 04-23-2013  pt states feet swelling (wears ted hose)    bilateral venous ablation procedures done in IR, L>R  . Hypertension   . Venous insufficiency, peripheral   . H/O hiatal hernia   . Anxiety   . Depression   . Arthritis   . Permanent atrial fibrillation     CARDIOLOGIST-- DR [id number]  . Sick sinus syndrome     S/P  PACEMAKER  2009  . S/P mitral valve replacement with bioprosthetic valve     04-03-2009  . S/P cardiac pacemaker procedure     LAST PACER CHECK  04-23-2013  IN [facility]  . History of kidney stones   . Right ureteral stone   . Thrombocytopenia, immune     CHRONIC---  HEMOTOLOGIST---  DR ENNEVER  . Osteopenia   . Benign essential tremor   . Schatzki's ring   . Hyperparathyroidism     MILD--  STABLE  . Hyperlipidemia   . GERD (gastroesophageal reflux disease)   . History of basal cell carcinoma excision     [place]  . Wears glasses  Past Surgical History  Procedure Laterality Date  . Hand surgery Right 05/2011  . Permanent pacemaker insertion  02/26/2007    Dual Chamber Medtronic Programmed VVIR--- Generator [id number]/  #[id number]   . Cystoscopy w/ ureteral stent placement Left 08/01/2012    Procedure: CYSTOSCOPY WITH RETROGRADE PYELOGRAM/URETERAL STENT PLACEMENT;  Surgeon: Dutch Gray, MD;  Location: [facility];  Service: Urology;  Laterality: Left;  . Cystoscopy with ureteroscopy Left 08/14/2012    Procedure: LEFT URETEROSCOPY WITH HOLMIUM LASER AND LEFT   STENT PLACEMENT;  Surgeon: Malka So, MD;  Location: [facility];  Service: Urology;  Laterality: Left;  . Holmium laser application Left 07/22/3417    Procedure:  HOLMIUM LASER APPLICATION;  Surgeon: Malka So, MD;  Location: [facility];  Service: Urology;  Laterality: Left;  . Nm myocar perf wall motion  03/01/1999    mild lateral ischemia  . Mitral valve replacement with chordal presevation using bioprosthetic valve/  ligation left atrial appendage/  left-sided maze procedure  04-03-2009  DR Ethel Rana,  SERIAL # [id number]  . Transthoracic echocardiogram  09-07-2012  DR CROITORU    MILD LVH/  EF 55-60%/  MILD TO MODERATE AR/   SEVERE LAE  &  RAE/  MILD TO MODERATE TR/   MITRAL VALVE BIOPROSTHESIS LEAFLET NORMAL,  MODERATE STENOSIS WITH NO SIG. REGURG/  MODERATE RVE  . Cardiac catheterization  03/27/2009    normal coronaries; grade IV Mitral Insuff.  . Cardiac catheterization  2001  &  2006  . Vaginal hysterectomy  1982  . Extracorporeal shock wave lithotripsy Left 07-26-2012  . Cystoscopy with ureteroscopy and stent placement Right 04/25/2013    Procedure: RIGHT URETEROSCOPY WITH  STENT PLACEMENT;  Surgeon: Irine Seal, MD;  Location: [facility];  Service: Urology;  Laterality: Right;  . Holmium laser application Right 03/28/9415    Procedure: HOLMIUM LASER APPLICATION;  Surgeon: Irine Seal, MD;  Location: [facility];  Service: Urology;  Laterality: Right;     Current Outpatient Prescriptions  Medication Sig Dispense Refill  . ALPRAZolam (XANAX) 0.5 MG tablet Take 0.5 mg by mouth at bedtime.    Marland Kitchen amLODipine-olmesartan (AZOR) 5-20 MG per tablet Take 1 tablet by mouth daily. 21 tablet 0  . aspirin EC 81 MG tablet Take 81 mg by mouth every morning.    . B Complex Vitamins (VITAMIN B COMPLEX PO) Take 1 tablet by mouth every morning.     . furosemide (LASIX) 40 MG tablet Take 40 mg by mouth every morning.     Marland Kitchen HYDROcodone-acetaminophen (NORCO) 5-325 MG per  tablet Take 1 tablet by mouth every 6 (six) hours as needed for moderate pain. 30 tablet 0  . metoprolol tartrate (LOPRESSOR) 25 MG tablet Take 12.5 mg by mouth 2 (two) times daily.     . Multiple Vitamin (MULTIVITAMIN WITH MINERALS) TABS Take 1 tablet by mouth every morning.    Marland Kitchen omeprazole (PRILOSEC) 20 MG capsule Take 20 mg by mouth every morning.     . potassium chloride SA (K-DUR,KLOR-CON) 20 MEQ tablet Take 20 mEq by mouth daily after supper.     . simvastatin (ZOCOR) 20 MG tablet Take 10 mg by mouth daily after supper.      No current facility-administered medications for this visit.    Allergies:   Review of patient's allergies indicates no known allergies.    Social History:  The patient  reports that she has never smoked. She has  never used smokeless tobacco. She reports that she does not drink alcohol or use illicit drugs.   Family History:  The patient's family history includes Cancer in her mother; Heart attack in her father.    ROS:  Please see the history of present illness.    Otherwise, review of systems positive for  Fatigue, slow unsteady gait , frequent stumbles and occasional falls..   All other systems are reviewed and negative.    PHYSICAL EXAM: VS:  BP 148/75 mmHg  Pulse 84  Resp 16  Ht 5\' 5"  (1.651 m)  Wt 180 lb 12.8 oz (82.01 kg)  BMI 30.09 kg/m2 , BMI Body mass index is 30.09 kg/(m^2).  General: Alert, oriented x3, no distress Head: no evidence of trauma, PERRL, EOMI, no exophtalmos or lid lag, no myxedema, no xanthelasma; normal ears, nose and oropharynx Neck: normal jugular venous pulsations and no hepatojugular reflux; brisk carotid pulses without delay and no carotid bruits Chest: clear to auscultation, no signs of consolidation by percussion or palpation, normal fremitus, symmetrical and full respiratory excursions Cardiovascular: normal position and quality of the apical impulse, irregular rhythm, normal first and second heart sounds, no  diastolic  murmurs, rubs or gallops Abdomen: no tenderness or distention, no masses by palpation, no abnormal pulsatility or arterial bruits, normal bowel sounds, no hepatosplenomegaly Extremities: no clubbing, cyanosis or edema; 2+ radial, ulnar and brachial pulses bilaterally; 2+ right femoral, posterior tibial and dorsalis pedis pulses; 2+ left femoral, posterior tibial and dorsalis pedis pulses; no subclavian or femoral bruits Neurological: grossly nonfocal Psych: euthymic mood, full affect   EKG:  EKG is ordered today. The ekg ordered today demonstrates  Atrial fibrillation with intermittent ventricular pacing   Recent Labs: 05/21/2014: HGB 12.3; Platelets 98 Platelet count consistent in citrate*    Lipid Panel No results found for: CHOL, TRIG, HDL, CHOLHDL, VLDL, LDLCALC, LDLDIRECT    Wt Readings from Last 3 Encounters:  11/05/14 180 lb 12.8 oz (82.01 kg)  05/21/14 184 lb (83.462 kg)  05/06/14 183 lb (83.008 kg)      Other studies Reviewed: Additional studies/ records that were reviewed today include:  Notes from hematologist.   ASSESSMENT AND PLAN:  1.  Acute on chronic diastolic heart failure. By physical exam and review of previous weights, I would estimate that Mrs. Johny Chess is roughly 8-10 pounds over her optimal "dry weight". Have asked her to increase her furosemide to 80 mg daily and also increase her potassium supplement 40 mEq daily until she loses 8 pounds. We will check laboratory tests in about a week's time in touch base with her at that time about her weight and her symptoms. Plan to decrease the dose of furosemide back to usual once she reaches that target weight. Reviewed the importance of sodium restriction and daily weight monitoring.   2.  Permanent atrial fibrillation with slow ventricular response. She remains a poor candidate for anticoagulation due to frequent falls and high risk for injury, advanced age and frailty. She is on a tiny dose of beta blocker that I would  continue since she may be having some spells of nonsustained ventricular tachycardia.  Again it is difficult to know exactly where the balance lies between bradycardia with excessive ventricular pacing versus benefit of better filling via her mitral valve prosthesis  3.   Valvular heart disease status post mitral valve replacement with biological prosthesis.  We'll try diuretics first but may have to reimage her valve with echocardiography if the edema persists.  I doubt should be a good candidate for any type of intervention even if there is degeneration of her mitral valve. Conservative therapy is definitely first choice  4.  Essential hypertension, satisfactory control  5. Normally functioning permanent pacemaker    Current medicines are reviewed at length with the patient today.  The patient does not have concerns regarding medicines.  The following changes have been made:   Increase furosemide to 80 mg daily and increase potassium chloride to 40 mEq daily until 8 pounds lighter, then retreat to previous dose. Check labs in 1 week  Labs/ tests ordered today include:  Orders Placed This Encounter  Procedures  . Basic metabolic panel  . EKG 12-Lead     Patient Instructions  Medication Instructions:   INCREASE FUROSEMIDE TO 80MG  DAILY AND POTASSIUM 40MEQ DAILY UNTIL YOU ARE DOWN 8 LBS FROM TODAYS WEIGHT.  THEN, RESUME YOUR REGULAR DOSE.  Labwork:  BMP IN 1 WEEK   Follow-Up:  6 MONTHS  Any Other Special Instructions Will Be Listed Below (If Applicable).  CALL WITH [facility] WEIGHT IN ONE WEEK       Signed, Sanda Klein, MD  11/05/2014 9:35 PM    Sanda Klein, MD, [facility] [phone] office [phone] pager

## 2014-11-12 ENCOUNTER — Telehealth: Payer: Self-pay | Admitting: *Deleted

## 2014-11-12 NOTE — Telephone Encounter (Signed)
Patient came in to update [place] on weight - was 163 lbs today. Seen 1 week ago and advised to recheck weight after med changes. No other concerns. Her weight at clinic visit on 9/21 was 180 lbs. Unsure of accuracy of home scale vs clinic scale  Patient also has BMP in process.  Will route to Dr. Sallyanne Kuster.

## 2014-11-12 NOTE — Telephone Encounter (Signed)
Sounds like an improvement. Will review BMET

## 2014-11-13 LAB — BASIC METABOLIC PANEL
BUN: 42 mg/dL — ABNORMAL HIGH (ref 7–25)
CALCIUM: 11.1 mg/dL — AB (ref 8.6–10.4)
CO2: 29 mmol/L (ref 20–31)
Chloride: 103 mmol/L (ref 98–110)
Creat: 1.61 mg/dL — ABNORMAL HIGH (ref 0.60–0.88)
Glucose, Bld: 101 mg/dL — ABNORMAL HIGH (ref 65–99)
Potassium: 4.7 mmol/L (ref 3.5–5.3)
Sodium: 141 mmol/L (ref 135–146)

## 2014-11-19 ENCOUNTER — Encounter: Payer: Self-pay | Admitting: Hematology & Oncology

## 2014-11-19 ENCOUNTER — Ambulatory Visit (HOSPITAL_BASED_OUTPATIENT_CLINIC_OR_DEPARTMENT_OTHER): Payer: Medicare Other | Admitting: Hematology & Oncology

## 2014-11-19 ENCOUNTER — Other Ambulatory Visit (HOSPITAL_BASED_OUTPATIENT_CLINIC_OR_DEPARTMENT_OTHER): Payer: Medicare Other

## 2014-11-19 VITALS — BP 126/56 | HR 78 | Temp 97.6°F | Resp 16 | Ht 65.0 in | Wt 167.0 lb

## 2014-11-19 DIAGNOSIS — D696 Thrombocytopenia, unspecified: Secondary | ICD-10-CM

## 2014-11-19 LAB — CBC WITH DIFFERENTIAL (CANCER CENTER ONLY)
BASO#: 0.1 10*3/uL (ref 0.0–0.2)
BASO%: 0.9 % (ref 0.0–2.0)
EOS%: 3.3 % (ref 0.0–7.0)
Eosinophils Absolute: 0.2 10*3/uL (ref 0.0–0.5)
HCT: 41.5 % (ref 34.8–46.6)
HEMOGLOBIN: 13.3 g/dL (ref 11.6–15.9)
LYMPH#: 1.5 10*3/uL (ref 0.9–3.3)
LYMPH%: 27.6 % (ref 14.0–48.0)
MCH: 30.7 pg (ref 26.0–34.0)
MCHC: 32 g/dL (ref 32.0–36.0)
MCV: 96 fL (ref 81–101)
MONO#: 0.7 10*3/uL (ref 0.1–0.9)
MONO%: 12.2 % (ref 0.0–13.0)
NEUT#: 3.1 10*3/uL (ref 1.5–6.5)
NEUT%: 56 % (ref 39.6–80.0)
RBC: 4.33 10*6/uL (ref 3.70–5.32)
RDW: 14.3 % (ref 11.1–15.7)
WBC: 5.5 10*3/uL (ref 3.9–10.0)

## 2014-11-19 LAB — CHCC SATELLITE - SMEAR

## 2014-11-19 NOTE — Progress Notes (Signed)
Hematology and Oncology Follow Up Visit  [name] [id number] [birth year] 81 y.o. 11/19/2014   Principle Diagnosis:   Chronic immune thrombocytopenia-mild  Current Therapy:    Observation     Interim History:  Ms.  Wilkinson is back for followup. We typically see her every 6 months. She is doing well. She's had no problems bleeding. There's been no bruising. She's had no change in her medications.  She's had no cough. There's been no change in bowel or bladder habits. She's had a good appetite. She's had no nausea or vomiting.  She is worried about some weight gain.  Medications:  Current outpatient prescriptions:  .  ALPRAZolam (XANAX) 0.5 MG tablet, Take 0.5 mg by mouth at bedtime., Disp: , Rfl:  .  amLODipine-olmesartan (AZOR) 5-20 MG per tablet, Take 1 tablet by mouth daily., Disp: 21 tablet, Rfl: 0 .  aspirin EC 81 MG tablet, Take 81 mg by mouth every morning., Disp: , Rfl:  .  B Complex Vitamins (VITAMIN B COMPLEX PO), Take 1 tablet by mouth every morning. , Disp: , Rfl:  .  furosemide (LASIX) 40 MG tablet, Take 40 mg by mouth every morning. , Disp: , Rfl:  .  HYDROcodone-acetaminophen (NORCO) 5-325 MG per tablet, Take 1 tablet by mouth every 6 (six) hours as needed for moderate pain., Disp: 30 tablet, Rfl: 0 .  metoprolol tartrate (LOPRESSOR) 25 MG tablet, Take 12.5 mg by mouth 2 (two) times daily. , Disp: , Rfl:  .  Multiple Vitamin (MULTIVITAMIN WITH MINERALS) TABS, Take 1 tablet by mouth every morning., Disp: , Rfl:  .  omeprazole (PRILOSEC) 20 MG capsule, Take 20 mg by mouth every morning. , Disp: , Rfl:  .  potassium chloride SA (K-DUR,KLOR-CON) 20 MEQ tablet, Take 20 mEq by mouth daily after supper. , Disp: , Rfl:  .  simvastatin (ZOCOR) 20 MG tablet, Take 10 mg by mouth daily after supper. , Disp: , Rfl:   Allergies: No Known Allergies  Past Medical History, Surgical history, Social history, and Family History were reviewed and updated.  Review of Systems: As  above  Physical Exam:  height is 5\' 5"  (1.651 m) and weight is 167 lb (75.751 kg). Her oral temperature is 97.6 F (36.4 C). Her blood pressure is 126/56 and her pulse is 78. Her respiration is 16.   Elderly white female. Head and neck exam shows no ocular or oral lesions. She has no palpable cervical or supraclavicular lymph nodes. Lungs are clear. Cardiac exam regular rate and rhythm with no murmurs, rubs or bruits. Abdomen is soft. She has good bowel sounds. There is no fluid wave. There is no palpable liver or spleen tip. Back exam shows no tenderness over the spine, ribs or hips. Extremities shows significant rheumatoid arthritic changes. Skin exam shows no rashes, ecchymosis or petechia.Neurological exam shows no focal deficits.  Lab Results  Component Value Date   WBC 5.5 11/19/2014   HGB 13.3 11/19/2014   HCT 41.5 11/19/2014   MCV 96 11/19/2014   PLT * 11/19/2014    79 Platelet count consistent in citrate, large plts     Chemistry      Component Value Date/Time   NA 141 11/12/2014 0914   K 4.7 11/12/2014 0914   CL 103 11/12/2014 0914   CO2 29 11/12/2014 0914   BUN 42* 11/12/2014 0914   CREATININE 1.61* 11/12/2014 0914   CREATININE 1.7* 08/23/2013 1103      Component Value Date/Time  CALCIUM 11.1* 11/12/2014 0914   ALKPHOS 46 08/23/2013 1103   AST 27 08/23/2013 1103   ALT 14 08/23/2013 1103   BILITOT 0.6 08/23/2013 1103         Impression and Plan: [name] is a 81 year old female. She has mild, non- clinical thrombocytopenia. She has never had a problem with this.  Her platelet count is a little bit lower. As such, I probably want to see her back in 3 months just to follow-up with her.     Volanda Napoleon, MD 10/5/20161:26 PM

## 2014-11-25 LAB — CUP PACEART INCLINIC DEVICE CHECK
Battery Impedance: 2019 Ohm
Brady Statistic RV Percent Paced: 63 %
Lead Channel Impedance Value: 544 Ohm
Lead Channel Impedance Value: 67 Ohm
Lead Channel Setting Pacing Amplitude: 2.5 V
Lead Channel Setting Pacing Pulse Width: 0.4 ms
Lead Channel Setting Sensing Sensitivity: 2.8 mV
MDC IDC MSMT BATTERY REMAINING LONGEVITY: 33 mo
MDC IDC MSMT BATTERY VOLTAGE: 2.76 V
MDC IDC MSMT LEADCHNL RV PACING THRESHOLD AMPLITUDE: 0.625 V
MDC IDC MSMT LEADCHNL RV PACING THRESHOLD PULSEWIDTH: 0.4 ms
MDC IDC MSMT LEADCHNL RV SENSING INTR AMPL: 5.6 mV
MDC IDC SESS DTM: 20160921181048

## 2014-11-27 ENCOUNTER — Other Ambulatory Visit: Payer: Self-pay | Admitting: Family Medicine

## 2014-11-27 DIAGNOSIS — E041 Nontoxic single thyroid nodule: Secondary | ICD-10-CM

## 2014-12-01 ENCOUNTER — Ambulatory Visit
Admission: RE | Admit: 2014-12-01 | Discharge: 2014-12-01 | Disposition: A | Discharge status: Home / Self Care | Payer: Medicare Other | Source: Ambulatory Visit | Attending: Family Medicine | Admitting: Family Medicine

## 2014-12-01 DIAGNOSIS — E041 Nontoxic single thyroid nodule: Secondary | ICD-10-CM

## 2014-12-09 ENCOUNTER — Encounter: Payer: Self-pay | Admitting: Cardiovascular Disease

## 2015-01-15 ENCOUNTER — Other Ambulatory Visit (HOSPITAL_COMMUNITY): Payer: Self-pay | Admitting: Surgery

## 2015-01-15 DIAGNOSIS — E041 Nontoxic single thyroid nodule: Secondary | ICD-10-CM

## 2015-01-19 ENCOUNTER — Ambulatory Visit (HOSPITAL_COMMUNITY)
Admission: RE | Admit: 2015-01-19 | Discharge: 2015-01-19 | Disposition: A | Discharge status: Home / Self Care | Payer: Medicare Other | Source: Ambulatory Visit | Attending: Surgery | Admitting: Surgery

## 2015-01-19 ENCOUNTER — Other Ambulatory Visit (HOSPITAL_COMMUNITY): Payer: Self-pay | Admitting: Surgery

## 2015-01-19 DIAGNOSIS — E041 Nontoxic single thyroid nodule: Secondary | ICD-10-CM | POA: Diagnosis not present

## 2015-01-19 MED ORDER — LIDOCAINE HCL (PF) 1 % IJ SOLN
INTRAMUSCULAR | Status: AC
  Filled 2015-01-19: qty 10

## 2015-01-19 NOTE — Procedures (Signed)
Interventional Radiology Procedure Note  Procedure: US guided left neck nodule biopsy, with quickstain, then US guided right thyroid nodule biospy..  Complications: none Recommendations:  - Ok to shower tomorrow - Do not submerge for 7 days - Routine line care   Signed,  Dulcy Fanny. Earleen Newport, DO

## 2015-01-26 ENCOUNTER — Emergency Department (HOSPITAL_COMMUNITY)
Admission: EM | Admit: 2015-01-26 | Discharge: 2015-01-26 | Disposition: A | Discharge status: Home / Self Care | Payer: Medicare Other | Source: Home / Self Care | Attending: Emergency Medicine | Admitting: Emergency Medicine

## 2015-01-26 ENCOUNTER — Other Ambulatory Visit (HOSPITAL_COMMUNITY): Payer: Self-pay | Admitting: Surgery

## 2015-01-26 ENCOUNTER — Encounter (HOSPITAL_COMMUNITY): Payer: Self-pay | Admitting: Family Medicine

## 2015-01-26 ENCOUNTER — Emergency Department (HOSPITAL_COMMUNITY): Payer: Medicare Other

## 2015-01-26 DIAGNOSIS — S0093XA Contusion of unspecified part of head, initial encounter: Secondary | ICD-10-CM

## 2015-01-26 DIAGNOSIS — W19XXXA Unspecified fall, initial encounter: Secondary | ICD-10-CM

## 2015-01-26 DIAGNOSIS — E210 Primary hyperparathyroidism: Secondary | ICD-10-CM

## 2015-01-26 DIAGNOSIS — E042 Nontoxic multinodular goiter: Secondary | ICD-10-CM

## 2015-01-26 DIAGNOSIS — J189 Pneumonia, unspecified organism: Secondary | ICD-10-CM

## 2015-01-26 DIAGNOSIS — R062 Wheezing: Secondary | ICD-10-CM | POA: Diagnosis not present

## 2015-01-26 MED ORDER — AZITHROMYCIN 250 MG PO TABS: 250.0000 mg | ORAL_TABLET | Freq: Every day | ORAL | Status: DC

## 2015-01-26 MED ORDER — AZITHROMYCIN 250 MG PO TABS
500.0000 mg | ORAL_TABLET | Freq: Once | ORAL | Status: AC
  Administered 2015-01-26: 500 mg via ORAL
  Filled 2015-01-26: qty 2

## 2015-01-26 NOTE — Discharge Instructions (Signed)
Community-Acquired Pneumonia, Adult Follow-up with your primary care physician within 48 hours for recheck. Return for fever, no improvement, or shortness of breath. Pneumonia is an infection of the lungs. One type of pneumonia can happen while a person is in a hospital. A different type can happen when a person is not in a hospital (community-acquired pneumonia). It is easy for this kind to spread from person to person. It can spread to you if you breathe near an infected person who coughs or sneezes. Some symptoms include:  A dry cough.  A wet (productive) cough.  Fever.  Sweating.  Chest pain. HOME CARE  Take over-the-counter and prescription medicines only as told by your doctor.  Only take cough medicine if you are losing sleep.  If you were prescribed an antibiotic medicine, take it as told by your doctor. Do not stop taking the antibiotic even if you start to feel better.  Sleep with your head and neck raised (elevated). You can do this by putting a few pillows under your head, or you can sleep in a recliner.  Do not use tobacco products. These include cigarettes, chewing tobacco, and e-cigarettes. If you need help quitting, ask your doctor.  Drink enough water to keep your pee (urine) clear or pale yellow. A shot (vaccine) can help prevent pneumonia. Shots are often suggested for:  People older than 79 years of age.  People older than 79 years of age:  Who are having cancer treatment.  Who have long-term (chronic) lung disease.  Who have problems with their body's defense system (immune system). You may also prevent pneumonia if you take these actions:  Get the flu (influenza) shot every year.  Go to the dentist as often as told.  Wash your hands often. If soap and water are not available, use hand sanitizer. GET HELP IF:  You have a fever.  You lose sleep because your cough medicine does not help. GET HELP RIGHT AWAY IF:  You are short of breath and it gets  worse.  You have more chest pain.  Your sickness gets worse. This is very serious if:  You are an older adult.  Your body's defense system is weak.  You cough up blood.   This information is not intended to replace advice given to you by your health care provider. Make sure you discuss any questions you have with your health care provider.   Document Released: 07/20/2007 Document Revised: 10/22/2014 Document Reviewed: 05/28/2014 Elsevier Interactive Patient Education [employer].

## 2015-01-26 NOTE — ED Provider Notes (Signed)
CSN: [id number]     Arrival date & time 01/26/15  1641 History  By signing my name below, I, Eustaquio Maize, attest that this documentation has been prepared under the direction and in the presence of [employer], PA-C. Electronically Signed: Eustaquio Maize, ED Scribe. 01/26/2015. 5:59 PM.   Chief Complaint  Patient presents with  . Fall   The history is provided by the patient, the spouse and a relative. No language interpreter was used.     HPI Comments: [name] is a 81 y.o. female with a history of CHF, hypertension, hyperparathyroidism, and pacemaker, brought in by ambulance, who presents to the Emergency Department complaining of sudden onset, constant, moderate, left occiput pain s/p unwitnessed ground level fall that occurred earlier today. Pt states that she got out of bed to fast to answer her telephone when she fell, landing onto hardwood floor. No LOC. Pt could not get up from the floor after the fall due to her knees being chronically weak, prompting husband to call EMS. EMS advised pt to come to the ED to be evaluated. Denies any other associated symptoms.  She reports similar fall one other time when her knees gave out on her. Patient states she takes aspirin 81 mg. Sharol Given reports that she has had a nonproductive cough recently and was seen yesterday at her PCP. She denies having a chest x-ray done and states she was not put on antibiotics. She was told to take Mucinex and lemon juice with honey. Daughter states that her cough has not improved. Says that her mom is at her baseline.  Past Medical History  Diagnosis Date  . CHF (congestive heart failure) ([place])   . Edema of both legs 04-23-2013  pt states feet swelling (wears ted hose)    bilateral venous ablation procedures done in IR, L>R  . Hypertension   . Venous insufficiency, peripheral   . H/O hiatal hernia   . Anxiety   . Depression   . Arthritis   . Permanent atrial fibrillation ([place])     CARDIOLOGIST-- DR  [id number]  . Sick sinus syndrome ([place])     S/P  PACEMAKER  2009  . S/P mitral valve replacement with bioprosthetic valve     04-03-2009  . S/P cardiac pacemaker procedure     LAST PACER CHECK  04-23-2013  IN [facility]  . History of kidney stones   . Right ureteral stone   . Thrombocytopenia, immune     CHRONIC---  HEMOTOLOGIST---  DR ENNEVER  . Osteopenia   . Benign essential tremor   . Schatzki's ring   . Hyperparathyroidism ([place])     MILD--  STABLE  . Hyperlipidemia   . GERD (gastroesophageal reflux disease)   . History of basal cell carcinoma excision     foredhead  . Wears glasses    Past Surgical History  Procedure Laterality Date  . Hand surgery Right 05/2011  . Permanent pacemaker insertion  02/26/2007    Dual Chamber [employer] Programmed VVIR--- Generator [id number]/  [id number]   . Cystoscopy w/ ureteral stent placement Left 08/01/2012    Procedure: CYSTOSCOPY WITH RETROGRADE PYELOGRAM/URETERAL STENT PLACEMENT;  Surgeon: Dutch Gray, MD;  Location: [facility];  Service: Urology;  Laterality: Left;  . Cystoscopy with ureteroscopy Left 08/14/2012    Procedure: LEFT URETEROSCOPY WITH HOLMIUM LASER AND LEFT  STENT PLACEMENT;  Surgeon: Malka So, MD;  Location: [facility];  Service: Urology;  Laterality: Left;  . Holmium laser application Left [id number]  Procedure:  HOLMIUM LASER APPLICATION;  Surgeon: Malka So, MD;  Location: [facility];  Service: Urology;  Laterality: Left;  . Nm myocar perf wall motion  03/01/1999    mild lateral ischemia  . Mitral valve replacement with chordal presevation using bioprosthetic valve/  ligation left atrial appendage/  left-sided maze procedure  04-03-2009  DR Ethel Rana,  SERIAL # [id number]  . Transthoracic echocardiogram  09-07-2012  DR CROITORU    MILD LVH/  EF 55-60%/  MILD TO MODERATE AR/   SEVERE LAE  &  RAE/  MILD TO MODERATE TR/   MITRAL VALVE BIOPROSTHESIS LEAFLET NORMAL,  MODERATE STENOSIS WITH NO SIG. REGURG/  MODERATE RVE  . Cardiac  catheterization  03/27/2009    normal coronaries; grade IV Mitral Insuff.  . Cardiac catheterization  2001  &  2006  . Vaginal hysterectomy  1982  . Extracorporeal shock wave lithotripsy Left 07-26-2012  . Cystoscopy with ureteroscopy and stent placement Right 04/25/2013    Procedure: RIGHT URETEROSCOPY WITH  STENT PLACEMENT;  Surgeon: Irine Seal, MD;  Location: [facility];  Service: Urology;  Laterality: Right;  . Holmium laser application Right [id number]    Procedure: HOLMIUM LASER APPLICATION;  Surgeon: Irine Seal, MD;  Location: [facility];  Service: Urology;  Laterality: Right;   Family History  Problem Relation Age of Onset  . Cancer Mother   . Heart attack Father    Social History  Substance Use Topics  . Smoking status: Never Smoker   . Smokeless tobacco: Never Used     Comment: never used tobacco  . Alcohol Use: No   OB History    No data available     Review of Systems  HENT:       + Left occiput pain  Musculoskeletal: Negative for back pain and neck pain.  Neurological: Negative for dizziness, syncope and light-headedness.  All other systems reviewed and are negative.  Allergies  Review of patient's allergies indicates no known allergies.  Home Medications   Prior to Admission medications   Medication Sig Start Date End Date Taking? Authorizing Provider  ALPRAZolam Duanne Moron) 0.5 MG tablet Take 0.5 mg by mouth at bedtime.   Yes Historical Provider, MD  amLODipine-olmesartan (AZOR) 5-20 MG per tablet Take 1 tablet by mouth daily. 05/06/14  Yes Mihai Croitoru, MD  aspirin EC 81 MG tablet Take 81 mg by mouth every morning.   Yes Historical Provider, MD  B Complex Vitamins (VITAMIN B COMPLEX PO) Take 1 tablet by mouth every morning.    Yes Historical Provider, MD  furosemide (LASIX) 40 MG tablet Take 40 mg by mouth every morning.    Yes Historical Provider, MD  metoprolol tartrate (LOPRESSOR) 25 MG tablet Take 12.5 mg by mouth 2 (two)  times daily.    Yes Historical Provider, MD  Multiple Vitamin (MULTIVITAMIN WITH MINERALS) TABS Take 1 tablet by mouth every morning.   Yes Historical Provider, MD  omeprazole (PRILOSEC) 20 MG capsule Take 20 mg by mouth every morning.    Yes Historical Provider, MD  potassium chloride SA (K-DUR,KLOR-CON) 20 MEQ tablet Take 20 mEq by mouth daily after supper.    Yes Historical Provider, MD  simvastatin (ZOCOR) 20 MG tablet Take 10 mg by mouth daily after supper.    Yes Historical Provider, MD  azithromycin (ZITHROMAX) 250 MG tablet Take 1 tablet (250 mg total) by mouth daily. Take 1 tablet every day until finished. You  received her first dose in the emergency department on 01/26/2015. 01/26/15   Ottie Glazier, PA-C   BP 120/49 mmHg  Pulse 86  Temp(Src) 98.6 F (37 C) (Oral)  Resp 16  Ht 5\' 4"  (1.626 m)  Wt 74.844 kg  BMI 28.31 kg/m2  SpO2 100%   Physical Exam  Constitutional: She is oriented to person, place, and time. She appears well-developed and well-nourished. No distress.  HENT:  Head: Normocephalic.  Hematoma on posterior, left scalp; no laceration  Eyes: Conjunctivae and EOM are normal.  Neck: Neck supple. No tracheal deviation present.  Cardiovascular: Normal rate.   She has a pacemaker. Regular rate and rhythm. No murmurs.  Pulmonary/Chest: Effort normal and breath sounds normal. No accessory muscle usage. No respiratory distress.  Lungs clear to auscultation bilaterally. No use of accessory muscles. No decreased breath sounds. Speaking full sentences.  Musculoskeletal: Normal range of motion.  He is able to flex and extend her knees bilaterally. No other signs of trauma.  Neurological: She is alert and oriented to person, place, and time. She has normal strength. No sensory deficit. GCS eye subscore is 4. GCS verbal subscore is 5. GCS motor subscore is 6.  GCS 15. Cranial nerves III through XII intact. No sensory or motor deficit. No sensory or motor deficit. No facial  droop or slurred speech.  Skin: Skin is warm and dry.  Psychiatric: She has a normal mood and affect. Her behavior is normal.  Nursing note and vitals reviewed.   ED Course  Procedures (including critical care time)  DIAGNOSTIC STUDIES: Oxygen Saturation is 100% on room air, normal by my interpretation.    COORDINATION OF CARE: 5:58 PM-Discussed treatment plan which includes CT Head with pt at bedside and pt agreed to plan.   Labs Review Labs Reviewed - No data to display  Imaging Review Dg Chest 2 View  01/26/2015  CLINICAL DATA:  81 year old female with history of cough. Possible bronchitis. Hypertension. EXAM: CHEST  2 VIEW COMPARISON:  Chest x-rays 08/13/2013. FINDINGS: Left lower lobe airspace consolidation. Elevated left hemidiaphragm. Right lung is clear. No pleural effusions. No evidence of pulmonary edema. Heart size is mildly enlarged (unchanged). The patient is rotated to the left on today's exam, resulting in distortion of the mediastinal contours and reduced diagnostic sensitivity and specificity for mediastinal pathology. Atherosclerosis in the thoracic aorta. Left-sided pacemaker device with lead tips projecting over the expected location of the right atrium and right ventricular apex. IMPRESSION: 1. New area of airspace consolidation in the left lower lobe concerning for pneumonia. Followup PA and lateral chest X-ray is recommended in 3-4 weeks following trial of antibiotic therapy to ensure resolution and exclude underlying malignancy. 2. Mild cardiomegaly. 3. Atherosclerosis. Electronically Signed   By: Vinnie Langton M.D.   On: 01/26/2015 19:35   Ct Head Wo Contrast  01/26/2015  CLINICAL DATA:  Slip and fall. Hit head. Hematoma on the left side of the head. EXAM: CT HEAD WITHOUT CONTRAST TECHNIQUE: Contiguous axial images were obtained from the base of the skull through the vertex without contrast. COMPARISON:  04/17/2009 FINDINGS: Stable cerebral and cerebellar atrophy.  No evidence for acute hemorrhage, mass lesion, midline shift, hydrocephalus or large infarct. Stable areas of white matter disease or encephalomalacia along the frontal lobes near the vertex. There is high-density soft tissue swelling along the left posterior scalp compatible with a scalp hematoma. Negative for a calvarial fracture. Previously identified lesion along the medial left orbit is not well characterized on  this examination. IMPRESSION: No acute intracranial abnormality. Stable atrophy. Left posterior scalp hematoma.  No underlying fracture. Electronically Signed   By: Markus Daft M.D.   On: 01/26/2015 18:56   I have personally reviewed and evaluated these images as part of my medical decision-making.   EKG Interpretation None      MDM   Final diagnoses:  Fall, initial encounter  Head contusion, initial encounter  Community acquired pneumonia  Pt presents to the ED for back of head pain after falling earlier today. No LOC. She states she only called the ambulance because she could not get up after the fall. She states she has chronically weak legs. She refused pain medications stated she took Tylenol prior to arrival. She has no neurological deficits. She uses a walker when she has not home. Her CT scan is negative for acute bleed or any other acute intracranial findings. Her daughter also states that she has been coughing and was diagnosed with bronchitis yesterday. She denies having an chest x-ray done. She says that her symptoms have not gotten any better. Her chest x-ray shows early left-sided pneumonia. I spoke to Dr. Maryan Rued regarding this patient was seen and evaluated her. She agrees with the plan to put the patient on a Zithromax in and have her follow-up within 48 hours with her PCP. She has no signs of respiratory distress or chest pain. She is 97% oxygen on room air. She is well appearing and afebrile. I discussed all findings and follow-up with patient and her son who was at  bedside. Return precautions discussed. I also discussed that she would need to be seen by her primary care physician within 48 hours. She was prescribed Zithromax. I personally performed the services described in this documentation, which was scribed in my presence. The recorded information has been reviewed and is accurate.      Ottie Glazier, PA-C 01/26/15 2214  Blanchie Dessert, MD 01/28/15 2203

## 2015-01-26 NOTE — ED Notes (Signed)
Pt here for fall. sts slipped when trying to answer phone. Small hematoma to left head. No LOC or confusion. No blood thinners. Pupils pinpoint. Some head pain. BP 118/60, pulse 88, RR 16

## 2015-01-28 ENCOUNTER — Inpatient Hospital Stay (HOSPITAL_COMMUNITY)
Admission: EM | Admit: 2015-01-28 | Discharge: 2015-02-04 | DRG: 193 | Disposition: A | Discharge status: Skilled Nursing Facility | Payer: Medicare Other | Attending: Internal Medicine | Admitting: Internal Medicine

## 2015-01-28 ENCOUNTER — Inpatient Hospital Stay (HOSPITAL_COMMUNITY): Payer: Medicare Other

## 2015-01-28 ENCOUNTER — Emergency Department (HOSPITAL_COMMUNITY): Payer: Medicare Other

## 2015-01-28 ENCOUNTER — Encounter (HOSPITAL_COMMUNITY): Payer: Self-pay | Admitting: Emergency Medicine

## 2015-01-28 DIAGNOSIS — F329 Major depressive disorder, single episode, unspecified: Secondary | ICD-10-CM | POA: Diagnosis present

## 2015-01-28 DIAGNOSIS — W010XXA Fall on same level from slipping, tripping and stumbling without subsequent striking against object, initial encounter: Secondary | ICD-10-CM | POA: Diagnosis present

## 2015-01-28 DIAGNOSIS — I872 Venous insufficiency (chronic) (peripheral): Secondary | ICD-10-CM | POA: Diagnosis present

## 2015-01-28 DIAGNOSIS — Z7982 Long term (current) use of aspirin: Secondary | ICD-10-CM | POA: Diagnosis not present

## 2015-01-28 DIAGNOSIS — J189 Pneumonia, unspecified organism: Principal | ICD-10-CM | POA: Diagnosis present

## 2015-01-28 DIAGNOSIS — Z953 Presence of xenogenic heart valve: Secondary | ICD-10-CM

## 2015-01-28 DIAGNOSIS — E213 Hyperparathyroidism, unspecified: Secondary | ICD-10-CM | POA: Diagnosis present

## 2015-01-28 DIAGNOSIS — R059 Cough, unspecified: Secondary | ICD-10-CM

## 2015-01-28 DIAGNOSIS — N390 Urinary tract infection, site not specified: Secondary | ICD-10-CM | POA: Diagnosis present

## 2015-01-28 DIAGNOSIS — Z1623 Resistance to quinolones and fluoroquinolones: Secondary | ICD-10-CM | POA: Diagnosis present

## 2015-01-28 DIAGNOSIS — R410 Disorientation, unspecified: Secondary | ICD-10-CM

## 2015-01-28 DIAGNOSIS — Z85828 Personal history of other malignant neoplasm of skin: Secondary | ICD-10-CM | POA: Diagnosis not present

## 2015-01-28 DIAGNOSIS — I10 Essential (primary) hypertension: Secondary | ICD-10-CM | POA: Diagnosis present

## 2015-01-28 DIAGNOSIS — D693 Immune thrombocytopenic purpura: Secondary | ICD-10-CM | POA: Diagnosis present

## 2015-01-28 DIAGNOSIS — M62838 Other muscle spasm: Secondary | ICD-10-CM | POA: Diagnosis present

## 2015-01-28 DIAGNOSIS — I4821 Permanent atrial fibrillation: Secondary | ICD-10-CM | POA: Diagnosis present

## 2015-01-28 DIAGNOSIS — F419 Anxiety disorder, unspecified: Secondary | ICD-10-CM | POA: Diagnosis present

## 2015-01-28 DIAGNOSIS — Z8249 Family history of ischemic heart disease and other diseases of the circulatory system: Secondary | ICD-10-CM

## 2015-01-28 DIAGNOSIS — B9620 Unspecified Escherichia coli [E. coli] as the cause of diseases classified elsewhere: Secondary | ICD-10-CM | POA: Diagnosis present

## 2015-01-28 DIAGNOSIS — M542 Cervicalgia: Secondary | ICD-10-CM | POA: Diagnosis present

## 2015-01-28 DIAGNOSIS — I5032 Chronic diastolic (congestive) heart failure: Secondary | ICD-10-CM | POA: Diagnosis present

## 2015-01-28 DIAGNOSIS — Z809 Family history of malignant neoplasm, unspecified: Secondary | ICD-10-CM | POA: Diagnosis not present

## 2015-01-28 DIAGNOSIS — S0003XA Contusion of scalp, initial encounter: Secondary | ICD-10-CM | POA: Diagnosis present

## 2015-01-28 DIAGNOSIS — N179 Acute kidney failure, unspecified: Secondary | ICD-10-CM | POA: Diagnosis present

## 2015-01-28 DIAGNOSIS — R05 Cough: Secondary | ICD-10-CM

## 2015-01-28 DIAGNOSIS — K219 Gastro-esophageal reflux disease without esophagitis: Secondary | ICD-10-CM | POA: Diagnosis present

## 2015-01-28 DIAGNOSIS — G250 Essential tremor: Secondary | ICD-10-CM | POA: Diagnosis present

## 2015-01-28 DIAGNOSIS — Z79899 Other long term (current) drug therapy: Secondary | ICD-10-CM

## 2015-01-28 DIAGNOSIS — R062 Wheezing: Secondary | ICD-10-CM

## 2015-01-28 DIAGNOSIS — Z950 Presence of cardiac pacemaker: Secondary | ICD-10-CM | POA: Diagnosis not present

## 2015-01-28 DIAGNOSIS — D696 Thrombocytopenia, unspecified: Secondary | ICD-10-CM | POA: Diagnosis present

## 2015-01-28 DIAGNOSIS — G9340 Encephalopathy, unspecified: Secondary | ICD-10-CM | POA: Diagnosis present

## 2015-01-28 DIAGNOSIS — R627 Adult failure to thrive: Secondary | ICD-10-CM | POA: Diagnosis present

## 2015-01-28 DIAGNOSIS — I482 Chronic atrial fibrillation: Secondary | ICD-10-CM | POA: Diagnosis present

## 2015-01-28 DIAGNOSIS — R531 Weakness: Secondary | ICD-10-CM

## 2015-01-28 DIAGNOSIS — E785 Hyperlipidemia, unspecified: Secondary | ICD-10-CM | POA: Diagnosis present

## 2015-01-28 DIAGNOSIS — R0600 Dyspnea, unspecified: Secondary | ICD-10-CM

## 2015-01-28 HISTORY — DX: Pneumonia, unspecified organism: J18.9

## 2015-01-28 HISTORY — DX: Presence of cardiac pacemaker: Z95.0

## 2015-01-28 HISTORY — DX: Basal cell carcinoma of skin of other parts of face: C44.319

## 2015-01-28 HISTORY — DX: Calculus of kidney: N20.0

## 2015-01-28 LAB — BRAIN NATRIURETIC PEPTIDE: B Natriuretic Peptide: 135.9 pg/mL — ABNORMAL HIGH (ref 0.0–100.0)

## 2015-01-28 LAB — URINALYSIS, ROUTINE W REFLEX MICROSCOPIC
GLUCOSE, UA: NEGATIVE mg/dL
Ketones, ur: NEGATIVE mg/dL
Nitrite: POSITIVE — AB
PH: 5.5 (ref 5.0–8.0)
PROTEIN: NEGATIVE mg/dL
SPECIFIC GRAVITY, URINE: 1.025 (ref 1.005–1.030)

## 2015-01-28 LAB — COMPREHENSIVE METABOLIC PANEL
ALBUMIN: 3.4 g/dL — AB (ref 3.5–5.0)
ALT: 20 U/L (ref 14–54)
AST: 43 U/L — AB (ref 15–41)
Alkaline Phosphatase: 43 U/L (ref 38–126)
Anion gap: 7 (ref 5–15)
BUN: 21 mg/dL — AB (ref 6–20)
CHLORIDE: 106 mmol/L (ref 101–111)
CO2: 29 mmol/L (ref 22–32)
CREATININE: 1.35 mg/dL — AB (ref 0.44–1.00)
Calcium: 10.5 mg/dL — ABNORMAL HIGH (ref 8.9–10.3)
GFR calc Af Amer: 41 mL/min — ABNORMAL LOW (ref >60)
GFR, EST NON AFRICAN AMERICAN: 35 mL/min — AB (ref >60)
GLUCOSE: 126 mg/dL — AB (ref 65–99)
POTASSIUM: 5.1 mmol/L (ref 3.5–5.1)
Sodium: 142 mmol/L (ref 135–145)
Total Bilirubin: 0.6 mg/dL (ref 0.3–1.2)
Total Protein: 6.2 g/dL — ABNORMAL LOW (ref 6.5–8.1)

## 2015-01-28 LAB — CBC WITH DIFFERENTIAL/PLATELET
BASOS ABS: 0 10*3/uL (ref 0.0–0.1)
BASOS PCT: 0 %
EOS PCT: 1 %
Eosinophils Absolute: 0.1 10*3/uL (ref 0.0–0.7)
HCT: 41.7 % (ref 36.0–46.0)
Hemoglobin: 13.1 g/dL (ref 12.0–15.0)
LYMPHS PCT: 15 %
Lymphs Abs: 1.1 10*3/uL (ref 0.7–4.0)
MCH: 30.9 pg (ref 26.0–34.0)
MCHC: 31.4 g/dL (ref 30.0–36.0)
MCV: 98.3 fL (ref 78.0–100.0)
MONO ABS: 0.9 10*3/uL (ref 0.1–1.0)
Monocytes Relative: 13 %
NEUTROS ABS: 4.9 10*3/uL (ref 1.7–7.7)
Neutrophils Relative %: 70 %
PLATELETS: 81 10*3/uL — AB (ref 150–400)
RBC: 4.24 MIL/uL (ref 3.87–5.11)
RDW: 15.1 % (ref 11.5–15.5)
WBC: 7 10*3/uL (ref 4.0–10.5)

## 2015-01-28 LAB — URINE MICROSCOPIC-ADD ON

## 2015-01-28 LAB — I-STAT CG4 LACTIC ACID, ED: Lactic Acid, Venous: 1.78 mmol/L (ref 0.5–2.0)

## 2015-01-28 MED ORDER — ACETAMINOPHEN 325 MG PO TABS: 650.0000 mg | ORAL_TABLET | Freq: Four times a day (QID) | ORAL | Status: DC | PRN

## 2015-01-28 MED ORDER — ACETAMINOPHEN 500 MG PO TABS
500.0000 mg | ORAL_TABLET | Freq: Once | ORAL | Status: AC
  Administered 2015-01-28: 500 mg via ORAL
  Filled 2015-01-28: qty 1

## 2015-01-28 MED ORDER — SODIUM CHLORIDE 0.9 % IV BOLUS (SEPSIS)
1000.0000 mL | Freq: Once | INTRAVENOUS | Status: AC
  Administered 2015-01-28: 1000 mL via INTRAVENOUS

## 2015-01-28 MED ORDER — SIMVASTATIN 10 MG PO TABS
10.0000 mg | ORAL_TABLET | Freq: Every day | ORAL | Status: DC
  Administered 2015-01-28 – 2015-02-04 (×8): 10 mg via ORAL
  Filled 2015-01-28 (×8): qty 1

## 2015-01-28 MED ORDER — SODIUM CHLORIDE 0.9 % IJ SOLN: 3.0000 mL | INTRAMUSCULAR | Status: DC | PRN

## 2015-01-28 MED ORDER — METOPROLOL TARTRATE 12.5 MG HALF TABLET
12.5000 mg | ORAL_TABLET | Freq: Two times a day (BID) | ORAL | Status: DC
  Administered 2015-01-28 – 2015-02-04 (×14): 12.5 mg via ORAL
  Filled 2015-01-28 (×14): qty 1

## 2015-01-28 MED ORDER — SODIUM CHLORIDE 0.9 % IV SOLN: 250.0000 mL | INTRAVENOUS | Status: DC | PRN

## 2015-01-28 MED ORDER — IPRATROPIUM BROMIDE 0.02 % IN SOLN
0.5000 mg | Freq: Once | RESPIRATORY_TRACT | Status: AC
  Administered 2015-01-28: 0.5 mg via RESPIRATORY_TRACT
  Filled 2015-01-28: qty 2.5

## 2015-01-28 MED ORDER — ONDANSETRON HCL 4 MG PO TABS: 4.0000 mg | ORAL_TABLET | Freq: Four times a day (QID) | ORAL | Status: DC | PRN

## 2015-01-28 MED ORDER — ACETAMINOPHEN 650 MG RE SUPP: 650.0000 mg | Freq: Four times a day (QID) | RECTAL | Status: DC | PRN

## 2015-01-28 MED ORDER — LEVOFLOXACIN IN D5W 750 MG/150ML IV SOLN
750.0000 mg | Freq: Once | INTRAVENOUS | Status: AC
  Administered 2015-01-28: 750 mg via INTRAVENOUS
  Filled 2015-01-28: qty 150

## 2015-01-28 MED ORDER — DOCUSATE SODIUM 100 MG PO CAPS
100.0000 mg | ORAL_CAPSULE | Freq: Two times a day (BID) | ORAL | Status: DC
  Administered 2015-01-28 – 2015-02-04 (×14): 100 mg via ORAL
  Filled 2015-01-28 (×17): qty 1

## 2015-01-28 MED ORDER — ONDANSETRON HCL 4 MG/2ML IJ SOLN
4.0000 mg | Freq: Four times a day (QID) | INTRAMUSCULAR | Status: DC | PRN
  Administered 2015-01-29: 4 mg via INTRAVENOUS
  Filled 2015-01-28: qty 2

## 2015-01-28 MED ORDER — SODIUM CHLORIDE 0.9 % IJ SOLN
3.0000 mL | Freq: Two times a day (BID) | INTRAMUSCULAR | Status: DC
  Administered 2015-01-28 – 2015-02-04 (×8): 3 mL via INTRAVENOUS

## 2015-01-28 MED ORDER — IPRATROPIUM-ALBUTEROL 0.5-2.5 (3) MG/3ML IN SOLN
3.0000 mL | Freq: Four times a day (QID) | RESPIRATORY_TRACT | Status: DC
  Administered 2015-01-28: 3 mL via RESPIRATORY_TRACT
  Filled 2015-01-28: qty 3

## 2015-01-28 MED ORDER — HYDROCODONE-ACETAMINOPHEN 5-325 MG PO TABS
1.0000 | ORAL_TABLET | ORAL | Status: DC | PRN
  Administered 2015-01-29: 2 via ORAL
  Filled 2015-01-28: qty 2

## 2015-01-28 MED ORDER — LEVOFLOXACIN IN D5W 750 MG/150ML IV SOLN
750.0000 mg | INTRAVENOUS | Status: DC
  Administered 2015-01-30: 750 mg via INTRAVENOUS
  Filled 2015-01-28: qty 150

## 2015-01-28 MED ORDER — PANTOPRAZOLE SODIUM 40 MG PO TBEC
40.0000 mg | DELAYED_RELEASE_TABLET | Freq: Every day | ORAL | Status: DC
  Administered 2015-01-29 – 2015-02-04 (×7): 40 mg via ORAL
  Filled 2015-01-28 (×7): qty 1

## 2015-01-28 MED ORDER — IPRATROPIUM-ALBUTEROL 0.5-2.5 (3) MG/3ML IN SOLN
3.0000 mL | Freq: Three times a day (TID) | RESPIRATORY_TRACT | Status: DC
  Administered 2015-01-29 – 2015-01-31 (×8): 3 mL via RESPIRATORY_TRACT
  Filled 2015-01-28 (×8): qty 3

## 2015-01-28 MED ORDER — LEVALBUTEROL HCL 1.25 MG/0.5ML IN NEBU
1.2500 mg | INHALATION_SOLUTION | Freq: Once | RESPIRATORY_TRACT | Status: AC
  Administered 2015-01-28: 1.25 mg via RESPIRATORY_TRACT
  Filled 2015-01-28 (×2): qty 0.5

## 2015-01-28 MED ORDER — ALBUTEROL SULFATE (2.5 MG/3ML) 0.083% IN NEBU: 2.5000 mg | INHALATION_SOLUTION | RESPIRATORY_TRACT | Status: DC | PRN

## 2015-01-28 MED ORDER — ALPRAZOLAM 0.5 MG PO TABS
0.5000 mg | ORAL_TABLET | Freq: Every day | ORAL | Status: DC
  Administered 2015-01-28 – 2015-02-03 (×7): 0.5 mg via ORAL
  Filled 2015-01-28 (×7): qty 1

## 2015-01-28 MED ORDER — SODIUM CHLORIDE 0.9 % IJ SOLN
3.0000 mL | Freq: Two times a day (BID) | INTRAMUSCULAR | Status: DC
  Administered 2015-01-28 – 2015-01-29 (×2): 3 mL via INTRAVENOUS

## 2015-01-28 NOTE — Progress Notes (Signed)
ANTIBIOTIC CONSULT NOTE - INITIAL  Pharmacy Consult for levofloxacin Indication: pneumonia  No Known Allergies  Patient Measurements:    Vital Signs: Temp: 98 F (36.7 C) (12/14 1314) Temp Source: Oral (12/14 1314) BP: 100/69 mmHg (12/14 1314) Pulse Rate: 95 (12/14 1314) Intake/Output from previous day:   Intake/Output from this shift:    Labs:  Recent Labs  01/28/15 1320  WBC 7.0  HGB 13.1  PLT 81*  CREATININE 1.35*   Estimated Creatinine Clearance: 31.3 mL/min (by C-G formula based on Cr of 1.35). No results for input(s): VANCOTROUGH, VANCOPEAK, VANCORANDOM, GENTTROUGH, GENTPEAK, GENTRANDOM, TOBRATROUGH, TOBRAPEAK, TOBRARND, AMIKACINPEAK, AMIKACINTROU, AMIKACIN in the last 72 hours.   Microbiology: No results found for this or any previous visit (from the past 720 hour(s)).  Medical History: Past Medical History  Diagnosis Date  . CHF (congestive heart failure) ([place])   . Edema of both legs 04-23-2013  pt states feet swelling (wears ted hose)    bilateral venous ablation procedures done in IR, L>R  . Hypertension   . Venous insufficiency, peripheral   . H/O hiatal hernia   . Anxiety   . Depression   . Arthritis   . Permanent atrial fibrillation ([place])     CARDIOLOGIST-- DR [id number]  . Sick sinus syndrome ([place])     S/P  PACEMAKER  2009  . S/P mitral valve replacement with bioprosthetic valve     04-03-2009  . S/P cardiac pacemaker procedure     LAST PACER CHECK  04-23-2013  IN [facility]  . History of kidney stones   . Right ureteral stone   . Thrombocytopenia, immune     CHRONIC---  HEMOTOLOGIST---  DR ENNEVER  . Osteopenia   . Benign essential tremor   . Schatzki's ring   . Hyperparathyroidism ([place])     MILD--  STABLE  . Hyperlipidemia   . GERD (gastroesophageal reflux disease)   . History of basal cell carcinoma excision     foredhead  . Wears glasses     Medications:   (Not in a hospital admission) Assessment: 81 yo F w/ PMH CHF, HTN, Afib,  MVR presenting w/ persistent neck pain after a fall from standing (seen in ED on 12/12). Pt also had a productive cough at that time and was sent home with azithromycin. Family states cough is worsening, but is now non-productive. Pt was sent here from PCP for admission. CT chest shows persistent left lower lobe infiltrate. Pharmacy consulted to dose levofloxacin for PNA.   Goal of Therapy:  Resolution of infection, clinical improvement  Plan:  - Levofloxacin 750 mg IV q48h - Expected duration 7 days with resolution of temperature and/or normalization of WBC - Follow up culture results, renal fxn, clinical improvement, repeat CT chest  Governor Specking, PharmD Clinical Pharmacy Resident Pager: [phone] 01/28/2015,4:22 PM

## 2015-01-28 NOTE — ED Notes (Signed)
Pt sts fall on Monday and was evaluated; pt with increasing pain in her neck and head; pt had contusion to back of head; pt also being treated for PNA

## 2015-01-28 NOTE — H&P (Addendum)
[facility] History and Physical  [name] [id number] DOB: 25-Oct-1929 DOA: 01/28/2015  Referring physician: Verta Ellen PCP:  Melinda Crutch, MD   Chief Complaint:   HPI: [name] is a 81 y.o. female [name] is a 81 y.o. female with a PMHx of CHF, HTN, sick sinus syndrome s/p pacemaker, hiatal hernia, Afib, chronic thrombocytopenia, who present to ED with worsening weakness, inability to ambulate. Patient was seen at Urgent care on Sunday and diagnosed with bronchitis. She then fell and presented to ED 12-12. Chest x ray showed PNA. She was discharge on Azithromycin. She presents today with worsening weakness, and confusion per son. Also patient with cough, dyspnea, and per patient 5 pound weigh gain.   Evaluation in the ED; WBC at 7, cr at 1.35, lactic acid at 1.7, UA with positive nitrates, Chest x ray : with persistent left lower lobe infiltrates.    Review of Systems:  Negative, except as per HPI  Past Medical History  Diagnosis Date  . CHF (congestive heart failure) (HCC)   . Edema of both legs 04-23-2013  pt states feet swelling (wears ted hose)    bilateral venous ablation procedures done in IR, L>R  . Hypertension   . Venous insufficiency, peripheral   . H/O hiatal hernia   . Anxiety   . Depression   . Arthritis   . Permanent atrial fibrillation (HCC)     CARDIOLOGIST-- DR CROITORU  . Sick sinus syndrome (HCC)     S/P  PACEMAKER  2009  . S/P mitral valve replacement with bioprosthetic valve     02 -18-2011  . S/P cardiac pacemaker procedure     LAST PACER CHECK  04-23-2013  IN [facility]  . History of kidney stones   . Right ureteral stone   . Thrombocytopenia, immune     CHRONIC---  HEMOTOLOGIST---  DR ENNEVER  . Osteopenia   . Benign essential tremor   . Schatzki's ring   . Hyperparathyroidism ([place])     MILD--  STABLE  . Hyperlipidemia   . GERD (gastroesophageal reflux disease)   . History of basal cell carcinoma excision     [place]    . Wears glasses    Past Surgical History  Procedure Laterality Date  . Hand surgery Right 05/2011  . Permanent pacemaker insertion  02/26/2007    Dual Chamber [employer] Programmed VVIR--- Generator [id number]/  [id number]   . Cystoscopy w/ ureteral stent placement Left 08/01/2012    Procedure: CYSTOSCOPY WITH RETROGRADE PYELOGRAM/URETERAL STENT PLACEMENT;  Surgeon: Dutch Gray, MD;  Location: [facility];  Service: Urology;  Laterality: Left;  . Cystoscopy with ureteroscopy Left 08/14/2012    Procedure: LEFT URETEROSCOPY WITH HOLMIUM LASER AND LEFT  STENT PLACEMENT;  Surgeon: Malka So, MD;  Location: [facility];  Service: Urology;  Laterality: Left;  . Holmium laser application Left [id number]    Procedure:  HOLMIUM LASER APPLICATION;  Surgeon: Malka So, MD;  Location: [facility];  Service: Urology;  Laterality: Left;  . Nm myocar perf wall motion  03/01/1999    mild lateral ischemia  . Mitral valve replacement with chordal presevation using bioprosthetic valve/  ligation left atrial appendage/  left-sided maze procedure  04-03-2009  DR Ethel Rana,  SERIAL # [id number]  . Transthoracic echocardiogram  09-07-2012  DR CROITORU    MILD LVH/  EF 55-60%/  MILD TO MODERATE AR/   SEVERE LAE  &  RAE/  MILD TO MODERATE  TR/   MITRAL VALVE BIOPROSTHESIS LEAFLET NORMAL,  MODERATE STENOSIS WITH NO SIG. REGURG/  MODERATE RVE  . Cardiac catheterization  03/27/2009    normal coronaries; grade IV Mitral Insuff.  . Cardiac catheterization  2001  &  2006  . Vaginal hysterectomy  1982  . Extracorporeal shock wave lithotripsy Left 07-26-2012  . Cystoscopy with ureteroscopy and stent placement Right 04/25/2013    Procedure: RIGHT URETEROSCOPY WITH  STENT PLACEMENT;  Surgeon: Irine Seal, MD;  Location: [facility];  Service: Urology;  Laterality: Right;  . Holmium laser application Right [id number]    Procedure: HOLMIUM LASER APPLICATION;  Surgeon: Irine Seal, MD;  Location: [facility];   Service: Urology;  Laterality: Right;   Social History:  reports that she has never smoked. She has never used smokeless tobacco. She reports that she does not drink alcohol or use illicit drugs.  No Known Allergies  Family History  Problem Relation Age of Onset  . Cancer Mother   . Heart attack Father     Prior to Admission medications   Medication Sig Start Date End Date Taking? Authorizing Provider  ALPRAZolam Duanne Moron) 0.5 MG tablet Take 0.5 mg by mouth at bedtime.    Historical Provider, MD  amLODipine-olmesartan (AZOR) 5-20 MG per tablet Take 1 tablet by mouth daily. 05/06/14   Mihai Croitoru, MD  aspirin EC 81 MG tablet Take 81 mg by mouth every morning.    Historical Provider, MD  azithromycin (ZITHROMAX) 250 MG tablet Take 1 tablet (250 mg total) by mouth daily. Take 1 tablet every day until finished. You received her first dose in the emergency department on 01/26/2015. 01/26/15   Ottie Glazier, PA-C  B Complex Vitamins (VITAMIN B COMPLEX PO) Take 1 tablet by mouth every morning.     Historical Provider, MD  furosemide (LASIX) 40 MG tablet Take 40 mg by mouth every morning.     Historical Provider, MD  metoprolol tartrate (LOPRESSOR) 25 MG tablet Take 12.5 mg by mouth 2 (two) times daily.     Historical Provider, MD  Multiple Vitamin (MULTIVITAMIN WITH MINERALS) TABS Take 1 tablet by mouth every morning.    Historical Provider, MD  omeprazole (PRILOSEC) 20 MG capsule Take 20 mg by mouth every morning.     Historical Provider, MD  potassium chloride SA (K-DUR,KLOR-CON) 20 MEQ tablet Take 20 mEq by mouth daily after supper.     Historical Provider, MD  simvastatin (ZOCOR) 20 MG tablet Take 10 mg by mouth daily after supper.     Historical Provider, MD   Physical Exam: Filed Vitals:   01/28/15 1314 01/28/15 1600  BP: 100/69 113/85  Pulse: 95 94  Temp: 98 F (36.7 C)   TempSrc: Oral   Resp: 18   SpO2: 91% 94%    Wt Readings from Last 3 Encounters:  01/26/15 74.844 kg  (165 lb)  11/19/14 75.751 kg (167 lb)  11/05/14 82.01 kg (180 lb 12.8 oz)    General:  Appears calm and comfortable Eyes: PERRL, normal lids, irises & conjunctiva ENT: grossly normal hearing, lips & tongue Neck: no LAD, masses or thyromegaly Cardiovascular: RRR, no m/r/g. Trace  LE edema. Positive JVD Telemetry: SR, no arrhythmias  Respiratory: Normal respiratory effort. Bilateral crackles.  Abdomen: soft, ntnd Skin: no rash or induration seen on limited exam Musculoskeletal: grossly normal tone BUE/BLE Psychiatric: grossly normal mood and affect, speech fluent and appropriate Neurologic: grossly non-focal. Confuse.  Labs on Admission:  Basic Metabolic Panel:  Recent Labs Lab 01/28/15 1320  NA 142  K 5.1  CL 106  CO2 29  GLUCOSE 126*  BUN 21*  CREATININE 1.35*  CALCIUM 10.5*   Liver Function Tests:  Recent Labs Lab 01/28/15 1320  AST 43*  ALT 20  ALKPHOS 43  BILITOT 0.6  PROT 6.2*  ALBUMIN 3.4*   No results for input(s): LIPASE, AMYLASE in the last 168 hours. No results for input(s): AMMONIA in the last 168 hours. CBC:  Recent Labs Lab 01/28/15 1320  WBC 7.0  NEUTROABS 4.9  HGB 13.1  HCT 41.7  MCV 98.3  PLT 81*   Cardiac Enzymes: No results for input(s): CKTOTAL, CKMB, CKMBINDEX, TROPONINI in the last 168 hours.  BNP (last 3 results) No results for input(s): BNP in the last 8760 hours.  ProBNP (last 3 results) No results for input(s): PROBNP in the last 8760 hours.  CBG: No results for input(s): GLUCAP in the last 168 hours.  Radiological Exams on Admission: Dg Chest 2 View  01/28/2015  CLINICAL DATA:  Pneumonia, cough for 3 days, history CHF, hypertension, atrial fibrillation, MVR EXAM: CHEST  2 VIEW COMPARISON:  01/26/2015 FINDINGS: LEFT subclavian transvenous pacemaker leads project at RIGHT atrium and RIGHT ventricle, unchanged. Enlargement of cardiac silhouette post median sternotomy. Atherosclerotic calcification aorta.  Mediastinal contours and pulmonary vascularity normal. Bronchitic changes with hyperinflation. Persistent LEFT lower lobe infiltrate. No definite pleural effusion or pneumothorax. Bones demineralized. IMPRESSION: Persistent LEFT lower lobe infiltrate. Followup radiographs until resolution recommended to exclude underlying abnormalities in the LEFT lower lobe. Enlargement of cardiac silhouette post MVR and pacemaker. Electronically Signed   By: Lavonia Dana M.D.   On: 01/28/2015 16:07   Dg Chest 2 View  01/26/2015  CLINICAL DATA:  81 year old female with history of cough. Possible bronchitis. Hypertension. EXAM: CHEST  2 VIEW COMPARISON:  Chest x-rays 08/13/2013. FINDINGS: Left lower lobe airspace consolidation. Elevated left hemidiaphragm. Right lung is clear. No pleural effusions. No evidence of pulmonary edema. Heart size is mildly enlarged (unchanged). The patient is rotated to the left on today's exam, resulting in distortion of the mediastinal contours and reduced diagnostic sensitivity and specificity for mediastinal pathology. Atherosclerosis in the thoracic aorta. Left-sided pacemaker device with lead tips projecting over the expected location of the right atrium and right ventricular apex. IMPRESSION: 1. New area of airspace consolidation in the left lower lobe concerning for pneumonia. Followup PA and lateral chest X-ray is recommended in 3-4 weeks following trial of antibiotic therapy to ensure resolution and exclude underlying malignancy. 2. Mild cardiomegaly. 3. Atherosclerosis. Electronically Signed   By: Vinnie Langton M.D.   On: 01/26/2015 19:35   Ct Head Wo Contrast  01/26/2015  CLINICAL DATA:  Slip and fall. Hit head. Hematoma on the left side of the head. EXAM: CT HEAD WITHOUT CONTRAST TECHNIQUE: Contiguous axial images were obtained from the base of the skull through the vertex without contrast. COMPARISON:  04/17/2009 FINDINGS: Stable cerebral and cerebellar atrophy. No evidence for  acute hemorrhage, mass lesion, midline shift, hydrocephalus or large infarct. Stable areas of white matter disease or encephalomalacia along the frontal lobes near the vertex. There is high-density soft tissue swelling along the left posterior scalp compatible with a scalp hematoma. Negative for a calvarial fracture. Previously identified lesion along the medial left orbit is not well characterized on this examination. IMPRESSION: No acute intracranial abnormality. Stable atrophy. Left posterior scalp hematoma.  No underlying fracture. Electronically Signed  By: Markus Daft M.D.   On: 01/26/2015 18:56   Ct Cervical Spine Wo Contrast  01/28/2015  CLINICAL DATA:  [place] out of bed 2 days ago. Neck pain and decreased range of motion. Initial encounter. EXAM: CT CERVICAL SPINE WITHOUT CONTRAST TECHNIQUE: Multidetector CT imaging of the cervical spine was performed without intravenous contrast. Multiplanar CT image reconstructions were also generated. COMPARISON:  None. FINDINGS: No evidence of acute fracture, subluxation, or prevertebral soft tissue swelling. Moderate degenerative disc disease seen at C5-6. Mild bilateral facet DJD seen at C7-T1. Mild atlantoaxial degenerative changes also noted. Cervical kyphosis also seen, which may be due patient positioning or muscle spasm. IMPRESSION: No evidence of acute cervical spine fracture or subluxation. Cervical kyphosis, which may be related to patient positioning or muscle spasm; clinical correlation is recommended. Degenerative spondylosis, as described above. Electronically Signed   By: Earle Gell M.D.   On: 01/28/2015 13:59    EKG: Independently reviewed.   Assessment/Plan Active Problems:   Chronic diastolic congestive heart failure (HCC)   Thrombocytopenia (HCC)   Acute renal failure (HCC)   S/P mitral valve replacement with bioprosthetic valve - 29 mm Biocor   Permanent atrial fibrillation (HCC)   Pneumonia, community acquired   PNA  (pneumonia)  1-PNA, community acquired:  Continue with Levaquin.  Will check sputum culture.   2-Chronic diastolic HF exacerbation.  Patient with positive JVD, Crackles. Will check BNP.  Cardiology consulted for recommendations. Will follow cardio recommendations.  Discussed with Dr Cathie Olden no need for lasix at this point.   3-HTN; hold Norvasc SBP soft,   4-A fib; continue with metoprolol.   5-Encephalopathy; patient with confusion, suspect related to infection, PNA. Will repeat CT head due to recent fall to rule out subdural hematoma.  Patient Need PT evaluation.   Code Status: Full Code, son will review living will  DVT Prophylaxis: SCD, repeat ct head rule out intracranial bleed.  Family Communication: Care discussed with son who was at bedside.  Disposition Plan: Expect 2 to 4 days inpatient.   Time spent: 75 minutes.   Niel Hummer A [facility] Pager [phone]

## 2015-01-28 NOTE — Consult Note (Signed)
CONSULT NOTE  Date: 01/28/2015               Patient Name:  [name] MRN: [id number]  DOB: December 05, 1929 Age / Sex: 81 y.o., female        PCP:  Melinda Crutch Primary Cardiologist: Croitoru            Referring Physician: Tyrell Antonio              Reason for Consult: Dyspnea, pneumonia , possible CHF            History of Present Illness: Patient is a 81 y.o. female who presents for Permanent atrial fibrillation, pacemaker check, valvular heart disease status post mitral valve biological prosthesis , worsening lower extremity edema and dyspnea.  She has had increasing leg edema for months .  A graph of her weights shows that she has progressively lost weight over the past year. Has had  MV replacement ( biosprosthetic )  Has been on lasix for several years   She has had bronchitis for the several days.   No fever, cough - unproductive . + PND , also has shoulder pain when lying down.  Main issue is that he is so weak that she can no longer get around in her house. Mild achey, slight runny nose.   Echo from 2014 shows normal LV systolic function   She has permanent atrial fib    Medications: Outpatient medications:  (Not in a hospital admission)  Current medications: Current Facility-Administered Medications  Medication Dose Route Frequency Provider Last Rate Last Dose  . levofloxacin (LEVAQUIN) IVPB 750 mg  750 mg Intravenous Once Rebecka Apley, [facility]      . [START ON 01/30/2015] levofloxacin (LEVAQUIN) IVPB 750 mg  750 mg Intravenous Q48H Meagan A Decker, [facility]       Current Outpatient Prescriptions  Medication Sig Dispense Refill  . ALPRAZolam (XANAX) 0.5 MG tablet Take 0.5 mg by mouth at bedtime.    Marland Kitchen amLODipine-olmesartan (AZOR) 5-20 MG per tablet Take 1 tablet by mouth daily. 21 tablet 0  . aspirin EC 81 MG tablet Take 81 mg by mouth every morning.    . metoprolol tartrate (LOPRESSOR) 25 MG tablet Take 12.5 mg by mouth 2 (two) times daily.     Marland Kitchen  azithromycin (ZITHROMAX) 250 MG tablet Take 1 tablet (250 mg total) by mouth daily. Take 1 tablet every day until finished. You received her first dose in the emergency department on 01/26/2015. 4 tablet 0  . B Complex Vitamins (VITAMIN B COMPLEX PO) Take 1 tablet by mouth every morning.     . furosemide (LASIX) 40 MG tablet Take 40 mg by mouth every morning.     . Multiple Vitamin (MULTIVITAMIN WITH MINERALS) TABS Take 1 tablet by mouth every morning.    Marland Kitchen omeprazole (PRILOSEC) 20 MG capsule Take 20 mg by mouth every morning.     . potassium chloride SA (K-DUR,KLOR-CON) 20 MEQ tablet Take 20 mEq by mouth daily after supper.     . simvastatin (ZOCOR) 20 MG tablet Take 10 mg by mouth daily after supper.        No Known Allergies   Past Medical History  Diagnosis Date  . CHF (congestive heart failure) ([place])   . Edema of both legs 04-23-2013  pt states feet swelling (wears ted hose)    bilateral venous ablation procedures done in IR, L>R  . Hypertension   . Venous insufficiency, peripheral   .  H/O hiatal hernia   . Anxiety   . Depression   . Arthritis   . Permanent atrial fibrillation ([place])     CARDIOLOGIST-- DR NO:[id number]  . Sick sinus syndrome ([place])     S/P  PACEMAKER  2009  . S/P mitral valve replacement with bioprosthetic valve     04-03-2009  . S/P cardiac pacemaker procedure     LAST PACER CHECK  04-23-2013  IN [facility]  . History of kidney stones   . Right ureteral stone   . Thrombocytopenia, immune     CHRONIC---  HEMOTOLOGIST---  DR ENNEVER  . Osteopenia   . Benign essential tremor   . Schatzki's ring   . Hyperparathyroidism ([place])     MILD--  STABLE  . Hyperlipidemia   . GERD (gastroesophageal reflux disease)   . History of basal cell carcinoma excision     [place]  . Wears glasses     Past Surgical History  Procedure Laterality Date  . Hand surgery Right 05/2011  . Permanent pacemaker insertion  02/26/2007    Dual Chamber [employer] Programmed VVIR--- Generator  ADDR01/  [id number]   . Cystoscopy w/ ureteral stent placement Left 08/01/2012    Procedure: CYSTOSCOPY WITH RETROGRADE PYELOGRAM/URETERAL STENT PLACEMENT;  Surgeon: Dutch Gray, MD;  Location: [facility];  Service: Urology;  Laterality: Left;  . Cystoscopy with ureteroscopy Left 08/14/2012    Procedure: LEFT URETEROSCOPY WITH HOLMIUM LASER AND LEFT  STENT PLACEMENT;  Surgeon: Malka So, MD;  Location: [facility];  Service: Urology;  Laterality: Left;  . Holmium laser application Left [id number]    Procedure:  HOLMIUM LASER APPLICATION;  Surgeon: Malka So, MD;  Location: [facility];  Service: Urology;  Laterality: Left;  . Nm myocar perf wall motion  03/01/1999    mild lateral ischemia  . Mitral valve replacement with chordal presevation using bioprosthetic valve/  ligation left atrial appendage/  left-sided maze procedure  04-03-2009  DR Ethel Rana,  SERIAL # [id number]  . Transthoracic echocardiogram  09-07-2012  DR CROITORU    MILD LVH/  EF 55-60%/  MILD TO MODERATE AR/   SEVERE LAE  &  RAE/  MILD TO MODERATE TR/   MITRAL VALVE BIOPROSTHESIS LEAFLET NORMAL,  MODERATE STENOSIS WITH NO SIG. REGURG/  MODERATE RVE  . Cardiac catheterization  03/27/2009    normal coronaries; grade IV Mitral Insuff.  . Cardiac catheterization  2001  &  2006  . Vaginal hysterectomy  1982  . Extracorporeal shock wave lithotripsy Left 07-26-2012  . Cystoscopy with ureteroscopy and stent placement Right 04/25/2013    Procedure: RIGHT URETEROSCOPY WITH  STENT PLACEMENT;  Surgeon: Irine Seal, MD;  Location: [facility];  Service: Urology;  Laterality: Right;  . Holmium laser application Right [id number]    Procedure: HOLMIUM LASER APPLICATION;  Surgeon: Irine Seal, MD;  Location: [facility];  Service: Urology;  Laterality: Right;    Family History  Problem Relation Age of Onset  . Cancer Mother   . Heart attack Father     Social History:  reports that she has never smoked. She has  never used smokeless tobacco. She reports that she does not drink alcohol or use illicit drugs.   Review of Systems: Constitutional:  denies fever, chills, diaphoresis, appetite change and fatigue.  HEENT: denies photophobia, eye pain, redness, hearing loss, ear pain, congestion, sore throat, rhinorrhea, sneezing, neck pain, neck stiffness and tinnitus.  Respiratory: admits to  SOB, DOE, cough, chest tightness,    Cardiovascular: denies chest pain, palpitations and leg swelling.  Gastrointestinal: denies nausea, vomiting, abdominal pain, diarrhea, constipation, blood in stool.  Genitourinary: denies dysuria, urgency, frequency, hematuria, flank pain and difficulty urinating.  Musculoskeletal: denies  myalgias, back pain, joint swelling, arthralgias and gait problem.   Skin: denies pallor, rash and wound.  Neurological: denies dizziness, seizures, syncope, weakness, light-headedness, numbness and headaches.   Hematological: denies adenopathy, easy bruising, personal or family bleeding history.  Psychiatric/ Behavioral: denies suicidal ideation, mood changes, confusion, nervousness, sleep disturbance and agitation.    Physical Exam: BP 113/85 mmHg  Pulse 94  Temp(Src) 98 F (36.7 C) (Oral)  Resp 18  SpO2 94%  Wt Readings from Last 3 Encounters:  01/26/15 165 lb (74.844 kg)  11/19/14 167 lb (75.751 kg)  11/05/14 180 lb 12.8 oz (82.01 kg)    General: Vital signs reviewed and noted. Well-developed, well-nourished, in no acute distress; alert,   Head: Normocephalic, atraumatic, sclera anicteric,   Neck: Supple. Negative for carotid bruits. No JVD   Lungs:  Bilateral rhonchi, wheezes   Heart: Irreg Irreg,  Soft systolic CHF   Abdomen/ GI :  Soft, non-tender, non-distended with normoactive bowel sounds. No hepatomegaly. No rebound/guarding. No obvious abdominal masses   MSK: Strength and the appear normal for age. ,  Decreased skin turgur  Extremities: No clubbing or cyanosis. No edema.   Distal pedal pulses are 2+ and equal   Neurologic:  CN are grossly intact,  No obvious motor or sensory defect.  Alert and oriented X 3. Moves all extremities spontaneously.  Psych: Responds to questions appropriately with a normal affect.     Lab results: Basic Metabolic Panel:  Recent Labs Lab 01/28/15 1320  NA 142  K 5.1  CL 106  CO2 29  GLUCOSE 126*  BUN 21*  CREATININE 1.35*  CALCIUM 10.5*    Liver Function Tests:  Recent Labs Lab 01/28/15 1320  AST 43*  ALT 20  ALKPHOS 43  BILITOT 0.6  PROT 6.2*  ALBUMIN 3.4*   No results for input(s): LIPASE, AMYLASE in the last 168 hours. No results for input(s): AMMONIA in the last 168 hours.  CBC:  Recent Labs Lab 01/28/15 1320  WBC 7.0  NEUTROABS 4.9  HGB 13.1  HCT 41.7  MCV 98.3  PLT 81*    Cardiac Enzymes: No results for input(s): CKTOTAL, CKMB, CKMBINDEX, TROPONINI in the last 168 hours.  BNP: Invalid input(s): POCBNP  CBG: No results for input(s): GLUCAP in the last 168 hours.  Coagulation Studies: No results for input(s): LABPROT, INR in the last 72 hours.   Other results: Personal review of EKG shows :   Atrial fib   Imaging: Dg Chest 2 View  01/28/2015  CLINICAL DATA:  Pneumonia, cough for 3 days, history CHF, hypertension, atrial fibrillation, MVR EXAM: CHEST  2 VIEW COMPARISON:  01/26/2015 FINDINGS: LEFT subclavian transvenous pacemaker leads project at RIGHT atrium and RIGHT ventricle, unchanged. Enlargement of cardiac silhouette post median sternotomy. Atherosclerotic calcification aorta. Mediastinal contours and pulmonary vascularity normal. Bronchitic changes with hyperinflation. Persistent LEFT lower lobe infiltrate. No definite pleural effusion or pneumothorax. Bones demineralized. IMPRESSION: Persistent LEFT lower lobe infiltrate. Followup radiographs until resolution recommended to exclude underlying abnormalities in the LEFT lower lobe. Enlargement of cardiac silhouette post MVR and  pacemaker. Electronically Signed   By: Lavonia Dana M.D.   On: 01/28/2015 16:07   Dg Chest 2 View  01/26/2015  CLINICAL DATA:  81 year old female with history of cough. Possible bronchitis. Hypertension. EXAM: CHEST  2 VIEW COMPARISON:  Chest x-rays 08/13/2013. FINDINGS: Left lower lobe airspace consolidation. Elevated left hemidiaphragm. Right lung is clear. No pleural effusions. No evidence of pulmonary edema. Heart size is mildly enlarged (unchanged). The patient is rotated to the left on today's exam, resulting in distortion of the mediastinal contours and reduced diagnostic sensitivity and specificity for mediastinal pathology. Atherosclerosis in the thoracic aorta. Left-sided pacemaker device with lead tips projecting over the expected location of the right atrium and right ventricular apex. IMPRESSION: 1. New area of airspace consolidation in the left lower lobe concerning for pneumonia. Followup PA and lateral chest X-ray is recommended in 3-4 weeks following trial of antibiotic therapy to ensure resolution and exclude underlying malignancy. 2. Mild cardiomegaly. 3. Atherosclerosis. Electronically Signed   By: Vinnie Langton M.D.   On: 01/26/2015 19:35   Ct Head Wo Contrast  01/26/2015  CLINICAL DATA:  Slip and fall. Hit head. Hematoma on the left side of the head. EXAM: CT HEAD WITHOUT CONTRAST TECHNIQUE: Contiguous axial images were obtained from the base of the skull through the vertex without contrast. COMPARISON:  04/17/2009 FINDINGS: Stable cerebral and cerebellar atrophy. No evidence for acute hemorrhage, mass lesion, midline shift, hydrocephalus or large infarct. Stable areas of white matter disease or encephalomalacia along the frontal lobes near the vertex. There is high-density soft tissue swelling along the left posterior scalp compatible with a scalp hematoma. Negative for a calvarial fracture. Previously identified lesion along the medial left orbit is not well characterized on this  examination. IMPRESSION: No acute intracranial abnormality. Stable atrophy. Left posterior scalp hematoma.  No underlying fracture. Electronically Signed   By: Markus Daft M.D.   On: 01/26/2015 18:56   Ct Cervical Spine Wo Contrast  01/28/2015  CLINICAL DATA:  [place] out of bed 2 days ago. Neck pain and decreased range of motion. Initial encounter. EXAM: CT CERVICAL SPINE WITHOUT CONTRAST TECHNIQUE: Multidetector CT imaging of the cervical spine was performed without intravenous contrast. Multiplanar CT image reconstructions were also generated. COMPARISON:  None. FINDINGS: No evidence of acute fracture, subluxation, or prevertebral soft tissue swelling. Moderate degenerative disc disease seen at C5-6. Mild bilateral facet DJD seen at C7-T1. Mild atlantoaxial degenerative changes also noted. Cervical kyphosis also seen, which may be due patient positioning or muscle spasm. IMPRESSION: No evidence of acute cervical spine fracture or subluxation. Cervical kyphosis, which may be related to patient positioning or muscle spasm; clinical correlation is recommended. Degenerative spondylosis, as described above. Electronically Signed   By: Earle Gell M.D.   On: 01/28/2015 13:59        Assessment & Plan:  1. Dyspnea:  This seems like bronchitis / pneumonia. She has decreased skin turgur and no leg edema.   She has received a liter of NS in the ER and is feeling better. Her weight has gradually decreased through the past year  ( see weight graph)   I would treat her with ABx and also nebs -  There is no evidence of volume overload and I would not treat her with additional Lasix.   I would treat her pneumonia adequatly.  2. Generalized weakness / failure to thrive :     This is the main reason that her family member brought her in today.   She is unable to get to the bathroom independently.   This may improve with treatment of the pneumonia but she may also need to  consider SNF.      Thayer Headings,  Brooke Bonito., MD, [facility] 01/28/2015, 5:21 PM Office - [phone] Pager [phone]

## 2015-01-28 NOTE — ED Provider Notes (Signed)
CSN: [id number]     Arrival date & time 01/28/15  1258 History   First MD Initiated Contact with Patient 01/28/15 1425     Chief Complaint  Patient presents with  . Fall  . Neck Pain     (Consider location/radiation/quality/duration/timing/severity/associated sxs/prior Treatment) HPI Comments: [name] is a 81 y.o. female with a PMHx of CHF, HTN, sick sinus syndrome s/p pacemaker, hiatal hernia, Afib, chronic thrombocytopenia, and other medical conditions listed below, who presents to the ED with complaints of neck pain that began 2 days ago after she fell. Chart review reveals she was seen here on 12/12 for a fall from standing, states she got out of bed to answer the phone and fell, landing on hardwood floor, no LOC. She also had a productive cough and was sent home with abx for a PNA.  Patient's family at bedside reports that she has become more weak, now unable to ambulate even with her walker, and they are unable to care for her appropriately at home.  They state that her cough is worsening, and Dr. Harrington Challenger at [facility] sent her here to be admitted.  Patient describes her neck pain is 9/10 intermittent throbbing pain bilaterally which is nonradiating, worse with neck movement, with no treatments tried prior to arrival.  Additionally she describes her cough as barky sounding without sputum production.  Associated symptoms include wheezing.  She denies any fevers, chills, chest pain, shortness of breath, abdominal pain, nausea, vomiting, diarrhea, constipation, non-, hematochezia, dysuria, hematuria, numbness, tingling, vision changes, hearing loss, tinnitus, syncope, or altered mental status.  Patient is a 81 y.o. female presenting with fall and neck pain. The history is provided by the patient, medical records and a caregiver. No language interpreter was used.  Fall Associated symptoms include coughing, fatigue, neck pain and weakness (generalized). Pertinent negatives include no abdominal  pain, arthralgias, chest pain, chills, fever, myalgias, nausea, numbness, visual change or vomiting.  Neck Pain Pain location:  Generalized neck Quality: throbbing. Pain radiates to:  Does not radiate Pain severity:  Moderate Pain is:  Same all the time Onset quality:  Gradual Duration:  2 days Timing:  Intermittent Progression:  Unchanged Chronicity:  New Context: fall   Relieved by:  None tried Worsened by:  Position and bending Ineffective treatments:  None tried Associated symptoms: weakness (generalized)   Associated symptoms: no bladder incontinence, no bowel incontinence, no chest pain, no fever, no numbness, no paresis, no syncope, no tingling and no visual change     Past Medical History  Diagnosis Date  . CHF (congestive heart failure) ([place])   . Edema of both legs 04-23-2013  pt states feet swelling (wears ted hose)    bilateral venous ablation procedures done in IR, L>R  . Hypertension   . Venous insufficiency, peripheral   . H/O hiatal hernia   . Anxiety   . Depression   . Arthritis   . Permanent atrial fibrillation ([place])     CARDIOLOGIST-- DR [id number]  . Sick sinus syndrome ([place])     S/P  PACEMAKER  2009  . S/P mitral valve replacement with bioprosthetic valve     04-03-2009  . S/P cardiac pacemaker procedure     LAST PACER CHECK  04-23-2013  IN [facility]  . History of kidney stones   . Right ureteral stone   . Thrombocytopenia, immune     CHRONIC---  HEMOTOLOGIST---  DR ENNEVER  . Osteopenia   . Benign essential tremor   .  Schatzki's ring   . Hyperparathyroidism ([place])     MILD--  STABLE  . Hyperlipidemia   . GERD (gastroesophageal reflux disease)   . History of basal cell carcinoma excision     [place]  . Wears glasses    Past Surgical History  Procedure Laterality Date  . Hand surgery Right 05/2011  . Permanent pacemaker insertion  02/26/2007    Dual Chamber [employer] Programmed VVIR--- Generator [id number]/  [id number]   . Cystoscopy w/ ureteral  stent placement Left 08/01/2012    Procedure: CYSTOSCOPY WITH RETROGRADE PYELOGRAM/URETERAL STENT PLACEMENT;  Surgeon: Dutch Gray, MD;  Location: [facility];  Service: Urology;  Laterality: Left;  . Cystoscopy with ureteroscopy Left 08/14/2012    Procedure: LEFT URETEROSCOPY WITH HOLMIUM LASER AND LEFT  STENT PLACEMENT;  Surgeon: Malka So, MD;  Location: [facility];  Service: Urology;  Laterality: Left;  . Holmium laser application Left [id number]    Procedure:  HOLMIUM LASER APPLICATION;  Surgeon: Malka So, MD;  Location: [facility];  Service: Urology;  Laterality: Left;  . Nm myocar perf wall motion  03/01/1999    mild lateral ischemia  . Mitral valve replacement with chordal presevation using bioprosthetic valve/  ligation left atrial appendage/  left-sided maze procedure  04-03-2009  DR Ethel Rana,  SERIAL # [id number]  . Transthoracic echocardiogram  09-07-2012  DR CROITORU    MILD LVH/  EF 55-60%/  MILD TO MODERATE AR/   SEVERE LAE  &  RAE/  MILD TO MODERATE TR/   MITRAL VALVE BIOPROSTHESIS LEAFLET NORMAL,  MODERATE STENOSIS WITH NO SIG. REGURG/  MODERATE RVE  . Cardiac catheterization  03/27/2009    normal coronaries; grade IV Mitral Insuff.  . Cardiac catheterization  2001  &  2006  . Vaginal hysterectomy  1982  . Extracorporeal shock wave lithotripsy Left 07-26-2012  . Cystoscopy with ureteroscopy and stent placement Right 04/25/2013    Procedure: RIGHT URETEROSCOPY WITH  STENT PLACEMENT;  Surgeon: Irine Seal, MD;  Location: [facility];  Service: Urology;  Laterality: Right;  . Holmium laser application Right [id number]    Procedure: HOLMIUM LASER APPLICATION;  Surgeon: Irine Seal, MD;  Location: [facility];  Service: Urology;  Laterality: Right;   Family History  Problem Relation Age of Onset  . Cancer Mother   . Heart attack Father    Social History  Substance Use Topics  . Smoking status: Never Smoker   . Smokeless tobacco: Never Used      Comment: never used tobacco  . Alcohol Use: No   OB History    No data available     Review of Systems  Constitutional: Positive for fatigue. Negative for fever and chills.  HENT: Negative for hearing loss and tinnitus.   Respiratory: Positive for cough and wheezing. Negative for shortness of breath.   Cardiovascular: Negative for chest pain and syncope.  Gastrointestinal: Negative for nausea, vomiting, abdominal pain, diarrhea, constipation, blood in stool and bowel incontinence.  Genitourinary: Negative for bladder incontinence, dysuria and hematuria.  Musculoskeletal: Positive for neck pain. Negative for myalgias and arthralgias.  Skin: Positive for color change (bruise to scalp).  Allergic/Immunologic: Negative for immunocompromised state.  Neurological: Positive for weakness (generalized). Negative for tingling, syncope and numbness.  Psychiatric/Behavioral: Negative for confusion.   10 Systems reviewed and are negative for acute change except as noted in the HPI.    Allergies  Review of patient's allergies indicates no known  allergies.  Home Medications   Prior to Admission medications   Medication Sig Start Date End Date Taking? Authorizing Provider  ALPRAZolam Duanne Moron) 0.5 MG tablet Take 0.5 mg by mouth at bedtime.    Historical Provider, MD  amLODipine-olmesartan (AZOR) 5-20 MG per tablet Take 1 tablet by mouth daily. 05/06/14   Mihai Croitoru, MD  aspirin EC 81 MG tablet Take 81 mg by mouth every morning.    Historical Provider, MD  azithromycin (ZITHROMAX) 250 MG tablet Take 1 tablet (250 mg total) by mouth daily. Take 1 tablet every day until finished. You received her first dose in the emergency department on 01/26/2015. 01/26/15   Ottie Glazier, PA-C  B Complex Vitamins (VITAMIN B COMPLEX PO) Take 1 tablet by mouth every morning.     Historical Provider, MD  furosemide (LASIX) 40 MG tablet Take 40 mg by mouth every morning.     Historical Provider, MD  metoprolol  tartrate (LOPRESSOR) 25 MG tablet Take 12.5 mg by mouth 2 (two) times daily.     Historical Provider, MD  Multiple Vitamin (MULTIVITAMIN WITH MINERALS) TABS Take 1 tablet by mouth every morning.    Historical Provider, MD  omeprazole (PRILOSEC) 20 MG capsule Take 20 mg by mouth every morning.     Historical Provider, MD  potassium chloride SA (K-DUR,KLOR-CON) 20 MEQ tablet Take 20 mEq by mouth daily after supper.     Historical Provider, MD  simvastatin (ZOCOR) 20 MG tablet Take 10 mg by mouth daily after supper.     Historical Provider, MD   BP 100/69 mmHg  Pulse 95  Temp(Src) 98 F (36.7 C) (Oral)  Resp 18  SpO2 91% Physical Exam  Constitutional: She is oriented to person, place, and time. Vital signs are normal. She appears well-developed and well-nourished.  Non-toxic appearance. No distress.  Afebrile, nontoxic, NAD, thin and frail appearing  HENT:  Head: Normocephalic and atraumatic.  Mouth/Throat: Oropharynx is clear and moist and mucous membranes are normal.  Posterior scalp hematoma  Eyes: Conjunctivae and EOM are normal. Right eye exhibits no discharge. Left eye exhibits no discharge.  Neck: Neck supple. Muscular tenderness present. No spinous process tenderness present. No rigidity. Decreased range of motion (due to pain) present.    Limited ROM due to pain, without spinous process TTP, no bony stepoffs or deformities, with mild b/l paraspinous muscle TTP and palpable muscle spasms. No bruising or swelling.   Cardiovascular: Normal rate, normal heart sounds and intact distal pulses.  An irregularly irregular rhythm present. Exam reveals no gallop and no friction rub.   No murmur heard. Irregularly irregular rhythm, regular rate, nl s1/s2, no m/r/g, distal pulses intact  Pulmonary/Chest: Effort normal. No respiratory distress. She has no decreased breath sounds. She has wheezes. She has rhonchi. She has no rales.  Scattered rhonchi and wheezing throughout all lung fields with  worst rhonchi noted in LLL, no rales, no hypoxia or increased WOB, speaking in full sentences, SpO2 91% on RA   Abdominal: Soft. Normal appearance and bowel sounds are normal. She exhibits no distension. There is no tenderness. There is no rigidity, no rebound and no guarding.  Musculoskeletal:  Difficulty lifting herself up in bed, but grip strength symmetric bilaterally and dorsiflexion/plantarflexion equal bilaterally. Did not attempt to ambulate during exam due to pt feeling weak. Sensation grossly intact in all extremities, MAE x4, distal pulses intact.  Neurological: She is alert and oriented to person, place, and time. She has normal strength. No sensory deficit.  Skin: Skin is warm, dry and intact. No rash noted.  Psychiatric: She has a normal mood and affect.  Nursing note and vitals reviewed.   ED Course  Procedures (including critical care time) Labs Review Labs Reviewed  CBC WITH DIFFERENTIAL/PLATELET - Abnormal; Notable for the following:    Platelets 81 (*)    All other components within normal limits  COMPREHENSIVE METABOLIC PANEL - Abnormal; Notable for the following:    Glucose, Bld 126 (*)    BUN 21 (*)    Creatinine, Ser 1.35 (*)    Calcium 10.5 (*)    Total Protein 6.2 (*)    Albumin 3.4 (*)    AST 43 (*)    GFR calc non Af Amer 35 (*)    GFR calc Af Amer 41 (*)    All other components within normal limits  URINALYSIS, ROUTINE W REFLEX MICROSCOPIC (NOT AT [facility])  I-STAT CG4 LACTIC ACID, ED  I-STAT CG4 LACTIC ACID, ED    Imaging Review Dg Chest 2 View  01/28/2015  CLINICAL DATA:  Pneumonia, cough for 3 days, history CHF, hypertension, atrial fibrillation, MVR EXAM: CHEST  2 VIEW COMPARISON:  01/26/2015 FINDINGS: LEFT subclavian transvenous pacemaker leads project at RIGHT atrium and RIGHT ventricle, unchanged. Enlargement of cardiac silhouette post median sternotomy. Atherosclerotic calcification aorta. Mediastinal contours and pulmonary vascularity normal.  Bronchitic changes with hyperinflation. Persistent LEFT lower lobe infiltrate. No definite pleural effusion or pneumothorax. Bones demineralized. IMPRESSION: Persistent LEFT lower lobe infiltrate. Followup radiographs until resolution recommended to exclude underlying abnormalities in the LEFT lower lobe. Enlargement of cardiac silhouette post MVR and pacemaker. Electronically Signed   By: Lavonia Dana M.D.   On: 01/28/2015 16:07   Dg Chest 2 View  01/26/2015  CLINICAL DATA:  81 year old female with history of cough. Possible bronchitis. Hypertension. EXAM: CHEST  2 VIEW COMPARISON:  Chest x-rays 08/13/2013. FINDINGS: Left lower lobe airspace consolidation. Elevated left hemidiaphragm. Right lung is clear. No pleural effusions. No evidence of pulmonary edema. Heart size is mildly enlarged (unchanged). The patient is rotated to the left on today's exam, resulting in distortion of the mediastinal contours and reduced diagnostic sensitivity and specificity for mediastinal pathology. Atherosclerosis in the thoracic aorta. Left-sided pacemaker device with lead tips projecting over the expected location of the right atrium and right ventricular apex. IMPRESSION: 1. New area of airspace consolidation in the left lower lobe concerning for pneumonia. Followup PA and lateral chest X-ray is recommended in 3-4 weeks following trial of antibiotic therapy to ensure resolution and exclude underlying malignancy. 2. Mild cardiomegaly. 3. Atherosclerosis. Electronically Signed   By: Vinnie Langton M.D.   On: 01/26/2015 19:35   Ct Head Wo Contrast  01/26/2015  CLINICAL DATA:  Slip and fall. Hit head. Hematoma on the left side of the head. EXAM: CT HEAD WITHOUT CONTRAST TECHNIQUE: Contiguous axial images were obtained from the base of the skull through the vertex without contrast. COMPARISON:  04/17/2009 FINDINGS: Stable cerebral and cerebellar atrophy. No evidence for acute hemorrhage, mass lesion, midline shift,  hydrocephalus or large infarct. Stable areas of white matter disease or encephalomalacia along the frontal lobes near the vertex. There is high-density soft tissue swelling along the left posterior scalp compatible with a scalp hematoma. Negative for a calvarial fracture. Previously identified lesion along the medial left orbit is not well characterized on this examination. IMPRESSION: No acute intracranial abnormality. Stable atrophy. Left posterior scalp hematoma.  No underlying fracture. Electronically Signed   By: Quita Skye  Anselm Pancoast M.D.   On: 01/26/2015 18:56   Ct Cervical Spine Wo Contrast  01/28/2015  CLINICAL DATA:  [place] out of bed 2 days ago. Neck pain and decreased range of motion. Initial encounter. EXAM: CT CERVICAL SPINE WITHOUT CONTRAST TECHNIQUE: Multidetector CT imaging of the cervical spine was performed without intravenous contrast. Multiplanar CT image reconstructions were also generated. COMPARISON:  None. FINDINGS: No evidence of acute fracture, subluxation, or prevertebral soft tissue swelling. Moderate degenerative disc disease seen at C5-6. Mild bilateral facet DJD seen at C7-T1. Mild atlantoaxial degenerative changes also noted. Cervical kyphosis also seen, which may be due patient positioning or muscle spasm. IMPRESSION: No evidence of acute cervical spine fracture or subluxation. Cervical kyphosis, which may be related to patient positioning or muscle spasm; clinical correlation is recommended. Degenerative spondylosis, as described above. Electronically Signed   By: Earle Gell M.D.   On: 01/28/2015 13:59   I have personally reviewed and evaluated these images and lab results as part of my medical decision-making.   EKG Interpretation None      MDM   Final diagnoses:  Neck pain, bilateral  Neck muscle spasm  Weakness  Cough  Wheezing  Community acquired pneumonia  Thrombocytopenia ([place])    81 y.o. female here with worsening weakness, cough, and neck pain after a fall 2  days ago. Family at bedside stating they can't take care of her and that she can't ambulate on her own due to weakness. Previously used a walker to ambulate. States she was sent here by her PCP for worsening pneumonia, on zpak after ED visit 2 days ago but isn't improving. Lung sounds with wheezing and rhonchi in all lung fields which don't clear with cough. CXR the other day showing LLL PNA. SpO2 91% on RA today, two days ago was 97-100% on RA. Pt barely able to lift herself up in bed, could not ambulate during exam due to pt stating she's weak. Additionally, neck spasms bilaterally with mild tenderness, no midline tenderness. Labs here reveal normal lactic, CBC w/diff showing chronic thrombocytopenia, CMP with chronic baseline kidney insufficiency. CT neck showing kyphosis likely from spasms and degenerative spondylosis. Will give tylenol, want to avoid NSAIDs due to kidney function, avoid narcotics/muscle relaxants due to pt's weakness. All extremities NVI. Will give nebs and fluids, and get U/A. Will await CXR to see if PNA appears worsened. Awaiting PCP to send notes regarding his desire to admit pt. Will reassess shortly.   4:00 PM CXR just now obtained, on my reading looks slightly worse with possible RLL opacity, official reading not yet done but given that pt is weak with worsening cough, and worsening oxygen saturations at rest, will proceed with admission. U/A also pending. Pt just now receiving nebs. Will consult for admission.  4:10 PM CXR showing persisting LLL infiltrate, mild enlargement of cardiac silhouette. Also shows bronchitic changes with hyperinflation which wasn't noted on prior CXR. Will order levaquin. Awaiting return page for admission.  4:24 PM Dr. Tyrell Antonio returning page, please see her notes for further documentation of care. Bed order placed.   BP 100/69 mmHg  Pulse 95  Temp(Src) 98 F (36.7 C) (Oral)  Resp 18  SpO2 91%  Meds ordered this encounter  Medications  .  ipratropium (ATROVENT) nebulizer solution 0.5 mg    Sig:   . levalbuterol (XOPENEX) nebulizer solution 1.25 mg    Sig:   . sodium chloride 0.9 % bolus 1,000 mL    Sig:   . acetaminophen (  TYLENOL) tablet 500 mg    Sig:   . levofloxacin (LEVAQUIN) IVPB 750 mg    Sig:     Order Specific Question:  Antibiotic Indication:    Answer:  CAP     Christina Aversa Camprubi-Soms, PA-C 01/28/15 1624  Sherwood Gambler, MD 01/29/15 [phone]

## 2015-01-28 NOTE — ED Notes (Signed)
Vital signs stable. 

## 2015-01-29 ENCOUNTER — Encounter (HOSPITAL_COMMUNITY): Payer: Self-pay | Admitting: General Practice

## 2015-01-29 ENCOUNTER — Telehealth: Payer: Self-pay | Admitting: Internal Medicine

## 2015-01-29 DIAGNOSIS — I482 Chronic atrial fibrillation: Secondary | ICD-10-CM

## 2015-01-29 DIAGNOSIS — J189 Pneumonia, unspecified organism: Secondary | ICD-10-CM

## 2015-01-29 DIAGNOSIS — D696 Thrombocytopenia, unspecified: Secondary | ICD-10-CM

## 2015-01-29 HISTORY — DX: Pneumonia, unspecified organism: J18.9

## 2015-01-29 LAB — CBC
HCT: 35.1 % — ABNORMAL LOW (ref 36.0–46.0)
Hemoglobin: 10.8 g/dL — ABNORMAL LOW (ref 12.0–15.0)
MCH: 29.9 pg (ref 26.0–34.0)
MCHC: 30.8 g/dL (ref 30.0–36.0)
MCV: 97.2 fL (ref 78.0–100.0)
PLATELETS: 74 10*3/uL — AB (ref 150–400)
RBC: 3.61 MIL/uL — ABNORMAL LOW (ref 3.87–5.11)
RDW: 15.1 % (ref 11.5–15.5)
WBC: 5.6 10*3/uL (ref 4.0–10.5)

## 2015-01-29 LAB — BASIC METABOLIC PANEL
Anion gap: 5 (ref 5–15)
BUN: 19 mg/dL (ref 6–20)
CALCIUM: 9.4 mg/dL (ref 8.9–10.3)
CHLORIDE: 108 mmol/L (ref 101–111)
CO2: 28 mmol/L (ref 22–32)
CREATININE: 1.12 mg/dL — AB (ref 0.44–1.00)
GFR calc Af Amer: 51 mL/min — ABNORMAL LOW (ref >60)
GFR calc non Af Amer: 44 mL/min — ABNORMAL LOW (ref >60)
Glucose, Bld: 87 mg/dL (ref 65–99)
Potassium: 4.2 mmol/L (ref 3.5–5.1)
SODIUM: 141 mmol/L (ref 135–145)

## 2015-01-29 LAB — EXPECTORATED SPUTUM ASSESSMENT W REFEX TO RESP CULTURE

## 2015-01-29 LAB — EXPECTORATED SPUTUM ASSESSMENT W GRAM STAIN, RFLX TO RESP C

## 2015-01-29 MED ORDER — GUAIFENESIN ER 600 MG PO TB12
600.0000 mg | ORAL_TABLET | Freq: Two times a day (BID) | ORAL | Status: DC
  Administered 2015-01-29 – 2015-01-31 (×5): 600 mg via ORAL
  Filled 2015-01-29 (×5): qty 1

## 2015-01-29 MED ORDER — GUAIFENESIN 100 MG/5ML PO SOLN
5.0000 mL | ORAL | Status: DC | PRN
  Administered 2015-01-31 (×2): 100 mg via ORAL
  Filled 2015-01-29 (×2): qty 5

## 2015-01-29 NOTE — Evaluation (Signed)
Physical Therapy Evaluation Patient Details Name: [name] MRN: [id number] DOB: March 15, 1929 Today's Date: 01/29/2015   History of Present Illness  Pt adm with PNA. PMHx of CHF, HTN, sick sinus syndrome s/p pacemaker, Afib  Clinical Impression  Pt admitted with above diagnosis and presents to PT with functional limitations due to deficits listed below (See PT problem list). Pt needs skilled PT to maximize independence and safety to allow discharge to ST-SNF.      Follow Up Recommendations SNF    Equipment Recommendations  None recommended by PT    Recommendations for Other Services       Precautions / Restrictions Precautions Precautions: Fall      Mobility  Bed Mobility Overal bed mobility: Needs Assistance Bed Mobility: Sit to Supine       Sit to supine: Min assist   General bed mobility comments: Assist to bring her legs back into bed  Transfers Overall transfer level: Needs assistance Equipment used: Rolling walker (2 wheeled) Transfers: Sit to/from [facility] Sit to Stand: Mod assist Stand pivot transfers: Mod assist       General transfer comment: Pt very tremulous and assist to bring hips up and control balance with turn.  Ambulation/Gait Ambulation/Gait assistance: Mod assist Ambulation Distance (Feet): 2 Feet Assistive device: Rolling walker (2 wheeled) Gait Pattern/deviations: Step-through pattern;Decreased step length - right;Decreased step length - left;Shuffle;Trunk flexed Gait velocity: decr Gait velocity interpretation: <1.8 ft/sec, indicative of risk for recurrent falls General Gait Details: Pt very anxious with amb. also very tremulous  Stairs            Wheelchair Mobility    Modified Rankin (Stroke Patients Only)       Balance Overall balance assessment: Needs assistance Sitting-balance support: No upper extremity supported;Feet supported Sitting balance-Leahy Scale: Fair     Standing balance  support: Bilateral upper extremity supported Standing balance-Leahy Scale: Poor Standing balance comment: support of walker and min A for static standing.                             Pertinent Vitals/Pain Pain Assessment: No/denies pain    Home Living Family/patient expects to be discharged to:: Private residence Living Arrangements: Spouse/significant other Available Help at Discharge: Family (spouse uses a walker)           Home Equipment: Environmental consultant - 2 wheels;Cane - single point      Prior Function Level of Independence: Needs assistance   Gait / Transfers Assistance Needed: Was amb with walker            Hand Dominance        Extremity/Trunk Assessment   Upper Extremity Assessment: Defer to OT evaluation           Lower Extremity Assessment: Generalized weakness         Communication      Cognition Arousal/Alertness: Awake/alert Behavior During Therapy: Anxious Overall Cognitive Status: Within Functional Limits for tasks assessed                      General Comments      Exercises        Assessment/Plan    PT Assessment Patient needs continued PT services  PT Diagnosis Difficulty walking;Generalized weakness   PT Problem List Decreased strength;Decreased activity tolerance;Decreased balance;Decreased mobility;Decreased knowledge of use of DME  PT Treatment Interventions DME instruction;Gait training;Therapeutic activities;Functional mobility training;Therapeutic exercise;Balance training;Patient/family education  PT Goals (Current goals can be found in the Care Plan section) Acute Rehab PT Goals Patient Stated Goal: return home PT Goal Formulation: With patient Time For Goal Achievement: 02/12/15 Potential to Achieve Goals: Good    Frequency Min 3X/week   Barriers to discharge        Co-evaluation               End of Session Equipment Utilized During Treatment: Gait belt Activity Tolerance: Patient  limited by fatigue Patient left: in bed;with call bell/phone within reach;with bed alarm set Nurse Communication: Mobility status         Time: 1500-1516 PT Time Calculation (min) (ACUTE ONLY): 16 min   Charges:   PT Evaluation $Initial PT Evaluation Tier I: 1 Procedure     PT G Codes:        Gillermo Poch 2015-02-24, 5:22 PM [facility] PT [phone]
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
## 2015-01-29 NOTE — Care Management Note (Signed)
Case Management Note  Patient Details  Name: [name] MRN: [id number] Date of Birth: 1929-11-03  Subjective/Objective:    Date: 01/29/15 Spoke with patient at the bedside. Introduced self as Tourist information centre manager and explained role in discharge planning and how to be reached.  Verified patient lives in town, alone with spouse. Has DME cane that she uses inside and rolling walker  That she uses outside. Expressed potential need for no other DME.  Verified patient anticipates to go home with family, at time of discharge and will have full-time supervision by family at this time to best of their knowledge.  Patient denied needing help with their medication.  Patient  is driven by spouse to MD appointments.  Verified patient has PCP Lona Kettle.  Await pt eval.   Plan: CM will continue to follow for discharge planning and [facility] resources.                 Action/Plan:   Expected Discharge Date:                  Expected Discharge Plan:  [place]  In-House Referral:     Discharge planning Services  CM Consult  Post Acute Care Choice:    Choice offered to:     DME Arranged:    DME Agency:     HH Arranged:    [place] Agency:     Status of Service:  In process, will continue to follow  Medicare Important Message Given:    Date Medicare IM Given:    Medicare IM give by:    Date Additional Medicare IM Given:    Additional Medicare Important Message give by:     If discussed at [place] of Stay Meetings, dates discussed:    Additional Comments:  Zenon Mayo, RN 01/29/2015, 11:55 AM
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
## 2015-01-29 NOTE — Evaluation (Signed)
Clinical/Bedside Swallow Evaluation Patient Details  Name: [name] MRN: [id number] Date of Birth: [birth year]  Today's Date: 01/29/2015 Time: SLP Start Time (ACUTE ONLY): 1051 SLP Stop Time (ACUTE ONLY): 1103 SLP Time Calculation (min) (ACUTE ONLY): 12 min  Past Medical History:  Past Medical History  Diagnosis Date  . CHF (congestive heart failure) ([place])   . Edema of both legs 04-23-2013  pt states feet swelling (wears ted hose)    bilateral venous ablation procedures done in IR, L>R  . Hypertension   . Venous insufficiency, peripheral   . H/O hiatal hernia   . Anxiety   . Depression   . Arthritis   . Permanent atrial fibrillation ([place])     CARDIOLOGIST-- DR [id number]  . Sick sinus syndrome ([place])     S/P  PACEMAKER  2009  . S/P mitral valve replacement with bioprosthetic valve     04-03-2009  . S/P cardiac pacemaker procedure     LAST PACER CHECK  04-23-2013  IN [facility]  . History of kidney stones   . Right ureteral stone   . Thrombocytopenia, immune     CHRONIC---  HEMOTOLOGIST---  DR ENNEVER  . Osteopenia   . Benign essential tremor   . Schatzki's ring   . Hyperparathyroidism ([place])     MILD--  STABLE  . Hyperlipidemia   . GERD (gastroesophageal reflux disease)   . History of basal cell carcinoma excision     foredhead  . Wears glasses    Past Surgical History:  Past Surgical History  Procedure Laterality Date  . Hand surgery Right 05/2011  . Permanent pacemaker insertion  02/26/2007    Dual Chamber [employer] Programmed VVIR--- Generator [id number]  [id number] H   . Cystoscopy w/ ureteral stent placement Left 08/01/2012    Procedure: CYSTOSCOPY WITH RETROGRADE PYELOGRAM/URETERAL STENT PLACEMENT;  Surgeon: Dutch Gray, MD;  Location: [facility];  Service: Urology;  Laterality: Left;  . Cystoscopy with ureteroscopy Left 08/14/2012    Procedure: LEFT URETEROSCOPY WITH HOLMIUM LASER AND LEFT  STENT PLACEMENT;  Surgeon: Malka So, MD;  Location: [facility];  Service: Urology;   Laterality: Left;  . Holmium laser application Left [id number]    Procedure:  HOLMIUM LASER APPLICATION;  Surgeon: Malka So, MD;  Location: [facility];  Service: Urology;  Laterality: Left;  . Nm myocar perf wall motion  03/01/1999    mild lateral ischemia  . Mitral valve replacement with chordal presevation using bioprosthetic valve/  ligation left atrial appendage/  left-sided maze procedure  04-03-2009  DR Ethel Rana,  SERIAL # [id number]  . Transthoracic echocardiogram  09-07-2012  DR CROITORU    MILD LVH/  EF 55-60%/  MILD TO MODERATE AR/   SEVERE LAE  &  RAE/  MILD TO MODERATE TR/   MITRAL VALVE BIOPROSTHESIS LEAFLET NORMAL,  MODERATE STENOSIS WITH NO SIG. REGURG/  MODERATE RVE  . Cardiac catheterization  03/27/2009    normal coronaries; grade IV Mitral Insuff.  . Cardiac catheterization  2001  &  2006  . Vaginal hysterectomy  1982  . Extracorporeal shock wave lithotripsy Left 07-26-2012  . Cystoscopy with ureteroscopy and stent placement Right 04/25/2013    Procedure: RIGHT URETEROSCOPY WITH  STENT PLACEMENT;  Surgeon: Irine Seal, MD;  Location: [facility];  Service: Urology;  Laterality: Right;  . Holmium laser application Right [id number]    Procedure: HOLMIUM LASER APPLICATION;  Surgeon: Irine Seal, MD;  Location: [facility];  Service: Urology;  Laterality: Right;   HPI:  81 y.o. female with a PMHx of CHF, HTN, sick sinus syndrome s/p pacemaker, hiatal hernia, Afib, chronic thrombocytopenia, who present to ED with worsening weakness, inability to ambulate. CXR shows LLL PNA. Pt had MBS in 2011 s/p intubation with recommendation for small bites/sips to increase airawy protection with thin liquids.   Assessment / Plan / Recommendation Clinical Impression  Pt has a congested cough that is present before, during, and after PO intake. It does not appear to increase in frequency throughout PO trials, and vocal quality remains clear post-swallow prior to  coughing. Subjectively, she denies any swallowing difficulties PTA, and prior dysphagia was likely an acute, reversible change in swallowing post-extubation. Recommend continuing current diet with use of small, single bites/sips. SLP to f/u for tolerance.    Aspiration Risk  Mild aspiration risk    Diet Recommendation  Regular diet, thin liquids   Medication Administration: Whole meds with puree    Other  Recommendations Oral Care Recommendations: Oral care BID   Follow up Recommendations   (tba)    Frequency and Duration min 2x/week  2 weeks       Prognosis        Swallow Study   General HPI: 81 y.o. female with a PMHx of CHF, HTN, sick sinus syndrome s/p pacemaker, hiatal hernia, Afib, chronic thrombocytopenia, who present to ED with worsening weakness, inability to ambulate. CXR shows LLL PNA. Pt had MBS in 2011 s/p intubation with recommendation for small bites/sips to increase airawy protection with thin liquids. Type of Study: Bedside Swallow Evaluation Previous Swallow Assessment: see HPI Diet Prior to this Study: Regular;Thin liquids Temperature Spikes Noted: No Respiratory Status: Room air History of Recent Intubation: No Behavior/Cognition: Alert;Cooperative;Pleasant mood;Requires cueing Oral Cavity Assessment: Within Functional Limits Oral Care Completed by SLP: No Oral Cavity - Dentition: Adequate natural dentition Vision: Functional for self-feeding Self-Feeding Abilities: Able to feed self Patient Positioning: Upright in bed Baseline Vocal Quality: Normal Volitional Cough: Congested    Oral/Motor/Sensory Function Overall Oral Motor/Sensory Function: Within functional limits   Ice Chips Ice chips: Not tested   Thin Liquid Thin Liquid: Impaired Presentation: Cup;Self Fed;Straw Pharyngeal  Phase Impairments: Cough - Delayed    Nectar Thick Nectar Thick Liquid: Not tested   Honey Thick Honey Thick Liquid: Not tested   Puree Puree: Impaired Presentation: Self  Fed;Spoon Pharyngeal Phase Impairments: Cough - Delayed   Solid Solid: Impaired Presentation: Self Fed Pharyngeal Phase Impairments: Cough - Delayed      Germain Osgood, M.A. CCC-SLP [phone]  Germain Osgood 01/29/2015,11:14 AM

## 2015-01-30 DIAGNOSIS — N390 Urinary tract infection, site not specified: Secondary | ICD-10-CM

## 2015-01-30 LAB — CBC
HEMATOCRIT: 36.3 % (ref 36.0–46.0)
Hemoglobin: 11.5 g/dL — ABNORMAL LOW (ref 12.0–15.0)
MCH: 30.8 pg (ref 26.0–34.0)
MCHC: 31.7 g/dL (ref 30.0–36.0)
MCV: 97.3 fL (ref 78.0–100.0)
Platelets: 78 10*3/uL — ABNORMAL LOW (ref 150–400)
RBC: 3.73 MIL/uL — ABNORMAL LOW (ref 3.87–5.11)
RDW: 15 % (ref 11.5–15.5)
WBC: 6 10*3/uL (ref 4.0–10.5)

## 2015-01-30 LAB — BASIC METABOLIC PANEL
Anion gap: 5 (ref 5–15)
BUN: 16 mg/dL (ref 6–20)
CHLORIDE: 105 mmol/L (ref 101–111)
CO2: 27 mmol/L (ref 22–32)
Calcium: 9.4 mg/dL (ref 8.9–10.3)
Creatinine, Ser: 1.09 mg/dL — ABNORMAL HIGH (ref 0.44–1.00)
GFR calc Af Amer: 53 mL/min — ABNORMAL LOW (ref >60)
GFR, EST NON AFRICAN AMERICAN: 46 mL/min — AB (ref >60)
GLUCOSE: 98 mg/dL (ref 65–99)
POTASSIUM: 4.5 mmol/L (ref 3.5–5.1)
Sodium: 137 mmol/L (ref 135–145)

## 2015-01-30 MED ORDER — ASPIRIN 81 MG PO CHEW
81.0000 mg | CHEWABLE_TABLET | Freq: Every day | ORAL | Status: DC
  Administered 2015-01-30 – 2015-02-04 (×6): 81 mg via ORAL
  Filled 2015-01-30 (×6): qty 1

## 2015-01-30 MED ORDER — HALOPERIDOL LACTATE 5 MG/ML IJ SOLN
2.0000 mg | Freq: Once | INTRAMUSCULAR | Status: AC
  Administered 2015-01-30: 2 mg via INTRAVENOUS
  Filled 2015-01-30: qty 1

## 2015-01-30 MED ORDER — QUETIAPINE FUMARATE 25 MG PO TABS
12.5000 mg | ORAL_TABLET | Freq: Every day | ORAL | Status: DC
  Administered 2015-01-30: 12.5 mg via ORAL
  Filled 2015-01-30: qty 1

## 2015-01-30 MED ORDER — HALOPERIDOL LACTATE 5 MG/ML IJ SOLN
2.0000 mg | Freq: Four times a day (QID) | INTRAMUSCULAR | Status: DC | PRN
  Administered 2015-01-31: 2 mg via INTRAVENOUS
  Filled 2015-01-30: qty 1

## 2015-01-30 NOTE — Progress Notes (Signed)
Chaplain presented to the patient to provide pastoral support and prayer of healing.  The patient was sitting up the chair visiting with her daughter at the time of this visit, and her pastor had also provided pastoral.  Chaplain dialoged with both and encouraged patient to continue to follow the directive of the medical staff as well as her utilizing her faith for support.  She was very pleasant and appreciative of the support. Chaplain Yaakov Guthrie [phone]

## 2015-01-30 NOTE — Progress Notes (Signed)
Pharmacy Antibiotic Follow-up Note  [name] is a 81 y.o. year-old female admitted on 01/28/2015.  The patient is currently on day 3 of Levaquin for PNA that failed to respond to outpatient treatment with azithromycin.  Assessment/Plan: 1. Continue Levaquin 750 mg IV q 48H for now; switch to PO when feasible 2. GNR in UCx obtained on admission will await final c/s and adjust abx if needed  3. Add back ASA 81 mg with hx of afib and mitral valve biological prosthesis 4. Following daily; notes prn   Recent Labs Lab 01/28/15 1320 01/29/15 0533 01/30/15 0510  WBC 7.0 5.6 6.0    Recent Labs Lab 01/28/15 1320 01/29/15 0533 01/30/15 0510  CREATININE 1.35* 1.12* 1.09*   Antimicrobials this admission: 12/12 Azithromycin 12/14 12/14 levofloxacin >>  Levels/dose changes this admission: None to date  Microbiology results: 12/14 UCx: GNR (s/c pending)   Thank you for allowing pharmacy to be a part of this patient's care.  Duayne Cal PharmD 01/30/2015 11:06 AM
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
## 2015-01-30 NOTE — Progress Notes (Signed)
Speech Language Pathology Treatment: Dysphagia  Patient Details Name: [name] MRN: [id number] DOB: [birth year] Today's Date: 01/30/2015 Time: [id number] SLP Time Calculation (min) (ACUTE ONLY): 21 min  Assessment / Plan / Recommendation Clinical Impression  Pt confused, thought she was in church; perseverative about her husband driving and not being able to see. Stated she "was going home" and appeared to be trying to get out of chair. SLP reoriented pt and reassured her she is being taken care of and use her call button, turned up volume on t.v . Delayed coughs appeared to be consistent with po's (primarily liquid with straw). Cups sips decreased signs aspiration. Pt required moderate cues to remain silent immediately following swallow. She would benefit from full supervision with regular texture, thin liquids. Will follow while in hospital and may benefit from home health ST (if pt living at home).   HPI HPI: 81 y.o. female with a PMHx of CHF, HTN, sick sinus syndrome s/p pacemaker, hiatal hernia, Afib, chronic thrombocytopenia, who present to ED with worsening weakness, inability to ambulate. CXR shows LLL PNA. Pt had MBS in 2011 s/p intubation with recommendation for small bites/sips to increase airawy protection with thin liquids.      SLP Plan  Continue with current plan of care     Recommendations  Diet recommendations: Regular;Thin liquid Liquids provided via: Straw;No straw Medication Administration: Whole meds with puree Supervision: Patient able to self feed;Full supervision/cueing for compensatory strategies Compensations: Slow rate;Small sips/bites Postural Changes and/or Swallow Maneuvers: Seated upright 90 degrees              Oral Care Recommendations: Oral care BID Follow up Recommendations: Home health SLP Plan: Continue with current plan of care   [place] Siren 01/30/2015, 9:21 AM  Orbie Pyo Colvin Caroli.Ed [employer] [phone]

## 2015-01-30 NOTE — Telephone Encounter (Signed)
Her last pulmonary doc here was Chase Caller and he still attends at the hospital but I don't - the hospitalisits can certainly call our service there if needed  - let him know  I did review her notes/ cxr's and agree fully with the plan of care

## 2015-01-30 NOTE — Progress Notes (Signed)
Physical Therapy Treatment Patient Details Name: [name] MRN: [id number] DOB: October 08, 1929 Today's Date: 01/30/2015    History of Present Illness Pt adm with PNA. PMHx of CHF, HTN, sick sinus syndrome s/p pacemaker, Afib    PT Comments    Pt confused today and is seeing people in her room that aren't there. Amb slightly improved.  Follow Up Recommendations  SNF     Equipment Recommendations  None recommended by PT    Recommendations for Other Services       Precautions / Restrictions Precautions Precautions: Fall    Mobility  Bed Mobility               General bed mobility comments: Pt up in chair.  Transfers Overall transfer level: Needs assistance Equipment used: Rolling walker (2 wheeled) Transfers: Sit to/from Stand Sit to Stand: Min assist         General transfer comment: Verbal cues for hand placement. Assist to bring hips up. Tremulous.  Ambulation/Gait Ambulation/Gait assistance: Min assist Ambulation Distance (Feet): 15 Feet Assistive device: Rolling walker (2 wheeled) Gait Pattern/deviations: Step-through pattern;Decreased step length - right;Decreased step length - left;Shuffle Gait velocity: decr Gait velocity interpretation: <1.8 ft/sec, indicative of risk for recurrent falls General Gait Details: Assist for balance and to help guide walker around obstacles.    Stairs            Wheelchair Mobility    Modified Rankin (Stroke Patients Only)       Balance   Sitting-balance support: No upper extremity supported;Feet supported Sitting balance-Leahy Scale: Fair     Standing balance support: Bilateral upper extremity supported Standing balance-Leahy Scale: Poor Standing balance comment: support of walker and min A                    Cognition Arousal/Alertness: Awake/alert Behavior During Therapy: Anxious Overall Cognitive Status: Impaired/Different from baseline Area of Impairment: Orientation Orientation  Level: Disoriented to;Place;Situation;Time         Awareness: Intellectual   General Comments: Pt thought she was in church and that she knew me. Also was seeing a little girl in the room.    Exercises      General Comments        Pertinent Vitals/Pain Pain Assessment: No/denies pain    Home Living Family/patient expects to be discharged to:: Unsure Living Arrangements: Spouse/significant other Available Help at Discharge: Family (spouse used a walker)         Home Equipment: Environmental consultant - 2 wheels;Cane - single point      Prior Function Level of Independence: Needs assistance  Gait / Transfers Assistance Needed: Was amb with walker  ADL's / Homemaking Assistance Needed: Mod I with bathing and dressing     PT Goals (current goals can now be found in the care plan section) Acute Rehab PT Goals Patient Stated Goal: return home Progress towards PT goals: Progressing toward goals    Frequency  Min 3X/week    PT Plan Current plan remains appropriate    Co-evaluation             End of Session Equipment Utilized During Treatment: Gait belt Activity Tolerance: Patient limited by fatigue Patient left: with call bell/phone within reach;in chair;with chair alarm set     Time: [id number] PT Time Calculation (min) (ACUTE ONLY): 10 min  Charges:  $Gait Training: 8-22 mins  G Codes:      [name] 01/30/2015, 4:42 PM [facility] PT [phone]

## 2015-01-30 NOTE — NC FL2 (Signed)
[place] LEVEL OF CARE SCREENING TOOL     IDENTIFICATION  Patient Name: [name] Birthdate: [birth year] Sex: female Admission Date (Current Location): 01/28/2015  [facility] and [place] Number: Herbalist and Address:  [facility] [place], [place] [address], [place], [place] [address]      Provider Number: [id number]  Attending Physician Name and Address:  Bonnielee Haff, MD  Relative Name and Phone Number:  Derek Jack [name] [phone]    Current Level of Care: Hospital Recommended Level of Care: [place] Prior Approval Number:    Date Approved/Denied:   PASRR Number:    Discharge Plan: SNF    Current Diagnoses: Patient Active Problem List   Diagnosis Date Noted  . Pneumonia, community acquired 01/28/2015  . PNA (pneumonia) 01/28/2015  . Pacemaker 05/06/2014  . Bilateral leg edema 08/20/2013  . S/P mitral valve replacement with bioprosthetic valve - 29 mm Biocor 04/22/2013  . Permanent atrial fibrillation ([place]) 04/22/2013  . HTN (hypertension) 04/22/2013  . Hyperlipidemia 04/22/2013  . Hypercalcemia 11/28/2012  . Ureteral stone 08/02/2012  . Acute renal failure ([place]) 08/02/2012  . Thrombocytopenia ([place]) 01/20/2011  . Chronic diastolic congestive heart failure ([place]) 04/04/2007  . BRONCHOPNEUMONIA ORGANISM UNSPECIFIED 03/13/2007    Orientation RESPIRATION BLADDER Height & Weight    Self, Time, Situation, Place  Normal Continent   166 lbs.  BEHAVIORAL SYMPTOMS/MOOD NEUROLOGICAL BOWEL NUTRITION STATUS      Continent  (Cardiac)  AMBULATORY STATUS COMMUNICATION OF NEEDS Skin   Extensive Assist Verbally Normal                       Personal Care Assistance Level of Assistance  Bathing, Feeding, Dressing Bathing Assistance: Maximum assistance Feeding assistance: Independent Dressing Assistance: Limited assistance     Functional Limitations Info             SPECIAL CARE FACTORS FREQUENCY   PT (By licensed PT), OT (By licensed OT)     PT Frequency: 5x/week OT Frequency: 2x/week            Contractures      Additional Factors Info  Code Status, Allergies, Psychotropic Code Status Info: Full Allergies Info: NKA Psychotropic Info: Xanax         Current Medications (01/30/2015):  This is the current hospital active medication list Current Facility-Administered Medications  Medication Dose Route Frequency Provider Last Rate Last Dose  . 0.9 %  sodium chloride infusion  250 mL Intravenous PRN Belkys A Regalado, MD      . acetaminophen (TYLENOL) tablet 650 mg  650 mg Oral Q6H PRN Belkys A Regalado, MD       Or  . acetaminophen (TYLENOL) suppository 650 mg  650 mg Rectal Q6H PRN Belkys A Regalado, MD      . albuterol (PROVENTIL) (2.5 MG/3ML) 0.083% nebulizer solution 2.5 mg  2.5 mg Nebulization Q4H PRN Belkys A Regalado, MD      . ALPRAZolam Duanne Moron) tablet 0.5 mg  0.5 mg Oral QHS Belkys A Regalado, MD   0.5 mg at 01/29/15 2149  . aspirin chewable tablet 81 mg  81 mg Oral Daily Bonnielee Haff, MD   81 mg at 01/30/15 1136  . docusate sodium (COLACE) capsule 100 mg  100 mg Oral BID Belkys A Regalado, MD   100 mg at 01/30/15 1034  . guaiFENesin (MUCINEX) 12 hr tablet 600 mg  600 mg Oral BID Bonnielee Haff, MD  600 mg at 01/30/15 1034  . guaiFENesin (ROBITUSSIN) 100 MG/5ML solution 100 mg  5 mL Oral Q4H PRN Bonnielee Haff, MD      . haloperidol lactate (HALDOL) injection 2 mg  2 mg Intravenous Once Bonnielee Haff, MD      . HYDROcodone-acetaminophen (NORCO/VICODIN) 5-325 MG per tablet 1-2 tablet  1-2 tablet Oral Q4H PRN Elmarie Shiley, MD   2 tablet at 01/29/15 0011  . ipratropium-albuterol (DUONEB) 0.5-2.5 (3) MG/3ML nebulizer solution 3 mL  3 mL Nebulization TID Belkys A Regalado, MD   3 mL at 01/30/15 1503  . levofloxacin (LEVAQUIN) IVPB 750 mg  750 mg Intravenous Q48H Meagan A Decker, RPH      . metoprolol tartrate (LOPRESSOR) tablet 12.5 mg  12.5 mg Oral BID Belkys A  Regalado, MD   12.5 mg at 01/30/15 1036  . ondansetron (ZOFRAN) tablet 4 mg  4 mg Oral Q6H PRN Belkys A Regalado, MD       Or  . ondansetron (ZOFRAN) injection 4 mg  4 mg Intravenous Q6H PRN Belkys A Regalado, MD   4 mg at 01/29/15 1403  . pantoprazole (PROTONIX) EC tablet 40 mg  40 mg Oral Daily Belkys A Regalado, MD   40 mg at 01/30/15 1034  . simvastatin (ZOCOR) tablet 10 mg  10 mg Oral QPC supper Belkys A Regalado, MD   10 mg at 01/29/15 1833  . sodium chloride 0.9 % injection 3 mL  3 mL Intravenous Q12H Belkys A Regalado, MD   3 mL at 01/29/15 1000  . sodium chloride 0.9 % injection 3 mL  3 mL Intravenous Q12H Belkys A Regalado, MD   3 mL at 01/30/15 1037  . sodium chloride 0.9 % injection 3 mL  3 mL Intravenous PRN Belkys A Regalado, MD         Discharge Medications: Please see discharge summary for a list of discharge medications.  Relevant Imaging Results:  Relevant Lab Results:   Additional Information SSN: [id number]  Benard Halsted, LCSWA

## 2015-01-30 NOTE — Clinical Documentation Improvement (Signed)
Hospitalist  Can the diagnosis of Diastolic CHF be further specified?    Acuity - Acute, Chronic, Acute on Chronic  Other  Clinically Undetermined  Document any associated diagnoses/conditions   Supporting Information: Chronic diastolic heart failure noted per 12/14 progress notes. Chronic diastolic heart failure exacerbation noted per 12/14 progress notes. 12/14: BNP: 135.9.  Please exercise your independent, professional judgment when responding. A specific answer is not anticipated or expected.   Thank You,  [place] [phone]

## 2015-01-30 NOTE — Evaluation (Signed)
Occupational Therapy Evaluation Patient Details Name: [name] MRN: [id number] DOB: Mar 31, 1929 Today's Date: 01/30/2015    History of Present Illness Pt adm with PNA. PMHx of CHF, HTN, sick sinus syndrome s/p pacemaker, Afib   Clinical Impression   Pt admitted with above diagnosis and presents to OT with functional limitations impacting her ability to complete ADLs due to the deficits listed below (see OT Problem List).  Pt very tremulous in sitting and standing, fearful with mobility.  Pt will benefit from skilled OT acutely to maximize independence and safety with ADLs to allow discharge to venue listed below.    Follow Up Recommendations  SNF;Supervision/Assistance - 24 hour    Equipment Recommendations  Tub/shower seat;3 in 1 bedside comode    Recommendations for Other Services       Precautions / Restrictions Precautions Precautions: Fall      Mobility  Transfers Overall transfer level: Needs assistance Equipment used: Rolling walker (2 wheeled) Transfers: Sit to/from Stand Sit to Stand: Mod assist         General transfer comment: Pt very tremulous and assist to bring hips up and control balance in standing.         ADL Overall ADL's : Needs assistance/impaired             Lower Body Bathing: Moderate assistance;Sit to/from stand Lower Body Bathing Details (indicate cue type and reason): requires assist for boosting with sit > stand and steadying assist in standing     Lower Body Dressing: Sit to/from stand;Maximal assistance Lower Body Dressing Details (indicate cue type and reason): requires assist for boosting with sit > stand and steadying assist in standing     Toileting- Clothing Manipulation and Hygiene: Moderate assistance;Sit to/from stand         General ADL Comments: Pt very tremulous in sitting and standing, requires mod assist for sit > stand and standing balance.  Too fearful to complete transfers this session.      Vision Vision Assessment?: No apparent visual deficits          Pertinent Vitals/Pain Pain Assessment: No/denies pain     Hand Dominance Right   Extremity/Trunk Assessment Upper Extremity Assessment Upper Extremity Assessment: Generalized weakness   Lower Extremity Assessment Lower Extremity Assessment: Generalized weakness       Communication Communication Communication: No difficulties   Cognition Arousal/Alertness: Awake/alert Behavior During Therapy: Anxious Overall Cognitive Status: No family/caregiver present to determine baseline cognitive functioning Area of Impairment: Awareness           Awareness: Intellectual   General Comments: pt thought she was in a church or school, she also would speak to her husband who was not present              Home Living Family/patient expects to be discharged to:: Unsure Living Arrangements: Spouse/significant other Available Help at Discharge: Family (spouse used a walker)                         Home Equipment: Environmental consultant - 2 wheels;Cane - single point          Prior Functioning/Environment Level of Independence: Needs assistance  Gait / Transfers Assistance Needed: Was amb with walker  ADL's / Homemaking Assistance Needed: Mod I with bathing and dressing        OT Diagnosis: Generalized weakness;Altered mental status   OT Problem List: Decreased strength;Decreased activity tolerance;Impaired balance (sitting and/or standing);Decreased cognition;Decreased knowledge of use of  DME or AE   OT Treatment/Interventions: Self-care/ADL training;DME and/or AE instruction;Therapeutic activities;Cognitive remediation/compensation;Patient/family education;Balance training    OT Goals(Current goals can be found in the care plan section) Acute Rehab OT Goals Patient Stated Goal: return home OT Goal Formulation: With patient Time For Goal Achievement: 02/13/15 Potential to Achieve Goals: Good  OT  Frequency: Min 2X/week   Barriers to D/C: Decreased caregiver support  pt's husband uses walker for mobility          End of Session Equipment Utilized During Treatment: Rolling walker Nurse Communication: Mobility status  Activity Tolerance: Patient tolerated treatment well Patient left: in chair;with call bell/phone within reach;with chair alarm set   Time: 1424-1436 OT Time Calculation (min): 12 min Charges:  OT General Charges $OT Visit: 1 Procedure OT Evaluation $Initial OT Evaluation Tier I: 1 Procedure G-CodesSimonne Come, [id number] 01/30/2015, 2:44 PM

## 2015-01-30 NOTE — Clinical Documentation Improvement (Signed)
Hospitalist  Abnormal Lab/Test Results:   12/14:  Abnormal urine micro. 12/14:  Abnormal urinalysis.  Possible Clinical Conditions associated with below indicators  Urinary Tract Infection  Other Condition  Cannot Clinically Determine   Please exercise your independent, professional judgment when responding. A specific answer is not anticipated or expected.   Thank You,  [place] [phone]

## 2015-01-30 NOTE — Telephone Encounter (Signed)
Called spoke with pt spouse and is aware of below. Nothing further needed

## 2015-01-30 NOTE — Care Management Note (Signed)
Case Management Note  Patient Details  Name: [name] MRN: [id number] Date of Birth: 07-21-1929  Subjective/Objective:   Per pt eval rec SNF for patient, patient is agreeable per CSW note, CSW following.                 Action/Plan:   Expected Discharge Date:                  Expected Discharge Plan:  Skilled Nursing Facility  In-House Referral:  Clinical Social Work  Discharge planning Services  CM Consult  Post Acute Care Choice:    Choice offered to:     DME Arranged:    DME Agency:     HH Arranged:    [place] Agency:     Status of Service:  Completed, signed off  Medicare Important Message Given:    Date Medicare IM Given:    Medicare IM give by:    Date Additional Medicare IM Given:    Additional Medicare Important Message give by:     If discussed at [place] of Stay Meetings, dates discussed:    Additional Comments:  Zenon Mayo, RN 01/30/2015, 5:52 PM

## 2015-01-30 NOTE — Clinical Social Work Note (Signed)
Clinical Social Work Assessment  Patient Details  Name: [name] MRN: [id number] Date of Birth: 04-10-1929  Date of referral:  01/30/15               Reason for consult:  Facility Placement                Permission sought to share information with:  Facility Sport and exercise psychologist, Family Supports Permission granted to share information::  Yes, Verbal Permission Granted  Name::     [name]  Agency::  [facility] SNFs  Relationship::  Daughter  Contact Information:  [phone]  Housing/Transportation Living arrangements for the past 2 months:  [place] of Information:  Patient Patient Interpreter Needed:  None Criminal Activity/Legal Involvement Pertinent to Current Situation/Hospitalization:  No - Comment as needed Significant Relationships:  Adult Children, Spouse Lives with:  Spouse Do you feel safe going back to the place where you live?  No Need for family participation in patient care:  Yes (Comment)  Care giving concerns:  CSW received referral for possible SNF placement at time of discharge. CSW met with patient regarding PT recommendation of SNF placement at time of discharge. Patient reports being unable to currently unable to care for patient at their home given patient's current physical needs and fall risk. Patient expressed understanding of PT recommendation and is agreeable to SNF placement at time of discharge. CSW to continue to follow and assist with discharge planning needs.   Social Worker assessment / plan:  CSW spoke with patient concerning possibility of rehab at [facility] before returning home.  Employment status:  Retired Nurse, adult PT Recommendations:  [place] / Referral to community resources:  [place]  Patient/Family's Response to care:  Patient recognizes need for rehab before returning home and are agreeable to a SNF in [place].    Patient/Family's Understanding of and Emotional Response to Diagnosis, Current Treatment, and Prognosis:  Patient is realistic regarding therapy needs. No questions/concerns about plan or treatment.    Emotional Assessment Appearance:  Appears stated age Attitude/Demeanor/Rapport:   (Pleasant) Affect (typically observed):  Accepting, Appropriate Orientation:  Oriented to Self, Oriented to Place, Oriented to  Time, Oriented to Situation Alcohol / Substance use:  Not Applicable Psych involvement (Current and /or in the community):  No (Comment)  Discharge Needs  Concerns to be addressed:  Care Coordination Readmission within the last 30 days:  No Current discharge risk:  None Barriers to Discharge:  Continued Medical Work up   [employer], [place] 01/30/2015, 4:57 PM

## 2015-01-31 ENCOUNTER — Inpatient Hospital Stay (HOSPITAL_COMMUNITY): Payer: Medicare Other

## 2015-01-31 DIAGNOSIS — B9620 Unspecified Escherichia coli [E. coli] as the cause of diseases classified elsewhere: Secondary | ICD-10-CM

## 2015-01-31 DIAGNOSIS — G9340 Encephalopathy, unspecified: Secondary | ICD-10-CM | POA: Diagnosis present

## 2015-01-31 LAB — URINE CULTURE
Culture: >=100000
Culture: >=100000

## 2015-01-31 MED ORDER — GUAIFENESIN ER 600 MG PO TB12
1200.0000 mg | ORAL_TABLET | Freq: Two times a day (BID) | ORAL | Status: DC
  Administered 2015-01-31 – 2015-02-04 (×8): 1200 mg via ORAL
  Filled 2015-01-31 (×9): qty 2

## 2015-01-31 MED ORDER — CEFPODOXIME PROXETIL 200 MG PO TABS
200.0000 mg | ORAL_TABLET | Freq: Two times a day (BID) | ORAL | Status: DC
  Administered 2015-01-31: 200 mg via ORAL
  Filled 2015-01-31 (×2): qty 1

## 2015-01-31 MED ORDER — LEVALBUTEROL HCL 0.63 MG/3ML IN NEBU: 0.6300 mg | INHALATION_SOLUTION | RESPIRATORY_TRACT | Status: DC | PRN

## 2015-01-31 MED ORDER — SODIUM CHLORIDE 0.45 % IV SOLN
INTRAVENOUS | Status: AC
  Administered 2015-01-31: 11:00:00 via INTRAVENOUS

## 2015-01-31 MED ORDER — PIPERACILLIN-TAZOBACTAM 3.375 G IVPB
3.3750 g | Freq: Three times a day (TID) | INTRAVENOUS | Status: DC
  Administered 2015-01-31 – 2015-02-03 (×8): 3.375 g via INTRAVENOUS
  Filled 2015-01-31 (×10): qty 50

## 2015-01-31 MED ORDER — HALOPERIDOL LACTATE 5 MG/ML IJ SOLN: 1.0000 mg | Freq: Four times a day (QID) | INTRAMUSCULAR | Status: DC | PRN

## 2015-01-31 MED ORDER — SACCHAROMYCES BOULARDII 250 MG PO CAPS
250.0000 mg | ORAL_CAPSULE | Freq: Two times a day (BID) | ORAL | Status: DC
  Administered 2015-01-31 – 2015-02-04 (×8): 250 mg via ORAL
  Filled 2015-01-31 (×8): qty 1

## 2015-01-31 MED ORDER — VANCOMYCIN HCL 10 G IV SOLR
1500.0000 mg | INTRAVENOUS | Status: AC
  Administered 2015-01-31: 1500 mg via INTRAVENOUS
  Filled 2015-01-31: qty 1500

## 2015-01-31 MED ORDER — LEVALBUTEROL HCL 0.63 MG/3ML IN NEBU
0.6300 mg | INHALATION_SOLUTION | Freq: Four times a day (QID) | RESPIRATORY_TRACT | Status: DC
  Administered 2015-01-31 – 2015-02-01 (×5): 0.63 mg via RESPIRATORY_TRACT
  Filled 2015-01-31 (×5): qty 3

## 2015-01-31 MED ORDER — WHITE PETROLATUM GEL
Status: AC
Start: 2015-01-31 — End: 2015-01-31
  Administered 2015-01-31: 0.2
  Filled 2015-01-31: qty 1

## 2015-01-31 MED ORDER — VANCOMYCIN HCL IN DEXTROSE 1-5 GM/200ML-% IV SOLN
1000.0000 mg | INTRAVENOUS | Status: DC
  Administered 2015-02-01 – 2015-02-02 (×2): 1000 mg via INTRAVENOUS
  Filled 2015-01-31 (×3): qty 200

## 2015-01-31 MED ORDER — IPRATROPIUM BROMIDE 0.02 % IN SOLN
0.5000 mg | Freq: Four times a day (QID) | RESPIRATORY_TRACT | Status: DC
  Administered 2015-01-31 – 2015-02-01 (×5): 0.5 mg via RESPIRATORY_TRACT
  Filled 2015-01-31 (×5): qty 2.5

## 2015-01-31 NOTE — Progress Notes (Signed)
Pharmacy Antibiotic Follow-up Note  [name] is a 81 y.o. year-old female admitted on 01/28/2015.  The patient was on previous treatment for PNA and UTI. Pharmacy consulted to begin vancomycin and Zosyn for PNA.   Assessment/Plan: CXR with persistent left base opacity that may represent PNA. Broadening coverage to vancomycin and Zosyn. She is afebrile, WBC are normal, and renal function is stable.  Vancomycin 1500 mg IV now then 1000 mg IV q24h Zosyn 3.375 g IV q8h to be infused over 4 hours Monitor renal function, clinical progress, and culture data   Recent Labs Lab 01/28/15 1320 01/29/15 0533 01/30/15 0510  WBC 7.0 5.6 6.0     Recent Labs Lab 01/28/15 1320 01/29/15 0533 01/30/15 0510  CREATININE 1.35* 1.12* 1.09*   Antimicrobials this admission: azithromycin 12/12 >> 12/14 levofloxacin 12/14 >>12/16 Cefpodoxime 12/17 x1 dose Vanc 12/17>> Zosyn 12/17>>  Levels/dose changes this admission: None to date  Microbiology results: 12/14 UCx: E coli (pan S except Cipro) 12/15 UCX: same as above  Thank you for allowing pharmacy to be a part of this patient's care.  [place], Pharm.D., BCPS Clinical Pharmacist Pager: [phone] 01/31/2015 6:36 PM

## 2015-01-31 NOTE — Progress Notes (Addendum)
[facility] PROGRESS NOTE  [name] [id number] DOB: [birth year] DOA: 01/28/2015  PCP:  Melinda Crutch, MD  Brief HPI: 81 year old Caucasian female with a past medical history of congestive heart failure, hypertension, sick sinus syndrome, atrial fibrillation, presented with weakness, cough. She was diagnosed with pneumonia. She failed outpatient treatment and was subsequently hospitalized. She was also noted to have a urinary tract infection.  Past medical history:  Past Medical History  Diagnosis Date  . CHF (congestive heart failure) ([place])   . Edema of both legs 04-23-2013  pt states feet swelling (wears ted hose)    bilateral venous ablation procedures done in IR, L>R  . Hypertension   . Venous insufficiency, peripheral   . H/O hiatal hernia   . Anxiety   . Depression   . Permanent atrial fibrillation ([place])     CARDIOLOGIST-- DR [id number]  . Sick sinus syndrome ([place])     S/P  PACEMAKER  2009  . S/P mitral valve replacement with bioprosthetic valve     04-03-2009  . Right ureteral stone   . Thrombocytopenia, immune     CHRONIC---  HEMOTOLOGIST---  DR ENNEVER  . Osteopenia   . Benign essential tremor   . Schatzki's ring   . Hyperparathyroidism ([place])     MILD--  STABLE  . Hyperlipidemia   . GERD (gastroesophageal reflux disease)   . Basal cell carcinoma of forehead   . Wears glasses   . Kidney stones   . Presence of permanent cardiac pacemaker     LAST PACER CHECK  04-23-2013  IN EPIC  . CAP (community acquired pneumonia) 01/29/2015  . Pneumonia "several times"  . Arthritis     "mostly my hands" (01/29/2015)    Consultants: Cardiology  Procedures: none  Antibiotics: Levaquin  Subjective: Patient lethargic this morning. Remains distracted and confused. Denies any nausea, vomiting. Occasional pain in the left side of her chest, especially with coughing or deep breathing.   Objective: Vital Signs  Filed Vitals:   01/30/15 2040 01/30/15 2143  01/30/15 2337 01/31/15 0516  BP:  127/107 95/55 131/45  Pulse:  148 92 104  Temp:  98.2 F (36.8 C)  98.7 F (37.1 C)  TempSrc:  Oral  Axillary  Resp:  20 18 20   Height:      Weight:      SpO2: 96% 93% 92% 90%    Intake/Output Summary (Last 24 hours) at 01/31/15 1012 Last data filed at 01/31/15 0917  Gross per 24 hour  Intake  12270 ml  Output      0 ml  Net  12270 ml   Filed Weights   01/28/15 1825  Weight: 75.66 kg (166 lb 12.8 oz)    General appearance: Somewhat lethargic this morning. Easily arousable. Resp: Continues to have crackles in the left side. No rhonchi. Scattered wheezes. Similar area and 3 compared to yesterday.   Cardio: S1, S2 is irregularly irregular. No S3, S4. No rubs, murmurs, or bruit. GI: soft, non-tender; bowel sounds normal; no masses,  no organomegaly Extremities: extremities normal, atraumatic, no cyanosis or edema Neurologic: Distracted. Unable to fully answer my questions today. No facial assymmetry. Moving all her extremities.  Lab Results:  Basic Metabolic Panel:  Recent Labs Lab 01/28/15 1320 01/29/15 0533 01/30/15 0510  NA 142 141 137  K 5.1 4.2 4.5  CL 106 108 105  CO2 29 28 27   GLUCOSE 126* 87 98  BUN 21* 19 16  CREATININE 1.35* 1.12* 1.09*  CALCIUM 10.5* 9.4 9.4   Liver Function Tests:  Recent Labs Lab 01/28/15 1320  AST 43*  ALT 20  ALKPHOS 43  BILITOT 0.6  PROT 6.2*  ALBUMIN 3.4*   CBC:  Recent Labs Lab 01/28/15 1320 01/29/15 0533 01/30/15 0510  WBC 7.0 5.6 6.0  NEUTROABS 4.9  --   --   HGB 13.1 10.8* 11.5*  HCT 41.7 35.1* 36.3  MCV 98.3 97.2 97.3  PLT 81* 74* 78*   BNP (last 3 results)  Recent Labs  01/28/15 1916  BNP 135.9*     Recent Results (from the past 240 hour(s))  Urine culture     Status: None   Collection Time: 01/28/15  2:09 PM  Result Value Ref Range Status   Specimen Description URINE, RANDOM  Final   Special Requests NONE  Final   Culture >=100,000 COLONIES/mL ESCHERICHIA  COLI  Final   Report Status 01/31/2015 FINAL  Final   Organism ID, Bacteria ESCHERICHIA COLI  Final      Susceptibility   Escherichia coli - MIC*    AMPICILLIN <=2 SENSITIVE Sensitive     CEFAZOLIN <=4 SENSITIVE Sensitive     CEFTRIAXONE <=1 SENSITIVE Sensitive     CIPROFLOXACIN >=4 RESISTANT Resistant     GENTAMICIN <=1 SENSITIVE Sensitive     IMIPENEM <=0.25 SENSITIVE Sensitive     NITROFURANTOIN <=16 SENSITIVE Sensitive     TRIMETH/SULFA <=20 SENSITIVE Sensitive     AMPICILLIN/SULBACTAM <=2 SENSITIVE Sensitive     PIP/TAZO <=4 SENSITIVE Sensitive     * >=100,000 COLONIES/mL ESCHERICHIA COLI  Culture, expectorated sputum-assessment     Status: None   Collection Time: 01/29/15 12:47 PM  Result Value Ref Range Status   Specimen Description SPUTUM  Final   Special Requests NONE  Final   Sputum evaluation   Final    MICROSCOPIC FINDINGS SUGGEST THAT THIS SPECIMEN IS NOT REPRESENTATIVE OF LOWER RESPIRATORY SECRETIONS. PLEASE RECOLLECT. Gram Stain Report Called to,Read Back By and Verified With: AMilana Obey AT [id number] ON [id number] BY Rhea Bleacher    Report Status 01/29/2015 FINAL  Final  Urine culture     Status: None   Collection Time: 01/29/15  3:03 PM  Result Value Ref Range Status   Specimen Description URINE, RANDOM  Final   Special Requests NONE  Final   Culture >=100,000 COLONIES/mL ESCHERICHIA COLI  Final   Report Status 01/31/2015 FINAL  Final   Organism ID, Bacteria ESCHERICHIA COLI  Final      Susceptibility   Escherichia coli - MIC*    AMPICILLIN <=2 SENSITIVE Sensitive     CEFAZOLIN <=4 SENSITIVE Sensitive     CEFTRIAXONE <=1 SENSITIVE Sensitive     CIPROFLOXACIN >=4 RESISTANT Resistant     GENTAMICIN <=1 SENSITIVE Sensitive     IMIPENEM <=0.25 SENSITIVE Sensitive     NITROFURANTOIN <=16 SENSITIVE Sensitive     TRIMETH/SULFA <=20 SENSITIVE Sensitive     AMPICILLIN/SULBACTAM <=2 SENSITIVE Sensitive     PIP/TAZO <=4 SENSITIVE Sensitive     * >=100,000 COLONIES/mL  ESCHERICHIA COLI      Studies/Results: No results found.  Medications:  Scheduled: . ALPRAZolam  0.5 mg Oral QHS  . aspirin  81 mg Oral Daily  . cefpodoxime  200 mg Oral Q12H  . docusate sodium  100 mg Oral BID  . guaiFENesin  600 mg Oral BID  . ipratropium-albuterol  3 mL Nebulization TID  . metoprolol tartrate  12.5 mg Oral BID  . pantoprazole  40 mg Oral Daily  . QUEtiapine  12.5 mg Oral QHS  . simvastatin  10 mg Oral QPC supper  . sodium chloride  3 mL Intravenous Q12H  . sodium chloride  3 mL Intravenous Q12H   Continuous: . sodium chloride     [id number] chloride, acetaminophen **OR** acetaminophen, albuterol, guaiFENesin, haloperidol lactate, HYDROcodone-acetaminophen, ondansetron **OR** ondansetron (ZOFRAN) IV, sodium chloride  Assessment/Plan:  Active Problems:   Chronic diastolic congestive heart failure (HCC)   Thrombocytopenia (HCC)   Acute renal failure (HCC)   S/P mitral valve replacement with bioprosthetic valve - 29 mm Biocor   Permanent atrial fibrillation (HCC)   Pneumonia, community acquired   PNA (pneumonia)    Community-acquired pneumonia Patient failed outpatient treatment. She was started on IV Levaquin. She seems to be improving slowly. Culture data unremarkable so far. See urine culture report as discussed below. Antibiotics changed to Vantin. Oxygen as needed.  Urinary tract infection with Escherichia coli Sensitivities noted. Resistant to ciprofloxacin. Change antibiotics to Vantin.  Acute encephalopathy Patient continues to be encephalopathic. She was noted to be more agitated yesterday. She was given Haldol and then Seroquel. This morning she is lethargic. She is easily arousable. Moving all her extremities. Levaquin could also be contributing. We'll change her antibiotics. She also appears to be slightly dehydrated. She will be given IV fluids. She has had 2 CT scans of her head, both of which did not reveal any acute findings. Cerebral  atrophy was noted, implying that she may have underlying cognitive impairment.   Chronic diastolic congestive heart failure Reasonably well compensated for now. Does not appear to be in acute exacerbation. Continue to monitor closely.  History of essential hypertension Monitor blood pressures closely. Holding amlodipine.  History of atrial fibrillation Heart rate is reasonably well controlled. Continue with metoprolol. She is followed by cardiology and is thought to be a poor candidate for anticoagulation due to frequent falls and high risk for injury, advanced age and frailty.  History of mitral valve replacement with bioprosthesis Stable. Continue aspirin  History of pacemaker Stable  History of essential tremors Stable  Thrombocytopenia Patient has a history of chronic ITP. Counts are stable. Continue to monitor.  ADDENDUM Patient coughing more. Lungs reveal bilateral crackles. Air entry same as before. Repeat CXR shows worsening infiltrate on left. Will broaden abx. Obtain blood cultures..  DVT Prophylaxis: SCDs Code Status: Full code  Family Communication: Discussed with the patient and her daughter Disposition Plan: Remains encephalopathic. Changed antibiotics as discussed above. Reorient daily. PT recommends SNF. Patient and family agreeable. Not quite ready for discharge.    LOS: 3 days   [facility] [place] [facility] Pager [phone] 01/31/2015, 10:12 AM  If 7PM-7AM, please contact night-coverage at [email or web address], password [facility]

## 2015-02-01 LAB — CBC
HCT: 33.2 % — ABNORMAL LOW (ref 36.0–46.0)
Hemoglobin: 10.4 g/dL — ABNORMAL LOW (ref 12.0–15.0)
MCH: 30.1 pg (ref 26.0–34.0)
MCHC: 31.3 g/dL (ref 30.0–36.0)
MCV: 96 fL (ref 78.0–100.0)
PLATELETS: 84 10*3/uL — AB (ref 150–400)
RBC: 3.46 MIL/uL — ABNORMAL LOW (ref 3.87–5.11)
RDW: 14.9 % (ref 11.5–15.5)
WBC: 7.8 10*3/uL (ref 4.0–10.5)

## 2015-02-01 LAB — BASIC METABOLIC PANEL
Anion gap: 7 (ref 5–15)
BUN: 16 mg/dL (ref 6–20)
CALCIUM: 9.5 mg/dL (ref 8.9–10.3)
CHLORIDE: 106 mmol/L (ref 101–111)
CO2: 25 mmol/L (ref 22–32)
CREATININE: 0.96 mg/dL (ref 0.44–1.00)
GFR calc Af Amer: >60 mL/min (ref >60)
GFR, EST NON AFRICAN AMERICAN: 53 mL/min — AB (ref >60)
Glucose, Bld: 85 mg/dL (ref 65–99)
Potassium: 4.7 mmol/L (ref 3.5–5.1)
SODIUM: 138 mmol/L (ref 135–145)

## 2015-02-01 MED ORDER — LEVALBUTEROL HCL 0.63 MG/3ML IN NEBU
0.6300 mg | INHALATION_SOLUTION | Freq: Three times a day (TID) | RESPIRATORY_TRACT | Status: DC
  Administered 2015-02-02 – 2015-02-04 (×6): 0.63 mg via RESPIRATORY_TRACT
  Filled 2015-02-01 (×8): qty 3

## 2015-02-01 MED ORDER — IPRATROPIUM BROMIDE 0.02 % IN SOLN
0.5000 mg | Freq: Three times a day (TID) | RESPIRATORY_TRACT | Status: DC
  Administered 2015-02-02 – 2015-02-04 (×6): 0.5 mg via RESPIRATORY_TRACT
  Filled 2015-02-01 (×8): qty 2.5

## 2015-02-01 NOTE — Progress Notes (Signed)
[facility] PROGRESS NOTE  [name] [id number] DOB: Jun 30, 1929 DOA: 01/28/2015  PCP:  Melinda Crutch, MD  Brief HPI: 81 year old Caucasian female with a past medical history of congestive heart failure, hypertension, sick sinus syndrome, atrial fibrillation, presented with weakness, cough. She was diagnosed with pneumonia. She failed outpatient treatment and was subsequently hospitalized. She was also noted to have a urinary tract infection.  Past medical history:  Past Medical History  Diagnosis Date  . CHF (congestive heart failure) ([place])   . Edema of both legs 04-23-2013  pt states feet swelling (wears ted hose)    bilateral venous ablation procedures done in IR, L>R  . Hypertension   . Venous insufficiency, peripheral   . H/O hiatal hernia   . Anxiety   . Depression   . Permanent atrial fibrillation ([place])     CARDIOLOGIST-- DR [id number]  . Sick sinus syndrome ([place])     S/P  PACEMAKER  2009  . S/P mitral valve replacement with bioprosthetic valve     04-03-2009  . Right ureteral stone   . Thrombocytopenia, immune     CHRONIC---  HEMOTOLOGIST---  DR ENNEVER  . Osteopenia   . Benign essential tremor   . Schatzki's ring   . Hyperparathyroidism ([place])     MILD--  STABLE  . Hyperlipidemia   . GERD (gastroesophageal reflux disease)   . Basal cell carcinoma of forehead   . Wears glasses   . Kidney stones   . Presence of permanent cardiac pacemaker     LAST PACER CHECK  04-23-2013  IN [facility]  . CAP (community acquired pneumonia) 01/29/2015  . Pneumonia "several times"  . Arthritis     "mostly my hands" (01/29/2015)    Consultants: Cardiology  Procedures: none  Antibiotics: Levaquin was changed to vancomycin and Zosyn 12/17  Subjective: Patient feels better this morning. Less short of breath. Seems to be less distracted and confused. No nausea, vomiting. She tells me that she ate some breakfast this morning.   Objective: Vital Signs  Filed Vitals:   01/31/15 2206 02/01/15 0119 02/01/15 0621 02/01/15 0938  BP: 136/70  130/61   Pulse: 102  112   Temp: 97.9 F (36.6 C)  97.6 F (36.4 C)   TempSrc: Oral  Oral   Resp:   16   Height:      Weight:      SpO2: 97% 96% 98% 98%    Intake/Output Summary (Last 24 hours) at 02/01/15 1025 Last data filed at 02/01/15 0912  Gross per 24 hour  Intake   1115 ml  Output      0 ml  Net   1115 ml   Filed Weights   01/28/15 1825  Weight: 75.66 kg (166 lb 12.8 oz)    General appearance: Somewhat lethargic this morning. Easily arousable. Resp: Improved air entry today. Few crackles on the left side. No rhonchi. No wheezing.  Cardio: S1, S2 is irregularly irregular. No S3, S4. No rubs, murmurs, or bruit. GI: soft, non-tender; bowel sounds normal; no masses,  no organomegaly Extremities: extremities normal, atraumatic, no cyanosis or edema Neurologic: Seems less distracted. More awake and alert. No facial assymmetry. Moving all her extremities.  Lab Results:  Basic Metabolic Panel:  Recent Labs Lab 01/28/15 1320 01/29/15 0533 01/30/15 0510 02/01/15 0545  NA 142 141 137 138  K 5.1 4.2 4.5 4.7  CL 106 108 105 106  CO2 29 28 27 25   GLUCOSE 126* 87 98 85  BUN 21* 19 16 16   CREATININE 1.35* 1.12* 1.09* 0.96  CALCIUM 10.5* 9.4 9.4 9.5   Liver Function Tests:  Recent Labs Lab 01/28/15 1320  AST 43*  ALT 20  ALKPHOS 43  BILITOT 0.6  PROT 6.2*  ALBUMIN 3.4*   CBC:  Recent Labs Lab 01/28/15 1320 01/29/15 0533 01/30/15 0510 02/01/15 0545  WBC 7.0 5.6 6.0 7.8  NEUTROABS 4.9  --   --   --   HGB 13.1 10.8* 11.5* 10.4*  HCT 41.7 35.1* 36.3 33.2*  MCV 98.3 97.2 97.3 96.0  PLT 81* 74* 78* 84*   BNP (last 3 results)  Recent Labs  01/28/15 1916  BNP 135.9*     Recent Results (from the past 240 hour(s))  Urine culture     Status: None   Collection Time: 01/28/15  2:09 PM  Result Value Ref Range Status   Specimen Description URINE, RANDOM  Final   Special Requests  NONE  Final   Culture >=100,000 COLONIES/mL ESCHERICHIA COLI  Final   Report Status 01/31/2015 FINAL  Final   Organism ID, Bacteria ESCHERICHIA COLI  Final      Susceptibility   Escherichia coli - MIC*    AMPICILLIN <=2 SENSITIVE Sensitive     CEFAZOLIN <=4 SENSITIVE Sensitive     CEFTRIAXONE <=1 SENSITIVE Sensitive     CIPROFLOXACIN >=4 RESISTANT Resistant     GENTAMICIN <=1 SENSITIVE Sensitive     IMIPENEM <=0.25 SENSITIVE Sensitive     NITROFURANTOIN <=16 SENSITIVE Sensitive     TRIMETH/SULFA <=20 SENSITIVE Sensitive     AMPICILLIN/SULBACTAM <=2 SENSITIVE Sensitive     PIP/TAZO <=4 SENSITIVE Sensitive     * >=100,000 COLONIES/mL ESCHERICHIA COLI  Culture, expectorated sputum-assessment     Status: None   Collection Time: 01/29/15 12:47 PM  Result Value Ref Range Status   Specimen Description SPUTUM  Final   Special Requests NONE  Final   Sputum evaluation   Final    MICROSCOPIC FINDINGS SUGGEST THAT THIS SPECIMEN IS NOT REPRESENTATIVE OF LOWER RESPIRATORY SECRETIONS. PLEASE RECOLLECT. Gram Stain Report Called to,Read Back By and Verified With: AMilana Obey AT [id number] ON [id number] BY Rhea Bleacher    Report Status 01/29/2015 FINAL  Final  Urine culture     Status: None   Collection Time: 01/29/15  3:03 PM  Result Value Ref Range Status   Specimen Description URINE, RANDOM  Final   Special Requests NONE  Final   Culture >=100,000 COLONIES/mL ESCHERICHIA COLI  Final   Report Status 01/31/2015 FINAL  Final   Organism ID, Bacteria ESCHERICHIA COLI  Final      Susceptibility   Escherichia coli - MIC*    AMPICILLIN <=2 SENSITIVE Sensitive     CEFAZOLIN <=4 SENSITIVE Sensitive     CEFTRIAXONE <=1 SENSITIVE Sensitive     CIPROFLOXACIN >=4 RESISTANT Resistant     GENTAMICIN <=1 SENSITIVE Sensitive     IMIPENEM <=0.25 SENSITIVE Sensitive     NITROFURANTOIN <=16 SENSITIVE Sensitive     TRIMETH/SULFA <=20 SENSITIVE Sensitive     AMPICILLIN/SULBACTAM <=2 SENSITIVE Sensitive     PIP/TAZO  <=4 SENSITIVE Sensitive     * >=100,000 COLONIES/mL ESCHERICHIA COLI  Culture, blood (Routine X 2) w Reflex to ID Panel     Status: None (Preliminary result)   Collection Time: 01/31/15  7:28 PM  Result Value Ref Range Status   Specimen Description LEFT ANTECUBITAL  Final   Special Requests BOTTLES DRAWN AEROBIC AND ANAEROBIC 6CC  Final   Culture NO GROWTH < 12 HOURS  Final   Report Status PENDING  Incomplete  Culture, blood (Routine X 2) w Reflex to ID Panel     Status: None (Preliminary result)   Collection Time: 01/31/15  7:33 PM  Result Value Ref Range Status   Specimen Description RIGHT ANTECUBITAL  Final   Special Requests BOTTLES DRAWN AEROBIC AND ANAEROBIC 5CC  Final   Culture NO GROWTH < 12 HOURS  Final   Report Status PENDING  Incomplete      Studies/Results: Dg Chest Port 1 View  01/31/2015  CLINICAL DATA:  Dyspnea. EXAM: PORTABLE CHEST 1 VIEW COMPARISON:  01/28/2015 FINDINGS: There is a left chest wall pacer device with lead in the right atrial appendage and right ventricle. Moderate cardiac enlargement. Aortic atherosclerosis noted. There is mild diffuse edema. Increased from previous exam Airspace consolidation noted within the left lower lobe. IMPRESSION: 1. Cardiac enlargement and edema consistent with CHF. 2. Persistent left base opacity which may represent pneumonia. Electronically Signed   By: Kerby Moors M.D.   On: 01/31/2015 18:22    Medications:  Scheduled: . ALPRAZolam  0.5 mg Oral QHS  . aspirin  81 mg Oral Daily  . docusate sodium  100 mg Oral BID  . guaiFENesin  1,200 mg Oral BID  . ipratropium  0.5 mg Nebulization Q6H  . levalbuterol  0.63 mg Nebulization Q6H  . metoprolol tartrate  12.5 mg Oral BID  . pantoprazole  40 mg Oral Daily  . piperacillin-tazobactam (ZOSYN)  IV  3.375 g Intravenous Q8H  . saccharomyces boulardii  250 mg Oral BID  . simvastatin  10 mg Oral QPC supper  . sodium chloride  3 mL Intravenous Q12H  . sodium chloride  3 mL  Intravenous Q12H  . vancomycin  1,000 mg Intravenous Q24H   Continuous:   [id number] chloride, acetaminophen **OR** acetaminophen, guaiFENesin, haloperidol lactate, HYDROcodone-acetaminophen, levalbuterol, ondansetron **OR** ondansetron (ZOFRAN) IV, sodium chloride  Assessment/Plan:  Active Problems:   Chronic diastolic congestive heart failure (HCC)   Thrombocytopenia (HCC)   Acute renal failure (HCC)   S/P mitral valve replacement with bioprosthetic valve - 29 mm Biocor   Permanent atrial fibrillation (HCC)   Pneumonia, community acquired   PNA (pneumonia)   Acute encephalopathy    Community-acquired pneumonia Patient failed outpatient treatment. She was started on IV Levaquin. Initially it did appear that she was improving. However, her cough got worse. Chest x-ray was repeated which revealed increasing infiltrate on the left. This prompted a change in antibiotics. She was started on vancomycin and Zosyn. She seems better this morning. Continue current treatment for now. Follow-up blood cultures.   Urinary tract infection with Escherichia coli Sensitivities noted. Current antibiotics should cover.  Acute encephalopathy Mental status seems to be slightly better this morning. Seroquel could have caused her to be more confused. This has been discontinued. Plus she was also on Levaquin which can also cause confusion in elderly. This has also been discontinued. Continue to monitor closely. She has had 2 CT scans of her head, both of which did not reveal any acute findings. Cerebral atrophy was noted, implying that she may have underlying cognitive impairment.   Chronic diastolic congestive heart failure Reasonably well compensated for now. Does not appear to be in acute exacerbation. Continue to monitor closely.  History of essential hypertension Monitor blood pressures closely. Holding amlodipine.  History of atrial fibrillation Heart rate is reasonably well controlled. Continue  with metoprolol. She is  followed by cardiology and is thought to be a poor candidate for anticoagulation due to frequent falls and high risk for injury, advanced age and frailty.  History of mitral valve replacement with bioprosthesis Stable. Continue aspirin  History of pacemaker Stable  History of essential tremors Stable  Thrombocytopenia Patient has a history of chronic ITP. Counts are stable. Continue to monitor.  DVT Prophylaxis: SCDs Code Status: Full code  Family Communication: Discussed with the patient and her daughter Disposition Plan: Seems slightly better today. Continue management as outlined above. Will eventually need to go to SNF.     LOS: 4 days   [place] Pager [phone] 02/01/2015, 10:25 AM  If 7PM-7AM, please contact night-coverage at [email or web address], password [facility]

## 2015-02-02 LAB — CBC
HCT: 35.8 % — ABNORMAL LOW (ref 36.0–46.0)
Hemoglobin: 11.3 g/dL — ABNORMAL LOW (ref 12.0–15.0)
MCH: 30.1 pg (ref 26.0–34.0)
MCHC: 31.6 g/dL (ref 30.0–36.0)
MCV: 95.2 fL (ref 78.0–100.0)
PLATELETS: 104 10*3/uL — AB (ref 150–400)
RBC: 3.76 MIL/uL — AB (ref 3.87–5.11)
RDW: 14.7 % (ref 11.5–15.5)
WBC: 6.9 10*3/uL (ref 4.0–10.5)

## 2015-02-02 LAB — BASIC METABOLIC PANEL
ANION GAP: 6 (ref 5–15)
BUN: 14 mg/dL (ref 6–20)
CALCIUM: 9.4 mg/dL (ref 8.9–10.3)
CO2: 27 mmol/L (ref 22–32)
Chloride: 105 mmol/L (ref 101–111)
Creatinine, Ser: 0.87 mg/dL (ref 0.44–1.00)
GFR, EST NON AFRICAN AMERICAN: 60 mL/min — AB (ref >60)
Glucose, Bld: 91 mg/dL (ref 65–99)
Potassium: 4.5 mmol/L (ref 3.5–5.1)
Sodium: 138 mmol/L (ref 135–145)

## 2015-02-02 MED ORDER — ETOMIDATE 2 MG/ML IV SOLN
INTRAVENOUS | Status: AC
  Filled 2015-02-02: qty 10

## 2015-02-02 MED ORDER — SUCCINYLCHOLINE CHLORIDE 20 MG/ML IJ SOLN
INTRAMUSCULAR | Status: AC
  Filled 2015-02-02: qty 1

## 2015-02-02 MED ORDER — POLYETHYLENE GLYCOL 3350 17 G PO PACK
17.0000 g | PACK | Freq: Every day | ORAL | Status: DC
  Administered 2015-02-02 – 2015-02-04 (×3): 17 g via ORAL
  Filled 2015-02-02 (×3): qty 1

## 2015-02-02 NOTE — Progress Notes (Signed)
Speech Language Pathology Treatment: Dysphagia  Patient Details Name: [name] MRN: [id number] DOB: [birth year] Today's Date: 02/02/2015 Time: [id number] SLP Time Calculation (min) (ACUTE ONLY): 12 min  Assessment / Plan / Recommendation Clinical Impression  Observation with regular texture and thin similar to initial assessments and subsequent session. Pt's diet was downgraded to full liquids 12/17 (unable to find reason in chart). Cough x 1 at end of session. Minimal verbal/visual cues to remove mild lingual residue. Pneumonia persists per CXR (not worsened). SLP uiet upgraded diet to regular. Will follow up x 1.    HPI HPI: 81 y.o. female with a PMHx of CHF, HTN, sick sinus syndrome s/p pacemaker, hiatal hernia, Afib, chronic thrombocytopenia, who present to ED with worsening weakness, inability to ambulate. CXR shows LLL PNA. Pt had MBS in 2011 s/p intubation with recommendation for small bites/sips to increase airawy protection with thin liquids.      SLP Plan  Continue with current plan of care     Recommendations  Diet recommendations: Regular;Thin liquid Liquids provided via: No straw;Cup Medication Administration: Whole meds with puree Supervision: Patient able to self feed;Intermittent supervision to cue for compensatory strategies Compensations: Slow rate;Small sips/bites;Lingual sweep for clearance of pocketing Postural Changes and/or Swallow Maneuvers: Seated upright 90 degrees              Oral Care Recommendations: Oral care BID Follow up Recommendations: None Plan: Continue with current plan of care   Houston Siren 02/02/2015, 10:56 AM  Orbie Pyo Colvin Caroli.Ed [employer] [phone]

## 2015-02-02 NOTE — Progress Notes (Signed)
[facility] PROGRESS NOTE  [name] [id number] DOB: 1929-12-16 DOA: 01/28/2015  PCP:  Melinda Crutch, MD  Brief HPI: 81 year old Caucasian female with a past medical history of congestive heart failure, hypertension, sick sinus syndrome, atrial fibrillation, presented with weakness, cough. She was diagnosed with pneumonia. She failed outpatient treatment and was subsequently hospitalized. She was also noted to have a urinary tract infection.  Past medical history:  Past Medical History  Diagnosis Date  . CHF (congestive heart failure) ([place])   . Edema of both legs 04-23-2013  pt states feet swelling (wears ted hose)    bilateral venous ablation procedures done in IR, L>R  . Hypertension   . Venous insufficiency, peripheral   . H/O hiatal hernia   . Anxiety   . Depression   . Permanent atrial fibrillation ([place])     CARDIOLOGIST-- DR [id number]  . Sick sinus syndrome ([place])     S/P  PACEMAKER  2009  . S/P mitral valve replacement with bioprosthetic valve     04-03-2009  . Right ureteral stone   . Thrombocytopenia, immune     CHRONIC---  HEMOTOLOGIST---  DR ENNEVER  . Osteopenia   . Benign essential tremor   . Schatzki's ring   . Hyperparathyroidism ([place])     MILD--  STABLE  . Hyperlipidemia   . GERD (gastroesophageal reflux disease)   . Basal cell carcinoma of forehead   . Wears glasses   . Kidney stones   . Presence of permanent cardiac pacemaker     LAST PACER CHECK  04-23-2013  IN [facility]  . CAP (community acquired pneumonia) 01/29/2015  . Pneumonia "several times"  . Arthritis     "mostly my hands" (01/29/2015)    Consultants: Cardiology  Procedures: none  Antibiotics: Levaquin was changed to vancomycin and Zosyn 12/17  Subjective: Patient continues to improve. Cough is improving. She is less short of breath. Appears to be more awake and alert. Also able to eat her meals.   Objective: Vital Signs  Filed Vitals:   02/01/15 2042 02/01/15 2159  02/02/15 0545 02/02/15 0924  BP:  119/60 115/49 124/69  Pulse: 92 94 44 85  Temp:  98.7 F (37.1 C) 98 F (36.7 C)   TempSrc:  Oral    Resp: 16 20 18    Height:      Weight:      SpO2: 95% 96% 99% 97%    Intake/Output Summary (Last 24 hours) at 02/02/15 0950 Last data filed at 02/01/15 1355  Gross per 24 hour  Intake    237 ml  Output    375 ml  Net   -138 ml   Filed Weights   01/28/15 1825  Weight: 75.66 kg (166 lb 12.8 oz)    General appearance: Somewhat lethargic this morning. Easily arousable. Resp: Continues to show improved air entry. Less crackles. No wheezing, no rhonchi.  Cardio: S1, S2 is irregularly irregular. No S3, S4. No rubs, murmurs, or bruit. GI: soft, non-tender; bowel sounds normal; no masses,  no organomegaly Extremities: extremities normal, atraumatic, no cyanosis or edema Neurologic: Much more awake and alert. Moving all her extremities. Follows commands.   Lab Results:  Basic Metabolic Panel:  Recent Labs Lab 01/28/15 1320 01/29/15 0533 01/30/15 0510 02/01/15 0545 02/02/15 0653  NA 142 141 137 138 138  K 5.1 4.2 4.5 4.7 4.5  CL 106 108 105 106 105  CO2 29 28 27 25 27   GLUCOSE 126* 87 98 85 91  BUN 21* 19 16 16 14   CREATININE 1.35* 1.12* 1.09* 0.96 0.87  CALCIUM 10.5* 9.4 9.4 9.5 9.4   Liver Function Tests:  Recent Labs Lab 01/28/15 1320  AST 43*  ALT 20  ALKPHOS 43  BILITOT 0.6  PROT 6.2*  ALBUMIN 3.4*   CBC:  Recent Labs Lab 01/28/15 1320 01/29/15 0533 01/30/15 0510 02/01/15 0545 02/02/15 0653  WBC 7.0 5.6 6.0 7.8 6.9  NEUTROABS 4.9  --   --   --   --   HGB 13.1 10.8* 11.5* 10.4* 11.3*  HCT 41.7 35.1* 36.3 33.2* 35.8*  MCV 98.3 97.2 97.3 96.0 95.2  PLT 81* 74* 78* 84* 104*   BNP (last 3 results)  Recent Labs  01/28/15 1916  BNP 135.9*     Recent Results (from the past 240 hour(s))  Urine culture     Status: None   Collection Time: 01/28/15  2:09 PM  Result Value Ref Range Status   Specimen Description  URINE, RANDOM  Final   Special Requests NONE  Final   Culture >=100,000 COLONIES/mL ESCHERICHIA COLI  Final   Report Status 01/31/2015 FINAL  Final   Organism ID, Bacteria ESCHERICHIA COLI  Final      Susceptibility   Escherichia coli - MIC*    AMPICILLIN <=2 SENSITIVE Sensitive     CEFAZOLIN <=4 SENSITIVE Sensitive     CEFTRIAXONE <=1 SENSITIVE Sensitive     CIPROFLOXACIN >=4 RESISTANT Resistant     GENTAMICIN <=1 SENSITIVE Sensitive     IMIPENEM <=0.25 SENSITIVE Sensitive     NITROFURANTOIN <=16 SENSITIVE Sensitive     TRIMETH/SULFA <=20 SENSITIVE Sensitive     AMPICILLIN/SULBACTAM <=2 SENSITIVE Sensitive     PIP/TAZO <=4 SENSITIVE Sensitive     * >=100,000 COLONIES/mL ESCHERICHIA COLI  Culture, expectorated sputum-assessment     Status: None   Collection Time: 01/29/15 12:47 PM  Result Value Ref Range Status   Specimen Description SPUTUM  Final   Special Requests NONE  Final   Sputum evaluation   Final    MICROSCOPIC FINDINGS SUGGEST THAT THIS SPECIMEN IS NOT REPRESENTATIVE OF LOWER RESPIRATORY SECRETIONS. PLEASE RECOLLECT. Gram Stain Report Called to,Read Back By and Verified With: AMilana Obey AT [id number] ON [id number] BY Rhea Bleacher    Report Status 01/29/2015 FINAL  Final  Urine culture     Status: None   Collection Time: 01/29/15  3:03 PM  Result Value Ref Range Status   Specimen Description URINE, RANDOM  Final   Special Requests NONE  Final   Culture >=100,000 COLONIES/mL ESCHERICHIA COLI  Final   Report Status 01/31/2015 FINAL  Final   Organism ID, Bacteria ESCHERICHIA COLI  Final      Susceptibility   Escherichia coli - MIC*    AMPICILLIN <=2 SENSITIVE Sensitive     CEFAZOLIN <=4 SENSITIVE Sensitive     CEFTRIAXONE <=1 SENSITIVE Sensitive     CIPROFLOXACIN >=4 RESISTANT Resistant     GENTAMICIN <=1 SENSITIVE Sensitive     IMIPENEM <=0.25 SENSITIVE Sensitive     NITROFURANTOIN <=16 SENSITIVE Sensitive     TRIMETH/SULFA <=20 SENSITIVE Sensitive      AMPICILLIN/SULBACTAM <=2 SENSITIVE Sensitive     PIP/TAZO <=4 SENSITIVE Sensitive     * >=100,000 COLONIES/mL ESCHERICHIA COLI  Culture, blood (Routine X 2) w Reflex to ID Panel     Status: None (Preliminary result)   Collection Time: 01/31/15  7:28 PM  Result Value Ref Range Status   Specimen Description LEFT  ANTECUBITAL  Final   Special Requests BOTTLES DRAWN AEROBIC AND ANAEROBIC 6CC  Final   Culture NO GROWTH < 12 HOURS  Final   Report Status PENDING  Incomplete  Culture, blood (Routine X 2) w Reflex to ID Panel     Status: None (Preliminary result)   Collection Time: 01/31/15  7:33 PM  Result Value Ref Range Status   Specimen Description RIGHT ANTECUBITAL  Final   Special Requests BOTTLES DRAWN AEROBIC AND ANAEROBIC 5CC  Final   Culture NO GROWTH < 12 HOURS  Final   Report Status PENDING  Incomplete      Studies/Results: Dg Chest Port 1 View  01/31/2015  CLINICAL DATA:  Dyspnea. EXAM: PORTABLE CHEST 1 VIEW COMPARISON:  01/28/2015 FINDINGS: There is a left chest wall pacer device with lead in the right atrial appendage and right ventricle. Moderate cardiac enlargement. Aortic atherosclerosis noted. There is mild diffuse edema. Increased from previous exam Airspace consolidation noted within the left lower lobe. IMPRESSION: 1. Cardiac enlargement and edema consistent with CHF. 2. Persistent left base opacity which may represent pneumonia. Electronically Signed   By: Kerby Moors M.D.   On: 01/31/2015 18:22    Medications:  Scheduled: . ALPRAZolam  0.5 mg Oral QHS  . aspirin  81 mg Oral Daily  . docusate sodium  100 mg Oral BID  . guaiFENesin  1,200 mg Oral BID  . ipratropium  0.5 mg Nebulization TID  . levalbuterol  0.63 mg Nebulization TID  . metoprolol tartrate  12.5 mg Oral BID  . pantoprazole  40 mg Oral Daily  . piperacillin-tazobactam (ZOSYN)  IV  3.375 g Intravenous Q8H  . saccharomyces boulardii  250 mg Oral BID  . simvastatin  10 mg Oral QPC supper  . sodium  chloride  3 mL Intravenous Q12H  . sodium chloride  3 mL Intravenous Q12H  . vancomycin  1,000 mg Intravenous Q24H   Continuous:   [id number] chloride, acetaminophen **OR** acetaminophen, guaiFENesin, haloperidol lactate, HYDROcodone-acetaminophen, levalbuterol, ondansetron **OR** ondansetron (ZOFRAN) IV, sodium chloride  Assessment/Plan:  Active Problems:   Chronic diastolic congestive heart failure (HCC)   Thrombocytopenia (HCC)   Acute renal failure (HCC)   S/P mitral valve replacement with bioprosthetic valve - 29 mm Biocor   Permanent atrial fibrillation (HCC)   Pneumonia, community acquired   PNA (pneumonia)   Acute encephalopathy    Community-acquired pneumonia Patient failed outpatient treatment. She was started on IV Levaquin. Initially it did appear that she was improving. However, her cough got worse. Chest x-ray was repeated which revealed increasing infiltrate on the left. This prompted a change in antibiotics. She was started on vancomycin and Zosyn. Repeat blood Cultures are negative so far. She is definitely improving. Continue to monitor for now. Continue current management.   Urinary tract infection with Escherichia coli Sensitivities noted. Current antibiotics should cover.  Acute encephalopathy Mental status has improved over the last 48 hours. Seroquel could have caused her to be more confused. This has been discontinued. Plus she was also on Levaquin which can also cause confusion in elderly. This has also been discontinued. Continue to monitor closely. She has had 2 CT scans of her head, both of which did not reveal any acute findings. Cerebral atrophy was noted, implying that she may have underlying cognitive impairment.   Chronic diastolic congestive heart failure Reasonably well compensated for now. Does not appear to be in acute exacerbation. Continue to monitor closely.  History of essential hypertension Monitor blood pressures  closely. Holding  amlodipine.  History of atrial fibrillation Heart rate is reasonably well controlled. Continue with metoprolol. She is followed by cardiology and is thought to be a poor candidate for anticoagulation due to frequent falls and high risk for injury, advanced age and frailty.  History of mitral valve replacement with bioprosthesis Stable. Continue aspirin  History of pacemaker Stable  History of essential tremors Stable  Thrombocytopenia Patient has a history of chronic ITP. Counts are stable. Continue to monitor.  DVT Prophylaxis: SCDs Code Status: Full code  Family Communication: Discussed with the patient. Discussed with the daughter. Disposition Plan: Patient is improving. Continue management as outlined above. Will eventually need to go to SNF. Anticipate discharge 48-72 hours.    LOS: 5 days   [place] [facility] Pager [phone] 02/02/2015, 9:50 AM  If 7PM-7AM, please contact night-coverage at [email or web address], password [facility]

## 2015-02-02 NOTE — NC FL2 (Signed)
[place] LEVEL OF CARE SCREENING TOOL     IDENTIFICATION  Patient Name: [name] Birthdate: 1929-11-11 Sex: female Admission Date (Current Location): 01/28/2015  [facility] and [place] Number: Herbalist and Address:  [facility] [place], [place] [address], [place], [place] [address]      Provider Number: [id number]  Attending Physician Name and Address:  Bonnielee Haff, MD  Relative Name and Phone Number:  Derek Jack [name] [phone]    Current Level of Care: Hospital Recommended Level of Care: [place] Prior Approval Number:    Date Approved/Denied:   PASRR Number: [id number]  Discharge Plan: SNF    Current Diagnoses: Patient Active Problem List   Diagnosis Date Noted  . Acute encephalopathy 01/31/2015  . Pneumonia, community acquired 01/28/2015  . PNA (pneumonia) 01/28/2015  . Pacemaker 05/06/2014  . Bilateral leg edema 08/20/2013  . S/P mitral valve replacement with bioprosthetic valve - 29 mm Biocor 04/22/2013  . Permanent atrial fibrillation ([place]) 04/22/2013  . HTN (hypertension) 04/22/2013  . Hyperlipidemia 04/22/2013  . Hypercalcemia 11/28/2012  . Ureteral stone 08/02/2012  . Acute renal failure ([place]) 08/02/2012  . Thrombocytopenia ([place]) 01/20/2011  . Chronic diastolic congestive heart failure ([place]) 04/04/2007  . BRONCHOPNEUMONIA ORGANISM UNSPECIFIED 03/13/2007    Orientation RESPIRATION BLADDER Height & Weight    Self, Time, Situation, Place  Normal Continent   166 lbs.  BEHAVIORAL SYMPTOMS/MOOD NEUROLOGICAL BOWEL NUTRITION STATUS      Continent  (Cardiac)  AMBULATORY STATUS COMMUNICATION OF NEEDS Skin   Extensive Assist Verbally Normal                       Personal Care Assistance Level of Assistance  Bathing, Feeding, Dressing Bathing Assistance: Maximum assistance Feeding assistance: Independent Dressing Assistance: Limited assistance     Functional Limitations Info              SPECIAL CARE FACTORS FREQUENCY  PT (By licensed PT), OT (By licensed OT)     PT Frequency: 5x/week OT Frequency: 2x/week            Contractures      Additional Factors Info  Code Status, Allergies, Psychotropic Code Status Info: Full Allergies Info: NKA Psychotropic Info: Xanax         Current Medications (02/02/2015):  This is the current hospital active medication list Current Facility-Administered Medications  Medication Dose Route Frequency Provider Last Rate Last Dose  . 0.9 %  sodium chloride infusion  250 mL Intravenous PRN Belkys A Regalado, MD      . acetaminophen (TYLENOL) tablet 650 mg  650 mg Oral Q6H PRN Belkys A Regalado, MD       Or  . acetaminophen (TYLENOL) suppository 650 mg  650 mg Rectal Q6H PRN Belkys A Regalado, MD      . ALPRAZolam Duanne Moron) tablet 0.5 mg  0.5 mg Oral QHS Belkys A Regalado, MD   0.5 mg at 02/01/15 2204  . aspirin chewable tablet 81 mg  81 mg Oral Daily Bonnielee Haff, MD   81 mg at 02/01/15 1034  . docusate sodium (COLACE) capsule 100 mg  100 mg Oral BID Belkys A Regalado, MD   100 mg at 02/01/15 2204  . guaiFENesin (MUCINEX) 12 hr tablet 1,200 mg  1,200 mg Oral BID Bonnielee Haff, MD   1,200 mg at 02/01/15 2204  . guaiFENesin (ROBITUSSIN) 100 MG/5ML solution 100 mg  5 mL Oral Q4H PRN Gokul  [name], MD   100 mg at 01/31/15 1727  . haloperidol lactate (HALDOL) injection 1 mg  1 mg Intravenous Q6H PRN Bonnielee Haff, MD      . HYDROcodone-acetaminophen (NORCO/VICODIN) 5-325 MG per tablet 1-2 tablet  1-2 tablet Oral Q4H PRN Elmarie Shiley, MD   2 tablet at 01/29/15 0011  . ipratropium (ATROVENT) nebulizer solution 0.5 mg  0.5 mg Nebulization TID Bonnielee Haff, MD      . levalbuterol Penne Lash) nebulizer solution 0.63 mg  0.63 mg Nebulization Q4H PRN Bonnielee Haff, MD      . levalbuterol Penne Lash) nebulizer solution 0.63 mg  0.63 mg Nebulization TID Bonnielee Haff, MD      . metoprolol tartrate (LOPRESSOR) tablet 12.5 mg   12.5 mg Oral BID Belkys A Regalado, MD   12.5 mg at 02/01/15 2204  . ondansetron (ZOFRAN) tablet 4 mg  4 mg Oral Q6H PRN Belkys A Regalado, MD       Or  . ondansetron (ZOFRAN) injection 4 mg  4 mg Intravenous Q6H PRN Belkys A Regalado, MD   4 mg at 01/29/15 1403  . pantoprazole (PROTONIX) EC tablet 40 mg  40 mg Oral Daily Belkys A Regalado, MD   40 mg at 02/01/15 1034  . piperacillin-tazobactam (ZOSYN) IVPB 3.375 g  3.375 g Intravenous Q8H Donalynn Furlong [place], RPH   3.375 g at 02/02/15 0429  . saccharomyces boulardii (FLORASTOR) capsule 250 mg  250 mg Oral BID Bonnielee Haff, MD   250 mg at 02/01/15 2204  . simvastatin (ZOCOR) tablet 10 mg  10 mg Oral QPC supper Belkys A Regalado, MD   10 mg at 02/01/15 1707  . sodium chloride 0.9 % injection 3 mL  3 mL Intravenous Q12H Belkys A Regalado, MD   3 mL at 01/29/15 1000  . sodium chloride 0.9 % injection 3 mL  3 mL Intravenous Q12H Belkys A Regalado, MD   3 mL at 02/01/15 2212  . sodium chloride 0.9 % injection 3 mL  3 mL Intravenous PRN Belkys A Regalado, MD      . vancomycin (VANCOCIN) IVPB 1000 mg/200 mL premix  1,000 mg Intravenous Q24H Donalynn Furlong [place], RPH   1,000 mg at 02/01/15 [id number]     Discharge Medications: Please see discharge summary for a list of discharge medications.  Relevant Imaging Results:  Relevant Lab Results:   Additional Information SSN: [id number]  Benard Halsted, LCSWA

## 2015-02-02 NOTE — Care Management Important Message (Signed)
Important Message  Patient Details  Name: [name] MRN: [id number] Date of Birth: July 31, 1929   Medicare Important Message Given:  Yes    Nathen May 02/02/2015, 12:23 PM

## 2015-02-03 MED ORDER — AMOXICILLIN-POT CLAVULANATE 875-125 MG PO TABS
1.0000 | ORAL_TABLET | Freq: Two times a day (BID) | ORAL | Status: DC
  Administered 2015-02-03 – 2015-02-04 (×3): 1 via ORAL
  Filled 2015-02-03 (×3): qty 1

## 2015-02-03 NOTE — Progress Notes (Signed)
[facility] PROGRESS NOTE  [name] [id number] DOB: 07-05-1929 DOA: 01/28/2015  PCP:  Melinda Crutch, MD  Brief HPI: 81 year old Caucasian female with a past medical history of congestive heart failure, hypertension, sick sinus syndrome, atrial fibrillation, presented with weakness, cough. She was diagnosed with pneumonia. She failed outpatient treatment and was subsequently hospitalized. She was also noted to have a urinary tract infection. She has been very slow to respond. Repeat chest x-ray revealed worsening infiltrate, so she was changed over to broad-spectrum antibiotic coverage. Finally started improving.  Past medical history:  Past Medical History  Diagnosis Date  . CHF (congestive heart failure) ([place])   . Edema of both legs 04-23-2013  pt states feet swelling (wears ted hose)    bilateral venous ablation procedures done in IR, L>R  . Hypertension   . Venous insufficiency, peripheral   . H/O hiatal hernia   . Anxiety   . Depression   . Permanent atrial fibrillation ([place])     CARDIOLOGIST-- DR [id number]  . Sick sinus syndrome ([place])     S/P  PACEMAKER  2009  . S/P mitral valve replacement with bioprosthetic valve     04-03-2009  . Right ureteral stone   . Thrombocytopenia, immune     CHRONIC---  HEMOTOLOGIST---  DR ENNEVER  . Osteopenia   . Benign essential tremor   . Schatzki's ring   . Hyperparathyroidism ([place])     MILD--  STABLE  . Hyperlipidemia   . GERD (gastroesophageal reflux disease)   . Basal cell carcinoma of forehead   . Wears glasses   . Kidney stones   . Presence of permanent cardiac pacemaker     LAST PACER CHECK  04-23-2013  IN EPIC  . CAP (community acquired pneumonia) 01/29/2015  . Pneumonia "several times"  . Arthritis     "mostly my hands" (01/29/2015)    Consultants: Cardiology has signed off  Procedures: none  Antibiotics: Levaquin was changed to vancomycin and Zosyn 12/17 Vancomycin, Zosyn, to be stopped on  12/20 Augmentin to be initiated 12/20.  Subjective: Patient continues to feel better. Still has a cough but not as good as before. Breathing is improved. Denies any chest pain. Appetite is improved as well.   Objective: Vital Signs  Filed Vitals:   02/02/15 2023 02/02/15 2104 02/03/15 0608 02/03/15 0859  BP:  117/54 118/67 122/61  Pulse:  89 86 96  Temp:  98.4 F (36.9 C) 98.7 F (37.1 C)   TempSrc:  Oral Oral   Resp:  18 18   Height:      Weight:      SpO2: 98% 93% 96%     Intake/Output Summary (Last 24 hours) at 02/03/15 0933 Last data filed at 02/03/15 [id number]  Gross per 24 hour  Intake    860 ml  Output    350 ml  Net    510 ml   Filed Weights   01/28/15 1825  Weight: 75.66 kg (166 lb 12.8 oz)    General appearance: Somewhat lethargic this morning. Easily arousable. Resp: Continues to show improved air entry. Less crackles. No wheezing, no rhonchi.  Cardio: S1, S2 is irregularly irregular. No S3, S4. No rubs, murmurs, or bruit. GI: soft, non-tender; bowel sounds normal; no masses,  no organomegaly Neurologic: Much more awake and alert. Moving all her extremities. Follows commands.   Lab Results:  Basic Metabolic Panel:  Recent Labs Lab 01/28/15 1320 01/29/15 0533 01/30/15 0510 02/01/15 0545 02/02/15 [id number]  NA 142 141 137 138 138  K 5.1 4.2 4.5 4.7 4.5  CL 106 108 105 106 105  CO2 29 28 27 25 27   GLUCOSE 126* 87 98 85 91  BUN 21* 19 16 16 14   CREATININE 1.35* 1.12* 1.09* 0.96 0.87  CALCIUM 10.5* 9.4 9.4 9.5 9.4   Liver Function Tests:  Recent Labs Lab 01/28/15 1320  AST 43*  ALT 20  ALKPHOS 43  BILITOT 0.6  PROT 6.2*  ALBUMIN 3.4*   CBC:  Recent Labs Lab 01/28/15 1320 01/29/15 0533 01/30/15 0510 02/01/15 0545 02/02/15 0653  WBC 7.0 5.6 6.0 7.8 6.9  NEUTROABS 4.9  --   --   --   --   HGB 13.1 10.8* 11.5* 10.4* 11.3*  HCT 41.7 35.1* 36.3 33.2* 35.8*  MCV 98.3 97.2 97.3 96.0 95.2  PLT 81* 74* 78* 84* 104*   BNP (last 3  results)  Recent Labs  01/28/15 1916  BNP 135.9*     Recent Results (from the past 240 hour(s))  Urine culture     Status: None   Collection Time: 01/28/15  2:09 PM  Result Value Ref Range Status   Specimen Description URINE, RANDOM  Final   Special Requests NONE  Final   Culture >=100,000 COLONIES/mL ESCHERICHIA COLI  Final   Report Status 01/31/2015 FINAL  Final   Organism ID, Bacteria ESCHERICHIA COLI  Final      Susceptibility   Escherichia coli - MIC*    AMPICILLIN <=2 SENSITIVE Sensitive     CEFAZOLIN <=4 SENSITIVE Sensitive     CEFTRIAXONE <=1 SENSITIVE Sensitive     CIPROFLOXACIN >=4 RESISTANT Resistant     GENTAMICIN <=1 SENSITIVE Sensitive     IMIPENEM <=0.25 SENSITIVE Sensitive     NITROFURANTOIN <=16 SENSITIVE Sensitive     TRIMETH/SULFA <=20 SENSITIVE Sensitive     AMPICILLIN/SULBACTAM <=2 SENSITIVE Sensitive     PIP/TAZO <=4 SENSITIVE Sensitive     * >=100,000 COLONIES/mL ESCHERICHIA COLI  Culture, expectorated sputum-assessment     Status: None   Collection Time: 01/29/15 12:47 PM  Result Value Ref Range Status   Specimen Description SPUTUM  Final   Special Requests NONE  Final   Sputum evaluation   Final    MICROSCOPIC FINDINGS SUGGEST THAT THIS SPECIMEN IS NOT REPRESENTATIVE OF LOWER RESPIRATORY SECRETIONS. PLEASE RECOLLECT. Gram Stain Report Called to,Read Back By and Verified With: AMilana Obey AT [id number] ON [id number] BY Rhea Bleacher    Report Status 01/29/2015 FINAL  Final  Urine culture     Status: None   Collection Time: 01/29/15  3:03 PM  Result Value Ref Range Status   Specimen Description URINE, RANDOM  Final   Special Requests NONE  Final   Culture >=100,000 COLONIES/mL ESCHERICHIA COLI  Final   Report Status 01/31/2015 FINAL  Final   Organism ID, Bacteria ESCHERICHIA COLI  Final      Susceptibility   Escherichia coli - MIC*    AMPICILLIN <=2 SENSITIVE Sensitive     CEFAZOLIN <=4 SENSITIVE Sensitive     CEFTRIAXONE <=1 SENSITIVE Sensitive      CIPROFLOXACIN >=4 RESISTANT Resistant     GENTAMICIN <=1 SENSITIVE Sensitive     IMIPENEM <=0.25 SENSITIVE Sensitive     NITROFURANTOIN <=16 SENSITIVE Sensitive     TRIMETH/SULFA <=20 SENSITIVE Sensitive     AMPICILLIN/SULBACTAM <=2 SENSITIVE Sensitive     PIP/TAZO <=4 SENSITIVE Sensitive     * >=100,000 COLONIES/mL ESCHERICHIA COLI  Culture, blood (  Routine X 2) w Reflex to ID Panel     Status: None (Preliminary result)   Collection Time: 01/31/15  7:28 PM  Result Value Ref Range Status   Specimen Description BLOOD LEFT ANTECUBITAL  Final   Special Requests BOTTLES DRAWN AEROBIC AND ANAEROBIC 6CC  Final   Culture NO GROWTH 2 DAYS  Final   Report Status PENDING  Incomplete  Culture, blood (Routine X 2) w Reflex to ID Panel     Status: None (Preliminary result)   Collection Time: 01/31/15  7:33 PM  Result Value Ref Range Status   Specimen Description BLOOD RIGHT ANTECUBITAL  Final   Special Requests BOTTLES DRAWN AEROBIC AND ANAEROBIC 5CC  Final   Culture NO GROWTH 2 DAYS  Final   Report Status PENDING  Incomplete      Studies/Results: No results found.  Medications:  Scheduled: . ALPRAZolam  0.5 mg Oral QHS  . amoxicillin-clavulanate  1 tablet Oral Q12H  . aspirin  81 mg Oral Daily  . docusate sodium  100 mg Oral BID  . guaiFENesin  1,200 mg Oral BID  . ipratropium  0.5 mg Nebulization TID  . levalbuterol  0.63 mg Nebulization TID  . metoprolol tartrate  12.5 mg Oral BID  . pantoprazole  40 mg Oral Daily  . polyethylene glycol  17 g Oral Daily  . saccharomyces boulardii  250 mg Oral BID  . simvastatin  10 mg Oral QPC supper  . sodium chloride  3 mL Intravenous Q12H  . sodium chloride  3 mL Intravenous Q12H   Continuous:   [id number] chloride, acetaminophen **OR** acetaminophen, guaiFENesin, haloperidol lactate, HYDROcodone-acetaminophen, levalbuterol, ondansetron **OR** ondansetron (ZOFRAN) IV, sodium chloride  Assessment/Plan:  Active Problems:   Chronic diastolic  congestive heart failure (HCC)   Thrombocytopenia (HCC)   Acute renal failure (HCC)   S/P mitral valve replacement with bioprosthetic valve - 29 mm Biocor   Permanent atrial fibrillation (HCC)   Pneumonia, community acquired   PNA (pneumonia)   Acute encephalopathy    Community-acquired pneumonia Patient failed outpatient treatment. She was started on IV Levaquin. Initially it did appear that she was improving. However, her cough got worse. Chest x-ray was repeated which revealed increasing infiltrate on the left. This prompted a change in antibiotics. She was started on vancomycin and Zosyn. Repeat blood Cultures are negative so far. She is definitely improving. Change to Augmentin today. Patient also underwent swallow evaluation and was thought to have only low risk for aspiration.   Urinary tract infection with Escherichia coli Sensitivities noted. Current antibiotics should cover.  Acute encephalopathy Mental status has improved. Seroquel could have caused her to be more confused. This has been discontinued. Plus she was also on Levaquin which can also cause confusion in elderly. This has also been discontinued. Continue to monitor closely. She has had 2 CT scans of her head, both of which did not reveal any acute findings. Cerebral atrophy was noted, implying that she may have underlying cognitive impairment.   Chronic diastolic congestive heart failure Reasonably well compensated for now. Does not appear to be in acute exacerbation. Continue to monitor closely.  History of essential hypertension Monitor blood pressures closely. Holding amlodipine.  History of atrial fibrillation Heart rate is reasonably well controlled. Continue with metoprolol. She is followed by cardiology and is thought to be a poor candidate for anticoagulation due to frequent falls and high risk for injury, advanced age and frailty.  History of mitral valve replacement with bioprosthesis Stable.  Continue  aspirin  History of pacemaker Stable  History of essential tremors Stable  Thrombocytopenia Patient has a history of chronic ITP. Counts are stable. Continue to monitor.  DVT Prophylaxis: SCDs Code Status: Full code  Family Communication: Discussed with the patient. Discussed with the daughter. Disposition Plan: Patient is improving slowly. Continue management as outlined above. Will eventually need to go to SNF. Anticipate discharge 24-48 hours.    LOS: 6 days   [facility] Pager [phone] 02/03/2015, 9:33 AM  If 7PM-7AM, please contact night-coverage at [email or web address], password [facility]

## 2015-02-03 NOTE — Progress Notes (Signed)
Occupational Therapy Treatment Patient Details Name: [name] MRN: [id number] DOB: 11/01/1929 Today's Date: 02/03/2015    History of present illness Pt adm with PNA. PMHx of CHF, HTN, sick sinus syndrome s/p pacemaker, Afib   OT comments  Pt eager to get OOB. Improve cognition and activity tolerance. Continues to need SNF level rehab.  Follow Up Recommendations  SNF;Supervision/Assistance - 24 hour    Equipment Recommendations       Recommendations for Other Services      Precautions / Restrictions Precautions Precautions: Fall Restrictions Weight Bearing Restrictions: No       Mobility Bed Mobility Overal bed mobility: Needs Assistance Bed Mobility: Supine to Sit     Supine to sit: Supervision     General bed mobility comments: used rail, HOB up slightly  Transfers   Equipment used: Rolling walker (2 wheeled) Transfers: Sit to/from [employer] to Stand: Min assist Stand pivot transfers: Min assist       General transfer comment: cues to align hips with sitting surface prior to sitting    Balance     Sitting balance-Leahy Scale: Fair       Standing balance-Leahy Scale: Poor                     ADL Overall ADL's : Needs assistance/impaired Eating/Feeding: Set up;Sitting   Grooming: Wash/dry hands;Wash/dry face;Sitting;Set up                   Toilet Transfer: Minimal assistance;RW;Stand-pivot;BSC   Toileting- Clothing Manipulation and Hygiene: Minimal assistance;Sit to/from stand         General ADL Comments: Pt initially dizzy upon sitting up, dissipated once up for a few minutes.      Vision                     Perception     Praxis      Cognition   Behavior During Therapy: WFL for tasks assessed/performed Overall Cognitive Status: Within Functional Limits for tasks assessed                       Extremity/Trunk Assessment               Exercises     Shoulder  Instructions       General Comments      Pertinent Vitals/ Pain       Pain Assessment: No/denies pain  Home Living                                          Prior Functioning/Environment              Frequency Min 2X/week     Progress Toward Goals  OT Goals(current goals can now be found in the care plan section)  Progress towards OT goals: Progressing toward goals     Plan Discharge plan remains appropriate    Co-evaluation                 End of Session Equipment Utilized During Treatment: Gait belt;Rolling walker;Oxygen   Activity Tolerance Patient tolerated treatment well   Patient Left in chair;with call bell/phone within reach;with chair alarm set;with nursing/sitter in room   Nurse Communication          Time: [id number] OT Time Calculation (min): 25 min  Charges: OT General Charges $OT Visit: 1 Procedure OT Treatments $Self Care/Home Management : 23-37 mins  Malka So 02/03/2015, 9:09 AM  [phone]

## 2015-02-03 NOTE — Progress Notes (Signed)
Physical Therapy Treatment Patient Details Name: Nylani Rochell MRN: [id number] DOB: Apr 19, 1929 Today's Date: 03-03-15    History of Present Illness Pt adm with PNA. PMHx of CHF, HTN, sick sinus syndrome s/p pacemaker, Afib    PT Comments    Pt with improved cognition and mobility but continue to feel she needs ST-SNF prior to return home.  Follow Up Recommendations  SNF     Equipment Recommendations  None recommended by PT    Recommendations for Other Services       Precautions / Restrictions Precautions Precautions: Fall    Mobility  Bed Mobility               General bed mobility comments: Pt up in chair.  Transfers Overall transfer level: Needs assistance Equipment used: Rolling walker (2 wheeled) Transfers: Sit to/from Stand Sit to Stand: Min assist         General transfer comment: Verbal cues for hand placement. Assist to bring hips up.   Ambulation/Gait Ambulation/Gait assistance: Min assist Ambulation Distance (Feet): 100 Feet Assistive device: Rolling walker (2 wheeled) Gait Pattern/deviations: Step-through pattern;Decreased step length - right;Decreased step length - left;Trunk flexed Gait velocity: decr Gait velocity interpretation: <1.8 ft/sec, indicative of risk for recurrent falls General Gait Details: Assist for balance and verbal cues to stand more erect   Stairs            Wheelchair Mobility    Modified Rankin (Stroke Patients Only)       Balance   Sitting-balance support: No upper extremity supported;Feet supported Sitting balance-Leahy Scale: Fair     Standing balance support: Bilateral upper extremity supported Standing balance-Leahy Scale: Poor Standing balance comment: support of walker and min guard assist                    Cognition Arousal/Alertness: Awake/alert Behavior During Therapy: WFL for tasks assessed/performed Overall Cognitive Status: Within Functional Limits for tasks assessed                       Exercises      General Comments        Pertinent Vitals/Pain Pain Assessment: No/denies pain    Home Living                      Prior Function            PT Goals (current goals can now be found in the care plan section) Acute Rehab PT Goals Patient Stated Goal: return home Progress towards PT goals: Progressing toward goals    Frequency  Min 2X/week    PT Plan Current plan remains appropriate;Frequency needs to be updated    Co-evaluation             End of Session   Activity Tolerance: Patient tolerated treatment well Patient left: in chair;with chair alarm set;with call bell/phone within reach     Time: 1154-1208 PT Time Calculation (min) (ACUTE ONLY): 14 min  Charges:  $Gait Training: 8-22 mins                    G Codes:      Svetlana Bagby 03-03-15, 1:46 PM [employer] PT [phone]

## 2015-02-03 NOTE — Progress Notes (Signed)
Pharmacist Provided - Patient Medication Education    [name] is an 81 y.o. female who presented to [facility] on 01/28/2015 with a chief complaint of  Chief Complaint  Patient presents with  . Fall  . Neck Pain    Assessment: Reviewed patient's current medications with her and answered all questions. Conversation focused more on her recent abx change to Augmentin.   Time spent preparing for discharge counseling: 94mins Time spent counseling patient: 31mins  [name], PharmD 02/03/2015, 6:03 PM

## 2015-02-04 ENCOUNTER — Inpatient Hospital Stay (HOSPITAL_COMMUNITY): Payer: Medicare Other

## 2015-02-04 DIAGNOSIS — G9340 Encephalopathy, unspecified: Secondary | ICD-10-CM

## 2015-02-04 LAB — CBC
HEMATOCRIT: 34 % — AB (ref 36.0–46.0)
Hemoglobin: 11.1 g/dL — ABNORMAL LOW (ref 12.0–15.0)
MCH: 31 pg (ref 26.0–34.0)
MCHC: 32.6 g/dL (ref 30.0–36.0)
MCV: 95 fL (ref 78.0–100.0)
PLATELETS: 128 10*3/uL — AB (ref 150–400)
RBC: 3.58 MIL/uL — AB (ref 3.87–5.11)
RDW: 14.7 % (ref 11.5–15.5)
WBC: 6.1 10*3/uL (ref 4.0–10.5)

## 2015-02-04 LAB — BASIC METABOLIC PANEL
ANION GAP: 5 (ref 5–15)
BUN: 15 mg/dL (ref 6–20)
CHLORIDE: 106 mmol/L (ref 101–111)
CO2: 27 mmol/L (ref 22–32)
Calcium: 9.9 mg/dL (ref 8.9–10.3)
Creatinine, Ser: 0.92 mg/dL (ref 0.44–1.00)
GFR calc Af Amer: >60 mL/min (ref >60)
GFR, EST NON AFRICAN AMERICAN: 56 mL/min — AB (ref >60)
GLUCOSE: 104 mg/dL — AB (ref 65–99)
POTASSIUM: 4.5 mmol/L (ref 3.5–5.1)
Sodium: 138 mmol/L (ref 135–145)

## 2015-02-04 MED ORDER — GUAIFENESIN ER 600 MG PO TB12: 1200.0000 mg | ORAL_TABLET | Freq: Two times a day (BID) | ORAL | Status: DC

## 2015-02-04 MED ORDER — FUROSEMIDE 40 MG PO TABS
40.0000 mg | ORAL_TABLET | Freq: Every morning | ORAL | Status: DC
Start: 2015-02-04 — End: 2015-02-04
  Administered 2015-02-04: 40 mg via ORAL
  Filled 2015-02-04: qty 1

## 2015-02-04 MED ORDER — SACCHAROMYCES BOULARDII 250 MG PO CAPS: 250.0000 mg | ORAL_CAPSULE | Freq: Two times a day (BID) | ORAL | Status: DC

## 2015-02-04 MED ORDER — IPRATROPIUM BROMIDE 0.02 % IN SOLN: 0.5000 mg | Freq: Three times a day (TID) | RESPIRATORY_TRACT | Status: DC

## 2015-02-04 MED ORDER — LEVALBUTEROL HCL 0.63 MG/3ML IN NEBU: 0.6300 mg | INHALATION_SOLUTION | Freq: Three times a day (TID) | RESPIRATORY_TRACT | Status: DC

## 2015-02-04 MED ORDER — DOCUSATE SODIUM 100 MG PO CAPS: 100.0000 mg | ORAL_CAPSULE | Freq: Two times a day (BID) | ORAL | Status: DC

## 2015-02-04 MED ORDER — AMOXICILLIN-POT CLAVULANATE 875-125 MG PO TABS: 1.0000 | ORAL_TABLET | Freq: Two times a day (BID) | ORAL | Status: DC

## 2015-02-04 NOTE — Progress Notes (Signed)
Speech Language Pathology Treatment: Dysphagia  Patient Details Name: Rajene Quinter MRN: [id number] DOB: 03/16/1929 Today's Date: 02/04/2015 Time: [id number] SLP Time Calculation (min) (ACUTE ONLY): 17 min  Assessment / Plan / Recommendation Clinical Impression  F/u diet tolerance assessment complete. Diet has been advanced to bland/soft per MD. Skilled observation complete with regular texture solids and thin liquids per previous SLP recommendations. Overall, patient with independent use of safe swallowing precautions including small bites and sips, slow rate of intake and alternation of bites and sips to aid in esophageal transit. Intermittent coughing noted with po intake today. Difficult to determine baseline cough with PNA from overt indication of aspiration. Given h/o dysphagia, h/o multiple esophageal deficits (GERD, hiatal hernia, Schatzkis ring), and acute PNA, recommend instrumental exam to ensure that least restrictive diet is being consumed. Plan for MBS this pm.    HPI HPI: 81 y.o. female with a PMHx of CHF, HTN, sick sinus syndrome s/p pacemaker, hiatal hernia, Afib, chronic thrombocytopenia, who present to ED with worsening weakness, inability to ambulate. CXR shows LLL PNA. Pt had MBS in 2011 s/p intubation with recommendation for small bites/sips to increase airawy protection with thin liquids.      SLP Plan  MBS     Recommendations  Diet recommendations: Regular;Thin liquid Liquids provided via: No straw;Cup Medication Administration: Whole meds with puree Supervision: Patient able to self feed;Intermittent supervision to cue for compensatory strategies Compensations: Slow rate;Small sips/bites;Lingual sweep for clearance of pocketing Postural Changes and/or Swallow Maneuvers: Seated upright 90 degrees              Oral Care Recommendations: Oral care BID Follow up Recommendations: None Plan: MBS  Ashur Glatfelter MA, CCC-SLP [phone]  Ninamarie Keel  Meryl 02/04/2015, 10:40 AM

## 2015-02-04 NOTE — Progress Notes (Signed)
D/c to SNF via [facility]  

## 2015-02-04 NOTE — Progress Notes (Signed)
Patient will DC to: [place] Anticipated DC date: 02/04/15 Family notified: [name], daughter Transport by: PTAR 4pm  [place] signing off.  Cedric Fishman, [place] Social Worker [phone]

## 2015-02-04 NOTE — Progress Notes (Signed)
MBSS complete. Full report located under chart review in imaging section.  Travante Knee Paiewonsky, M.A. CCC-SLP [phone]  
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
## 2015-02-04 NOTE — Care Management Note (Signed)
Case Management Note  Patient Details  Name: [name] MRN: [id number] Date of Birth: [birth year]  Subjective/Objective:   Patient for dc to SNF today, CSW following.                  Action/Plan:   Expected Discharge Date:  02/03/15               Expected Discharge Plan:  Skilled Nursing Facility  In-House Referral:  Clinical Social Work  Discharge planning Services  CM Consult  Post Acute Care Choice:    Choice offered to:     DME Arranged:    DME Agency:     HH Arranged:    [place] Agency:     Status of Service:  Completed, signed off  Medicare Important Message Given:  Yes Date Medicare IM Given:    Medicare IM give by:    Date Additional Medicare IM Given:    Additional Medicare Important Message give by:     If discussed at [place] of Stay Meetings, dates discussed:    Additional Comments:  Zenon Mayo, RN 02/04/2015, 11:32 AM

## 2015-02-04 NOTE — Progress Notes (Signed)
Report called to [facility] spoke to [place]

## 2015-02-04 NOTE — Clinical Social Work Placement (Signed)
   CLINICAL SOCIAL WORK PLACEMENT  NOTE  Date:  02/04/2015  Patient Details  Name: [name] MRN: [id number] Date of Birth: August 08, 1929  Clinical Social Work is seeking post-discharge placement for this patient at the [place] level of care (*CSW will initial, date and re-position this form in  chart as items are completed):  Yes   Patient/family provided with [employer] Work Department's list of facilities offering this level of care within the geographic area requested by the patient (or if unable, by the patient's family).  Yes   Patient/family informed of their freedom to choose among providers that offer the needed level of care, that participate in Medicare, Medicaid or managed care program needed by the patient, have an available bed and are willing to accept the patient.  Yes   Patient/family informed of 's ownership interest in [facility] and [facility], as well as of the fact that they are under no obligation to receive care at these facilities.  PASRR submitted to EDS on 01/30/15     PASRR number received on 01/30/15     Existing PASRR number confirmed on       FL2 transmitted to all facilities in geographic area requested by pt/family on 01/30/15     FL2 transmitted to all facilities within larger geographic area on       Patient informed that his/her managed care company has contracts with or will negotiate with certain facilities, including the following:        Yes   Patient/family informed of bed offers received.  Patient chooses bed at [facility]     Physician recommends and patient chooses bed at      Patient to be transferred to [facility] on 02/04/15.  Patient to be transferred to facility by [facility]     Patient family notified on 02/04/15 of transfer.  Name of family member notified:  [name], daughter, [phone]     PHYSICIAN Please prepare prescriptions     Additional Comment:     _______________________________________________ Benard Halsted, [place] 02/04/2015, 12:03 PM

## 2015-02-05 LAB — CULTURE, BLOOD (ROUTINE X 2)
CULTURE: NO GROWTH
Culture: NO GROWTH

## 2015-02-06 ENCOUNTER — Non-Acute Institutional Stay (SKILLED_NURSING_FACILITY): Payer: Medicare Other | Admitting: Internal Medicine

## 2015-02-06 DIAGNOSIS — R1310 Dysphagia, unspecified: Secondary | ICD-10-CM | POA: Diagnosis not present

## 2015-02-06 DIAGNOSIS — N390 Urinary tract infection, site not specified: Secondary | ICD-10-CM

## 2015-02-06 DIAGNOSIS — B9620 Unspecified Escherichia coli [E. coli] as the cause of diseases classified elsewhere: Secondary | ICD-10-CM | POA: Diagnosis not present

## 2015-02-06 DIAGNOSIS — J189 Pneumonia, unspecified organism: Secondary | ICD-10-CM

## 2015-02-06 DIAGNOSIS — K5900 Constipation, unspecified: Secondary | ICD-10-CM | POA: Diagnosis not present

## 2015-02-06 DIAGNOSIS — E785 Hyperlipidemia, unspecified: Secondary | ICD-10-CM | POA: Diagnosis not present

## 2015-02-06 DIAGNOSIS — I5032 Chronic diastolic (congestive) heart failure: Secondary | ICD-10-CM | POA: Diagnosis not present

## 2015-02-06 DIAGNOSIS — R531 Weakness: Secondary | ICD-10-CM | POA: Diagnosis not present

## 2015-02-06 DIAGNOSIS — K219 Gastro-esophageal reflux disease without esophagitis: Secondary | ICD-10-CM
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
## 2015-02-10 ENCOUNTER — Encounter (HOSPITAL_COMMUNITY): Payer: Medicare Other

## 2015-02-17 ENCOUNTER — Telehealth: Payer: Self-pay | Admitting: Hematology & Oncology

## 2015-02-17 NOTE — Telephone Encounter (Signed)
Daughter called and cx 02/19/15 apt due to mother being sick and in a rehab home for a little while.  Daughter stated, her mother will call and resch apt once she is back home

## 2015-02-19 ENCOUNTER — Ambulatory Visit: Payer: Medicare Other | Admitting: Family

## 2015-02-19 ENCOUNTER — Other Ambulatory Visit: Payer: Medicare Other

## 2015-03-04 ENCOUNTER — Encounter: Payer: Self-pay | Admitting: Adult Health

## 2015-03-04 ENCOUNTER — Non-Acute Institutional Stay (SKILLED_NURSING_FACILITY): Payer: Medicare Other | Admitting: Adult Health

## 2015-03-04 DIAGNOSIS — K219 Gastro-esophageal reflux disease without esophagitis: Secondary | ICD-10-CM

## 2015-03-04 DIAGNOSIS — R1310 Dysphagia, unspecified: Secondary | ICD-10-CM | POA: Diagnosis not present

## 2015-03-04 DIAGNOSIS — J189 Pneumonia, unspecified organism: Secondary | ICD-10-CM

## 2015-03-04 DIAGNOSIS — N390 Urinary tract infection, site not specified: Secondary | ICD-10-CM

## 2015-03-04 DIAGNOSIS — I5032 Chronic diastolic (congestive) heart failure: Secondary | ICD-10-CM

## 2015-03-04 DIAGNOSIS — E785 Hyperlipidemia, unspecified: Secondary | ICD-10-CM | POA: Diagnosis not present

## 2015-03-04 DIAGNOSIS — B9620 Unspecified Escherichia coli [E. coli] as the cause of diseases classified elsewhere: Secondary | ICD-10-CM | POA: Diagnosis not present

## 2015-03-04 DIAGNOSIS — K5900 Constipation, unspecified: Secondary | ICD-10-CM | POA: Diagnosis not present

## 2015-03-04 DIAGNOSIS — R531 Weakness: Secondary | ICD-10-CM

## 2015-03-04 DIAGNOSIS — F419 Anxiety disorder, unspecified: Secondary | ICD-10-CM | POA: Diagnosis not present

## 2015-03-04 NOTE — Progress Notes (Signed)
Patient ID: [name], female   DOB: 1929/11/16, 82 y.o.   MRN: [id number]    DATE:  03/04/2015   MRN:  [id number]  BIRTHDAY: 07-09-1929  Facility:  Nursing Home Location:  [place] Room Number: [id number]  LEVEL OF CARE:  SNF [phone])  Contact Information    Name Relation Home Work [place] Daughter [phone]  [phone]   Achante, Sturdy [phone]  [phone]   Tesla, Ruef   [id number]      Code Status History    Date Active Date Inactive Code Status Order ID Comments User Context   01/28/2015  6:30 PM 02/04/2015  9:50 PM Full Code [id number]  Elmarie Shiley, MD Inpatient   08/14/2012 11:09 AM 08/15/2012  1:57 PM Full Code [id number]  Irine Seal, MD Inpatient       Chief Complaint  Patient presents with  . Discharge Note    Generalized weakness, pneumonia, UTI, dysphagia, CHF, constipation, GERD and hyperlipidemia    HISTORY OF PRESENT ILLNESS:  This is an 82 year old female who is for discharge home with home health PT, OT, ST and CNA. DME:  Standard wheelchair with elevating leg rest, cushion, anti-tippers and bedside commode. She has been admitted to [facility] on 02/04/15 from [facility] with acute encephalopathy in setting of community acquired pneumonia and E.coli UTI. She was started on iv antibiotics and switched to po antibiotics at discharge. She has PMH of chronic diastolic chf, afib, HTN, and S/P MVR.  Patient was admitted to this facility for short-term rehabilitation after the patient's recent hospitalization.  Patient has completed SNF rehabilitation and therapy has cleared the patient for discharge.   PAST MEDICAL HISTORY:  Past Medical History  Diagnosis Date  . CHF (congestive heart failure) ([place])   . Edema of both legs 04-23-2013  pt states feet swelling (wears ted hose)    bilateral venous ablation procedures done in IR, L>R  . Hypertension   . Venous insufficiency,  peripheral   . H/O hiatal hernia   . Anxiety   . Depression   . Permanent atrial fibrillation ([place])     CARDIOLOGIST-- DR [id number]  . Sick sinus syndrome ([place])     S/P  PACEMAKER  2009  . S/P mitral valve replacement with bioprosthetic valve     04-03-2009  . Right ureteral stone   . Thrombocytopenia, immune     CHRONIC---  HEMOTOLOGIST---  DR ENNEVER  . Osteopenia   . Benign essential tremor   . Schatzki's ring   . Hyperparathyroidism ([place])     MILD--  STABLE  . Hyperlipidemia   . GERD (gastroesophageal reflux disease)   . Basal cell carcinoma of forehead   . Wears glasses   . Kidney stones   . Presence of permanent cardiac pacemaker     LAST PACER CHECK  04-23-2013  IN EPIC  . CAP (community acquired pneumonia) 01/29/2015  . Pneumonia "several times"  . Arthritis     "mostly my hands" (01/29/2015)     CURRENT MEDICATIONS: Reviewed  Patient's Medications  New Prescriptions   No medications on file  Previous Medications   ALPRAZOLAM (XANAX) 0.5 MG TABLET    Take 0.5 mg by mouth at bedtime.   ASPIRIN EC 81 MG TABLET    Take 81 mg by mouth every morning.   B COMPLEX VITAMINS (VITAMIN B COMPLEX PO)    Take 1 tablet by mouth every morning.  DOCUSATE SODIUM (COLACE) 100 MG CAPSULE    Take 1 capsule (100 mg total) by mouth 2 (two) times daily.   FUROSEMIDE (LASIX) 40 MG TABLET    Take 40 mg by mouth every morning. Reported on 01/28/2015   IPRATROPIUM (ATROVENT) 0.02 % NEBULIZER SOLUTION    Take 2.5 mLs (0.5 mg total) by nebulization 3 (three) times daily.   LEVALBUTEROL (XOPENEX) 0.63 MG/3ML NEBULIZER SOLUTION    Take 3 mLs (0.63 mg total) by nebulization 3 (three) times daily.   METOPROLOL TARTRATE (LOPRESSOR) 25 MG TABLET    Take 12.5 mg by mouth 2 (two) times daily.    MULTIPLE VITAMIN (MULTIVITAMIN WITH MINERALS) TABS    Take 1 tablet by mouth every morning.   NYSTATIN (MYCOSTATIN) POWDER    Apply powder under both breast 2 times daily for two weeks. Stop date 03/06/2015     OMEPRAZOLE (PRILOSEC) 20 MG CAPSULE    Take 20 mg by mouth every morning.    SIMVASTATIN (ZOCOR) 10 MG TABLET    10 mg daily. Take 1 tablet by mouth daily after supper  Modified Medications   No medications on file  Discontinued Medications   No medications on file     No Known Allergies   REVIEW OF SYSTEMS:  GENERAL: no change in appetite, no fatigue, no weight changes, no fever, chills or weakness EYES: Denies change in vision, dry eyes, eye pain, itching or discharge EARS: Denies change in hearing, ringing in ears, or earache NOSE: Denies nasal congestion or epistaxis MOUTH and THROAT: Denies oral discomfort, gingival pain or bleeding, pain from teeth or hoarseness   RESPIRATORY: no cough, SOB, DOE, wheezing, hemoptysis CARDIAC: no chest pain, or palpitations GI: no abdominal pain, diarrhea, constipation, heart burn, nausea or vomiting GU: Denies dysuria, frequency, hematuria, incontinence, or discharge PSYCHIATRIC: Denies feeling of depression or anxiety. No report of hallucinations, insomnia, paranoia, or agitation   PHYSICAL EXAMINATION  GENERAL APPEARANCE: Well nourished. In no acute distress. Normal body habitus HEAD: Normal in size and contour. No evidence of trauma EYES: Lids open and close normally. No blepharitis, entropion or ectropion. PERRL. Conjunctivae are clear and sclerae are white. Lenses are without opacity EARS: Pinnae are normal. Patient hears normal voice tunes of the examiner MOUTH and THROAT: Lips are without lesions. Oral mucosa is moist and without lesions. Tongue is normal in shape, size, and color and without lesions NECK: supple, trachea midline, no neck masses, no thyroid tenderness, no thyromegaly LYMPHATICS: no LAN in the neck, no supraclavicular LAN RESPIRATORY: breathing is even & unlabored, BS CTAB CARDIAC: RRR, no murmur,no extra heart sounds, BLE edema trace, left chest pacemaker GI: abdomen soft, normal BS, no masses, no tenderness, no  hepatomegaly, no splenomegaly EXTREMITIES:  Able to move 4 extremities PSYCHIATRIC: Alert and oriented X 3. Affect and behavior are appropriate  LABS/RADIOLOGY: Labs reviewed: 02/26/15  chest x-ray shows mild patchy left basilar density compatible with pneumonia or atelectasis, mild cardiomegaly, mild osteoporosis and mild degree of osteoarthritis  02/18/15  WBC 4.3 hemoglobin 11.3 hematocrit 36.1 MCV 97.6 platelet 109 sodium 142 potassium 3.9 glucose 117 BUN 17 creatinine 0.90 calcium 9.8 02/17/15  left basilar opacity likely representing infectious process 02/11/15  WBC 6.4 hemoglobin 10.9 hematocrit 34.9 MCV 96.1 platelet [id number] Basic Metabolic Panel:  Recent Labs  02/01/15 0545 02/02/15 0653 02/04/15 0555  NA 138 138 138  K 4.7 4.5 4.5  CL 106 105 106  CO2 25 27 27   GLUCOSE 85 91 104*  BUN 16 14 15   CREATININE 0.96 0.87 0.92  CALCIUM 9.5 9.4 9.9   Liver Function Tests:  Recent Labs  01/28/15 1320  AST 43*  ALT 20  ALKPHOS 43  BILITOT 0.6  PROT 6.2*  ALBUMIN 3.4*    CBC:  Recent Labs  05/21/14 1132 11/19/14 1149 01/28/15 1320  02/01/15 0545 02/02/15 0653 02/04/15 0555  WBC 4.5 5.5 7.0  < > 7.8 6.9 6.1  NEUTROABS 2.4 3.1 4.9  --   --   --   --   HGB 12.3 13.3 13.1  < > 10.4* 11.3* 11.1*  HCT 38.1 41.5 41.7  < > 33.2* 35.8* 34.0*  MCV 96 96 98.3  < > 96.0 95.2 95.0  PLT 98 Platelet count consistent in citrate* 79 Platelet count consistent in citrate, large plts* 81*  < > 84* 104* 128*  < > = values in this interval not displayed.   Dg Swallowing Func-speech Pathology  02/04/2015  Objective Swallowing Evaluation:   Patient Details Name: Latiara Clayton MRN: [id number] Date of Birth: 09-24-1929 Today's Date: 02/04/2015 Time: SLP Start Time (ACUTE ONLY): 1341-SLP Stop Time (ACUTE ONLY): 1358 SLP Time Calculation (min) (ACUTE ONLY): 17 min Past Medical History: Past Medical History Diagnosis Date . CHF (congestive heart failure) ([place])  . Edema of both legs 04-23-2013   pt states feet swelling (wears ted hose)   bilateral venous ablation procedures done in IR, L>R . Hypertension  . Venous insufficiency, peripheral  . H/O hiatal hernia  . Anxiety  . Depression  . Permanent atrial fibrillation ([place])    CARDIOLOGIST-- DR [id number] . Sick sinus syndrome ([place])    S/P  PACEMAKER  2009 . S/P mitral valve replacement with bioprosthetic valve    04-03-2009 . Right ureteral stone  . Thrombocytopenia, immune    CHRONIC---  HEMOTOLOGIST---  DR ENNEVER . Osteopenia  . Benign essential tremor  . Schatzki's ring  . Hyperparathyroidism ([place])    MILD--  STABLE . Hyperlipidemia  . GERD (gastroesophageal reflux disease)  . Basal cell carcinoma of forehead  . Wears glasses  . Kidney stones  . Presence of permanent cardiac pacemaker    LAST PACER CHECK  04-23-2013  IN EPIC . CAP (community acquired pneumonia) 01/29/2015 . Pneumonia "several times" . Arthritis    "mostly my hands" (01/29/2015) Past Surgical History: Past Surgical History Procedure Laterality Date . Hand surgery Right 05/2011   "no fracture; straightened things up . Permanent pacemaker insertion  02/26/2007   Dual Chamber [employer] Programmed VVIR--- Generator [id number]/  [id number] H  . Cystoscopy w/ ureteral stent placement Left 08/01/2012   Procedure: CYSTOSCOPY WITH RETROGRADE PYELOGRAM/URETERAL STENT PLACEMENT;  Surgeon: Dutch Gray, MD;  Location: [facility];  Service: Urology;  Laterality: Left; . Cystoscopy with ureteroscopy Left 08/14/2012   Procedure: LEFT URETEROSCOPY WITH HOLMIUM LASER AND LEFT  STENT PLACEMENT;  Surgeon: Malka So, MD;  Location: [facility];  Service: Urology;  Laterality: Left; . Holmium laser application Left [id number]   Procedure:  HOLMIUM LASER APPLICATION;  Surgeon: Malka So, MD;  Location: [facility];  Service: Urology;  Laterality: Left; . Nm myocar perf wall motion  03/01/1999   mild lateral ischemia . Mitral valve replacement with chordal presevation using bioprosthetic valve/  ligation left atrial appendage/   left-sided maze procedure  04-03-2009  DR Ethel Rana,  SERIAL # [id number] . Transthoracic echocardiogram  09-07-2012  DR CROITORU   MILD LVH/  EF 55-60%/  MILD TO  MODERATE AR/   SEVERE LAE  &  RAE/  MILD TO MODERATE TR/   MITRAL VALVE BIOPROSTHESIS LEAFLET NORMAL,  MODERATE STENOSIS WITH NO SIG. REGURG/  MODERATE RVE . Cardiac catheterization  03/27/2009   normal coronaries; grade IV Mitral Insuff. . Cardiac catheterization  2001  &  2006 . Vaginal hysterectomy  1982 . Extracorporeal shock wave lithotripsy Left 07-26-2012 . Cystoscopy with ureteroscopy and stent placement Right 04/25/2013   Procedure: RIGHT URETEROSCOPY WITH  STENT PLACEMENT;  Surgeon: Irine Seal, MD;  Location: [facility];  Service: Urology;  Laterality: Right; . Holmium laser application Right [id number]   Procedure: HOLMIUM LASER APPLICATION;  Surgeon: Irine Seal, MD;  Location: [facility];  Service: Urology;  Laterality: Right; . Cardiac valve replacement   . Hand surgery Left 2014   "no fracture; straightened things up" . Cataract extraction w/ intraocular lens  implant, bilateral Bilateral ~ 2012 . Esophagogastroduodenoscopy (egd) with esophageal dilation  "a couple times" (01/29/2015) HPI: 82 y.o. female with a PMHx of CHF, HTN, sick sinus syndrome s/p pacemaker, hiatal hernia, Afib, chronic thrombocytopenia, who present to ED with worsening weakness, inability to ambulate. CXR shows LLL PNA. Pt had MBS in 2011 s/p intubation with recommendation for small bites/sips to increase airawy protection with thin liquids. Subjective: pt alert, without complaints Assessment / Plan / Recommendation CHL IP CLINICAL IMPRESSIONS 02/04/2015 Therapy Diagnosis Mild oral phase dysphagia;Mild pharyngeal phase dysphagia;Suspected primary esophageal dysphagia Clinical Impression Pt has a mild oropharyngeal dysphagia that is also likely attributable to esophageal history. Orally, she has reduced bolus cohesion and premature  spillage that is increased with cup sips, suspect due to tremor with self-feeding. She has better oral containment with solids and self-administered trials of liquids by straw sips. Thin liquids are deeply and silently penetrated to the level of the vocal folds both before the swallow from premature spillage as well as after the swallow with residue remaining in the pyfirom sinuses. Given highest volume of residue with thin liquids, suspect that increased sub-UES pressure may be a contributor. Premature spillage of nectar thick liquids by cup are silently penetrated to the vocal folds as well, but she shows good airway protection with straw sips of nectar thick liquids and with solids. Less residue noted as well, decreaseing aspiration risk after the swallow. Recommend Dys 3 diet and nectar thick liquids via straw only. Pt will need additional f/u at SNF. Impact on safety and function Mild aspiration risk   CHL IP TREATMENT RECOMMENDATION 02/04/2015 Treatment Recommendations Therapy as outlined in treatment plan below   Prognosis 02/04/2015 Prognosis for Safe Diet Advancement Fair Barriers to Reach Goals Other (Comment) Barriers/Prognosis Comment -- CHL IP DIET RECOMMENDATION 02/04/2015 SLP Diet Recommendations Dysphagia 3 (Mech soft) solids;Nectar thick liquid Liquid Administration via Straw Medication Administration Whole meds with puree Compensations Slow rate;Small sips/bites;Follow solids with liquid Postural Changes Remain semi-upright after after feeds/meals (Comment);Seated upright at 90 degrees   CHL IP OTHER RECOMMENDATIONS 02/04/2015 Recommended Consults -- Oral Care Recommendations Oral care BID Other Recommendations Order thickener from pharmacy;Prohibited food (jello, ice cream, thin soups);Remove water pitcher   CHL IP FOLLOW UP RECOMMENDATIONS 02/04/2015 Follow up Recommendations Skilled Nursing facility   [facility] IP FREQUENCY AND DURATION 02/04/2015 Speech Therapy Frequency (ACUTE ONLY) min 2x/week  Treatment Duration 2 weeks      CHL IP ORAL PHASE 02/04/2015 Oral Phase Impaired Oral - Pudding Teaspoon -- Oral - Pudding Cup -- Oral - Honey Teaspoon -- Oral - Honey Cup --  Oral - Nectar Teaspoon -- Oral - Nectar Cup Decreased bolus cohesion;Premature spillage Oral - Nectar Straw WFL Oral - Thin Teaspoon -- Oral - Thin Cup Decreased bolus cohesion;Premature spillage Oral - Thin Straw WFL Oral - Puree WFL Oral - Mech Soft WFL Oral - Regular -- Oral - Multi-Consistency -- Oral - Pill -- Oral Phase - Comment --  CHL IP PHARYNGEAL PHASE 02/04/2015 Pharyngeal Phase Impaired Pharyngeal- Pudding Teaspoon -- Pharyngeal -- Pharyngeal- Pudding Cup -- Pharyngeal -- Pharyngeal- Honey Teaspoon -- Pharyngeal -- Pharyngeal- Honey Cup -- Pharyngeal -- Pharyngeal- Nectar Teaspoon -- Pharyngeal -- Pharyngeal- Nectar Cup Delayed swallow initiation-pyriform sinuses;Penetration/Aspiration before swallow;Pharyngeal residue - valleculae Pharyngeal Material enters airway, CONTACTS cords and not ejected out Pharyngeal- Nectar Straw Delayed swallow initiation-vallecula;Pharyngeal residue - valleculae Pharyngeal -- Pharyngeal- Thin Teaspoon -- Pharyngeal -- Pharyngeal- Thin Cup Delayed swallow initiation-pyriform sinuses;Penetration/Aspiration before swallow;Pharyngeal residue - valleculae;Pharyngeal residue - pyriform;Penetration/Apiration after swallow Pharyngeal Material enters airway, CONTACTS cords and not ejected out Pharyngeal- Thin Straw Delayed swallow initiation-pyriform sinuses;Penetration/Apiration after swallow;Compensatory strategies attempted (with notebox) Pharyngeal Material enters airway, remains ABOVE vocal cords and not ejected out Pharyngeal- Puree Delayed swallow initiation-vallecula Pharyngeal -- Pharyngeal- Mechanical Soft Delayed swallow initiation-vallecula Pharyngeal -- Pharyngeal- Regular -- Pharyngeal -- Pharyngeal- Multi-consistency -- Pharyngeal -- Pharyngeal- Pill -- Pharyngeal -- Pharyngeal Comment --  CHL  IP CERVICAL ESOPHAGEAL PHASE 02/04/2015 Cervical Esophageal Phase WFL Pudding Teaspoon -- Pudding Cup -- Honey Teaspoon -- Honey Cup -- Nectar Teaspoon -- Nectar Cup -- Nectar Straw -- Thin Teaspoon -- Thin Cup -- Thin Straw -- Puree -- Mechanical Soft -- Regular -- Multi-consistency -- Pill -- Cervical Esophageal Comment -- Germain Osgood, M.A. CCC-SLP [phone] Germain Osgood 02/04/2015, 2:26 PM               ASSESSMENT/PLAN:  Generalized weakness - for home health PT, OT, ST, CNA and Nursing  CAP - resolved  Escherichia coli UTI - resolved  Dysphagia - will have home health ST; continue dysphagia 3 diet with nectar thick liquid; aspiration precautions  Diastolic CHF - no SOB; continue metoprolol 12.5 mg twice a day and Lasix 40 mg daily  GERD - continue Prilosec 20 mg daily  Constipation continue Colace 100 mg twice a day  Hyperlipidemia - continue Lipitor 10 mg daily  Anxiety - continue Xanax 0.5 mg 1 tab by mouth daily at bedtime      I have filled out patient's discharge paperwork and written prescriptions.  Patient will receive home health PT, OT, ST and Nursing.  Total discharge time: Less than 30 minutes  Discharge time involved coordination of the discharge process with Education officer, museum, nursing staff and therapy department. Medical justification for home health services verified.    [facility], NP [employer] [phone]

## 2015-03-05 ENCOUNTER — Other Ambulatory Visit: Payer: Self-pay | Admitting: *Deleted

## 2015-03-05 MED ORDER — ALPRAZOLAM 0.5 MG PO TABS: 0.5000 mg | ORAL_TABLET | Freq: Every day | ORAL | Status: DC

## 2015-03-27 ENCOUNTER — Other Ambulatory Visit: Payer: Self-pay | Admitting: Adult Health

## 2015-03-30 ENCOUNTER — Other Ambulatory Visit: Payer: Self-pay | Admitting: Adult Health

## 2015-04-03 ENCOUNTER — Other Ambulatory Visit: Payer: Self-pay | Admitting: Adult Health

## 2015-04-21 ENCOUNTER — Telehealth: Payer: Self-pay | Admitting: *Deleted

## 2015-04-21 ENCOUNTER — Other Ambulatory Visit: Payer: Self-pay | Admitting: *Deleted

## 2015-04-21 NOTE — Telephone Encounter (Signed)
[facility] requesting [facility] ref[facility] and 90 supply of amoldipine-olmesartan 5-20 mg daily.  This med is not listed and upon review was discontinued when she was discharged from the hospital 02/04/15.  Upon returning home patient restarted because she felt Dr. Loletha Grayer would want her back on this medication.  Will send to Dr. Loletha Grayer for review and advise on this .  States she has been taking her BP's since being home for the past 3 weeks and they are running around 120/60.

## 2015-04-21 NOTE — Telephone Encounter (Signed)
If her BP is 120/60 on amlod-olmesartan, she should continue taking this mediation. Please refill.

## 2015-04-22 NOTE — Telephone Encounter (Signed)
Notified per Dr. Loletha Grayer continue amlodipine/olmesartan 5-20 mg daily.  Needs appt with Dr. Loletha Grayer in April.  Message sent to scheduling.  Patient voiced understanding.

## 2015-04-23 ENCOUNTER — Telehealth: Payer: Self-pay | Admitting: *Deleted

## 2015-04-23 MED ORDER — AMLODIPINE-OLMESARTAN 5-20 MG PO TABS: 1.0000 | ORAL_TABLET | Freq: Every day | ORAL | Status: DC

## 2015-04-23 NOTE — Telephone Encounter (Signed)
[facility] She stated that she spoke with the nurse yesterday and was told that this would be sent in, but the pharmacy does not have it.

## 2015-04-23 NOTE — Telephone Encounter (Signed)
Documentation reviewed - Refill sent and pt notified.

## 2015-05-02 ENCOUNTER — Other Ambulatory Visit: Payer: Self-pay | Admitting: Adult Health

## 2015-05-20 ENCOUNTER — Encounter: Payer: Self-pay | Admitting: Family

## 2015-05-20 ENCOUNTER — Other Ambulatory Visit (HOSPITAL_BASED_OUTPATIENT_CLINIC_OR_DEPARTMENT_OTHER): Payer: Medicare Other

## 2015-05-20 ENCOUNTER — Ambulatory Visit (HOSPITAL_BASED_OUTPATIENT_CLINIC_OR_DEPARTMENT_OTHER): Payer: Medicare Other | Admitting: Family

## 2015-05-20 VITALS — BP 112/63 | HR 98 | Temp 97.7°F | Resp 16 | Ht 65.0 in | Wt 167.0 lb

## 2015-05-20 DIAGNOSIS — D696 Thrombocytopenia, unspecified: Secondary | ICD-10-CM

## 2015-05-20 LAB — CBC WITH DIFFERENTIAL (CANCER CENTER ONLY)
BASO#: 0 10*3/uL (ref 0.0–0.2)
BASO%: 0.7 % (ref 0.0–2.0)
EOS%: 5.4 % (ref 0.0–7.0)
Eosinophils Absolute: 0.3 10*3/uL (ref 0.0–0.5)
HCT: 39.3 % (ref 34.8–46.6)
HGB: 12.7 g/dL (ref 11.6–15.9)
LYMPH#: 1.8 10*3/uL (ref 0.9–3.3)
LYMPH%: 29.7 % (ref 14.0–48.0)
MCH: 31.1 pg (ref 26.0–34.0)
MCHC: 32.3 g/dL (ref 32.0–36.0)
MCV: 96 fL (ref 81–101)
MONO#: 0.7 10*3/uL (ref 0.1–0.9)
MONO%: 11.9 % (ref 0.0–13.0)
NEUT#: 3.1 10*3/uL (ref 1.5–6.5)
NEUT%: 52.3 % (ref 39.6–80.0)
Platelets: 114 10*3/uL — ABNORMAL LOW (ref 145–400)
RBC: 4.09 10*6/uL (ref 3.70–5.32)
RDW: 13.9 % (ref 11.1–15.7)
WBC: 5.9 10*3/uL (ref 3.9–10.0)

## 2015-05-20 LAB — CHCC SATELLITE - SMEAR

## 2015-05-20 NOTE — Progress Notes (Signed)
Hematology and Oncology Follow Up Visit  [name] [id number] 1929-09-30 82 y.o. 05/20/2015   Principle Diagnosis:  Chronic immune thrombocytopenia-mild  Current Therapy:   Observation    Interim History:  [name] is here today with her husband for a follow-up. She continues to do well and has no complaints at this time. Her platelet count is now up to 114.  No episodes of bleeding or petechiae. She takes 1 baby aspirin daily and does bruise easily.  She was hospitalized with pneumonia in December and then spent 3 weeks at a skilled nursing facility for PT. She is feeling much better now and getting around nicely with a walker.  She had tripped and fallen trying to get to the phone before her hospitalization but has not had any more falls since being back at home.  No fever, chills, n/v, cough, rash, dizziness, headache, blurred vision, SOB, chest pain, palpitations, abdominal pain or changes in bowel or bladder habits.  No swelling, tenderness, numbness or tingling in her extremities.  Her appetite is good and she is staying hydrated. Her weight is stable.  She has had no changes in her medications.   Medications:    Medication List       This list is accurate as of: 05/20/15  2:06 PM.  Always use your most recent med list.               ALPRAZolam 0.5 MG tablet  Commonly known as:  XANAX  TAKE 1 TABLET BY MOUTH AT BEDTIME     amLODipine-olmesartan 5-20 MG tablet  Commonly known as:  AZOR  Take 1 tablet by mouth daily.     aspirin EC 81 MG tablet  Take 81 mg by mouth every morning.     docusate sodium 100 MG capsule  Commonly known as:  COLACE  Take 1 capsule (100 mg total) by mouth 2 (two) times daily.     furosemide 40 MG tablet  Commonly known as:  LASIX  Take 40 mg by mouth every morning. Reported on 01/28/2015     KLOR-CON M20 20 MEQ tablet  Generic drug:  potassium chloride SA  Take 20 mEq by mouth daily.     metoprolol tartrate 25 MG tablet    Commonly known as:  LOPRESSOR  Take 12.5 mg by mouth 2 (two) times daily.     multivitamin with minerals Tabs tablet  Take 1 tablet by mouth every morning.     nystatin powder  Commonly known as:  MYCOSTATIN  Apply powder under both breast 2 times daily for two weeks. Stop date 03/06/2015     omeprazole 20 MG capsule  Commonly known as:  PRILOSEC  Take 20 mg by mouth every morning.     simvastatin 10 MG tablet  Commonly known as:  ZOCOR  TAKE 1 TABLET BY MOUTH EVERY DAY AT DINNER     VITAMIN B COMPLEX PO  Take 1 tablet by mouth every morning.        Allergies: No Known Allergies  Past Medical History, Surgical history, Social history, and Family History were reviewed and updated.  Review of Systems: All other 10 point review of systems is negative.   Physical Exam:  height is 5\' 5"  (1.651 m) and weight is 167 lb (75.751 kg). Her oral temperature is 97.7 F (36.5 C). Her blood pressure is 112/63 and her pulse is 98. Her respiration is 16.   Wt Readings from Last 3 Encounters:  05/20/15  167 lb (75.751 kg)  03/04/15 170 lb 3.2 oz (77.202 kg)  01/28/15 166 lb 12.8 oz (75.66 kg)    Ocular: Sclerae unicteric, pupils equal, round and reactive to light Ear-nose-throat: Oropharynx clear, dentition fair Lymphatic: No cervical supraclavicular or axillary adenopathy Lungs no rales or rhonchi, good excursion bilaterally Heart regular rate and rhythm, no murmur appreciated Abd soft, nontender, positive bowel sounds, no liver or spleen tip palpated on exam, no fluid wave MSK no focal spinal tenderness, no joint edema Neuro: non-focal, well-oriented, appropriate affect Breasts:Deferred  Lab Results  Component Value Date   WBC 5.9 05/20/2015   HGB 12.7 05/20/2015   HCT 39.3 05/20/2015   MCV 96 05/20/2015   PLT 114 Platelet count consistent in citrate* 05/20/2015   No results found for: FERRITIN, IRON, TIBC, UIBC, IRONPCTSAT Lab Results  Component Value Date   RETICCTPCT  1.0 12/13/2010   RBC 4.09 05/20/2015   RETICCTABS 43.4 12/13/2010   No results found for: KPAFRELGTCHN, LAMBDASER, KAPLAMBRATIO No results found for: Osborne Casco Lab Results  Component Value Date   TOTALPROTELP 6.3 11/16/2010   ALBUMINELP 61.0 11/16/2010   A1GS 4.8 11/16/2010   A2GS 10.7 11/16/2010   BETS 7.8* 11/16/2010   BETA2SER 4.8 11/16/2010   GAMS 10.9* 11/16/2010   MSPIKE NOT DET 11/16/2010   SPEI * 11/16/2010     Chemistry      Component Value Date/Time   NA 138 02/04/2015 0555   K 4.5 02/04/2015 0555   CL 106 02/04/2015 0555   CO2 27 02/04/2015 0555   BUN 15 02/04/2015 0555   CREATININE 0.92 02/04/2015 0555   CREATININE 1.61* 11/12/2014 0914      Component Value Date/Time   CALCIUM 9.9 02/04/2015 0555   ALKPHOS 43 01/28/2015 1320   AST 43* 01/28/2015 1320   ALT 20 01/28/2015 1320   BILITOT 0.6 01/28/2015 1320     Impression and Plan: [name] is a 82 year old female with chronic immune thrombocytopenia. So far this has not been an issue for her. She has had no episodes of bleeding. She is doing well and is asymptomatic at this time.  Her platelet count is now up to 114.  We will see her back in 6 months for labs and follow-up.  She will contact us with any questions or concerns. We can certainly see her sooner if need be.    Christina Bottom, NP 4/5/20172:06 PM

## 2015-05-22 ENCOUNTER — Telehealth: Payer: Self-pay | Admitting: Cardiovascular Disease

## 2015-05-22 NOTE — Telephone Encounter (Signed)
Left message for misty, last EF% was 08/2012 and was 55-60%

## 2015-05-22 NOTE — Telephone Encounter (Signed)
She needs Ejecton Fraction and confirmation of diagnosis.Please fax [phone] [place]

## 2015-08-27 ENCOUNTER — Other Ambulatory Visit: Payer: Self-pay

## 2015-08-27 MED ORDER — AMLODIPINE-OLMESARTAN 5-20 MG PO TABS: 1.0000 | ORAL_TABLET | Freq: Every day | ORAL | Status: DC

## 2015-09-28 ENCOUNTER — Ambulatory Visit (INDEPENDENT_AMBULATORY_CARE_PROVIDER_SITE_OTHER): Payer: Medicare Other | Admitting: Cardiovascular Disease

## 2015-09-28 ENCOUNTER — Encounter: Payer: Self-pay | Admitting: Cardiovascular Disease

## 2015-09-28 VITALS — BP 128/73 | HR 70 | Ht 65.0 in | Wt 167.6 lb

## 2015-09-28 DIAGNOSIS — I4821 Permanent atrial fibrillation: Secondary | ICD-10-CM

## 2015-09-28 DIAGNOSIS — I5032 Chronic diastolic (congestive) heart failure: Secondary | ICD-10-CM | POA: Diagnosis not present

## 2015-09-28 DIAGNOSIS — Z950 Presence of cardiac pacemaker: Secondary | ICD-10-CM

## 2015-09-28 DIAGNOSIS — Z79899 Other long term (current) drug therapy: Secondary | ICD-10-CM

## 2015-09-28 DIAGNOSIS — I482 Chronic atrial fibrillation: Secondary | ICD-10-CM

## 2015-09-28 DIAGNOSIS — E785 Hyperlipidemia, unspecified: Secondary | ICD-10-CM | POA: Diagnosis not present

## 2015-09-28 DIAGNOSIS — I10 Essential (primary) hypertension: Secondary | ICD-10-CM

## 2015-09-28 DIAGNOSIS — Z953 Presence of xenogenic heart valve: Secondary | ICD-10-CM

## 2015-09-28 NOTE — Progress Notes (Signed)
Cardiology Office Note    Date:  09/28/2015   ID:  [name], DOB May 15, 1929, MRN [id number]  PCP:   Melinda Crutch, MD  Cardiologist:   Sanda Klein, MD   Chief Complaint  Patient presents with  . Follow-up    patient reports no complaints  Follow-up CHF, atrial fibrillation and pacemaker check  History of Present Illness:  [name] is a 82 y.o. female with long-standing atrial fibrillation with slow ventricular response and  permanent pacemaker, systemic hypertension and hyperlipidemia, chronic diastolic heart failure and a biological mitral valve prosthesis (2011 29 mm Biocor, Dr. Prescott Gum) returning for follow-up.  She has done well since her last appointment, without new episodes of heart failure exacerbation. She has not had serious falls since last December when she had a head contusion. She is not receiving anticoagulation due to frequent falls. She did have left atrial appendage ligation at the time of her mitral valve replacement in 2011, so her risk for stroke is lower. In December she had a prolonged hospitalization for an episode of pneumonia. The only time her pacemaker recorded rapid heart rates was during that hospitalization and the episode of high ventricular response lasted for only 30 minutes.  Her Medtronic Adapta dual-chamber pacemaker was implanted in 2009 but is programmed VVIR for what is now permanent atrial fibrillation. Estimated battery longevity is another 28 months. She has 58% ventricular pacing and 42% ventricular sensed rhythm. The device has recorded a very rare episodes of nonsustained ventricular tachycardia with a maximum duration of 6 seconds. Lead parameters are good.  Past Medical History:  Diagnosis Date  . Anxiety   . Arthritis    "mostly my hands" (01/29/2015)  . Basal cell carcinoma of forehead   . Benign essential tremor   . CAP (community acquired pneumonia) 01/29/2015  . CHF (congestive heart failure) ([place])   . Depression     . Edema of both legs 04-23-2013  pt states feet swelling (wears ted hose)   bilateral venous ablation procedures done in IR, L>R  . GERD (gastroesophageal reflux disease)   . H/O hiatal hernia   . Hyperlipidemia   . Hyperparathyroidism ([place])    MILD--  STABLE  . Hypertension   . Kidney stones   . Osteopenia   . Permanent atrial fibrillation ([facility])    CARDIOLOGIST-- DR [id number]  . Pneumonia "several times"  . Presence of permanent cardiac pacemaker    LAST PACER CHECK  04-23-2013  IN [facility]  . Right ureteral stone   . S/P mitral valve replacement with bioprosthetic valve    04-03-2009  . Schatzki's ring   . Sick sinus syndrome ([place])    S/P  PACEMAKER  2009  . Thrombocytopenia, immune    CHRONIC---  HEMOTOLOGIST---  DR ENNEVER  . Venous insufficiency, peripheral   . Wears glasses     Past Surgical History:  Procedure Laterality Date  . CARDIAC CATHETERIZATION  03/27/2009   normal coronaries; grade IV Mitral Insuff.  Marland Kitchen CARDIAC CATHETERIZATION  2001  &  2006  . CARDIAC VALVE REPLACEMENT    . CATARACT EXTRACTION W/ INTRAOCULAR LENS  IMPLANT, BILATERAL Bilateral ~ 2012  . CYSTOSCOPY W/ URETERAL STENT PLACEMENT Left 08/01/2012   Procedure: CYSTOSCOPY WITH RETROGRADE PYELOGRAM/URETERAL STENT PLACEMENT;  Surgeon: Dutch Gray, MD;  Location: [facility];  Service: Urology;  Laterality: Left;  . CYSTOSCOPY WITH URETEROSCOPY Left 08/14/2012   Procedure: LEFT URETEROSCOPY WITH HOLMIUM LASER AND LEFT  STENT PLACEMENT;  Surgeon:  Malka So, MD;  Location: [facility];  Service: Urology;  Laterality: Left;  . CYSTOSCOPY WITH URETEROSCOPY AND STENT PLACEMENT Right 04/25/2013   Procedure: RIGHT URETEROSCOPY WITH  STENT PLACEMENT;  Surgeon: Irine Seal, MD;  [facility];  Service: Urology;  Laterality: Right;  . ESOPHAGOGASTRODUODENOSCOPY (EGD) WITH ESOPHAGEAL DILATION  "a couple times" (01/29/2015)  . EXTRACORPOREAL SHOCK WAVE LITHOTRIPSY Left 07-26-2012  . HAND SURGERY Right 05/2011    "no fracture; straightened things up  . HAND SURGERY Left 2014   "no fracture; straightened things up"  . HOLMIUM LASER APPLICATION Left [id number]   Procedure:  HOLMIUM LASER APPLICATION;  Surgeon: Malka So, MD;  Location: [facility];  Service: Urology;  Laterality: Left;  . HOLMIUM LASER APPLICATION Right [id number]   Procedure: HOLMIUM LASER APPLICATION;  Surgeon: Irine Seal, MD;  Location: [facility];  Service: Urology;  Laterality: Right;  . MITRAL VALVE REPLACEMENT WITH CHORDAL PRESEVATION USING BIOPROSTHETIC VALVE/  LIGATION LEFT ATRIAL APPENDAGE/  LEFT-SIDED MAZE PROCEDURE  04-03-2009  DR Ethel Rana,  SERIAL # [id number]  . NM MYOCAR PERF WALL MOTION  03/01/1999   mild lateral ischemia  . PERMANENT PACEMAKER INSERTION  02/26/2007   Dual Chamber Medtronic Programmed VVIR--- Generator [id number]/  [id number]   . TRANSTHORACIC ECHOCARDIOGRAM  09-07-2012  DR Regis Hinton   MILD LVH/  EF 55-60%/  MILD TO MODERATE AR/   SEVERE LAE  &  RAE/  MILD TO MODERATE TR/   MITRAL VALVE BIOPROSTHESIS LEAFLET NORMAL,  MODERATE STENOSIS WITH NO SIG. REGURG/  MODERATE RVE  . VAGINAL HYSTERECTOMY  1982    Current Medications: Outpatient Medications Prior to Visit  Medication Sig Dispense Refill  . ALPRAZolam (XANAX) 0.5 MG tablet TAKE 1 TABLET BY MOUTH AT BEDTIME 30 tablet 0  . amLODipine-olmesartan (AZOR) 5-20 MG tablet Take 1 tablet by mouth daily. 90 tablet 0  . aspirin EC 81 MG tablet Take 81 mg by mouth every morning.    . B Complex Vitamins (VITAMIN B COMPLEX PO) Take 1 tablet by mouth every morning.     . docusate sodium (COLACE) 100 MG capsule Take 1 capsule (100 mg total) by mouth 2 (two) times daily. 10 capsule 0  . furosemide (LASIX) 40 MG tablet Take 40 mg by mouth every morning. Reported on 01/28/2015    . KLOR-CON M20 20 MEQ tablet Take 20 mEq by mouth daily.  4  . metoprolol tartrate (LOPRESSOR) 25 MG tablet Take 12.5 mg by mouth 2 (two) times daily.     . Multiple  Vitamin (MULTIVITAMIN WITH MINERALS) TABS Take 1 tablet by mouth every morning.    . nystatin (MYCOSTATIN) powder Apply powder under both breast 2 times daily for two weeks. Stop date 03/06/2015    . omeprazole (PRILOSEC) 20 MG capsule Take 20 mg by mouth every morning.     . simvastatin (ZOCOR) 10 MG tablet TAKE 1 TABLET BY MOUTH EVERY DAY AT DINNER 30 tablet 0   No facility-administered medications prior to visit.      Allergies:   Review of patient's allergies indicates no known allergies.   Social History   Social History  . Marital status: Married    Spouse name: N/A  . Number of children: N/A  . Years of education: N/A   Social History Main Topics  . Smoking status: Never Smoker  . Smokeless tobacco: Never Used     Comment: never used tobacco  . Alcohol  use No  . Drug use: No  . Sexual activity: No   Other Topics Concern  . None   Social History Narrative  . None     Family History:  The patient's family history includes Cancer in her mother; Heart attack in her father.   ROS:   Please see the history of present illness.    ROS All other systems reviewed and are negative.   PHYSICAL EXAM:   VS:  BP 128/73   Pulse 70   Ht 5\' 5"  (1.651 m)   Wt 167 lb 9.6 oz (76 kg)   BMI 27.89 kg/m    GEN: Well nourished, well developed, in no acute distress  HEENT: normal  Neck: no JVD, carotid bruits, or masses Cardiac: irregular; no murmurs, rubs, or gallops,no edema , healthy subclavian pacemaker site Respiratory:  clear to auscultation bilaterally, normal work of breathing GI: soft, nontender, nondistended, + BS MS: no deformity or atrophy  Skin: warm and dry, no rash Neuro:  Alert and Oriented x 3, Strength and sensation are intact Psych: euthymic mood, full affect  Wt Readings from Last 3 Encounters:  09/28/15 167 lb 9.6 oz (76 kg)  05/20/15 167 lb (75.8 kg)  03/04/15 170 lb 3.2 oz (77.2 kg)      Studies/Labs Reviewed:   EKG:  EKG is ordered today.  The ekg  ordered today demonstrates Atrial fibrillation with alternating ventricular sensed/ventricular paced beats  Recent Labs: 01/28/2015: ALT 20; B Natriuretic Peptide 135.9 02/04/2015: BUN 15; Creatinine, Ser 0.92; Potassium 4.5; Sodium 138 05/20/2015: HGB 12.7; Platelets 114 Platelet count consistent in citrate   Lipid Panel No results found for: CHOL, TRIG, HDL, CHOLHDL, VLDL, LDLCALC, LDLDIRECT   ASSESSMENT:    1. Chronic diastolic congestive heart failure ([place])   2. Permanent atrial fibrillation ([place])   3. S/P mitral valve replacement with bioprosthetic valve - 29 mm Biocor   4. Essential hypertension   5. Pacemaker   6. Dyslipidemia   7. Medication management      PLAN:  In order of problems listed above:  1. CHF: Clinically euvolemic, very sedentary so difficult to assess functional status. On a relatively low dose of loop diuretic. No changes recommended 2. AFib: Slow ventricular response and roughly 50% ventricular pacing. She is on a low dose of beta blocker and cutting back on this might help reduce the frequency of ventricular pacing, but she has also had rare episodes of nonsustained VT. No changes made today. Her embolic risk is high due to previous mitral valve replacement, but less due to the fact that she has had appendage clipping. She remains a poor candidate for anticoagulation due to her tendency to fall. 3. S/p MVR: Moderately elevated gradients by previous echocardiography, stable over time. She would not be a candidate for repeat surgery. 4. HTN: Satisfactory control 5. Pacemaker: Normal device function. She does not want to do remote downloads. Will see in the office every 6 months.  6. HLP: Recheck labs  Current medications reviewed at length with the patient today.  Concerns regarding medicines are outlined above.  Medication changes, Labs and Tests ordered today are listed in the Patient Instructions below. Patient Instructions  Dr Sallyanne Kuster recommends that you  continue on your current medications as directed. Please refer to the Current Medication list given to you today.  Your physician recommends that you return for lab work at your earliest [place].  Dr Sallyanne Kuster recommends that you schedule a follow-up appointment in  6 months with a pacemaker check. You will receive a reminder letter in the mail two months in advance. If you don't receive a letter, please call our office to schedule the follow-up appointment.  If you need a refill on your cardiac medications before your next appointment, please call your pharmacy.    Signed, Sanda Klein, MD  09/28/2015 5:36 PM    [facility] [address], [place], [place]  [address] Phone: [phone]; Fax: [phone]

## 2015-09-28 NOTE — Patient Instructions (Addendum)
Dr Sallyanne Kuster recommends that you continue on your current medications as directed. Please refer to the Current Medication list given to you today.  Your physician recommends that you return for lab work at your earliest [place].  Dr Sallyanne Kuster recommends that you schedule a follow-up appointment in 6 months with a pacemaker check. You will receive a reminder letter in the mail two months in advance. If you don't receive a letter, please call our office to schedule the follow-up appointment.  If you need a refill on your cardiac medications before your next appointment, please call your pharmacy.

## 2015-09-29 ENCOUNTER — Telehealth: Payer: Self-pay | Admitting: Cardiovascular Disease

## 2015-09-29 LAB — CUP PACEART INCLINIC DEVICE CHECK
Battery Remaining Longevity: 28 mo
Brady Statistic RV Percent Paced: 58 %
Implantable Lead Implant Date: 20090112
Implantable Lead Implant Date: 20090112
Implantable Lead Location: 753860
Lead Channel Impedance Value: 562 Ohm
Lead Channel Impedance Value: 67 Ohm
Lead Channel Pacing Threshold Amplitude: 1 V
Lead Channel Setting Pacing Amplitude: 2.5 V
MDC IDC LEAD LOCATION: 753859
MDC IDC MSMT BATTERY IMPEDANCE: 2522 Ohm
MDC IDC MSMT BATTERY VOLTAGE: 2.73 V
MDC IDC MSMT LEADCHNL RV PACING THRESHOLD PULSEWIDTH: 0.4 ms
MDC IDC SESS DTM: 20170814173215
MDC IDC SET LEADCHNL RV PACING PULSEWIDTH: 0.4 ms
MDC IDC SET LEADCHNL RV SENSING SENSITIVITY: 2 mV

## 2015-09-29 NOTE — Telephone Encounter (Signed)
Pt called to give medication name:    AMLODIPINE-OLMETAN 5-20mg    pt has been taking this for many years

## 2015-09-29 NOTE — Telephone Encounter (Signed)
Returned call to patient.She stated she called yesterday 09/28/15 but was unable to get a operator to answer.Stated she wanted to let Dr.Croitoru know she is taking Amlodipine/Olmesartan 5/20 mg daily.Message sent to Dr.Croitoru.

## 2015-09-29 NOTE — Telephone Encounter (Signed)
Thank you, we had that down correctly

## 2015-09-29 NOTE — Telephone Encounter (Signed)
Returned call to patient.Dr.Croitoru advised amlodipine/olmesartan entered in chart correctly.Advised to continue as prescribed.

## 2015-10-02 ENCOUNTER — Encounter: Payer: Self-pay | Admitting: Cardiovascular Disease

## 2015-10-02 LAB — COMPREHENSIVE METABOLIC PANEL
ALK PHOS: 46 U/L (ref 33–130)
ALT: 13 U/L (ref 6–29)
AST: 19 U/L (ref 10–35)
Albumin: 3.8 g/dL (ref 3.6–5.1)
BILIRUBIN TOTAL: 0.4 mg/dL (ref 0.2–1.2)
BUN: 22 mg/dL (ref 7–25)
CALCIUM: 10.6 mg/dL — AB (ref 8.6–10.4)
CO2: 26 mmol/L (ref 20–31)
Chloride: 105 mmol/L (ref 98–110)
Creat: 1.27 mg/dL — ABNORMAL HIGH (ref 0.60–0.88)
Glucose, Bld: 91 mg/dL (ref 65–99)
POTASSIUM: 4.8 mmol/L (ref 3.5–5.3)
Sodium: 140 mmol/L (ref 135–146)
TOTAL PROTEIN: 6.1 g/dL (ref 6.1–8.1)

## 2015-10-02 LAB — LIPID PANEL
CHOLESTEROL: 172 mg/dL (ref 125–200)
HDL: 89 mg/dL (ref >=46)
LDL Cholesterol: 69 mg/dL (ref <130)
TRIGLYCERIDES: 72 mg/dL (ref <150)
Total CHOL/HDL Ratio: 1.9 Ratio (ref <=5.0)
VLDL: 14 mg/dL (ref <30)

## 2015-10-12 ENCOUNTER — Telehealth: Payer: Self-pay

## 2015-10-12 DIAGNOSIS — R7989 Other specified abnormal findings of blood chemistry: Secondary | ICD-10-CM

## 2015-10-12 NOTE — Telephone Encounter (Signed)
-----   Message from Sanda Klein, MD sent at 10/02/2015  2:59 PM EDT ----- All labs looked great except mildly elevated creatinine, suggesting she might be a little "dry". However recommend switching her furosemide to an alternating dose of 20 mg on Tuesday Thursday Saturday Sunday and 40 mg on Monday Wednesday Friday. Recheck a basic metabolic panel in 4-6 weeks, please. Lipids are good

## 2015-10-13 NOTE — Telephone Encounter (Signed)
Labs ordered, printed, and mailed to patient.  

## 2015-11-12 LAB — BASIC METABOLIC PANEL
BUN: 23 mg/dL (ref 7–25)
CO2: 28 mmol/L (ref 20–31)
CREATININE: 1.2 mg/dL — AB (ref 0.60–0.88)
Calcium: 10.4 mg/dL (ref 8.6–10.4)
Chloride: 106 mmol/L (ref 98–110)
Glucose, Bld: 86 mg/dL (ref 65–99)
Potassium: 5 mmol/L (ref 3.5–5.3)
Sodium: 140 mmol/L (ref 135–146)

## 2015-11-17 ENCOUNTER — Other Ambulatory Visit: Payer: Self-pay | Admitting: Cardiovascular Disease

## 2015-11-19 ENCOUNTER — Ambulatory Visit (HOSPITAL_BASED_OUTPATIENT_CLINIC_OR_DEPARTMENT_OTHER): Payer: Medicare Other | Admitting: Hematology & Oncology

## 2015-11-19 ENCOUNTER — Encounter: Payer: Self-pay | Admitting: Hematology & Oncology

## 2015-11-19 ENCOUNTER — Other Ambulatory Visit (HOSPITAL_BASED_OUTPATIENT_CLINIC_OR_DEPARTMENT_OTHER): Payer: Medicare Other

## 2015-11-19 ENCOUNTER — Ambulatory Visit (HOSPITAL_BASED_OUTPATIENT_CLINIC_OR_DEPARTMENT_OTHER): Payer: Medicare Other

## 2015-11-19 VITALS — BP 149/49 | HR 72 | Temp 97.8°F | Resp 16 | Ht 65.0 in | Wt 170.0 lb

## 2015-11-19 DIAGNOSIS — Z23 Encounter for immunization: Secondary | ICD-10-CM

## 2015-11-19 DIAGNOSIS — D696 Thrombocytopenia, unspecified: Secondary | ICD-10-CM

## 2015-11-19 LAB — CBC WITH DIFFERENTIAL (CANCER CENTER ONLY)
BASO#: 0.1 10*3/uL (ref 0.0–0.2)
BASO%: 1.4 % (ref 0.0–2.0)
EOS ABS: 0.2 10*3/uL (ref 0.0–0.5)
EOS%: 3.3 % (ref 0.0–7.0)
HCT: 39.1 % (ref 34.8–46.6)
HEMOGLOBIN: 12.5 g/dL (ref 11.6–15.9)
LYMPH#: 1.6 10*3/uL (ref 0.9–3.3)
LYMPH%: 30.9 % (ref 14.0–48.0)
MCH: 30.8 pg (ref 26.0–34.0)
MCHC: 32 g/dL (ref 32.0–36.0)
MCV: 96 fL (ref 81–101)
MONO#: 0.7 10*3/uL (ref 0.1–0.9)
MONO%: 13.5 % — AB (ref 0.0–13.0)
NEUT%: 50.9 % (ref 39.6–80.0)
NEUTROS ABS: 2.6 10*3/uL (ref 1.5–6.5)
RBC: 4.06 10*6/uL (ref 3.70–5.32)
RDW: 15.3 % (ref 11.1–15.7)
WBC: 5.1 10*3/uL (ref 3.9–10.0)

## 2015-11-19 LAB — CHCC SATELLITE - SMEAR

## 2015-11-19 MED ORDER — INFLUENZA VAC SPLIT QUAD 0.5 ML IM SUSY
0.5000 mL | PREFILLED_SYRINGE | Freq: Once | INTRAMUSCULAR | Status: AC
  Administered 2015-11-19: 0.5 mL via INTRAMUSCULAR
  Filled 2015-11-19: qty 0.5

## 2015-11-19 NOTE — Progress Notes (Signed)
Hematology and Oncology Follow Up Visit  Christina Raspanti [id number] [birth year] 82 y.o. 11/19/2015   Principle Diagnosis:   Chronic immune thrombocytopenia-mild  Current Therapy:    Observation     Interim History:  [name] is back for followup. She looks quite good. She has had no problems since we last saw her. Her right hand appears to be doing better. She has surgery on his hand because of severe rheumatoid arthritis. She does not want surgery on the left hand.  She's had no bleeding or bruising. She has had no problems with cough or shortness of breath. She has had no problems with infections. There's been no rashes. She's had no leg swelling.   He told that her kidneys are little bit "dry". She is on diuretics. I think her family doctor is adjusting this.   She has had no problems with pain.   Overall, her performance status is ECOG 2.   Medications:  Current Outpatient Prescriptions:  .  ALPRAZolam (XANAX) 0.5 MG tablet, TAKE 1 TABLET BY MOUTH AT BEDTIME, Disp: 30 tablet, Rfl: 0 .  amLODipine-olmesartan (AZOR) 5-20 MG tablet, TAKE 1 TABLET BY MOUTH EVERY DAY, Disp: 90 tablet, Rfl: 3 .  aspirin EC 81 MG tablet, Take 81 mg by mouth every morning., Disp: , Rfl:  .  B Complex Vitamins (VITAMIN B COMPLEX PO), Take 1 tablet by mouth every morning. , Disp: , Rfl:  .  cholecalciferol (VITAMIN D) 1000 units tablet, Take 2,000 Units by mouth daily., Disp: , Rfl:  .  docusate sodium (COLACE) 100 MG capsule, Take 1 capsule (100 mg total) by mouth 2 (two) times daily., Disp: 10 capsule, Rfl: 0 .  furosemide (LASIX) 40 MG tablet, Take 40 mg by mouth every morning. Reported on 01/28/2015, Disp: , Rfl:  .  KLOR-CON M20 20 MEQ tablet, Take 20 mEq by mouth daily., Disp: , Rfl: 4 .  metoprolol tartrate (LOPRESSOR) 25 MG tablet, Take 12.5 mg by mouth 2 (two) times daily. , Disp: , Rfl:  .  Multiple Vitamin (MULTIVITAMIN WITH MINERALS) TABS, Take 1 tablet by mouth every morning., Disp: ,  Rfl:  .  omeprazole (PRILOSEC) 20 MG capsule, Take 20 mg by mouth every morning. , Disp: , Rfl:  .  OVER THE COUNTER MEDICATION, Nutrifuron (immune supplement) - take 1 tablet/capsule by mouth once daily., Disp: , Rfl:  .  Probiotic CAPS, Take 1 capsule by mouth daily., Disp: , Rfl:  .  simvastatin (ZOCOR) 10 MG tablet, TAKE 1 TABLET BY MOUTH EVERY DAY AT DINNER, Disp: 30 tablet, Rfl: 0  Current Facility-Administered Medications:  .  Influenza vac split quadrivalent PF (FLUARIX) injection 0.5 mL, 0.5 mL, Intramuscular, Once, Volanda Napoleon, MD  Allergies: No Known Allergies  Past Medical History, Surgical history, Social history, and Family History were reviewed and updated.  Review of Systems: As above  Physical Exam:  height is 5\' 5"  (1.651 m) and weight is 170 lb (77.1 kg). Her oral temperature is 97.8 F (36.6 C). Her blood pressure is 149/49 (abnormal) and her pulse is 72. Her respiration is 16.   Elderly white female. Head and neck exam shows no ocular or oral lesions. She has no palpable cervical or supraclavicular lymph nodes. Lungs are clear. Cardiac exam regular rate and rhythm with no murmurs, rubs or bruits. Abdomen is soft. She has good bowel sounds. There is no fluid wave. There is no palpable liver or spleen tip. Back exam shows no tenderness  over the spine, ribs or hips. Extremities shows significant rheumatoid arthritic changes. Skin exam shows no rashes, ecchymosis or petechia.Neurological exam shows no focal deficits.  Lab Results  Component Value Date   WBC 5.1 11/19/2015   HGB 12.5 11/19/2015   HCT 39.1 11/19/2015   MCV 96 11/19/2015   PLT (L) 11/19/2015    99 Platelet count consistent in citrate, Large platelets     Chemistry      Component Value Date/Time   NA 140 11/11/2015 1102   K 5.0 11/11/2015 1102   CL 106 11/11/2015 1102   CO2 28 11/11/2015 1102   BUN 23 11/11/2015 1102   CREATININE 1.20 (H) 11/11/2015 1102      Component Value Date/Time    CALCIUM 10.4 11/11/2015 1102   ALKPHOS 46 10/02/2015 1044   AST 19 10/02/2015 1044   ALT 13 10/02/2015 1044   BILITOT 0.4 10/02/2015 1044         Impression and Plan: [name] is a 82 year old female. She has mild, non- clinical thrombocytopenia. She has never had a problem with this.  Her platelet count is a little bit lower. However, this will not be a problem for her.   I will coordinate her appointments with her husband. We will get her back in 4 months.      Volanda Napoleon, MD 10/5/201712:22 PM

## 2015-11-19 NOTE — Patient Instructions (Signed)

## 2016-03-24 ENCOUNTER — Other Ambulatory Visit (HOSPITAL_BASED_OUTPATIENT_CLINIC_OR_DEPARTMENT_OTHER): Payer: Medicare Other

## 2016-03-24 ENCOUNTER — Ambulatory Visit (HOSPITAL_BASED_OUTPATIENT_CLINIC_OR_DEPARTMENT_OTHER): Payer: Medicare Other | Admitting: Family

## 2016-03-24 VITALS — BP 136/73 | HR 79 | Temp 97.8°F | Resp 18 | Wt 173.5 lb

## 2016-03-24 DIAGNOSIS — D696 Thrombocytopenia, unspecified: Secondary | ICD-10-CM

## 2016-03-24 DIAGNOSIS — Z23 Encounter for immunization: Secondary | ICD-10-CM

## 2016-03-24 LAB — CBC WITH DIFFERENTIAL (CANCER CENTER ONLY)
BASO#: 0 10*3/uL (ref 0.0–0.2)
BASO%: 1 % (ref 0.0–2.0)
EOS ABS: 0.1 10*3/uL (ref 0.0–0.5)
EOS%: 3.5 % (ref 0.0–7.0)
HEMATOCRIT: 38.6 % (ref 34.8–46.6)
HEMOGLOBIN: 12.5 g/dL (ref 11.6–15.9)
LYMPH#: 1.2 10*3/uL (ref 0.9–3.3)
LYMPH%: 29.1 % (ref 14.0–48.0)
MCH: 31.3 pg (ref 26.0–34.0)
MCHC: 32.4 g/dL (ref 32.0–36.0)
MCV: 97 fL (ref 81–101)
MONO#: 0.5 10*3/uL (ref 0.1–0.9)
MONO%: 12.1 % (ref 0.0–13.0)
NEUT%: 54.3 % (ref 39.6–80.0)
NEUTROS ABS: 2.2 10*3/uL (ref 1.5–6.5)
Platelets: 92 10*3/uL — ABNORMAL LOW (ref 145–400)
RBC: 3.99 10*6/uL (ref 3.70–5.32)
RDW: 14.4 % (ref 11.1–15.7)
WBC: 4.1 10*3/uL (ref 3.9–10.0)

## 2016-03-24 LAB — CHCC SATELLITE - SMEAR

## 2016-03-24 NOTE — Progress Notes (Signed)
Hematology and Oncology Follow Up Visit  Christina Lucca [id number] [birth year] 83 y.o. 03/24/2016   Principle Diagnosis:  Chronic immune thrombocytopenia-mild  Current Therapy:   Observation    Interim History:  [name] is here today with her husband for a follow-up. She is doing well and has no complaints at this time. Her Hgb is stable at 12.5 with an MCV of 97. Platelet count is 92. She denies having any episodes of bleeding or petechial rashes. She takes 1 baby aspirin daily and does bruise easily.  She states that she follows up with cardiology next week to have her pacemaker, which has been in place for almost 11 years, evaluated for possible replacement.  No fever, chills, n/v, cough, rash, dizziness, headache, blurred vision, SOB, chest pain, palpitations, abdominal pain or changes in bowel or bladder habits. She has occasional constipation and takes Colace as needed.  No swelling, tenderness, numbness or tingling in her extremities. She uses her walker when ambulating and has had no falls or syncopal episodes. She and her husband participate in a church walking group 3 times week which they really enjoy.  She has maintained a good appetite and is staying well hydrated. Her weight is stable.   Medications:  Allergies as of 03/24/2016   No Known Allergies     Medication List       Accurate as of 03/24/16 11:46 AM. Always use your most recent med list.          ALPRAZolam 0.5 MG tablet Commonly known as:  XANAX TAKE 1 TABLET BY MOUTH AT BEDTIME   amLODipine-olmesartan 5-20 MG tablet Commonly known as:  AZOR TAKE 1 TABLET BY MOUTH EVERY DAY   aspirin EC 81 MG tablet Take 81 mg by mouth every morning.   cholecalciferol 1000 units tablet Commonly known as:  VITAMIN D Take 2,000 Units by mouth daily.   docusate sodium 100 MG capsule Commonly known as:  COLACE Take 1 capsule (100 mg total) by mouth 2 (two) times daily.   furosemide 40 MG tablet Commonly known as:   LASIX Take 40 mg by mouth every morning. Reported on 01/28/2015   KLOR-CON M20 20 MEQ tablet Generic drug:  potassium chloride SA Take 20 mEq by mouth daily.   metoprolol tartrate 25 MG tablet Commonly known as:  LOPRESSOR Take 12.5 mg by mouth 2 (two) times daily.   multivitamin with minerals Tabs tablet Take 1 tablet by mouth every morning.   omeprazole 20 MG capsule Commonly known as:  PRILOSEC Take 20 mg by mouth every morning.   OVER THE COUNTER MEDICATION Nutrifuron (immune supplement) - take 1 tablet/capsule by mouth once daily.   Probiotic Caps Take 1 capsule by mouth daily.   simvastatin 10 MG tablet Commonly known as:  ZOCOR TAKE 1 TABLET BY MOUTH EVERY DAY AT DINNER   VITAMIN B COMPLEX PO Take 1 tablet by mouth every morning.       Allergies: No Known Allergies  Past Medical History, Surgical history, Social history, and Family History were reviewed and updated.  Review of Systems: All other 10 point review of systems is negative.   Physical Exam:  vitals were not taken for this visit.  Wt Readings from Last 3 Encounters:  11/19/15 170 lb (77.1 kg)  09/28/15 167 lb 9.6 oz (76 kg)  05/20/15 167 lb (75.8 kg)    Ocular: Sclerae unicteric, pupils equal, round and reactive to light Ear-nose-throat: Oropharynx clear, dentition fair Lymphatic: No cervical supraclavicular  or axillary adenopathy Lungs no rales or rhonchi, good excursion bilaterally Heart regular rate and rhythm, no murmur appreciated Abd soft, nontender, positive bowel sounds, no liver or spleen tip palpated on exam, no fluid wave MSK no focal spinal tenderness, no joint edema Neuro: non-focal, well-oriented, appropriate affect Breasts:Deferred  Lab Results  Component Value Date   WBC 4.1 03/24/2016   HGB 12.5 03/24/2016   HCT 38.6 03/24/2016   MCV 97 03/24/2016   PLT 92 Platelet count consistent in citrate (L) 03/24/2016   No results found for: FERRITIN, IRON, TIBC, UIBC,  IRONPCTSAT Lab Results  Component Value Date   RETICCTPCT 1.0 12/13/2010   RBC 3.99 03/24/2016   RETICCTABS 43.4 12/13/2010   No results found for: KPAFRELGTCHN, LAMBDASER, KAPLAMBRATIO No results found for: Osborne Casco Lab Results  Component Value Date   TOTALPROTELP 6.3 11/16/2010   ALBUMINELP 61.0 11/16/2010   A1GS 4.8 11/16/2010   A2GS 10.7 11/16/2010   BETS 7.8 (H) 11/16/2010   BETA2SER 4.8 11/16/2010   GAMS 10.9 (L) 11/16/2010   MSPIKE NOT DET 11/16/2010   SPEI * 11/16/2010     Chemistry      Component Value Date/Time   NA 140 11/11/2015 1102   K 5.0 11/11/2015 1102   CL 106 11/11/2015 1102   CO2 28 11/11/2015 1102   BUN 23 11/11/2015 1102   CREATININE 1.20 (H) 11/11/2015 1102      Component Value Date/Time   CALCIUM 10.4 11/11/2015 1102   ALKPHOS 46 10/02/2015 1044   AST 19 10/02/2015 1044   ALT 13 10/02/2015 1044   BILITOT 0.4 10/02/2015 1044     Impression and Plan: [name] is a 83 yo female with chronic immune thrombocytopenia. So far this has not been an issue for her. Her platelet count remains stable at 92 with no episodes of bleeding. No anemia. She continues to do well and has no complaints at this time.  We will continues to follow along with her and plan to see her back in 6 months for repeat lab work and follow-up.  She will contact our office with any questions or concerns. We can certainly see her sooner if need be.    Eliezer Bottom, NP 2/8/201811:46 AM

## 2016-03-30 ENCOUNTER — Encounter: Payer: Self-pay | Admitting: Cardiovascular Disease

## 2016-03-30 ENCOUNTER — Ambulatory Visit (INDEPENDENT_AMBULATORY_CARE_PROVIDER_SITE_OTHER): Payer: Medicare Other | Admitting: Cardiovascular Disease

## 2016-03-30 VITALS — BP 130/70 | HR 72 | Ht 65.0 in | Wt 172.0 lb

## 2016-03-30 DIAGNOSIS — I5032 Chronic diastolic (congestive) heart failure: Secondary | ICD-10-CM | POA: Diagnosis not present

## 2016-03-30 DIAGNOSIS — Z950 Presence of cardiac pacemaker: Secondary | ICD-10-CM | POA: Diagnosis not present

## 2016-03-30 DIAGNOSIS — Z953 Presence of xenogenic heart valve: Secondary | ICD-10-CM | POA: Diagnosis not present

## 2016-03-30 DIAGNOSIS — I10 Essential (primary) hypertension: Secondary | ICD-10-CM | POA: Diagnosis not present

## 2016-03-30 DIAGNOSIS — I482 Chronic atrial fibrillation: Secondary | ICD-10-CM | POA: Diagnosis not present

## 2016-03-30 DIAGNOSIS — I4821 Permanent atrial fibrillation: Secondary | ICD-10-CM

## 2016-03-30 LAB — CUP PACEART INCLINIC DEVICE CHECK
Brady Statistic RV Percent Paced: 62 %
Date Time Interrogation Session: 20180214160232
Implantable Lead Implant Date: 20090112
Implantable Lead Implant Date: 20090112
Implantable Lead Location: 753859
Implantable Lead Model: 4092
Implantable Pulse Generator Implant Date: 20090112
Lead Channel Pacing Threshold Amplitude: 0.875 V
Lead Channel Pacing Threshold Pulse Width: 0.4 ms
Lead Channel Pacing Threshold Pulse Width: 0.4 ms
Lead Channel Setting Pacing Pulse Width: 0.4 ms
Lead Channel Setting Sensing Sensitivity: 2 mV
MDC IDC LEAD LOCATION: 753860
MDC IDC MSMT BATTERY IMPEDANCE: 2810 Ohm
MDC IDC MSMT BATTERY REMAINING LONGEVITY: 25 mo
MDC IDC MSMT BATTERY VOLTAGE: 2.73 V
MDC IDC MSMT LEADCHNL RA IMPEDANCE VALUE: 67 Ohm
MDC IDC MSMT LEADCHNL RV IMPEDANCE VALUE: 575 Ohm
MDC IDC MSMT LEADCHNL RV PACING THRESHOLD AMPLITUDE: 0.75 V
MDC IDC MSMT LEADCHNL RV SENSING INTR AMPL: 8 mV
MDC IDC SET LEADCHNL RV PACING AMPLITUDE: 2.5 V

## 2016-03-30 NOTE — Patient Instructions (Signed)
Dr Sallyanne Kuster recommends that you continue on your current medications as directed. Please refer to the Current Medication list given to you today.  Remote monitoring is used to monitor your Pacemaker of ICD from home. This monitoring reduces the number of office visits required to check your device to one time per year. It allows [place] to keep an eye on the functioning of your device to ensure it is working properly. You are scheduled for a device check from home on Wednesday, May 16th, 2018. You may send your transmission at any time that day. If you have a wireless device, the transmission will be sent automatically. After your physician reviews your transmission, you will receive a postcard with your next transmission date.  Dr Sallyanne Kuster recommends that you schedule a follow-up appointment in 6 months with a pacemaker check. You will receive a reminder letter in the mail two months in advance. If you don't receive a letter, please call our office to schedule the follow-up appointment.  If you need a refill on your cardiac medications before your next appointment, please call your pharmacy.

## 2016-03-30 NOTE — Progress Notes (Signed)
Cardiology Office Note    Date:  03/30/2016   ID:  [name], DOB 11/09/1929, MRN [id number]  PCP:  Melinda Crutch, MD  Cardiologist:   Sanda Klein, MD   Chief Complaint  Patient presents with  . Follow-up  Follow-up CHF, atrial fibrillation and pacemaker check  History of Present Illness:  [name] is a 83 y.o. female with long-standing atrial fibrillation with slow ventricular response and  permanent pacemaker, systemic hypertension and hyperlipidemia, chronic diastolic heart failure and a biological mitral valve prosthesis (2011 29 mm Biocor, Dr. Prescott Gum) returning for follow-up.  She has done well since her last appointment, without new episodes of heart failure exacerbation. She has not had serious falls In over a year now, but had a serious fall last December 2016. She is not receiving anticoagulation due to frequent falls. She did have left atrial appendage ligation at the time of her mitral valve replacement in 2011, so her risk for stroke is lower.   Her pacemaker has generally shown well controlled atrial fibrillation. She did have one 30 minute episode of relatively organized, sustained tachycardia 187 bpm that probably represents atrial flutter with 2:1 AV block. Cannot entirely exclude ventricular tachycardia. She does have occasional bursts of nonsustained VT that generally last for 4-8 seconds, with a distinct electrogram.  Her Medtronic Adapta dual-chamber pacemaker was implanted in 2009 but is programmed VVIR for what is now permanent atrial fibrillation. Estimated battery longevity is another 7-43 months. She has 62% ventricular pacing and 38% ventricular sensed rhythm. Ventricular lead parameters are good.  Past Medical History:  Diagnosis Date  . Anxiety   . Arthritis    "mostly my hands" (01/29/2015)  . Basal cell carcinoma of forehead   . Benign essential tremor   . CAP (community acquired pneumonia) 01/29/2015  . CHF (congestive heart failure)  ([place])   . Depression   . Edema of both legs 04-23-2013  pt states feet swelling (wears ted hose)   bilateral venous ablation procedures done in IR, L>R  . GERD (gastroesophageal reflux disease)   . H/O hiatal hernia   . Hyperlipidemia   . Hyperparathyroidism ([place])    MILD--  STABLE  . Hypertension   . Kidney stones   . Osteopenia   . Permanent atrial fibrillation ([place])    CARDIOLOGIST-- DR NO:[id number]  . Pneumonia "several times"  . Presence of permanent cardiac pacemaker    LAST PACER CHECK  04-23-2013  IN [facility]  . Right ureteral stone   . S/P mitral valve replacement with bioprosthetic valve    04-03-2009  . Schatzki's ring   . Sick sinus syndrome ([place])    S/P  PACEMAKER  2009  . Thrombocytopenia, immune ([place])    CHRONIC---  HEMOTOLOGIST---  DR ENNEVER  . Venous insufficiency, peripheral   . Wears glasses     Past Surgical History:  Procedure Laterality Date  . CARDIAC CATHETERIZATION  03/27/2009   normal coronaries; grade IV Mitral Insuff.  Marland Kitchen CARDIAC CATHETERIZATION  2001  &  2006  . CARDIAC VALVE REPLACEMENT    . CATARACT EXTRACTION W/ INTRAOCULAR LENS  IMPLANT, BILATERAL Bilateral ~ 2012  . CYSTOSCOPY W/ URETERAL STENT PLACEMENT Left 08/01/2012   Procedure: CYSTOSCOPY WITH RETROGRADE PYELOGRAM/URETERAL STENT PLACEMENT;  Surgeon: Dutch Gray, MD;  Location: [facility];  Service: Urology;  Laterality: Left;  . CYSTOSCOPY WITH URETEROSCOPY Left 08/14/2012   Procedure: LEFT URETEROSCOPY WITH HOLMIUM LASER AND LEFT  STENT PLACEMENT;  Surgeon: Marshall Cork  Jeffie Pollock, MD;  Location: [facility];  Service: Urology;  Laterality: Left;  . CYSTOSCOPY WITH URETEROSCOPY AND STENT PLACEMENT Right 04/25/2013   Procedure: RIGHT URETEROSCOPY WITH  STENT PLACEMENT;  Surgeon: Irine Seal, MD;  Location: [facility];  Service: Urology;  Laterality: Right;  . ESOPHAGOGASTRODUODENOSCOPY (EGD) WITH ESOPHAGEAL DILATION  "a couple times" (01/29/2015)  . EXTRACORPOREAL SHOCK WAVE LITHOTRIPSY Left 07-26-2012    . HAND SURGERY Right 05/2011   "no fracture; straightened things up  . HAND SURGERY Left 2014   "no fracture; straightened things up"  . HOLMIUM LASER APPLICATION Left [id number]   Procedure:  HOLMIUM LASER APPLICATION;  Surgeon: Malka So, MD;  Location: [facility];  Service: Urology;  Laterality: Left;  . HOLMIUM LASER APPLICATION Right [id number]   Procedure: HOLMIUM LASER APPLICATION;  Surgeon: Irine Seal, MD;  Location: [facility];  Service: Urology;  Laterality: Right;  . MITRAL VALVE REPLACEMENT WITH CHORDAL PRESEVATION USING BIOPROSTHETIC VALVE/  LIGATION LEFT ATRIAL APPENDAGE/  LEFT-SIDED MAZE PROCEDURE  04-03-2009  DR Ethel Rana,  SERIAL # [id number]  . NM MYOCAR PERF WALL MOTION  03/01/1999   mild lateral ischemia  . PERMANENT PACEMAKER INSERTION  02/26/2007   Dual Chamber Medtronic Programmed VVIR--- Generator [id number]/  [id number]   . TRANSTHORACIC ECHOCARDIOGRAM  09-07-2012  DR Jeyli Zwicker   MILD LVH/  EF 55-60%/  MILD TO MODERATE AR/   SEVERE LAE  &  RAE/  MILD TO MODERATE TR/   MITRAL VALVE BIOPROSTHESIS LEAFLET NORMAL,  MODERATE STENOSIS WITH NO SIG. REGURG/  MODERATE RVE  . VAGINAL HYSTERECTOMY  1982    Current Medications: Outpatient Medications Prior to Visit  Medication Sig Dispense Refill  . ALPRAZolam (XANAX) 0.5 MG tablet TAKE 1 TABLET BY MOUTH AT BEDTIME 30 tablet 0  . amLODipine-olmesartan (AZOR) 5-20 MG tablet TAKE 1 TABLET BY MOUTH EVERY DAY 90 tablet 3  . aspirin EC 81 MG tablet Take 81 mg by mouth every morning.    . B Complex Vitamins (VITAMIN B COMPLEX PO) Take 1 tablet by mouth every morning.     . cholecalciferol (VITAMIN D) 1000 units tablet Take 2,000 Units by mouth daily.    Marland Kitchen docusate sodium (COLACE) 100 MG capsule Take 1 capsule (100 mg total) by mouth 2 (two) times daily. 10 capsule 0  . furosemide (LASIX) 40 MG tablet Take 40 mg by mouth every morning. Reported on 01/28/2015    . KLOR-CON M20 20 MEQ tablet Take 20 mEq by mouth  daily.  4  . metoprolol tartrate (LOPRESSOR) 25 MG tablet Take 12.5 mg by mouth 2 (two) times daily.     . Multiple Vitamin (MULTIVITAMIN WITH MINERALS) TABS Take 1 tablet by mouth every morning.    Marland Kitchen omeprazole (PRILOSEC) 20 MG capsule Take 20 mg by mouth every morning.     Marland Kitchen OVER THE COUNTER MEDICATION Nutrifuron (immune supplement) - take 1 tablet/capsule by mouth once daily.    . Probiotic CAPS Take 1 capsule by mouth daily.    . simvastatin (ZOCOR) 10 MG tablet TAKE 1 TABLET BY MOUTH EVERY DAY AT DINNER 30 tablet 0   No facility-administered medications prior to visit.      Allergies:   Patient has no known allergies.   Social History   Social History  . Marital status: Married    Spouse name: N/A  . Number of children: N/A  . Years of education: N/A   Social History Main Topics  .  Smoking status: Never Smoker  . Smokeless tobacco: Never Used     Comment: never used tobacco  . Alcohol use No  . Drug use: No  . Sexual activity: No   Other Topics Concern  . None   Social History Narrative  . None     Family History:  The patient's family history includes Cancer in her mother; Heart attack in her father.   ROS:   Please see the history of present illness.    ROS All other systems reviewed and are negative.   PHYSICAL EXAM:   VS:  BP 130/70 (BP Location: Right Arm, Patient Position: Sitting, Cuff Size: Normal)   Pulse 72   Ht 5\' 5"  (1.651 m)   Wt 78 kg (172 lb)   BMI 28.62 kg/m    GEN: Well nourished, well developed, in no acute distress  HEENT: normal  Neck: no JVD, carotid bruits, or masses Cardiac: irregular; no murmurs, rubs, or gallops,no edema , healthy subclavian pacemaker site Respiratory:  clear to auscultation bilaterally, normal work of breathing GI: soft, nontender, nondistended, + BS MS: no deformity or atrophy  Skin: warm and dry, no rash Neuro:  Alert and Oriented x 3, Strength and sensation are intact Psych: euthymic mood, full affect  Wt  Readings from Last 3 Encounters:  03/30/16 78 kg (172 lb)  03/24/16 78.7 kg (173 lb 8 oz)  11/19/15 77.1 kg (170 lb)      Studies/Labs Reviewed:   EKG:  EKG is ordered today.  The ekg ordered today demonstrates Atrial fibrillation with alternating ventricular sensed/ventricular paced beats. QTC 477 ms  Recent Labs: 10/02/2015: ALT 13 11/11/2015: BUN 23; Creat 1.20; Potassium 5.0; Sodium 140 03/24/2016: HGB 12.5; Platelets 92 Platelet count consistent in citrate   Lipid Panel    Component Value Date/Time   CHOL 172 10/02/2015 1044   TRIG 72 10/02/2015 1044   HDL 89 10/02/2015 1044   CHOLHDL 1.9 10/02/2015 1044   VLDL 14 10/02/2015 1044   LDLCALC 69 10/02/2015 1044     ASSESSMENT:    1. Chronic diastolic congestive heart failure ([place])   2. Permanent atrial fibrillation ([place])   3. S/P mitral valve replacement with bioprosthetic valve - 29 mm Biocor   4. Essential hypertension   5. Pacemaker      PLAN:  In order of problems listed above:  1. CHF: Clinically euvolemic, very sedentary so difficult to assess functional status. On a relatively low dose of loop diuretic. No changes recommended 2. AFib: Slow ventricular response and roughly 60% ventricular pacing. She is on a low dose of beta blocker and cutting back on this might help reduce the frequency of ventricular pacing, but she has also had rare episodes of nonsustained VT. No changes made today. Her embolic risk is high due to previous mitral valve replacement, but less due to the fact that she has had appendage clipping. She remains a poor candidate for anticoagulation due to her frailty and tendency to fall. 3. S/p MVR: Moderately elevated gradients by previous echocardiography, stable over time. She would not be a candidate for repeat surgery. 4. HTN: Satisfactory control 5. Pacemaker: Normal device function. Encouraged her to try to do remote downloads, which will make device monitoring more convenient as she approaches  ERI. Will see in the office every 6 months until we can be confident about the remote downloads.    Current medications reviewed at length with the patient today.  Concerns regarding medicines are outlined above.  Medication changes, Labs and Tests ordered today are listed in the Patient Instructions below. Patient Instructions  Dr Sallyanne Kuster recommends that you continue on your current medications as directed. Please refer to the Current Medication list given to you today.  Remote monitoring is used to monitor your Pacemaker of ICD from home. This monitoring reduces the number of office visits required to check your device to one time per year. It allows [place] to keep an eye on the functioning of your device to ensure it is working properly. You are scheduled for a device check from home on Wednesday, May 16th, 2018. You may send your transmission at any time that day. If you have a wireless device, the transmission will be sent automatically. After your physician reviews your transmission, you will receive a postcard with your next transmission date.  Dr Sallyanne Kuster recommends that you schedule a follow-up appointment in 6 months with a pacemaker check. You will receive a reminder letter in the mail two months in advance. If you don't receive a letter, please call our office to schedule the follow-up appointment.  If you need a refill on your cardiac medications before your next appointment, please call your pharmacy.    Signed, Sanda Klein, MD  03/30/2016 3:44 PM    [address], [place], [place]  [address] Phone: [phone]; Fax: [phone]

## 2016-06-29 ENCOUNTER — Ambulatory Visit (INDEPENDENT_AMBULATORY_CARE_PROVIDER_SITE_OTHER): Payer: Medicare Other | Admitting: *Deleted

## 2016-06-29 ENCOUNTER — Telehealth: Payer: Self-pay | Admitting: Cardiology

## 2016-06-29 DIAGNOSIS — I482 Chronic atrial fibrillation: Secondary | ICD-10-CM

## 2016-06-29 DIAGNOSIS — I4821 Permanent atrial fibrillation: Secondary | ICD-10-CM

## 2016-06-29 LAB — CUP PACEART REMOTE DEVICE CHECK
Battery Voltage: 2.73 V
Brady Statistic RV Percent Paced: 85 %
Implantable Lead Implant Date: 20090112
Implantable Lead Location: 753860
Implantable Lead Model: 5076
Lead Channel Impedance Value: 545 Ohm
Lead Channel Impedance Value: 67 Ohm
Lead Channel Pacing Threshold Amplitude: 1 V
Lead Channel Pacing Threshold Pulse Width: 0.4 ms
Lead Channel Setting Pacing Amplitude: 2.5 V
Lead Channel Setting Pacing Pulse Width: 0.4 ms
MDC IDC LEAD IMPLANT DT: 20090112
MDC IDC LEAD LOCATION: 753859
MDC IDC MSMT BATTERY IMPEDANCE: 3121 Ohm
MDC IDC MSMT BATTERY REMAINING LONGEVITY: 22 mo
MDC IDC PG IMPLANT DT: 20090112
MDC IDC SESS DTM: 20180516155022
MDC IDC SET LEADCHNL RV SENSING SENSITIVITY: 2 mV

## 2016-06-29 NOTE — Telephone Encounter (Signed)
Spoke with pt and reminded pt of remote transmission that is due today. Pt verbalized understanding.   

## 2016-06-29 NOTE — Progress Notes (Signed)
Remote pacemaker transmission.   

## 2016-06-30 ENCOUNTER — Encounter: Payer: Self-pay | Admitting: Cardiology

## 2016-09-22 ENCOUNTER — Other Ambulatory Visit (HOSPITAL_BASED_OUTPATIENT_CLINIC_OR_DEPARTMENT_OTHER): Payer: Medicare Other

## 2016-09-22 ENCOUNTER — Ambulatory Visit (HOSPITAL_BASED_OUTPATIENT_CLINIC_OR_DEPARTMENT_OTHER): Payer: Medicare Other | Admitting: Hematology & Oncology

## 2016-09-22 VITALS — BP 141/65 | HR 66 | Temp 98.2°F | Resp 16 | Wt 169.0 lb

## 2016-09-22 DIAGNOSIS — D696 Thrombocytopenia, unspecified: Secondary | ICD-10-CM

## 2016-09-22 DIAGNOSIS — R233 Spontaneous ecchymoses: Secondary | ICD-10-CM

## 2016-09-22 LAB — CBC WITH DIFFERENTIAL (CANCER CENTER ONLY)
BASO#: 0 10*3/uL (ref 0.0–0.2)
BASO%: 0.9 % (ref 0.0–2.0)
EOS ABS: 0.2 10*3/uL (ref 0.0–0.5)
EOS%: 3.4 % (ref 0.0–7.0)
HCT: 38.9 % (ref 34.8–46.6)
HGB: 12.6 g/dL (ref 11.6–15.9)
LYMPH#: 1.4 10*3/uL (ref 0.9–3.3)
LYMPH%: 29.5 % (ref 14.0–48.0)
MCH: 31.5 pg (ref 26.0–34.0)
MCHC: 32.4 g/dL (ref 32.0–36.0)
MCV: 97 fL (ref 81–101)
MONO#: 0.5 10*3/uL (ref 0.1–0.9)
MONO%: 11.4 % (ref 0.0–13.0)
NEUT#: 2.6 10*3/uL (ref 1.5–6.5)
NEUT%: 54.8 % (ref 39.6–80.0)
RBC: 4 10*6/uL (ref 3.70–5.32)
RDW: 14.2 % (ref 11.1–15.7)
WBC: 4.7 10*3/uL (ref 3.9–10.0)

## 2016-09-22 LAB — CHCC SATELLITE - SMEAR

## 2016-09-22 NOTE — Progress Notes (Signed)
Hematology and Oncology Follow Up Visit  [name] [id number] 1929/07/27 83 y.o. 09/22/2016   Principle Diagnosis:   Chronic immune thrombocytopenia-mild  Current Therapy:    Observation     Interim History:  [name] is back for followup. She looks quite good. She has had no problems since we last saw her. Her right hand appears to be doing better. She has surgery on his hand because of severe rheumatoid arthritis. She does not want surgery on the left hand.  She's had no bleeding or bruising. The only exception is that she woke up this morning. She apparently hit the right ocular area. She noted that there was developed and ecchymoses on the inner aspect of the right orbit. Of note, she is on baby aspirin.   She has had no problems with cough or shortness of breath. She has had no problems with infections. There's been no rashes. She's had no leg swelling.   She has had no problems with pain.   Overall, her performance status is ECOG 2.   Medications:  Current Outpatient Prescriptions:  .  ALPRAZolam (XANAX) 0.5 MG tablet, TAKE 1 TABLET BY MOUTH AT BEDTIME, Disp: 30 tablet, Rfl: 0 .  amLODipine-olmesartan (AZOR) 5-20 MG tablet, TAKE 1 TABLET BY MOUTH EVERY DAY, Disp: 90 tablet, Rfl: 3 .  aspirin EC 81 MG tablet, Take 81 mg by mouth every morning., Disp: , Rfl:  .  B Complex Vitamins (VITAMIN B COMPLEX PO), Take 1 tablet by mouth every morning. , Disp: , Rfl:  .  cholecalciferol (VITAMIN D) 1000 units tablet, Take 2,000 Units by mouth daily., Disp: , Rfl:  .  docusate sodium (COLACE) 100 MG capsule, Take 1 capsule (100 mg total) by mouth 2 (two) times daily., Disp: 10 capsule, Rfl: 0 .  furosemide (LASIX) 40 MG tablet, Take 40 mg by mouth every morning. Reported on 01/28/2015, Disp: , Rfl:  .  KLOR-CON M20 20 MEQ tablet, Take 20 mEq by mouth daily., Disp: , Rfl: 4 .  metoprolol tartrate (LOPRESSOR) 25 MG tablet, Take 12.5 mg by mouth 2 (two) times daily. , Disp: , Rfl:    .  Multiple Vitamin (MULTIVITAMIN WITH MINERALS) TABS, Take 1 tablet by mouth every morning., Disp: , Rfl:  .  omeprazole (PRILOSEC) 20 MG capsule, Take 20 mg by mouth every morning. , Disp: , Rfl:  .  OVER THE COUNTER MEDICATION, Nutrifuron (immune supplement) - take 1 tablet/capsule by mouth once daily., Disp: , Rfl:  .  Probiotic CAPS, Take 1 capsule by mouth daily., Disp: , Rfl:  .  simvastatin (ZOCOR) 10 MG tablet, TAKE 1 TABLET BY MOUTH EVERY DAY AT DINNER, Disp: 30 tablet, Rfl: 0  Allergies: No Known Allergies  Past Medical History, Surgical history, Social history, and Family History were reviewed and updated.  Review of Systems: As above  Physical Exam:  weight is 169 lb (76.7 kg). Her oral temperature is 98.2 F (36.8 C). Her blood pressure is 141/65 (abnormal) and her pulse is 66. Her respiration is 16 and oxygen saturation is 97%.   Elderly white female. Head and neck exam shows no ocular or oral lesions. She does have an area of ecchymoses on the inner aspect of her right orbit. She has no palpable cervical or supraclavicular lymph nodes. Lungs are clear. Cardiac exam regular rate and rhythm with no murmurs, rubs or bruits. Abdomen is soft. She has good bowel sounds. There is no fluid wave. There is no palpable  liver or spleen tip. Back exam shows no tenderness over the spine, ribs or hips. Extremities shows significant rheumatoid arthritic changes. Skin exam shows no rashes, ecchymosis or petechia.Neurological exam shows no focal deficits.  Lab Results  Component Value Date   WBC 4.7 09/22/2016   HGB 12.6 09/22/2016   HCT 38.9 09/22/2016   MCV 97 09/22/2016   PLT 88 Platelet count consistent in citrate (L) 09/22/2016     Chemistry      Component Value Date/Time   NA 140 11/11/2015 1102   K 5.0 11/11/2015 1102   CL 106 11/11/2015 1102   CO2 28 11/11/2015 1102   BUN 23 11/11/2015 1102   CREATININE 1.20 (H) 11/11/2015 1102      Component Value Date/Time   CALCIUM  10.4 11/11/2015 1102   ALKPHOS 46 10/02/2015 1044   AST 19 10/02/2015 1044   ALT 13 10/02/2015 1044   BILITOT 0.4 10/02/2015 1044         Impression and Plan: [name] is a 83 year old female. She has mild, non- clinical thrombocytopenia. She has never had a problem with this.  Her platelet count is a little bit lower. However, this should  not be a problem for her.   Given this ecchymoses on the inner aspect of her right orbit, I want to get her back in 2 months.  I told her that there is a new medication that has been approved for patients with low platelets and it is for those who might be needing surgery.   Volanda Napoleon, MD 8/9/20183:39 PM

## 2016-09-28 ENCOUNTER — Ambulatory Visit (INDEPENDENT_AMBULATORY_CARE_PROVIDER_SITE_OTHER): Payer: Medicare Other | Admitting: Cardiovascular Disease

## 2016-09-28 ENCOUNTER — Encounter: Payer: Self-pay | Admitting: Cardiovascular Disease

## 2016-09-28 ENCOUNTER — Telehealth: Payer: Self-pay | Admitting: Cardiovascular Disease

## 2016-09-28 ENCOUNTER — Encounter: Payer: Medicare Other | Admitting: *Deleted

## 2016-09-28 VITALS — BP 122/62 | HR 65 | Ht 63.0 in | Wt 167.6 lb

## 2016-09-28 DIAGNOSIS — Z953 Presence of xenogenic heart valve: Secondary | ICD-10-CM

## 2016-09-28 DIAGNOSIS — I5032 Chronic diastolic (congestive) heart failure: Secondary | ICD-10-CM

## 2016-09-28 DIAGNOSIS — I10 Essential (primary) hypertension: Secondary | ICD-10-CM | POA: Diagnosis not present

## 2016-09-28 DIAGNOSIS — I482 Chronic atrial fibrillation: Secondary | ICD-10-CM | POA: Diagnosis not present

## 2016-09-28 DIAGNOSIS — Z950 Presence of cardiac pacemaker: Secondary | ICD-10-CM

## 2016-09-28 DIAGNOSIS — I4821 Permanent atrial fibrillation: Secondary | ICD-10-CM

## 2016-09-28 LAB — CUP PACEART INCLINIC DEVICE CHECK
Battery Impedance: 3149 Ohm
Brady Statistic RV Percent Paced: 86 %
Date Time Interrogation Session: 20180815134643
Implantable Lead Implant Date: 20090112
Implantable Lead Location: 753859
Implantable Lead Location: 753860
Implantable Lead Model: 4092
Implantable Pulse Generator Implant Date: 20090112
Lead Channel Impedance Value: 67 Ohm
Lead Channel Pacing Threshold Amplitude: 1 V
Lead Channel Pacing Threshold Pulse Width: 0.4 ms
Lead Channel Pacing Threshold Pulse Width: 0.4 ms
Lead Channel Setting Sensing Sensitivity: 2.8 mV
MDC IDC LEAD IMPLANT DT: 20090112
MDC IDC MSMT BATTERY REMAINING LONGEVITY: 21 mo
MDC IDC MSMT BATTERY VOLTAGE: 2.73 V
MDC IDC MSMT LEADCHNL RV IMPEDANCE VALUE: 558 Ohm
MDC IDC MSMT LEADCHNL RV PACING THRESHOLD AMPLITUDE: 1 V
MDC IDC MSMT LEADCHNL RV SENSING INTR AMPL: 5.6 mV
MDC IDC SET LEADCHNL RV PACING AMPLITUDE: 2.5 V
MDC IDC SET LEADCHNL RV PACING PULSEWIDTH: 0.4 ms

## 2016-09-28 NOTE — Telephone Encounter (Signed)
Yes. I would still stop the metoprolol

## 2016-09-28 NOTE — Progress Notes (Signed)
Cardiology Office Note    Date:  09/28/2016   ID:  Legacy [name], DOB Jan 19, 1930, MRN [id number]  PCP:  Christina Cruel, MD  Cardiologist:   Christina Klein, MD   No chief complaint on file. Follow-up CHF, atrial fibrillation and pacemaker check  History of Present Illness:  [name] is a 83 y.o. female with long-standing atrial fibrillation with slow ventricular response and  permanent pacemaker, systemic hypertension and hyperlipidemia, chronic diastolic heart failure and a biological mitral valve prosthesis (2011 29 mm Biocor, Dr. Prescott Gum) returning for follow-up.  Since her last appointment she has not required hospitalization or adjustment in her doses of diuretics. She remains very unsteady on her feet, but has not had falls. She continues to live independently with her husband, with a lot of assistance from their children.  She is not receiving anticoagulation due to frequent and serious falls in the past. She did have left atrial appendage ligation at the time of her mitral valve replacement in 2011, so her risk for stroke is lower.   There has been a marked increase in the frequency of ventricular pacing, now up to 86%. Heart rate histograms remain blunted, consistent with her sedentary lifestyle. Estimated generator longevity is 21 months. Lead parameters are good. There have been a handful of episodes of rapid rates, possibly nonsustained VT versus atrial flutter consisting of just a few beats. These occur on the average about once a month and are not particularly fast (usually around the 160 bpm).  Her Medtronic Adapta dual-chamber pacemaker was implanted in 2009 but is programmed VVIR for what is now permanent atrial fibrillation.   Past Medical History:  Diagnosis Date  . Anxiety   . Arthritis    "mostly my hands" (01/29/2015)  . Basal cell carcinoma of forehead   . Benign essential tremor   . CAP (community acquired pneumonia) 01/29/2015  . CHF (congestive  heart failure) ([place])   . Depression   . Edema of both legs 04-23-2013  pt states feet swelling (wears ted hose)   bilateral venous ablation procedures done in IR, L>R  . GERD (gastroesophageal reflux disease)   . H/O hiatal hernia   . Hyperlipidemia   . Hyperparathyroidism ([place])    MILD--  STABLE  . Hypertension   . Kidney stones   . Osteopenia   . Permanent atrial fibrillation ([place])    CARDIOLOGIST-- DR AGTXMIWO  . Pneumonia "several times"  . Presence of permanent cardiac pacemaker    LAST PACER CHECK  04-23-2013  IN [facility]  . Right ureteral stone   . S/P mitral valve replacement with bioprosthetic valve    04-03-2009  . Schatzki's ring   . Sick sinus syndrome ([place])    S/P  PACEMAKER  2009  . Thrombocytopenia, immune ([place])    CHRONIC---  HEMOTOLOGIST---  DR ENNEVER  . Venous insufficiency, peripheral   . Wears glasses     Past Surgical History:  Procedure Laterality Date  . CARDIAC CATHETERIZATION  03/27/2009   normal coronaries; grade IV Mitral Insuff.  Marland Kitchen CARDIAC CATHETERIZATION  2001  &  2006  . CARDIAC VALVE REPLACEMENT    . CATARACT EXTRACTION W/ INTRAOCULAR LENS  IMPLANT, BILATERAL Bilateral ~ 2012  . CYSTOSCOPY W/ URETERAL STENT PLACEMENT Left 08/01/2012   Procedure: CYSTOSCOPY WITH RETROGRADE PYELOGRAM/URETERAL STENT PLACEMENT;  Surgeon: Dutch Gray, MD;  Location: [facility];  Service: Urology;  Laterality: Left;  . CYSTOSCOPY WITH URETEROSCOPY Left 08/14/2012   Procedure: LEFT  URETEROSCOPY WITH HOLMIUM LASER AND LEFT  STENT PLACEMENT;  Surgeon: Malka So, MD;  Location: [facility];  Service: Urology;  Laterality: Left;  . CYSTOSCOPY WITH URETEROSCOPY AND STENT PLACEMENT Right 04/25/2013   Procedure: RIGHT URETEROSCOPY WITH  STENT PLACEMENT;  Surgeon: Irine Seal, MD;  Location: [facility];  Service: Urology;  Laterality: Right;  . ESOPHAGOGASTRODUODENOSCOPY (EGD) WITH ESOPHAGEAL DILATION  "a couple times" (01/29/2015)  . EXTRACORPOREAL SHOCK WAVE LITHOTRIPSY  Left 07-26-2012  . HAND SURGERY Right 05/2011   "no fracture; straightened things up  . HAND SURGERY Left 2014   "no fracture; straightened things up"  . HOLMIUM LASER APPLICATION Left 0/02/7492   Procedure:  HOLMIUM LASER APPLICATION;  Surgeon: Malka So, MD;  Location: [facility];  Service: Urology;  Laterality: Left;  . HOLMIUM LASER APPLICATION Right 4/96/7591   Procedure: HOLMIUM LASER APPLICATION;  Surgeon: Irine Seal, MD;  Location: [facility];  Service: Urology;  Laterality: Right;  . MITRAL VALVE REPLACEMENT WITH CHORDAL PRESEVATION USING BIOPROSTHETIC VALVE/  LIGATION LEFT ATRIAL APPENDAGE/  LEFT-SIDED MAZE PROCEDURE  04-03-2009  DR [name],  SERIAL # [id number]  . NM MYOCAR PERF WALL MOTION  03/01/1999   mild lateral ischemia  . PERMANENT PACEMAKER INSERTION  02/26/2007   Dual Chamber Medtronic Programmed VVIR--- Generator [id number]/  #[id number]   . TRANSTHORACIC ECHOCARDIOGRAM  09-07-2012  DR Katriona Schmierer   MILD LVH/  EF 55-60%/  MILD TO MODERATE AR/   SEVERE LAE  &  RAE/  MILD TO MODERATE TR/   MITRAL VALVE BIOPROSTHESIS LEAFLET NORMAL,  MODERATE STENOSIS WITH NO SIG. REGURG/  MODERATE RVE  . VAGINAL HYSTERECTOMY  1982    Current Medications: Outpatient Medications Prior to Visit  Medication Sig Dispense Refill  . ALPRAZolam (XANAX) 0.5 MG tablet TAKE 1 TABLET BY MOUTH AT BEDTIME 30 tablet 0  . amLODipine-olmesartan (AZOR) 5-20 MG tablet TAKE 1 TABLET BY MOUTH EVERY DAY 90 tablet 3  . aspirin EC 81 MG tablet Take 81 mg by mouth every morning.    . B Complex Vitamins (VITAMIN B COMPLEX PO) Take 1 tablet by mouth every morning.     . cholecalciferol (VITAMIN D) 1000 units tablet Take 2,000 Units by mouth daily.    Marland Kitchen docusate sodium (COLACE) 100 MG capsule Take 1 capsule (100 mg total) by mouth 2 (two) times daily. 10 capsule 0  . furosemide (LASIX) 40 MG tablet Take 40 mg by mouth every morning. Reported on 01/28/2015    . KLOR-CON M20 20 MEQ tablet Take 20  mEq by mouth daily.  4  . Multiple Vitamin (MULTIVITAMIN WITH MINERALS) TABS Take 1 tablet by mouth every morning.    Marland Kitchen omeprazole (PRILOSEC) 20 MG capsule Take 20 mg by mouth every morning.     Marland Kitchen OVER THE COUNTER MEDICATION Nutrifuron (immune supplement) - take 1 tablet/capsule by mouth once daily.    . Probiotic CAPS Take 1 capsule by mouth daily.    . simvastatin (ZOCOR) 10 MG tablet TAKE 1 TABLET BY MOUTH EVERY DAY AT DINNER 30 tablet 0  . metoprolol tartrate (LOPRESSOR) 25 MG tablet Take 12.5 mg by mouth 2 (two) times daily.      No facility-administered medications prior to visit.      Allergies:   Patient has no known allergies.   Social History   Social History  . Marital status: Married    Spouse name: N/A  . Number of children: N/A  .  Years of education: N/A   Social History Main Topics  . Smoking status: Never Smoker  . Smokeless tobacco: Never Used     Comment: never used tobacco  . Alcohol use No  . Drug use: No  . Sexual activity: No   Other Topics Concern  . None   Social History Narrative  . None     Family History:  The patient's family history includes Cancer in her mother; Heart attack in her father.   ROS:   Please see the history of present illness.    ROS All other systems reviewed and are negative.   PHYSICAL EXAM:   VS:  BP 122/62   Pulse 65   Ht 5\' 3"  (1.6 m)   Wt 167 lb 9.6 oz (76 kg)   BMI 29.69 kg/m    GEN: Well nourished, well developed, in no acute distress . She appears elderly and a little frail, but has no evidence of cognitive deficits Head: no evidence of trauma, PERRL, EOMI, no exophtalmos or lid lag, no myxedema, no xanthelasma; normal ears, nose and oropharynx Neck: normal jugular venous pulsations and no hepatojugular reflux; brisk carotid pulses without delay and no carotid bruits Chest: clear to auscultation, no signs of consolidation by percussion or palpation, normal fremitus, symmetrical and full respiratory  excursions Cardiovascular: normal position and quality of the apical impulse, irregular rhythm, normal first and second heart sounds, no murmurs, rubs or gallops. Healthy subclavian pacemaker site Abdomen: no tenderness or distention, no masses by palpation, no abnormal pulsatility or arterial bruits, normal bowel sounds, no hepatosplenomegaly Extremities: no clubbing, cyanosis or edema; 2+ radial, ulnar and brachial pulses bilaterally; 2+ right femoral, posterior tibial and dorsalis pedis pulses; 2+ left femoral, posterior tibial and dorsalis pedis pulses; no subclavian or femoral bruits Neurological: grossly nonfocal Psych: euthymic mood, full affect  Wt Readings from Last 3 Encounters:  09/28/16 167 lb 9.6 oz (76 kg)  09/22/16 169 lb (76.7 kg)  03/30/16 172 lb (78 kg)      Studies/Labs Reviewed:   EKG:  EKG is ordered today.  The ekg ordered today demonstrates atrial fibrillation, 100% V paced,  Recent Labs: 10/02/2015: ALT 13 11/11/2015: BUN 23; Creat 1.20; Potassium 5.0; Sodium 140 09/22/2016: HGB 12.6; Platelets 88 Platelet count consistent in citrate   Lipid Panel    Component Value Date/Time   CHOL 172 10/02/2015 1044   TRIG 72 10/02/2015 1044   HDL 89 10/02/2015 1044   CHOLHDL 1.9 10/02/2015 1044   VLDL 14 10/02/2015 1044   LDLCALC 69 10/02/2015 1044     ASSESSMENT:    1. Chronic diastolic congestive heart failure ([place])   2. Permanent atrial fibrillation ([place])   3. S/P mitral valve replacement with bioprosthetic valve - 29 mm Biocor   4. Essential hypertension   5. Pacemaker      PLAN:  In order of problems listed above:  1. CHF: Appears euvolemic from a clinical standpoint. No change in diuretic dose. Functional status is hard to assess because of her very sedentary lifestyle 2. AFib: Slow ventricular response and almost 90% ventricular pacing. Notwithstanding her occasional episodes of nonsustained VT, I think she'll benefit from complete discontinuation of her  beta blocker to avoid the high frequency of ventricular pacing, deleterious for her heart failure.Marland Kitchen No changes made today. Her embolic risk is high due to previous mitral valve replacement, but less due to the fact that she has had appendage clipping. She remains a poor candidate for anticoagulation  due to her frailty and tendency to fall. 3. S/p MVR: Moderately elevated gradients by previous echocardiography, stable over time. She would not be a candidate for repeat surgery. 4. HTN: Satisfactory control, doubt that stopping the beta blocker will have much of an impact on her blood pressure 5. Pacemaker: Normal device function. Remote download in 3 months. Will see in the office every 6 months until we can be confident about the remote downloads.    Current medications reviewed at length with the patient today.  Concerns regarding medicines are outlined above.  Medication changes, Labs and Tests ordered today are listed in the Patient Instructions below. Patient Instructions  Dr Sallyanne Kuster has recommended making the following medication changes: 1. STOP Metoprolol  Remote monitoring is used to monitor your Pacemaker of ICD from home. This monitoring reduces the number of office visits required to check your device to one time per year. It allows [place] to keep an eye on the functioning of your device to ensure it is working properly. You are scheduled for a device check from home on Wednesday, November 14th, 2018. You may send your transmission at any time that day. If you have a wireless device, the transmission will be sent automatically. After your physician reviews your transmission, you will receive a postcard with your next transmission date.  Dr Sallyanne Kuster recommends that you schedule a follow-up appointment in 6 months with a pacemaker check. You will receive a reminder letter in the mail two months in advance. If you don't receive a letter, please call our office to schedule the follow-up  appointment.  If you need a refill on your cardiac medications before your next appointment, please call your pharmacy.    Signed, Christina Klein, MD  09/28/2016 11:02 AM    [facility] [place], [place], [place]  [address] Phone: [phone]; Fax: [phone]

## 2016-09-28 NOTE — Telephone Encounter (Signed)
New message    Pt c/o medication issue:  1. Name of Medication: metroprolol  2. How are you currently taking this medication (dosage and times per day)? unknown  3. Are you having a reaction (difficulty breathing--STAT)? no  4. What is your medication issue? Pt wants to know why she was taken off of this medication

## 2016-09-28 NOTE — Telephone Encounter (Signed)
Pt notified to stop metoprolol

## 2016-09-28 NOTE — Telephone Encounter (Signed)
Per todays OV:  HTN: Satisfactory control, doubt that stopping the beta blocker will have much of an impact on her blood pressure  Pt notified of above note, she states that she needs to tell Dr Sallyanne Kuster that her BP is "good" in the am but she forgot to tell him that her BP in the afternoon is usually up in the 140's so she wants to know if she should still stop the Metoprolol? Please advise

## 2016-09-28 NOTE — Patient Instructions (Signed)
Dr Sallyanne Kuster has recommended making the following medication changes: 1. STOP Metoprolol  Remote monitoring is used to monitor your Pacemaker of ICD from home. This monitoring reduces the number of office visits required to check your device to one time per year. It allows [place] to keep an eye on the functioning of your device to ensure it is working properly. You are scheduled for a device check from home on Wednesday, November 14th, 2018. You may send your transmission at any time that day. If you have a wireless device, the transmission will be sent automatically. After your physician reviews your transmission, you will receive a postcard with your next transmission date.  Dr Sallyanne Kuster recommends that you schedule a follow-up appointment in 6 months with a pacemaker check. You will receive a reminder letter in the mail two months in advance. If you don't receive a letter, please call our office to schedule the follow-up appointment.  If you need a refill on your cardiac medications before your next appointment, please call your pharmacy.

## 2016-09-28 NOTE — Addendum Note (Signed)
Addended by: Zebedee Iba on: 09/28/2016 11:16 AM   Modules accepted: Orders

## 2016-10-05 ENCOUNTER — Encounter: Payer: Self-pay | Admitting: Cardiology

## 2016-10-07 ENCOUNTER — Other Ambulatory Visit: Payer: Self-pay | Admitting: Cardiovascular Disease

## 2016-11-23 ENCOUNTER — Ambulatory Visit: Payer: Medicare Other | Admitting: Hematology & Oncology

## 2016-11-23 ENCOUNTER — Other Ambulatory Visit: Payer: Medicare Other

## 2016-11-24 ENCOUNTER — Other Ambulatory Visit: Payer: Medicare Other

## 2016-11-24 ENCOUNTER — Ambulatory Visit: Payer: Medicare Other | Admitting: Hematology & Oncology

## 2016-11-25 ENCOUNTER — Ambulatory Visit (HOSPITAL_BASED_OUTPATIENT_CLINIC_OR_DEPARTMENT_OTHER): Payer: Medicare Other | Admitting: Hematology & Oncology

## 2016-11-25 ENCOUNTER — Other Ambulatory Visit (HOSPITAL_BASED_OUTPATIENT_CLINIC_OR_DEPARTMENT_OTHER): Payer: Medicare Other

## 2016-11-25 VITALS — BP 129/59 | HR 83 | Temp 98.0°F | Resp 20 | Wt 164.4 lb

## 2016-11-25 DIAGNOSIS — D696 Thrombocytopenia, unspecified: Secondary | ICD-10-CM

## 2016-11-25 DIAGNOSIS — Z950 Presence of cardiac pacemaker: Secondary | ICD-10-CM

## 2016-11-25 LAB — CBC WITH DIFFERENTIAL (CANCER CENTER ONLY)
BASO#: 0 10*3/uL (ref 0.0–0.2)
BASO%: 1 % (ref 0.0–2.0)
EOS%: 3 % (ref 0.0–7.0)
Eosinophils Absolute: 0.1 10*3/uL (ref 0.0–0.5)
HEMATOCRIT: 40.6 % (ref 34.8–46.6)
HEMOGLOBIN: 12.9 g/dL (ref 11.6–15.9)
LYMPH#: 1.3 10*3/uL (ref 0.9–3.3)
LYMPH%: 31.8 % (ref 14.0–48.0)
MCH: 31 pg (ref 26.0–34.0)
MCHC: 31.8 g/dL — ABNORMAL LOW (ref 32.0–36.0)
MCV: 98 fL (ref 81–101)
MONO#: 0.4 10*3/uL (ref 0.1–0.9)
MONO%: 10.3 % (ref 0.0–13.0)
NEUT%: 53.9 % (ref 39.6–80.0)
NEUTROS ABS: 2.2 10*3/uL (ref 1.5–6.5)
Platelets: 88 10*3/uL — ABNORMAL LOW (ref 145–400)
RBC: 4.16 10*6/uL (ref 3.70–5.32)
RDW: 14.6 % (ref 11.1–15.7)
WBC: 4 10*3/uL (ref 3.9–10.0)

## 2016-11-25 LAB — TECHNOLOGIST REVIEW CHCC SATELLITE

## 2016-11-25 LAB — CHCC SATELLITE - SMEAR
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
## 2016-12-28 ENCOUNTER — Ambulatory Visit (INDEPENDENT_AMBULATORY_CARE_PROVIDER_SITE_OTHER): Payer: Medicare Other | Admitting: *Deleted

## 2016-12-28 DIAGNOSIS — I482 Chronic atrial fibrillation: Secondary | ICD-10-CM

## 2016-12-28 DIAGNOSIS — I4821 Permanent atrial fibrillation: Secondary | ICD-10-CM

## 2016-12-28 NOTE — Progress Notes (Signed)
Remote pacemaker transmission.   

## 2016-12-29 LAB — CUP PACEART REMOTE DEVICE CHECK
Battery Impedance: 3385 Ohm
Battery Voltage: 2.71 V
Brady Statistic RV Percent Paced: 42 %
Date Time Interrogation Session: 20181114171118
Implantable Lead Implant Date: 20090112
Implantable Lead Location: 753860
Lead Channel Impedance Value: 67 Ohm
Lead Channel Setting Pacing Amplitude: 2.5 V
Lead Channel Setting Pacing Pulse Width: 0.4 ms
Lead Channel Setting Sensing Sensitivity: 2 mV
MDC IDC LEAD IMPLANT DT: 20090112
MDC IDC LEAD LOCATION: 753859
MDC IDC MSMT BATTERY REMAINING LONGEVITY: 22 mo
MDC IDC MSMT LEADCHNL RV IMPEDANCE VALUE: 590 Ohm
MDC IDC MSMT LEADCHNL RV PACING THRESHOLD AMPLITUDE: 1.125 V
MDC IDC MSMT LEADCHNL RV PACING THRESHOLD PULSEWIDTH: 0.4 ms
MDC IDC PG IMPLANT DT: 20090112

## 2016-12-30 ENCOUNTER — Encounter: Payer: Self-pay | Admitting: Cardiology

## 2017-03-06 ENCOUNTER — Encounter: Payer: Self-pay | Admitting: Cardiovascular Disease

## 2017-03-29 ENCOUNTER — Ambulatory Visit (INDEPENDENT_AMBULATORY_CARE_PROVIDER_SITE_OTHER): Payer: Medicare Other | Admitting: *Deleted

## 2017-03-29 ENCOUNTER — Telehealth: Payer: Self-pay | Admitting: Cardiology

## 2017-03-29 DIAGNOSIS — I4821 Permanent atrial fibrillation: Secondary | ICD-10-CM

## 2017-03-29 DIAGNOSIS — I482 Chronic atrial fibrillation: Secondary | ICD-10-CM | POA: Diagnosis not present

## 2017-03-29 NOTE — Telephone Encounter (Signed)
Informed pt that her remote transmission was received. Pt verbalized understanding.

## 2017-03-29 NOTE — Progress Notes (Signed)
Remote pacemaker transmission.   

## 2017-03-30 ENCOUNTER — Encounter: Payer: Self-pay | Admitting: Cardiology

## 2017-04-05 ENCOUNTER — Encounter: Payer: Self-pay | Admitting: Cardiovascular Disease

## 2017-04-05 ENCOUNTER — Ambulatory Visit (INDEPENDENT_AMBULATORY_CARE_PROVIDER_SITE_OTHER): Payer: Medicare Other | Admitting: Cardiovascular Disease

## 2017-04-05 VITALS — BP 114/60 | HR 92 | Ht 63.0 in | Wt 162.0 lb

## 2017-04-05 DIAGNOSIS — I10 Essential (primary) hypertension: Secondary | ICD-10-CM

## 2017-04-05 DIAGNOSIS — Z950 Presence of cardiac pacemaker: Secondary | ICD-10-CM

## 2017-04-05 DIAGNOSIS — I4819 Other persistent atrial fibrillation: Secondary | ICD-10-CM

## 2017-04-05 DIAGNOSIS — Z953 Presence of xenogenic heart valve: Secondary | ICD-10-CM

## 2017-04-05 DIAGNOSIS — I481 Persistent atrial fibrillation: Secondary | ICD-10-CM

## 2017-04-05 DIAGNOSIS — I5032 Chronic diastolic (congestive) heart failure: Secondary | ICD-10-CM | POA: Diagnosis not present

## 2017-04-05 DIAGNOSIS — I4891 Unspecified atrial fibrillation: Secondary | ICD-10-CM

## 2017-04-05 LAB — CUP PACEART INCLINIC DEVICE CHECK
Implantable Lead Implant Date: 20090112
Implantable Lead Location: 753860
Implantable Pulse Generator Implant Date: 20090112
Lead Channel Setting Pacing Amplitude: 2.5 V
Lead Channel Setting Pacing Pulse Width: 0.4 ms
Lead Channel Setting Sensing Sensitivity: 2 mV
MDC IDC LEAD IMPLANT DT: 20090112
MDC IDC LEAD LOCATION: 753859
MDC IDC SESS DTM: 20190220143423

## 2017-04-05 NOTE — Patient Instructions (Signed)

## 2017-04-05 NOTE — Progress Notes (Signed)
Cardiology Office Note    Date:  04/05/2017   ID:  [name], DOB 12-30-1929, MRN [id number]  PCP:  Christina Cruel, MD  Cardiologist:   Christina Klein, MD   Chief Complaint  Patient presents with  . Follow-up    6 months  Follow-up CHF, atrial fibrillation and pacemaker check  History of Present Illness:  [name] is a 84 y.o. female with long-standing atrial fibrillation with slow ventricular response and  permanent pacemaker, systemic hypertension and hyperlipidemia, chronic diastolic heart failure and a biological mitral valve prosthesis (2011 29 mm Biocor, Christina. Prescott Wilkinson) returning for follow-up.  She is accompanied by her daughter, as always.  She continues to live independently with her husband, with a lot of support from their family.  She has not had serious medical problems since her last office visit a year ago.  She has not required hospitalization.  She bruises easily and has moderate thrombocytopenia, followed by her hematologist [name].  She has not had any serious injuries or recent falls or major bleeding problems. She is not receiving anticoagulation due to frequent and serious falls in the past. She did have left atrial appendage ligation at the time of her mitral valve replacement in 2011, Wilkinson her risk for stroke is lower.   She has not required adjustment in her diuretic dose and does not have problems with shortness of breath.  She specifically denies orthopnea or PND.  She does have intermittent lower extremity edema, but also has very severe peripheral venous insufficiency of the lower extremities with prominent varicose veins.  She has not had any skin ulcerations or cellulitis.[name] Kitchen  Her most renal function labs showed an improved creatinine around 1.0.  Other recent labs in December 2018 include a reasonably good lipid profile with total cholesterol 206, LDL 103, HDL 84, triglycerides 92, potassium 4.6 and normal liver function tests.  There has  been a decrease in the need for ventricular pacing now down to 41 %. Heart rate histograms remain blunted, consistent with her sedentary lifestyle.  She has very infrequent episodes of rapid ventricular response and there is no convincing evidence that this could represent ventricular tachycardia.  Rate control is generally adequate.  Her Medtronic Adapta dual-chamber pacemaker was implanted in 2009 but is programmed VVIR for what is now permanent atrial fibrillation.  Estimated generator longevity is 19 months.  Past Medical History:  Diagnosis Date  . Anxiety   . Arthritis    "mostly my hands" (01/29/2015)  . Basal cell carcinoma of forehead   . Benign essential tremor   . CAP (community acquired pneumonia) 01/29/2015  . CHF (congestive heart failure) ([place])   . Depression   . Edema of both legs 04-23-2013  pt states feet swelling (wears ted hose)   bilateral venous ablation procedures done in IR, L>R  . GERD (gastroesophageal reflux disease)   . H/O hiatal hernia   . Hyperlipidemia   . Hyperparathyroidism ([place])    MILD--  Wilkinson  . Hypertension   . Kidney stones   . Osteopenia   . Permanent atrial fibrillation ([place])    CARDIOLOGIST-- [name]  . Pneumonia "several times"  . Presence of permanent cardiac pacemaker    LAST PACER CHECK  04-23-2013  IN [facility]  . Right ureteral stone   . S/P mitral valve replacement with bioprosthetic valve    04-03-2009  . Schatzki's ring   . Sick sinus syndrome (HCC)    S/P  PACEMAKER  2009  . Thrombocytopenia, immune ([place])    CHRONIC---  HEMOTOLOGIST---  [name]  . Venous insufficiency, peripheral   . Wears glasses     Past Surgical History:  Procedure Laterality Date  . CARDIAC CATHETERIZATION  03/27/2009   normal coronaries; grade IV Mitral Insuff.  [name] Kitchen CARDIAC CATHETERIZATION  2001  &  2006  . CARDIAC VALVE REPLACEMENT    . CATARACT EXTRACTION W/ INTRAOCULAR LENS  IMPLANT, BILATERAL Bilateral ~ 2012  . CYSTOSCOPY W/ URETERAL STENT  PLACEMENT Left 08/01/2012   Procedure: CYSTOSCOPY WITH RETROGRADE PYELOGRAM/URETERAL STENT PLACEMENT;  Surgeon: Christina Gray, MD;  Location: [facility];  Service: Urology;  Laterality: Left;  . CYSTOSCOPY WITH URETEROSCOPY Left 08/14/2012   Procedure: LEFT URETEROSCOPY WITH HOLMIUM LASER AND LEFT  STENT PLACEMENT;  Surgeon: Christina So, MD;  Location: [facility];  Service: Urology;  Laterality: Left;  . CYSTOSCOPY WITH URETEROSCOPY AND STENT PLACEMENT Right 04/25/2013   Procedure: RIGHT URETEROSCOPY WITH  STENT PLACEMENT;  Surgeon: Christina Seal, MD;  Location: [facility];  Service: Urology;  Laterality: Right;  . ESOPHAGOGASTRODUODENOSCOPY (EGD) WITH ESOPHAGEAL DILATION  "a couple times" (01/29/2015)  . EXTRACORPOREAL SHOCK WAVE LITHOTRIPSY Left 07-26-2012  . HAND SURGERY Right 05/2011   "no fracture; straightened things up  . HAND SURGERY Left 2014   "no fracture; straightened things up"  . HOLMIUM LASER APPLICATION Left 3/0/1601   Procedure:  HOLMIUM LASER APPLICATION;  Surgeon: Christina So, MD;  Location: [facility];  Service: Urology;  Laterality: Left;  . HOLMIUM LASER APPLICATION Right 0/93/2355   Procedure: HOLMIUM LASER APPLICATION;  Surgeon: Christina Seal, MD;  Location[facility];  Service: Urology;  Laterality: Right;  . MITRAL VALVE REPLACEMENT WITH CHORDAL PRESEVATION USING BIOPROSTHETIC VALVE/  LIGATION LEFT ATRIAL APPENDAGE/  LEFT-SIDED MAZE PROCEDURE  04-03-2009  Christina [name],  SERIAL # [id number]  . NM MYOCAR PERF WALL MOTION  03/01/1999   mild lateral ischemia  . PERMANENT PACEMAKER INSERTION  02/26/2007   Dual Chamber Medtronic Programmed VVIR--- Generator [id number]/  #[id number] H   . TRANSTHORACIC ECHOCARDIOGRAM  09-07-2012  Christina [name]   MILD LVH/  EF 55-60%/  MILD TO MODERATE AR/   SEVERE LAE  &  RAE/  MILD TO MODERATE TR/   MITRAL VALVE BIOPROSTHESIS LEAFLET NORMAL,  MODERATE STENOSIS WITH NO SIG. REGURG/  MODERATE RVE  . VAGINAL HYSTERECTOMY  1982     Current Medications: Outpatient Medications Prior to Visit  Medication Sig Dispense Refill  . ALPRAZolam (XANAX) 0.5 MG tablet TAKE 1 TABLET BY MOUTH AT BEDTIME 30 tablet 0  . amLODipine-olmesartan (AZOR) 5-20 MG tablet TAKE 1 TABLET BY MOUTH EVERY DAY 90 tablet 1  . aspirin EC 81 MG tablet Take 81 mg by mouth every morning.    . B Complex Vitamins (VITAMIN B COMPLEX PO) Take 1 tablet by mouth every morning.     . cholecalciferol (VITAMIN D) 1000 units tablet Take 2,000 Units by mouth daily.    [name] Kitchen docusate sodium (COLACE) 100 MG capsule Take 1 capsule (100 mg total) by mouth 2 (two) times daily. 10 capsule 0  . furosemide (LASIX) 40 MG tablet Take 40 mg by mouth every morning. Reported on 01/28/2015    . KLOR-CON M20 20 MEQ tablet Take 20 mEq by mouth daily.  4  . Multiple Vitamin (MULTIVITAMIN WITH MINERALS) TABS Take 1 tablet by mouth every morning.    [name] Kitchen omeprazole (PRILOSEC) 20 MG capsule  Take 20 mg by mouth every morning.     [name] Kitchen OVER THE COUNTER MEDICATION Nutrifuron (immune supplement) - take 1 tablet/capsule by mouth once daily.    . Probiotic CAPS Take 1 capsule by mouth daily.    . simvastatin (ZOCOR) 10 MG tablet TAKE 1 TABLET BY MOUTH EVERY DAY AT DINNER 30 tablet 0   No facility-administered medications prior to visit.      Allergies:   Patient has no known allergies.   Social History   Socioeconomic History  . Marital status: Married    Spouse name: None  . Number of children: None  . Years of education: None  . Highest education level: None  Social Needs  . Financial resource strain: None  . Food insecurity - worry: None  . Food insecurity - inability: None  . Transportation needs - medical: None  . Transportation needs - non-medical: None  Occupational History  . None  Tobacco Use  . Smoking status: Never Smoker  . Smokeless tobacco: Never Used  . Tobacco comment: never used tobacco  Substance and Sexual Activity  . Alcohol use: No    Alcohol/week: 0.0 oz   . Drug use: No  . Sexual activity: No  Other Topics Concern  . None  Social History Narrative  . None     Family History:  The patient's family history includes Cancer in her mother; Heart attack in her father.   ROS:   Please see the history of present illness.    ROS All other systems reviewed and are negative.   PHYSICAL EXAM:   VS:  BP 114/60   Pulse 92   Ht 5\' 3"  (1.6 m)   Wt 162 lb (73.5 kg)   BMI 28.70 kg/m     General: Alert, oriented x3, no distress, mildly overweight, appears elderly and frail. Head: no evidence of trauma, PERRL, EOMI, no exophtalmos or lid lag, no myxedema, no xanthelasma; normal ears, nose and oropharynx Neck: normal jugular venous pulsations and no hepatojugular reflux; brisk carotid pulses without delay and no carotid bruits Chest: clear to auscultation, no signs of consolidation by percussion or palpation, normal fremitus, symmetrical and full respiratory excursions Cardiovascular: normal position and quality of the apical impulse, regular rhythm, normal first and second heart sounds, no murmurs, rubs or gallops Abdomen: no tenderness or distention, no masses by palpation, no abnormal pulsatility or arterial bruits, normal bowel sounds, no hepatosplenomegaly Extremities: no clubbing, cyanosis or edema; she does have very prominent varicose veins in both her lower extremities.  2+ radial, ulnar and brachial pulses bilaterally; 2+ right femoral, posterior tibial and dorsalis pedis pulses; 2+ left femoral, posterior tibial and dorsalis pedis pulses; no subclavian or femoral bruits Neurological: grossly nonfocal Psych: Normal mood and affect   Wt Readings from Last 3 Encounters:  04/05/17 162 lb (73.5 kg)  11/25/16 164 lb 6.4 oz (74.6 kg)  09/28/16 167 lb 9.6 oz (76 kg)      Studies/Labs Reviewed:   EKG:  EKG is ordered today.  It shows atrial fibrillation with a ventricular rate of 92 bpm and delayed R wave progression across the anterior  precordium.  Normal QTC 408 ms Recent Labs: 11/25/2016: HGB 12.9; Platelets 88 Platelet count consistent in citrate  BMET    Component Value Date/Time   NA 140 11/11/2015 1102   K 5.0 11/11/2015 1102   CL 106 11/11/2015 1102   CO2 28 11/11/2015 1102   GLUCOSE 86 11/11/2015 1102   BUN 23  11/11/2015 1102   CREATININE 1.20 (H) 11/11/2015 1102   CALCIUM 10.4 11/11/2015 1102   GFRNONAA 56 (L) 02/04/2015 0555   GFRAA >60 02/04/2015 0555   December 2018:potassium 4.6 and normal liver function tests, creatinine 1.0  Lipid Panel    Component Value Date/Time   CHOL 172 10/02/2015 1044   TRIG 72 10/02/2015 1044   HDL 89 10/02/2015 1044   CHOLHDL 1.9 10/02/2015 1044   VLDL 14 10/02/2015 1044   LDLCALC 69 10/02/2015 1044   December 2018: Total cholesterol 206, LDL 103, HDL 84, triglycerides 92  ASSESSMENT:    1. Chronic diastolic congestive heart failure (HCC)   2. Persistent atrial fibrillation ([place])   3. History of mitral valve replacement with bioprosthetic valve   4. Essential hypertension   5. Pacemaker      PLAN:  In order of problems listed above:  1. CHF: Appears euvolemic from a clinical standpoint.  Functional status is very difficult to assess since she is Wilkinson sedentary, but clinically she seems well.  I do not think she needs to take daily diuretics.  She is currently taking furosemide only 3 days a week and seems pretty well compensated.  However, if she notices weight gain/edema/dyspnea there is plenty of room to increase her diuretic dose.   2. AFib: Intermediate embolic risk.  Has had previous left atrial appendage clipping but does have a mitral valve biological prosthesis.  I think staying off anticoagulation remains the wisest course of action.  Rate control remains adequate after we completely discontinued all her medications with negative chronotropic effect. 3. S/p MVR: Moderately elevated gradients by previous echocardiography, Wilkinson over time. She would not be  a candidate for repeat surgery, Wilkinson we have not repeated her echocardiogram since 2014 4. HTN: Control remains excellent even without the beta-blocker that we discontinued last year 5. Pacemaker: She is not pacemaker dependent.  Device function is normal.  Continue downloads via [facility] every 3 months.   Current medications reviewed at length with the patient today.  Concerns regarding medicines are outlined above.  Medication changes, Labs and Tests ordered today are listed in the Patient Instructions below. Patient Instructions  Christina Sallyanne Kuster recommends that you continue on your current medications as directed. Please refer to the Current Medication list given to you today.  Remote monitoring is used to monitor your Pacemaker or ICD from home. This monitoring reduces the number of office visits required to check your device to one time per year. It allows [place] to keep an eye on the functioning of your device to ensure it is working properly. You are scheduled for a device check from home on Wednesday, May 15th, 2019. You may send your transmission at any time that day. If you have a wireless device, the transmission will be sent automatically. After your physician reviews your transmission, you will receive a notification with your next transmission date.  Christina Sallyanne Kuster recommends that you schedule a follow-up appointment in 12 months with a pacemaker check. You will receive a reminder letter in the mail two months in advance. If you don't receive a letter, please call our office to schedule the follow-up appointment.  If you need a refill on your cardiac medications before your next appointment, please call your pharmacy.    Signed, Christina Klein, MD  04/05/2017 6:07 PM    [address], [place], [place]  [address] Phone: [phone]; Fax: [phone]

## 2017-04-08 LAB — CUP PACEART REMOTE DEVICE CHECK
Battery Remaining Longevity: 19 mo
Battery Voltage: 2.71 V
Brady Statistic RV Percent Paced: 41 %
Date Time Interrogation Session: 20190213155758
Implantable Lead Implant Date: 20090112
Implantable Lead Location: 753859
Implantable Lead Model: 4092
Implantable Lead Model: 5076
Lead Channel Impedance Value: 512 Ohm
Lead Channel Pacing Threshold Amplitude: 1.125 V
Lead Channel Setting Pacing Pulse Width: 0.4 ms
Lead Channel Setting Sensing Sensitivity: 2 mV
MDC IDC LEAD IMPLANT DT: 20090112
MDC IDC LEAD LOCATION: 753860
MDC IDC MSMT BATTERY IMPEDANCE: 3644 Ohm
MDC IDC MSMT LEADCHNL RA IMPEDANCE VALUE: 67 Ohm
MDC IDC MSMT LEADCHNL RV PACING THRESHOLD PULSEWIDTH: 0.4 ms
MDC IDC PG IMPLANT DT: 20090112
MDC IDC SET LEADCHNL RV PACING AMPLITUDE: 2.5 V

## 2017-04-12 ENCOUNTER — Other Ambulatory Visit: Payer: Self-pay | Admitting: Cardiovascular Disease

## 2017-04-12 NOTE — Telephone Encounter (Signed)
REFILL 

## 2017-04-21 ENCOUNTER — Ambulatory Visit: Payer: Medicare Other | Admitting: Hematology & Oncology

## 2017-04-21 ENCOUNTER — Other Ambulatory Visit: Payer: Medicare Other

## 2017-05-26 ENCOUNTER — Other Ambulatory Visit: Payer: Medicare Other

## 2017-05-26 ENCOUNTER — Ambulatory Visit: Payer: Medicare Other | Admitting: Family

## 2017-06-28 ENCOUNTER — Encounter: Payer: Medicare Other | Admitting: *Deleted

## 2017-06-28 ENCOUNTER — Telehealth: Payer: Self-pay | Admitting: Cardiology

## 2017-06-28 NOTE — Telephone Encounter (Signed)
LMOVM reminding pt to send remote transmission.   

## 2017-06-29 ENCOUNTER — Encounter: Payer: Self-pay | Admitting: Cardiology

## 2017-08-15 ENCOUNTER — Telehealth: Payer: Self-pay | Admitting: *Deleted

## 2017-08-15 NOTE — Telephone Encounter (Signed)
Call received from Anette Guarneri in reference to "scheduling an appointment at [facility] for my blood disorder.  I've seen Dr. Marin Olp for several years.  My husband is sick, can't come but I need an appointment."    Provided caller with number [phone] for this office to assist and help with this request.

## 2017-09-01 ENCOUNTER — Encounter: Payer: Self-pay | Admitting: Hematology & Oncology

## 2017-09-01 ENCOUNTER — Inpatient Hospital Stay (HOSPITAL_BASED_OUTPATIENT_CLINIC_OR_DEPARTMENT_OTHER): Payer: Medicare Other | Admitting: Hematology & Oncology

## 2017-09-01 ENCOUNTER — Inpatient Hospital Stay: Payer: Medicare Other | Attending: Hematology & Oncology

## 2017-09-01 ENCOUNTER — Other Ambulatory Visit: Payer: Self-pay

## 2017-09-01 VITALS — BP 139/68 | HR 76 | Temp 97.8°F | Resp 16 | Wt 163.0 lb

## 2017-09-01 DIAGNOSIS — D696 Thrombocytopenia, unspecified: Secondary | ICD-10-CM | POA: Diagnosis present

## 2017-09-01 LAB — CBC WITH DIFFERENTIAL (CANCER CENTER ONLY)
BASOS PCT: 1 %
Basophils Absolute: 0 10*3/uL (ref 0.0–0.1)
Eosinophils Absolute: 0.2 10*3/uL (ref 0.0–0.5)
Eosinophils Relative: 4 %
HEMATOCRIT: 37.9 % (ref 34.8–46.6)
HEMOGLOBIN: 12.2 g/dL (ref 11.6–15.9)
LYMPHS PCT: 28 %
Lymphs Abs: 1.3 10*3/uL (ref 0.9–3.3)
MCH: 31.4 pg (ref 26.0–34.0)
MCHC: 32.2 g/dL (ref 32.0–36.0)
MCV: 97.4 fL (ref 81.0–101.0)
MONO ABS: 0.7 10*3/uL (ref 0.1–0.9)
MONOS PCT: 15 %
NEUTROS ABS: 2.4 10*3/uL (ref 1.5–6.5)
NEUTROS PCT: 52 %
Platelet Count: 102 10*3/uL — ABNORMAL LOW (ref 145–400)
RBC: 3.89 MIL/uL (ref 3.70–5.32)
RDW: 14.2 % (ref 11.1–15.7)
WBC Count: 4.7 10*3/uL (ref 3.9–10.0)

## 2017-09-01 LAB — SAVE SMEAR

## 2017-09-01 LAB — PLATELET BY CITRATE

## 2017-09-01 MED ORDER — FLUCONAZOLE 100 MG PO TABS: 100.0000 mg | ORAL_TABLET | Freq: Every day | ORAL | 0 refills | Status: AC

## 2017-09-01 NOTE — Progress Notes (Signed)
Hematology and Oncology Follow Up Visit  Dhrithi Riche [id number] Jul 24, 1929 84 y.o. 09/01/2017   Principle Diagnosis:   Chronic immune thrombocytopenia-mild  Current Therapy:    Observation     Interim History:  Ms.  Keeran is back for followup.  I am quite saddened by the fact that her husband passed away on 07-22-2022.  I have known him for such a long time.  I first started taking care of him and then I started taking care of her.  It is just very sad that he is no longer with this.  He was always a lot of fun to take care of.  It was fun talking to him.  She told me that he had his [facility] baseball had buried with him.  She is doing okay.  She seemed to have a fungal rash under her breast.  I will send in a prescription for Diflucan (100 mg p.o. daily x7 days) and let us see if this can help her.  She is had no bleeding.  Her platelet count is fantastic today.  This is the best that her platelet count has been for at least 2 or 3 years.  Overall, her performance status is ECOG 2-3.  Medications:  Current Outpatient Medications:  .  ALPRAZolam (XANAX) 0.5 MG tablet, TAKE 1 TABLET BY MOUTH AT BEDTIME, Disp: 30 tablet, Rfl: 0 .  amLODipine-olmesartan (AZOR) 5-20 MG tablet, TAKE 1 TABLET BY MOUTH EVERY DAY, Disp: 90 tablet, Rfl: 3 .  aspirin EC 81 MG tablet, Take 81 mg by mouth every morning., Disp: , Rfl:  .  B Complex Vitamins (VITAMIN B COMPLEX PO), Take 1 tablet by mouth every morning. , Disp: , Rfl:  .  cholecalciferol (VITAMIN D) 1000 units tablet, Take 2,000 Units by mouth daily., Disp: , Rfl:  .  docusate sodium (COLACE) 100 MG capsule, Take 1 capsule (100 mg total) by mouth 2 (two) times daily., Disp: 10 capsule, Rfl: 0 .  furosemide (LASIX) 40 MG tablet, Take 40 mg by mouth every morning. Reported on 01/28/2015, Disp: , Rfl:  .  KLOR-CON M20 20 MEQ tablet, Take 20 mEq by mouth daily., Disp: , Rfl: 4 .  Multiple Vitamin (MULTIVITAMIN WITH MINERALS) TABS, Take 1  tablet by mouth every morning., Disp: , Rfl:  .  omeprazole (PRILOSEC) 20 MG capsule, Take 20 mg by mouth every morning. , Disp: , Rfl:  .  OVER THE COUNTER MEDICATION, Nutrifuron (immune supplement) - take 1 tablet/capsule by mouth once daily., Disp: , Rfl:  .  Probiotic CAPS, Take 1 capsule by mouth daily., Disp: , Rfl:  .  simvastatin (ZOCOR) 10 MG tablet, TAKE 1 TABLET BY MOUTH EVERY DAY AT DINNER, Disp: 30 tablet, Rfl: 0  Allergies: No Known Allergies  Past Medical History, Surgical history, Social history, and Family History were reviewed and updated.  Review of Systems: As stated in the interim history  Physical Exam:  weight is 163 lb (73.9 kg). Her oral temperature is 97.8 F (36.6 C). Her blood pressure is 139/68 and her pulse is 76. Her respiration is 16 and oxygen saturation is 96%.   Elderly white female. She is obvious osteoarthritic changes in her joints. Head and neck exam shows no ocular or oral lesions. She has no adenopathy in the neck. Thyroid is not palpable. Lungs are clear. Cardiac exam regular rate and rhythm with an occasional extra beat. She has no murmurs, rubs or bruits. Abdomen is soft. She has  good bowel sounds. There is no fluid wave. There is no palpable liver or spleen tip. Back exam shows kyphosis. She has no tenderness over the spine and ribs or hips. Extremities shows marked osteophytic changes in her joints. Skin exam shows no rashes, ecchymoses or petechia. Neurological exam shows no focal neurological deficits. icits.  Lab Results  Component Value Date   WBC 4.7 09/01/2017   HGB 12.2 09/01/2017   HCT 37.9 09/01/2017   MCV 97.4 09/01/2017   PLT 102 (L) 09/01/2017     Chemistry      Component Value Date/Time   NA 140 11/11/2015 1102   K 5.0 11/11/2015 1102   CL 106 11/11/2015 1102   CO2 28 11/11/2015 1102   BUN 23 11/11/2015 1102   CREATININE 1.20 (H) 11/11/2015 1102      Component Value Date/Time   CALCIUM 10.4 11/11/2015 1102   ALKPHOS 46  10/02/2015 1044   AST 19 10/02/2015 1044   ALT 13 10/02/2015 1044   BILITOT 0.4 10/02/2015 1044         Impression and Plan: Ms. Sugrue is a 84 year old female. She has mild, non- clinical thrombocytopenia. She has never had a problem with this.  Her platelet count looks great.  I am very happy for her.  I think that we can probably get her back in 1 year now.  I know that it is getting harder for her to come to our office.  I will see her back in 1 year.    Volanda Napoleon, MD 7/19/201912:07 PM

## 2017-10-11 ENCOUNTER — Ambulatory Visit (INDEPENDENT_AMBULATORY_CARE_PROVIDER_SITE_OTHER): Payer: Medicare Other | Admitting: *Deleted

## 2017-10-11 ENCOUNTER — Telehealth: Payer: Self-pay

## 2017-10-11 DIAGNOSIS — I4819 Other persistent atrial fibrillation: Secondary | ICD-10-CM

## 2017-10-11 DIAGNOSIS — I481 Persistent atrial fibrillation: Secondary | ICD-10-CM

## 2017-10-11 NOTE — Telephone Encounter (Signed)
Spoke with pt and reminded pt of remote transmission that is due today. Pt verbalized understanding.   

## 2017-10-12 ENCOUNTER — Encounter: Payer: Self-pay | Admitting: Cardiology

## 2017-10-12 NOTE — Progress Notes (Signed)
Remote pacemaker transmission.   

## 2017-10-31 LAB — CUP PACEART REMOTE DEVICE CHECK
Battery Impedance: 4020 Ohm
Battery Remaining Longevity: 16 mo
Battery Voltage: 2.69 V
Brady Statistic RV Percent Paced: 36 %
Implantable Lead Implant Date: 20090112
Implantable Lead Location: 753859
Implantable Lead Model: 4092
Implantable Lead Model: 5076
Implantable Pulse Generator Implant Date: 20090112
Lead Channel Pacing Threshold Amplitude: 1.25 V
Lead Channel Pacing Threshold Pulse Width: 0.4 ms
Lead Channel Setting Pacing Pulse Width: 0.4 ms
MDC IDC LEAD IMPLANT DT: 20090112
MDC IDC LEAD LOCATION: 753860
MDC IDC MSMT LEADCHNL RA IMPEDANCE VALUE: 67 Ohm
MDC IDC MSMT LEADCHNL RV IMPEDANCE VALUE: 503 Ohm
MDC IDC SESS DTM: 20190828181648
MDC IDC SET LEADCHNL RV PACING AMPLITUDE: 2.75 V
MDC IDC SET LEADCHNL RV SENSING SENSITIVITY: 2 mV

## 2018-01-10 ENCOUNTER — Telehealth: Payer: Self-pay | Admitting: Cardiology

## 2018-01-10 ENCOUNTER — Ambulatory Visit (INDEPENDENT_AMBULATORY_CARE_PROVIDER_SITE_OTHER): Payer: Medicare Other

## 2018-01-10 DIAGNOSIS — I4819 Other persistent atrial fibrillation: Secondary | ICD-10-CM

## 2018-01-10 NOTE — Progress Notes (Signed)
Remote pacemaker transmission.   

## 2018-01-10 NOTE — Telephone Encounter (Signed)
Spoke with pt and reminded pt of remote transmission that is due today. Pt verbalized understanding.   

## 2018-01-18 ENCOUNTER — Encounter: Payer: Self-pay | Admitting: Cardiology

## 2018-02-11 ENCOUNTER — Observation Stay (HOSPITAL_COMMUNITY)
Admission: EM | Admit: 2018-02-11 | Discharge: 2018-02-12 | Disposition: A | Discharge status: Home / Self Care | Payer: Medicare Other | Attending: Internal Medicine | Admitting: Internal Medicine

## 2018-02-11 ENCOUNTER — Emergency Department (HOSPITAL_COMMUNITY): Payer: Medicare Other

## 2018-02-11 ENCOUNTER — Encounter (HOSPITAL_COMMUNITY): Payer: Self-pay | Admitting: Emergency Medicine

## 2018-02-11 DIAGNOSIS — F329 Major depressive disorder, single episode, unspecified: Secondary | ICD-10-CM | POA: Diagnosis not present

## 2018-02-11 DIAGNOSIS — N390 Urinary tract infection, site not specified: Secondary | ICD-10-CM | POA: Diagnosis present

## 2018-02-11 DIAGNOSIS — Y92012 Bathroom of single-family (private) house as the place of occurrence of the external cause: Secondary | ICD-10-CM | POA: Insufficient documentation

## 2018-02-11 DIAGNOSIS — I130 Hypertensive heart and chronic kidney disease with heart failure and stage 1 through stage 4 chronic kidney disease, or unspecified chronic kidney disease: Secondary | ICD-10-CM | POA: Insufficient documentation

## 2018-02-11 DIAGNOSIS — F419 Anxiety disorder, unspecified: Secondary | ICD-10-CM | POA: Diagnosis not present

## 2018-02-11 DIAGNOSIS — N183 Chronic kidney disease, stage 3 unspecified: Secondary | ICD-10-CM | POA: Diagnosis present

## 2018-02-11 DIAGNOSIS — D696 Thrombocytopenia, unspecified: Secondary | ICD-10-CM | POA: Diagnosis present

## 2018-02-11 DIAGNOSIS — Z952 Presence of prosthetic heart valve: Secondary | ICD-10-CM | POA: Insufficient documentation

## 2018-02-11 DIAGNOSIS — J180 Bronchopneumonia, unspecified organism: Secondary | ICD-10-CM | POA: Insufficient documentation

## 2018-02-11 DIAGNOSIS — B9620 Unspecified Escherichia coli [E. coli] as the cause of diseases classified elsewhere: Secondary | ICD-10-CM | POA: Diagnosis not present

## 2018-02-11 DIAGNOSIS — M8580 Other specified disorders of bone density and structure, unspecified site: Secondary | ICD-10-CM | POA: Insufficient documentation

## 2018-02-11 DIAGNOSIS — Z7982 Long term (current) use of aspirin: Secondary | ICD-10-CM | POA: Insufficient documentation

## 2018-02-11 DIAGNOSIS — I4821 Permanent atrial fibrillation: Secondary | ICD-10-CM | POA: Diagnosis present

## 2018-02-11 DIAGNOSIS — G250 Essential tremor: Secondary | ICD-10-CM | POA: Diagnosis not present

## 2018-02-11 DIAGNOSIS — W19XXXA Unspecified fall, initial encounter: Secondary | ICD-10-CM | POA: Diagnosis present

## 2018-02-11 DIAGNOSIS — K219 Gastro-esophageal reflux disease without esophagitis: Secondary | ICD-10-CM | POA: Diagnosis not present

## 2018-02-11 DIAGNOSIS — Z950 Presence of cardiac pacemaker: Secondary | ICD-10-CM | POA: Diagnosis not present

## 2018-02-11 DIAGNOSIS — W0110XA Fall on same level from slipping, tripping and stumbling with subsequent striking against unspecified object, initial encounter: Secondary | ICD-10-CM | POA: Diagnosis not present

## 2018-02-11 DIAGNOSIS — N3000 Acute cystitis without hematuria: Principal | ICD-10-CM | POA: Insufficient documentation

## 2018-02-11 DIAGNOSIS — I872 Venous insufficiency (chronic) (peripheral): Secondary | ICD-10-CM | POA: Insufficient documentation

## 2018-02-11 DIAGNOSIS — I5032 Chronic diastolic (congestive) heart failure: Secondary | ICD-10-CM | POA: Diagnosis not present

## 2018-02-11 DIAGNOSIS — E785 Hyperlipidemia, unspecified: Secondary | ICD-10-CM | POA: Diagnosis present

## 2018-02-11 DIAGNOSIS — S0003XA Contusion of scalp, initial encounter: Secondary | ICD-10-CM | POA: Diagnosis not present

## 2018-02-11 DIAGNOSIS — J189 Pneumonia, unspecified organism: Secondary | ICD-10-CM

## 2018-02-11 DIAGNOSIS — J181 Lobar pneumonia, unspecified organism: Secondary | ICD-10-CM | POA: Diagnosis present

## 2018-02-11 DIAGNOSIS — I10 Essential (primary) hypertension: Secondary | ICD-10-CM | POA: Diagnosis present

## 2018-02-11 LAB — URINALYSIS, ROUTINE W REFLEX MICROSCOPIC
Bilirubin Urine: NEGATIVE
Glucose, UA: NEGATIVE mg/dL
HGB URINE DIPSTICK: NEGATIVE
Ketones, ur: NEGATIVE mg/dL
Nitrite: NEGATIVE
PROTEIN: NEGATIVE mg/dL
Specific Gravity, Urine: 1.009 (ref 1.005–1.030)
pH: 5 (ref 5.0–8.0)

## 2018-02-11 LAB — CBC
HCT: 37.7 % (ref 36.0–46.0)
HEMOGLOBIN: 12.1 g/dL (ref 12.0–15.0)
MCH: 31.8 pg (ref 26.0–34.0)
MCHC: 32.1 g/dL (ref 30.0–36.0)
MCV: 99.2 fL (ref 80.0–100.0)
Platelets: 90 10*3/uL — ABNORMAL LOW (ref 150–400)
RBC: 3.8 MIL/uL — ABNORMAL LOW (ref 3.87–5.11)
RDW: 14.6 % (ref 11.5–15.5)
WBC: 6.6 10*3/uL (ref 4.0–10.5)
nRBC: 0 % (ref 0.0–0.2)

## 2018-02-11 LAB — BASIC METABOLIC PANEL
Anion gap: 8 (ref 5–15)
BUN: 30 mg/dL — ABNORMAL HIGH (ref 8–23)
CO2: 26 mmol/L (ref 22–32)
Calcium: 10.7 mg/dL — ABNORMAL HIGH (ref 8.9–10.3)
Chloride: 105 mmol/L (ref 98–111)
Creatinine, Ser: 1.39 mg/dL — ABNORMAL HIGH (ref 0.44–1.00)
GFR calc Af Amer: 40 mL/min — ABNORMAL LOW (ref >60)
GFR, EST NON AFRICAN AMERICAN: 34 mL/min — AB (ref >60)
GLUCOSE: 140 mg/dL — AB (ref 70–99)
Potassium: 5.1 mmol/L (ref 3.5–5.1)
Sodium: 139 mmol/L (ref 135–145)

## 2018-02-11 LAB — TROPONIN I
Troponin I: <0.03 ng/mL (ref <0.03)
Troponin I: <0.03 ng/mL (ref <0.03)

## 2018-02-11 LAB — BRAIN NATRIURETIC PEPTIDE: B Natriuretic Peptide: 79.2 pg/mL (ref 0.0–100.0)

## 2018-02-11 LAB — I-STAT TROPONIN, ED: Troponin i, poc: 0.01 ng/mL (ref 0.00–0.08)

## 2018-02-11 LAB — GLUCOSE, CAPILLARY: Glucose-Capillary: 98 mg/dL (ref 70–99)

## 2018-02-11 MED ORDER — ONDANSETRON HCL 4 MG/2ML IJ SOLN: 4.0000 mg | Freq: Four times a day (QID) | INTRAMUSCULAR | Status: DC | PRN

## 2018-02-11 MED ORDER — SIMVASTATIN 20 MG PO TABS
10.0000 mg | ORAL_TABLET | Freq: Every day | ORAL | Status: DC
  Administered 2018-02-11: 10 mg via ORAL
  Filled 2018-02-11: qty 1

## 2018-02-11 MED ORDER — B COMPLEX-C PO TABS
1.0000 | ORAL_TABLET | Freq: Every day | ORAL | Status: DC
  Administered 2018-02-11 – 2018-02-12 (×2): 1 via ORAL
  Filled 2018-02-11 (×2): qty 1

## 2018-02-11 MED ORDER — IRBESARTAN 300 MG PO TABS
150.0000 mg | ORAL_TABLET | Freq: Every day | ORAL | Status: DC
  Administered 2018-02-11: 150 mg via ORAL
  Filled 2018-02-11: qty 1

## 2018-02-11 MED ORDER — SODIUM CHLORIDE 0.9 % IV BOLUS
500.0000 mL | Freq: Once | INTRAVENOUS | Status: AC
  Administered 2018-02-11: 500 mL via INTRAVENOUS

## 2018-02-11 MED ORDER — DOXYCYCLINE HYCLATE 100 MG PO TABS
100.0000 mg | ORAL_TABLET | Freq: Once | ORAL | Status: AC
  Administered 2018-02-11: 100 mg via ORAL
  Filled 2018-02-11: qty 1

## 2018-02-11 MED ORDER — ACETAMINOPHEN 325 MG PO TABS: 650.0000 mg | ORAL_TABLET | Freq: Four times a day (QID) | ORAL | Status: DC | PRN

## 2018-02-11 MED ORDER — DEXTROSE-NACL 5-0.9 % IV SOLN
INTRAVENOUS | Status: DC
  Administered 2018-02-11: 75 mL/h via INTRAVENOUS
  Filled 2018-02-11: qty 1000

## 2018-02-11 MED ORDER — SODIUM CHLORIDE 0.9 % IV SOLN
1.0000 g | INTRAVENOUS | Status: DC
  Administered 2018-02-12: 1 g via INTRAVENOUS
  Filled 2018-02-11: qty 10

## 2018-02-11 MED ORDER — PANTOPRAZOLE SODIUM 40 MG PO TBEC
40.0000 mg | DELAYED_RELEASE_TABLET | Freq: Every day | ORAL | Status: DC
  Administered 2018-02-11 – 2018-02-12 (×2): 40 mg via ORAL
  Filled 2018-02-11 (×2): qty 1

## 2018-02-11 MED ORDER — ADULT MULTIVITAMIN W/MINERALS CH
1.0000 | ORAL_TABLET | Freq: Every day | ORAL | Status: DC
  Administered 2018-02-11 – 2018-02-12 (×2): 1 via ORAL
  Filled 2018-02-11 (×2): qty 1

## 2018-02-11 MED ORDER — AMLODIPINE BESYLATE 2.5 MG PO TABS
5.0000 mg | ORAL_TABLET | Freq: Every day | ORAL | Status: DC
  Administered 2018-02-11: 5 mg via ORAL
  Filled 2018-02-11: qty 2

## 2018-02-11 MED ORDER — FUROSEMIDE 20 MG PO TABS: 40.0000 mg | ORAL_TABLET | Freq: Every day | ORAL | Status: DC

## 2018-02-11 MED ORDER — ONDANSETRON HCL 4 MG PO TABS: 4.0000 mg | ORAL_TABLET | Freq: Four times a day (QID) | ORAL | Status: DC | PRN

## 2018-02-11 MED ORDER — ALPRAZOLAM 0.5 MG PO TABS: 0.5000 mg | ORAL_TABLET | Freq: Every day | ORAL | Status: DC

## 2018-02-11 MED ORDER — SODIUM CHLORIDE 0.9 % IV SOLN
1.0000 g | Freq: Once | INTRAVENOUS | Status: AC
  Administered 2018-02-11: 1 g via INTRAVENOUS
  Filled 2018-02-11: qty 10

## 2018-02-11 MED ORDER — HYDRALAZINE HCL 20 MG/ML IJ SOLN: 5.0000 mg | INTRAMUSCULAR | Status: DC | PRN

## 2018-02-11 MED ORDER — ACETAMINOPHEN 650 MG RE SUPP: 650.0000 mg | Freq: Four times a day (QID) | RECTAL | Status: DC | PRN

## 2018-02-11 MED ORDER — ZOLPIDEM TARTRATE 5 MG PO TABS: 5.0000 mg | ORAL_TABLET | Freq: Every evening | ORAL | Status: DC | PRN

## 2018-02-11 MED ORDER — LORAZEPAM 2 MG/ML IJ SOLN
0.5000 mg | Freq: Once | INTRAMUSCULAR | Status: AC
  Administered 2018-02-11: 0.5 mg via INTRAVENOUS
  Filled 2018-02-11: qty 1

## 2018-02-11 MED ORDER — RISAQUAD PO CAPS
1.0000 | ORAL_CAPSULE | Freq: Every day | ORAL | Status: DC
  Administered 2018-02-11 – 2018-02-12 (×2): 1 via ORAL
  Filled 2018-02-11 (×2): qty 1

## 2018-02-11 MED ORDER — SENNOSIDES-DOCUSATE SODIUM 8.6-50 MG PO TABS: 1.0000 | ORAL_TABLET | Freq: Every evening | ORAL | Status: DC | PRN

## 2018-02-11 MED ORDER — AMLODIPINE-OLMESARTAN 5-20 MG PO TABS: 1.0000 | ORAL_TABLET | Freq: Every evening | ORAL | Status: DC

## 2018-02-11 MED ORDER — ASPIRIN EC 81 MG PO TBEC
81.0000 mg | DELAYED_RELEASE_TABLET | Freq: Every day | ORAL | Status: DC
  Administered 2018-02-11 – 2018-02-12 (×2): 81 mg via ORAL
  Filled 2018-02-11 (×2): qty 1

## 2018-02-11 NOTE — Evaluation (Signed)
Physical Therapy Evaluation Patient Details Name: [name] MRN: [id number] DOB: [birth year] Today's Date: 02/11/2018   History of Present Illness  Pt adm with fall and UTI. PMH - chf, htn, pacer, afib, mvr, anxiety, tremor,   Clinical Impression  Pt admitted with above diagnosis and presents to PT with functional limitations due to deficits listed below (See PT problem list). Pt needs skilled PT to maximize independence and safety to allow discharge to home with family. Per daughter pt is not too far from her baseline and son is at home most of the time.      Follow Up Recommendations Home health PT;Supervision for mobility/OOB    Equipment Recommendations  None recommended by PT    Recommendations for Other Services       Precautions / Restrictions Precautions Precautions: Fall Restrictions Weight Bearing Restrictions: No      Mobility  Bed Mobility               General bed mobility comments: Pt sitting EOB  Transfers Overall transfer level: Needs assistance Equipment used: Rolling walker (2 wheeled) Transfers: Sit to/from [facility] Sit to Stand: Min assist Stand pivot transfers: Min assist       General transfer comment: Assist for balance  Ambulation/Gait Ambulation/Gait assistance: Min guard Gait Distance (Feet): 125 Feet Assistive device: Rolling walker (2 wheeled) Gait Pattern/deviations: Step-through pattern;Decreased stride length;Trunk flexed Gait velocity: decr Gait velocity interpretation: 1.31 - 2.62 ft/sec, indicative of limited community ambulator General Gait Details: Assist for safety. Pt tremulous (baseline) but no loss of balance.  Stairs            Wheelchair Mobility    Modified Rankin (Stroke Patients Only)       Balance Overall balance assessment: Needs assistance Sitting-balance support: No upper extremity supported;Feet supported Sitting balance-Leahy Scale: Fair     Standing balance  support: Single extremity supported Standing balance-Leahy Scale: Poor Standing balance comment: UE support and supervision for static standing                             Pertinent Vitals/Pain Pain Assessment: No/denies pain    Home Living Family/patient expects to be discharged to:: Private residence Living Arrangements: Children(son) Available Help at Discharge: Family(son home most of the time) Type of Home: House Home Access: Stairs to enter Entrance Stairs-Rails: None Entrance Stairs-Number of Steps: 2-3 Home Layout: Multi-level;Bed/bath upstairs Home Equipment: Environmental consultant - 2 wheels;Cane - single point      Prior Function Level of Independence: Needs assistance   Gait / Transfers Assistance Needed: amb with walker or rollator or furniture walking in the house            Hand Dominance   Dominant Hand: Right    Extremity/Trunk Assessment   Upper Extremity Assessment Upper Extremity Assessment: Defer to OT evaluation    Lower Extremity Assessment Lower Extremity Assessment: Generalized weakness       Communication   Communication: No difficulties  Cognition Arousal/Alertness: Awake/alert Behavior During Therapy: WFL for tasks assessed/performed Overall Cognitive Status: Within Functional Limits for tasks assessed                                        General Comments      Exercises     Assessment/Plan    PT Assessment Patient needs continued  PT services  PT Problem List Decreased strength;Decreased activity tolerance;Decreased balance;Decreased mobility       PT Treatment Interventions DME instruction;Gait training;Therapeutic activities;Functional mobility training;Stair training;Therapeutic exercise;Balance training;Patient/family education    PT Goals (Current goals can be found in the Care Plan section)  Acute Rehab PT Goals Patient Stated Goal: return home PT Goal Formulation: With patient Time For Goal  Achievement: 02/25/18 Potential to Achieve Goals: Good    Frequency Min 3X/week   Barriers to discharge Inaccessible home environment split level home    Co-evaluation               AM-PAC PT "6 Clicks" Mobility  Outcome Measure Help needed turning from your back to your side while in a flat bed without using bedrails?: None Help needed moving from lying on your back to sitting on the side of a flat bed without using bedrails?: A Little Help needed moving to and from a bed to a chair (including a wheelchair)?: A Little Help needed standing up from a chair using your arms (e.g., wheelchair or bedside chair)?: A Little Help needed to walk in hospital room?: A Little Help needed climbing 3-5 steps with a railing? : A Little 6 Click Score: 19    End of Session Equipment Utilized During Treatment: Gait belt Activity Tolerance: Patient tolerated treatment well Patient left: in chair;with call bell/phone within reach;with chair alarm set Nurse Communication: Mobility status PT Visit Diagnosis: Unsteadiness on feet (R26.81);Muscle weakness (generalized) (M62.81);History of falling (Z91.81)    Time: 7654-6503 PT Time Calculation (min) (ACUTE ONLY): 31 min   Charges:   PT Evaluation $PT Eval Moderate Complexity: 1 Mod PT Treatments $Gait Training: 8-22 mins        [place] Pager [phone] Office [place] 02/11/2018, 4:45 PM

## 2018-02-11 NOTE — Progress Notes (Signed)
Patient's family offer multiple complaints.  Upset re:  Patient not having eaten for 12 - 48 hours.  They are also demanding that SLP come in to see and evaluate her "immediately" as her swallowing problem requires a diagnosis.  Patient was observed to have difficulty swallowing in the ED, but this was not observed here.  I gave her a glass of water with her medications, with which she had no difficulty.  Her daughter then called me into the room as her mother had "vomited" into the garbage can.  This was observed to be water only.  ? If emesis was sparked by coughing episode, but this was not observed by Probation officer.  Patient noted to be very anxious and an increase in her generalized tremor was noted.  States she is wanting Xanax for anxiety and for sleep.  States she takes this at home.  1-2+ BLE edema present.  Lungs clear TA.  Family had requested that patient have something to eat prior to leaving the ED, but on arrival to this unit, the daughter became quite distraught, stating she was uncomfortable with her mother eating prior to being seen by the SLP.  MD notified and orders received for IV fluids and NPO status.  Other family members upset that patient will become suddenly weaker from lack of food "over the past 3 days".  They stated that this was "unacceptable".  Attempted to put family member's minds at ease with emotional support and assurances that patient would be seen as ordered by Speech Therapy and the team would provide the best care for her possible.

## 2018-02-11 NOTE — Progress Notes (Signed)
Patient is 84 year old female with past medical history of hypertension, hyperlipidemia, GERD, anxiety, chronic thrombocytopenia, CHF, atrial fibrillation not on anticoagulation, CKD stage III, mitral valve repair with bioprosthetic valve, pacemaker placement secondary to sick sinus syndrome who presented to the emergency department after she fell at home.  Reported that she hit her head on the shower door.  Denies any loss of consciousness.  Presumed to be mechanical fall. Urinalysis suggested urinary tract infection.  Started on ceftriaxone.  Admitted for PT/OT evaluation. Patient seen and examined the bedside in the emergency department.  Currently she is hemodynamically stable.  Denies any chest pain, cough or shortness of breath.  Denies any history of dysuria . Had some issues with swallowing when she was in the emergency department.  We have requested for speech therapy evaluation. Most likely patient will need skilled nursing facility on discharge. Patient seen by Dr. Blaine Hamper this morning.

## 2018-02-11 NOTE — ED Provider Notes (Signed)
[facility] EMERGENCY DEPARTMENT Provider Note   CSN: [id number] Arrival date & time: 02/11/18  0224     History   Chief Complaint Chief Complaint  Patient presents with  . Fall    HPI [name] is a 84 y.o. female.  [name] is a 84 y.o. female with a history of sick sinus syndrome s/p pacemaker, hypertension, hyperlipidemia, CHF, pneumonia, tonic thrombocytopenia, who presents to the emergency department for evaluation after fall.  Patient reports she is not sure exactly why she fell but just prior to arrival she was in her bathroom when she seemed to slip and fall backwards hitting her head on the shower door.  She does not think she lost consciousness but does report feeling lightheaded and a bit dizzy.  She reports since hitting her head she has had a headache, no vision changes, some nausea but no vomiting.  She denies any focal numbness, weakness or tingling in her extremities.  Reports since the fall she is also had some intermittent left-sided chest discomfort without shortness of breath.  No fevers.  Does report occasional cough.  Patient is also reporting some pain over her right shoulder, she has chronic arthritis in the shoulder and received steroid injections, reports when EMS lifted her up off the floor they pulled up on her shoulder causing worsening pain in this area.  Denies any abdominal pain, nausea or vomiting.  Does report continuing to feel intermittently lightheaded when moving around.  Denies any pain over her hips or other focal extremities.  No abrasions or lacerations noted.     Past Medical History:  Diagnosis Date  . Anxiety   . Arthritis    "mostly my hands" (01/29/2015)  . Basal cell carcinoma of forehead   . Benign essential tremor   . CAP (community acquired pneumonia) 01/29/2015  . CHF (congestive heart failure) ([place])   . Depression   . Edema of both legs 04-23-2013  pt states feet swelling (wears ted hose)   bilateral venous ablation procedures done in IR, L>R  . GERD (gastroesophageal reflux disease)   . H/O hiatal hernia   . Hyperlipidemia   . Hyperparathyroidism ([place])    MILD--  STABLE  . Hypertension   . Kidney stones   . Osteopenia   . Permanent atrial fibrillation    CARDIOLOGIST-- DR RDEYCXKG  . Pneumonia "several times"  . Presence of permanent cardiac pacemaker    LAST PACER CHECK  04-23-2013  IN [facility]  . Right ureteral stone   . S/P mitral valve replacement with bioprosthetic valve    04-03-2009  . Schatzki's ring   . Sick sinus syndrome ([place])    S/P  PACEMAKER  2009  . Thrombocytopenia, immune ([place])    CHRONIC---  HEMOTOLOGIST---  DR ENNEVER  . Venous insufficiency, peripheral   . Wears glasses     Patient Active Problem List   Diagnosis Date Noted  . Need for prophylactic vaccination and inoculation against influenza 11/19/2015  . Acute encephalopathy 01/31/2015  . Pneumonia, community acquired 01/28/2015  . PNA (pneumonia) 01/28/2015  . Pacemaker 05/06/2014  . Bilateral leg edema 08/20/2013  . S/P mitral valve replacement with bioprosthetic valve - 29 mm Biocor 04/22/2013  . Permanent atrial fibrillation 04/22/2013  . Essential hypertension 04/22/2013  . Hyperlipidemia 04/22/2013  . Hypercalcemia 11/28/2012  . Ureteral stone 08/02/2012  . Acute renal failure ([place]) 08/02/2012  . Thrombocytopenia ([place]) 01/20/2011  . Chronic diastolic congestive heart failure ([place])  04/04/2007  . BRONCHOPNEUMONIA ORGANISM UNSPECIFIED 03/13/2007    Past Surgical History:  Procedure Laterality Date  . CARDIAC CATHETERIZATION  03/27/2009   normal coronaries; grade IV Mitral Insuff.  Marland Kitchen CARDIAC CATHETERIZATION  2001  &  2006  . CARDIAC VALVE REPLACEMENT    . CATARACT EXTRACTION W/ INTRAOCULAR LENS  IMPLANT, BILATERAL Bilateral ~ 2012  . CYSTOSCOPY W/ URETERAL STENT PLACEMENT Left 08/01/2012   Procedure: CYSTOSCOPY WITH RETROGRADE PYELOGRAM/URETERAL STENT PLACEMENT;  Surgeon: Dutch Gray, MD;  Location: [facility];  Service: Urology;  Laterality: Left;  . CYSTOSCOPY WITH URETEROSCOPY Left 08/14/2012   Procedure: LEFT URETEROSCOPY WITH HOLMIUM LASER AND LEFT  STENT PLACEMENT;  Surgeon: Malka So, MD;  Location: [facility];  Service: Urology;  Laterality: Left;  . CYSTOSCOPY WITH URETEROSCOPY AND STENT PLACEMENT Right 04/25/2013   Procedure: RIGHT URETEROSCOPY WITH  STENT PLACEMENT;  Surgeon: Irine Seal, MD;  Location: [facility];  Service: Urology;  Laterality: Right;  . ESOPHAGOGASTRODUODENOSCOPY (EGD) WITH ESOPHAGEAL DILATION  "a couple times" (01/29/2015)  . EXTRACORPOREAL SHOCK WAVE LITHOTRIPSY Left 07-26-2012  . HAND SURGERY Right 05/2011   "no fracture; straightened things up  . HAND SURGERY Left 2014   "no fracture; straightened things up"  . HOLMIUM LASER APPLICATION Left 04/15/9516   Procedure:  HOLMIUM LASER APPLICATION;  Surgeon: Malka So, MD;  Location: [facility];  Service: Urology;  Laterality: Left;  . HOLMIUM LASER APPLICATION Right 8/41/6606   Procedure: HOLMIUM LASER APPLICATION;  Surgeon: Irine Seal, MD;  Location: [facility];  Service: Urology;  Laterality: Right;  . MITRAL VALVE REPLACEMENT WITH CHORDAL PRESEVATION USING BIOPROSTHETIC VALVE/  LIGATION LEFT ATRIAL APPENDAGE/  LEFT-SIDED MAZE PROCEDURE  04-03-2009  DR Ethel Rana,  SERIAL # [id number]  . NM MYOCAR PERF WALL MOTION  03/01/1999   mild lateral ischemia  . PERMANENT PACEMAKER INSERTION  02/26/2007   Dual Chamber Medtronic Programmed VVIR--- Generator [id number]/  #[id number] H   . TRANSTHORACIC ECHOCARDIOGRAM  09-07-2012  DR CROITORU   MILD LVH/  EF 55-60%/  MILD TO MODERATE AR/   SEVERE LAE  &  RAE/  MILD TO MODERATE TR/   MITRAL VALVE BIOPROSTHESIS LEAFLET NORMAL,  MODERATE STENOSIS WITH NO SIG. REGURG/  MODERATE RVE  . VAGINAL HYSTERECTOMY  1982     OB History   No obstetric history on file.      Home Medications    Prior to Admission medications     Medication Sig Start Date End Date Taking? Authorizing Provider  ALPRAZolam Duanne Moron) 0.5 MG tablet TAKE 1 TABLET BY MOUTH AT BEDTIME 03/27/15   Gildardo Cranker, DO  amLODipine-olmesartan (AZOR) 5-20 MG tablet TAKE 1 TABLET BY MOUTH EVERY DAY 04/12/17   Croitoru, Mihai, MD  aspirin EC 81 MG tablet Take 81 mg by mouth every morning.    [provider]  B Complex Vitamins (VITAMIN B COMPLEX PO) Take 1 tablet by mouth every morning.     [provider]  cholecalciferol (VITAMIN D) 1000 units tablet Take 2,000 Units by mouth daily.    [provider]  docusate sodium (COLACE) 100 MG capsule Take 1 capsule (100 mg total) by mouth 2 (two) times daily. 02/04/15   Regalado, Belkys A, MD  furosemide (LASIX) 40 MG tablet Take 40 mg by mouth every morning. Reported on 01/28/2015    [provider]  KLOR-CON M20 20 MEQ tablet Take 20 mEq by mouth daily. 04/15/15   [provider]  Multiple Vitamin (MULTIVITAMIN WITH MINERALS) TABS Take 1 tablet by mouth every morning.    [provider]  omeprazole (PRILOSEC) 20 MG capsule Take 20 mg by mouth every morning.     [provider]  OVER THE COUNTER MEDICATION Nutrifuron (immune supplement) - take 1 tablet/capsule by mouth once daily.    [provider]  Probiotic CAPS Take 1 capsule by mouth daily.    [provider]  simvastatin (ZOCOR) 10 MG tablet TAKE 1 TABLET BY MOUTH EVERY DAY AT [facility] 03/30/15   Medina-Vargas, Monina C, NP    Family History Family History  Problem Relation Age of Onset  . Cancer Mother   . Heart attack Father     Social History Social History   Tobacco Use  . Smoking status: Never Smoker  . Smokeless tobacco: Never Used  . Tobacco comment: never used tobacco  Substance Use Topics  . Alcohol use: No    Alcohol/week: 0.0 standard drinks  . Drug use: No     Allergies   Patient has no known allergies.   Review of Systems Review of Systems   Constitutional: Negative for chills and fever.  HENT: Negative.   Eyes: Negative for visual disturbance.  Respiratory: Positive for cough. Negative for shortness of breath.   Cardiovascular: Positive for chest pain.  Gastrointestinal: Negative for abdominal pain, diarrhea, nausea and vomiting.  Genitourinary: Negative for dysuria and frequency.  Musculoskeletal: Negative for arthralgias, back pain, joint swelling, myalgias and neck pain.  Skin: Negative for color change and rash.  Neurological: Positive for dizziness, light-headedness and headaches. Negative for syncope, facial asymmetry, speech difficulty and numbness.     Physical Exam Updated Vital Signs BP (!) 100/55   Pulse 83   Temp 98.5 F (36.9 C) (Oral)   Resp 17   Ht 5\' 4"  (1.626 m)   Wt 72.6 kg   SpO2 97%   BMI 27.46 kg/m   Physical Exam Vitals signs and nursing note reviewed.  Constitutional:      General: She is not in acute distress.    Appearance: Normal appearance. She is well-developed and normal weight. She is not ill-appearing or diaphoretic.  HENT:     Head: Normocephalic.     Comments: Mild tenderness over the occiput without palpable defmority    Mouth/Throat:     Mouth: Mucous membranes are moist.     Pharynx: Oropharynx is clear.  Eyes:     General:        Right eye: No discharge.        Left eye: No discharge.     Extraocular Movements: Extraocular movements intact.     Pupils: Pupils are equal, round, and reactive to light.  Neck:     Musculoskeletal: Neck supple. No muscular tenderness.     Comments: C-spine NTTP Cardiovascular:     Rate and Rhythm: Normal rate and regular rhythm.     Pulses: Normal pulses.     Heart sounds: Normal heart sounds. No murmur. No friction rub. No gallop.   Pulmonary:     Effort: Pulmonary effort is normal. No respiratory distress.     Breath sounds: Normal breath sounds. No wheezing or rales.     Comments: Respirations equal and unlabored, patient able to  speak in full sentences, lungs clear to auscultation bilaterally, no tenderness over chest wall Chest:     Chest wall: No tenderness.  Abdominal:     General: Abdomen is flat. Bowel sounds  are normal. There is no distension.     Palpations: Abdomen is soft. There is no mass.     Tenderness: There is no abdominal tenderness. There is no guarding.     Comments: Abdomen soft, nondistended, nontender to palpation in all quadrants without guarding or peritoneal signs  Musculoskeletal:        General: No deformity.     Comments: No midline spinal tenderness Pt report some pain over right shoulder, no palpable deformity, and ROM intact with some pain, 2+ radial pulse, sensation and strength intact  Skin:    General: Skin is warm and dry.     Capillary Refill: Capillary refill takes less than 2 seconds.  Neurological:     Mental Status: She is alert and oriented to person, place, and time. Mental status is at baseline.     Coordination: Coordination normal.     Comments: Speech is clear, able to follow commands CN III-XII intact Normal strength in upper and lower extremities bilaterally including dorsiflexion and plantar flexion, strong and equal grip strength Sensation normal to light and sharp touch Moves extremities without ataxia, coordination intact  Psychiatric:        Mood and Affect: Mood normal.        Behavior: Behavior normal.      ED Treatments / Results  Labs (all labs ordered are listed, but only abnormal results are displayed) Labs Reviewed  BASIC METABOLIC PANEL - Abnormal; Notable for the following components:      Result Value   Glucose, Bld 140 (*)    BUN 30 (*)    Creatinine, Ser 1.39 (*)    Calcium 10.7 (*)    GFR calc non Af Amer 34 (*)    GFR calc Af Amer 40 (*)    All other components within normal limits  CBC - Abnormal; Notable for the following components:   RBC 3.80 (*)    Platelets 90 (*)    All other components within normal limits  URINALYSIS,  ROUTINE W REFLEX MICROSCOPIC - Abnormal; Notable for the following components:   APPearance HAZY (*)    Leukocytes, UA SMALL (*)    Bacteria, UA RARE (*)    All other components within normal limits  URINE CULTURE  CULTURE, BLOOD (ROUTINE X 2)  CULTURE, BLOOD (ROUTINE X 2)  TROPONIN I  TROPONIN I  TROPONIN I  BRAIN NATRIURETIC PEPTIDE  I-STAT TROPONIN, ED    EKG None  Radiology Dg Chest 2 View  Result Date: 02/11/2018 CLINICAL DATA:  Status post fall, with concern for chest injury. Initial encounter. EXAM: CHEST - 2 VIEW COMPARISON:  Chest radiograph performed 04/15/2015 FINDINGS: The lungs are well-aerated; retrocardiac airspace opacity may reflect atelectasis or pneumonia. There is no evidence of pleural effusion or pneumothorax. The heart is borderline normal in size. The patient is status post median sternotomy. A pacemaker is noted at the left chest wall, with leads ending at the right atrium and right ventricle. No acute osseous abnormalities are seen. IMPRESSION: Retrocardiac airspace opacity may reflect atelectasis or pneumonia. No displaced rib fracture seen. Electronically Signed   By: Garald Balding M.D.   On: 02/11/2018 04:14   Dg Shoulder Right  Result Date: 02/11/2018 CLINICAL DATA:  Status post fall, with chronic right shoulder pain. Initial encounter. EXAM: RIGHT SHOULDER - 2+ VIEW COMPARISON:  Right shoulder radiographs performed 10/18/2017 FINDINGS: There is no evidence of fracture or dislocation. The right humeral head is seated within the glenoid fossa. Degenerative  change is noted at the right acromioclavicular joint, with mild inferior spurring. No significant soft tissue abnormalities are seen. The visualized portions of the right lung are grossly clear. IMPRESSION: No evidence of fracture or dislocation. Degenerative change again noted at the right acromioclavicular joint. Electronically Signed   By: Garald Balding M.D.   On: 02/11/2018 04:12   Ct Head Wo  Contrast  Result Date: 02/11/2018 CLINICAL DATA:  Fall with head trauma. EXAM: CT HEAD WITHOUT CONTRAST CT CERVICAL SPINE WITHOUT CONTRAST TECHNIQUE: Multidetector CT imaging of the head and cervical spine was performed following the standard protocol without intravenous contrast. Multiplanar CT image reconstructions of the cervical spine were also generated. COMPARISON:  None. FINDINGS: CT HEAD FINDINGS Brain: There is no mass, hemorrhage or extra-axial collection. There is generalized atrophy without lobar predilection. There is marked cerebellar atrophy. There is hypoattenuation of the periventricular white matter, most commonly indicating chronic ischemic microangiopathy. Vascular: No abnormal hyperdensity of the major intracranial arteries or dural venous sinuses. No intracranial atherosclerosis. Skull: Superior right scalp hematoma.  No skull fracture. Sinuses/Orbits: No fluid levels or advanced mucosal thickening of the visualized paranasal sinuses. No mastoid or middle ear effusion. The orbits are normal. CT CERVICAL SPINE FINDINGS Alignment: No static subluxation. Facets are aligned. Occipital condyles are normally positioned. Skull base and vertebrae: No acute fracture. Soft tissues and spinal canal: No prevertebral fluid or swelling. No visible canal hematoma. Disc levels: No advanced spinal canal or neural foraminal stenosis. Upper chest: No pneumothorax, pulmonary nodule or pleural effusion. Other: Normal visualized paraspinal cervical soft tissues. IMPRESSION: 1. No acute intracranial abnormality. 2. Superior right scalp hematoma without skull fracture. 3. No acute fracture or static subluxation of the cervical spine. Electronically Signed   By: Ulyses Jarred M.D.   On: 02/11/2018 04:12   Ct Cervical Spine Wo Contrast  Result Date: 02/11/2018 CLINICAL DATA:  Fall with head trauma. EXAM: CT HEAD WITHOUT CONTRAST CT CERVICAL SPINE WITHOUT CONTRAST TECHNIQUE: Multidetector CT imaging of the head  and cervical spine was performed following the standard protocol without intravenous contrast. Multiplanar CT image reconstructions of the cervical spine were also generated. COMPARISON:  None. FINDINGS: CT HEAD FINDINGS Brain: There is no mass, hemorrhage or extra-axial collection. There is generalized atrophy without lobar predilection. There is marked cerebellar atrophy. There is hypoattenuation of the periventricular white matter, most commonly indicating chronic ischemic microangiopathy. Vascular: No abnormal hyperdensity of the major intracranial arteries or dural venous sinuses. No intracranial atherosclerosis. Skull: Superior right scalp hematoma.  No skull fracture. Sinuses/Orbits: No fluid levels or advanced mucosal thickening of the visualized paranasal sinuses. No mastoid or middle ear effusion. The orbits are normal. CT CERVICAL SPINE FINDINGS Alignment: No static subluxation. Facets are aligned. Occipital condyles are normally positioned. Skull base and vertebrae: No acute fracture. Soft tissues and spinal canal: No prevertebral fluid or swelling. No visible canal hematoma. Disc levels: No advanced spinal canal or neural foraminal stenosis. Upper chest: No pneumothorax, pulmonary nodule or pleural effusion. Other: Normal visualized paraspinal cervical soft tissues. IMPRESSION: 1. No acute intracranial abnormality. 2. Superior right scalp hematoma without skull fracture. 3. No acute fracture or static subluxation of the cervical spine. Electronically Signed   By: Ulyses Jarred M.D.   On: 02/11/2018 04:12    Procedures Procedures (including critical care time)  Medications Ordered in ED Medications  cefTRIAXone (ROCEPHIN) 1 g in sodium chloride 0.9 % 100 mL IVPB (0 g Intravenous Stopped 02/11/18 0612)  doxycycline (VIBRA-TABS) tablet 100 mg (  100 mg Oral Given 02/11/18 0542)  sodium chloride 0.9 % bolus 500 mL (500 mLs Intravenous New Bag/Given 02/11/18 0541)     Initial Impression /  Assessment and Plan / ED Course  I have reviewed the triage vital signs and the nursing notes.  Pertinent labs & imaging results that were available during my care of the patient were reviewed by me and considered in my medical decision making (see chart for details).  84 year old female presents for evaluation after fall.  Patient is unsure what caused the fall.  She did strike the back of her head on the shower door, no loss of consciousness.  Patient complaining of headache, dizziness and nausea after head injury.  No focal neurologic deficits noted on exam, patient not on blood thinners.  Patient also reporting some intermittent left-sided chest pains since the fall, no injury or trauma to the chest.  Patient also reporting right shoulder pain, history of severe arthritis in the right shoulder, but worsening pain after being lifted off the ground by EMS.  Will get CT of the head and neck, x-rays of the right shoulder as well as EKG, troponin, basic labs, urinalysis and chest x-ray.  CT of the head neck shows a superior right scalp hematoma with no evidence of skull fracture and no intracranial abnormality.  No traumatic fracture or malalignment of the cervical spine.  EKG without concerning ischemic changes and troponin negative.  Chest x-ray shows possible retrocardiac opacity, concerning for pneumonia.  Labs show no leukocytosis and normal hemoglobin, patient does have a slight increase in her creatinine at 1.39, but no other acute electrolyte derangements requiring intervention.  Urinalysis with positive leukocytes, WBCs and rare bacteria present concerning for potential infection, urine culture collected.  Will start patient on Rocephin and doxycycline which will cover for both pneumonia and UTI.  X-ray of the shoulder shows chronic arthritis with no acute abnormality.  Given fall with unknown cause and multiple potential infections occurring feel patient would benefit from admission and continued  IV antibiotics, will discuss with hospitalist for admission.  Case discussed with Dr. Blaine Hamper who will see and admit the patient.  Patient discussed with Dr. Ellender Hose, who saw patient as well and agrees with plan.   Final Clinical Impressions(s) / ED Diagnoses   Final diagnoses:  Fall, initial encounter  Community acquired pneumonia of right middle lobe of lung ([place])  Acute cystitis without hematuria    ED Discharge Orders    None       Jacqlyn Larsen, [place] 02/11/18 [id number]    Duffy Bruce, MD 02/11/18 1708

## 2018-02-11 NOTE — H&P (Signed)
History and Physical    [name] HAL:[id number] DOB: Apr 24, 1929 DOA: 02/11/2018  Referring MD/NP/PA:   PCP: Lawerance Cruel, MD   Patient coming from:  The patient is coming from home.  At baseline, pt is dependent for most of ADL.        Chief Complaint: Fall,  HPI: [name] is a 84 y.o. female with medical history significant of hypertension, hyperlipidemia, GERD, anxiety, tremor, chronic thrombocytopenia, CHF, atrial fibrillation not on anticoagulants, CKD-3, mitral valve repair with bioprosthetic valve, pacemaker placement due to sick sinus rhythm, tremor, who presents with fall.  Per his son, patient pt fell backwards and hit her head on the shower door at about 1:00 AM. No LOC.  Denies any unilateral weakness, numbness or tingling his extremities.  No facial droop or slurred speech.  Patient states that she had a mild chest discomfort on the left side, which has resolved.  She has some mild dry cough, but no shortness breath, fever or chills.  She has nausea, no vomiting, diarrhea or abdominal pain.  Patient states that she has mild burning on urination, but no dysuria or increased urinary frequency.  ED Course: pt was found to have positive urinalysis (hazy appearance, small amount of leukocyte, rare bacteria, WBC 11-20), WBC 6.6, negative troponin, slightly worsening renal function, temperature normal, heart rate seems to be 80s, oxygen saturation 97% on room air.  Chest x-ray showed retrocardiac opacity.  CT head and CT of neck negative.  X-ray of right shoulder negative.  Patient is placed on telemetry bed of observation.  Review of Systems:   General: no fevers, chills, no body weight gain, has fatigue HEENT: no blurry vision, hearing changes or sore throat Respiratory: no dyspnea, has coughing, no wheezing CV: Had chest discomfort, no palpitations GI: Has nausea, no vomiting, abdominal pain, diarrhea, constipation GU: no dysuria, has burning on urination, no  increased urinary frequency, hematuria  Ext: Has leg edema Neuro: no unilateral weakness, numbness, or tingling, no vision change or hearing loss.  Patient has hand tremor and fall Skin: no rash, no skin tear. MSK: No muscle spasm, no deformity, no limitation of range of movement in spin Heme: No easy bruising.  Travel history: No recent long distant travel.  Allergy: No Known Allergies  Past Medical History:  Diagnosis Date  . Anxiety   . Arthritis    "mostly my hands" (01/29/2015)  . Basal cell carcinoma of forehead   . Benign essential tremor   . CAP (community acquired pneumonia) 01/29/2015  . CHF (congestive heart failure) ([place])   . Depression   . Edema of both legs 04-23-2013  pt states feet swelling (wears ted hose)   bilateral venous ablation procedures done in IR, L>R  . GERD (gastroesophageal reflux disease)   . H/O hiatal hernia   . Hyperlipidemia   . Hyperparathyroidism ([place])    MILD--  STABLE  . Hypertension   . Kidney stones   . Osteopenia   . Permanent atrial fibrillation    CARDIOLOGIST-- DR [id number]  . Pneumonia "several times"  . Presence of permanent cardiac pacemaker    LAST PACER CHECK  04-23-2013  IN [facility]  . Right ureteral stone   . S/P mitral valve replacement with bioprosthetic valve    04-03-2009  . Schatzki's ring   . Sick sinus syndrome ([place])    S/P  PACEMAKER  2009  . Thrombocytopenia, immune ([place])    CHRONIC---  HEMOTOLOGIST---  DR ENNEVER  .  Venous insufficiency, peripheral   . Wears glasses     Past Surgical History:  Procedure Laterality Date  . CARDIAC CATHETERIZATION  03/27/2009   normal coronaries; grade IV Mitral Insuff.  Marland Kitchen CARDIAC CATHETERIZATION  2001  &  2006  . CARDIAC VALVE REPLACEMENT    . CATARACT EXTRACTION W/ INTRAOCULAR LENS  IMPLANT, BILATERAL Bilateral ~ 2012  . CYSTOSCOPY W/ URETERAL STENT PLACEMENT Left 08/01/2012   Procedure: CYSTOSCOPY WITH RETROGRADE PYELOGRAM/URETERAL STENT PLACEMENT;  Surgeon: Dutch Gray, MD;   Location: [facility];  Service: Urology;  Laterality: Left;  . CYSTOSCOPY WITH URETEROSCOPY Left 08/14/2012   Procedure: LEFT URETEROSCOPY WITH HOLMIUM LASER AND LEFT  STENT PLACEMENT;  Surgeon: Malka So, MD;  Location: [facility];  Service: Urology;  Laterality: Left;  . CYSTOSCOPY WITH URETEROSCOPY AND STENT PLACEMENT Right 04/25/2013   Procedure: RIGHT URETEROSCOPY WITH  STENT PLACEMENT;  Surgeon: Irine Seal, MD;  Location[facility];  Service: Urology;  Laterality: Right;  . ESOPHAGOGASTRODUODENOSCOPY (EGD) WITH ESOPHAGEAL DILATION  "a couple times" (01/29/2015)  . EXTRACORPOREAL SHOCK WAVE LITHOTRIPSY Left 07-26-2012  . HAND SURGERY Right 05/2011   "no fracture; straightened things up  . HAND SURGERY Left 2014   "no fracture; straightened things up"  . HOLMIUM LASER APPLICATION Left 06/19/3873   Procedure:  HOLMIUM LASER APPLICATION;  Surgeon: Malka So, MD;  Location: [facility];  Service: Urology;  Laterality: Left;  . HOLMIUM LASER APPLICATION Right 6/43/3295   Procedure: HOLMIUM LASER APPLICATION;  Surgeon: Irine Seal, MD;  Location[facility];  Service: Urology;  Laterality: Right;  . MITRAL VALVE REPLACEMENT WITH CHORDAL PRESEVATION USING BIOPROSTHETIC VALVE/  LIGATION LEFT ATRIAL APPENDAGE/  LEFT-SIDED MAZE PROCEDURE  04-03-2009  DR Ethel Rana,  SERIAL # [id number]  . NM MYOCAR PERF WALL MOTION  03/01/1999   mild lateral ischemia  . PERMANENT PACEMAKER INSERTION  02/26/2007   Dual Chamber [employer] Programmed VVIR--- Generator ADDR01/  #[id number]   . TRANSTHORACIC ECHOCARDIOGRAM  09-07-2012  DR CROITORU   MILD LVH/  EF 55-60%/  MILD TO MODERATE AR/   SEVERE LAE  &  RAE/  MILD TO MODERATE TR/   MITRAL VALVE BIOPROSTHESIS LEAFLET NORMAL,  MODERATE STENOSIS WITH NO SIG. REGURG/  MODERATE RVE  . VAGINAL HYSTERECTOMY  1982    Social History:  reports that she has never smoked. She has never used smokeless tobacco. She reports that she does not drink  alcohol or use drugs.  Family History:  Family History  Problem Relation Age of Onset  . Cancer Mother   . Heart attack Father      Prior to Admission medications   Medication Sig Start Date End Date Taking? Authorizing Provider  ALPRAZolam (XANAX) 0.5 MG tablet TAKE 1 TABLET BY MOUTH AT BEDTIME Patient taking differently: Take 0.5 mg by mouth at bedtime.  03/27/15  Yes Carter, Monica, DO  amLODipine-olmesartan (AZOR) 5-20 MG tablet TAKE 1 TABLET BY MOUTH EVERY DAY Patient taking differently: Take 1 tablet by mouth every evening.  04/12/17  Yes Croitoru, Mihai, MD  aspirin EC 81 MG tablet Take 81 mg by mouth daily.    Yes [provider]  B Complex Vitamins (VITAMIN B COMPLEX PO) Take 1 tablet by mouth daily.    Yes [provider]  cholecalciferol (VITAMIN D) 1000 units tablet Take 2,000 Units by mouth daily.   Yes [provider]  furosemide (LASIX) 40 MG tablet Take 40 mg by  mouth daily. Reported on 01/28/2015   Yes [provider]  KLOR-CON M20 20 MEQ tablet Take 20 mEq by mouth daily. 04/15/15  Yes [provider]  Multiple Vitamin (MULTIVITAMIN WITH MINERALS) TABS Take 1 tablet by mouth daily.    Yes [provider]  omeprazole (PRILOSEC) 20 MG capsule Take 20 mg by mouth daily.    Yes [provider]  OVER THE COUNTER MEDICATION Take 1 tablet by mouth 2 (two) times daily. Nutrifuron (immune supplement)   Yes [provider]  Probiotic CAPS Take 1 capsule by mouth daily.   Yes [provider]  simvastatin (ZOCOR) 10 MG tablet TAKE 1 TABLET BY MOUTH EVERY DAY AT [facility] Patient taking differently: Take 10 mg by mouth daily at 6 PM.  03/30/15  Yes Medina-Vargas, Monina C, NP  docusate sodium (COLACE) 100 MG capsule Take 1 capsule (100 mg total) by mouth 2 (two) times daily. Patient not taking: Reported on 02/11/2018 02/04/15   Elmarie Shiley, MD    Physical Exam: Vitals:   02/11/18 0235 02/11/18 0245  02/11/18 0300 02/11/18 0445  BP: (!) 125/59 111/61 134/62 (!) 100/55  Pulse: 76 73 79 83  Resp: 17 (!) 22 (!) 21 17  Temp: 98.5 F (36.9 C)     TempSrc: Oral     SpO2: 98% 100% 99% 97%  Weight:      Height:       General: Not in acute distress.  HEENT:       Eyes: PERRL, EOMI, no scleral icterus.       ENT: No discharge from the ears and nose, no pharynx injection, no tonsillar enlargement.        Neck: No JVD, no bruit, no mass felt. Heme: No neck lymph node enlargement. Cardiac: S1/S2, RRR, No murmurs, No gallops or rubs. Respiratory: No rales, wheezing, rhonchi or rubs. GI: Soft, nondistended, nontender, no rebound pain, no organomegaly, BS present. GU: No hematuria Ext: 1+ pitting leg edema bilaterally. 2+DP/PT pulse bilaterally. Musculoskeletal: No joint deformities, No joint redness or warmth, no limitation of ROM in spin. Skin: No rashes.  Neuro: Alert, oriented X3, cranial nerves II-XII grossly intact, moves all extremities normally.  Patient has hand tremor Psych: Patient is not psychotic, no suicidal or hemocidal ideation.  Labs on Admission: I have personally reviewed following labs and imaging studies  CBC: Recent Labs  Lab 02/11/18 0340  WBC 6.6  HGB 12.1  HCT 37.7  MCV 99.2  PLT 90*   Basic Metabolic Panel: Recent Labs  Lab 02/11/18 0340  NA 139  K 5.1  CL 105  CO2 26  GLUCOSE 140*  BUN 30*  CREATININE 1.39*  CALCIUM 10.7*   GFR: Estimated Creatinine Clearance: 28.4 mL/min (A) (by C-G formula based on SCr of 1.39 mg/dL (H)). Liver Function Tests: No results for input(s): AST, ALT, ALKPHOS, BILITOT, PROT, ALBUMIN in the last 168 hours. No results for input(s): LIPASE, AMYLASE in the last 168 hours. No results for input(s): AMMONIA in the last 168 hours. Coagulation Profile: No results for input(s): INR, PROTIME in the last 168 hours. Cardiac Enzymes: No results for input(s): CKTOTAL, CKMB, CKMBINDEX, TROPONINI in the last 168 hours. BNP (last  3 results) No results for input(s): PROBNP in the last 8760 hours. HbA1C: No results for input(s): HGBA1C in the last 72 hours. CBG: No results for input(s): GLUCAP in the last 168 hours. Lipid Profile: No results for input(s): CHOL, HDL, LDLCALC, TRIG, CHOLHDL, LDLDIRECT in  the last 72 hours. Thyroid Function Tests: No results for input(s): TSH, T4TOTAL, FREET4, T3FREE, THYROIDAB in the last 72 hours. Anemia Panel: No results for input(s): VITAMINB12, FOLATE, FERRITIN, TIBC, IRON, RETICCTPCT in the last 72 hours. Urine analysis:    Component Value Date/Time   COLORURINE YELLOW 02/11/2018 0327   APPEARANCEUR HAZY (A) 02/11/2018 0327   LABSPEC 1.009 02/11/2018 0327   PHURINE 5.0 02/11/2018 0327   GLUCOSEU NEGATIVE 02/11/2018 0327   HGBUR NEGATIVE 02/11/2018 0327   BILIRUBINUR NEGATIVE 02/11/2018 0327   KETONESUR NEGATIVE 02/11/2018 0327   PROTEINUR NEGATIVE 02/11/2018 0327   UROBILINOGEN 0.2 04/17/2009 1759   NITRITE NEGATIVE 02/11/2018 0327   LEUKOCYTESUR SMALL (A) 02/11/2018 0327   Sepsis Labs: @LABRCNTIP (procalcitonin:4,lacticidven:4) )No results found for this or any previous visit (from the past 240 hour(s)).   Radiological Exams on Admission: Dg Chest 2 View  Result Date: 02/11/2018 CLINICAL DATA:  Status post fall, with concern for chest injury. Initial encounter. EXAM: CHEST - 2 VIEW COMPARISON:  Chest radiograph performed 04/15/2015 FINDINGS: The lungs are well-aerated; retrocardiac airspace opacity may reflect atelectasis or pneumonia. There is no evidence of pleural effusion or pneumothorax. The heart is borderline normal in size. The patient is status post median sternotomy. A pacemaker is noted at the left chest wall, with leads ending at the right atrium and right ventricle. No acute osseous abnormalities are seen. IMPRESSION: Retrocardiac airspace opacity may reflect atelectasis or pneumonia. No displaced rib fracture seen. Electronically Signed   By: Garald Balding  M.D.   On: 02/11/2018 04:14   Dg Shoulder Right  Result Date: 02/11/2018 CLINICAL DATA:  Status post fall, with chronic right shoulder pain. Initial encounter. EXAM: RIGHT SHOULDER - 2+ VIEW COMPARISON:  Right shoulder radiographs performed 10/18/2017 FINDINGS: There is no evidence of fracture or dislocation. The right humeral head is seated within the glenoid fossa. Degenerative change is noted at the right acromioclavicular joint, with mild inferior spurring. No significant soft tissue abnormalities are seen. The visualized portions of the right lung are grossly clear. IMPRESSION: No evidence of fracture or dislocation. Degenerative change again noted at the right acromioclavicular joint. Electronically Signed   By: Garald Balding M.D.   On: 02/11/2018 04:12   Ct Head Wo Contrast  Result Date: 02/11/2018 CLINICAL DATA:  Fall with head trauma. EXAM: CT HEAD WITHOUT CONTRAST CT CERVICAL SPINE WITHOUT CONTRAST TECHNIQUE: Multidetector CT imaging of the head and cervical spine was performed following the standard protocol without intravenous contrast. Multiplanar CT image reconstructions of the cervical spine were also generated. COMPARISON:  None. FINDINGS: CT HEAD FINDINGS Brain: There is no mass, hemorrhage or extra-axial collection. There is generalized atrophy without lobar predilection. There is marked cerebellar atrophy. There is hypoattenuation of the periventricular white matter, most commonly indicating chronic ischemic microangiopathy. Vascular: No abnormal hyperdensity of the major intracranial arteries or dural venous sinuses. No intracranial atherosclerosis. Skull: Superior right scalp hematoma.  No skull fracture. Sinuses/Orbits: No fluid levels or advanced mucosal thickening of the visualized paranasal sinuses. No mastoid or middle ear effusion. The orbits are normal. CT CERVICAL SPINE FINDINGS Alignment: No static subluxation. Facets are aligned. Occipital condyles are normally positioned.  Skull base and vertebrae: No acute fracture. Soft tissues and spinal canal: No prevertebral fluid or swelling. No visible canal hematoma. Disc levels: No advanced spinal canal or neural foraminal stenosis. Upper chest: No pneumothorax, pulmonary nodule or pleural effusion. Other: Normal visualized paraspinal cervical soft tissues. IMPRESSION: 1. No acute intracranial abnormality. 2. Superior  right scalp hematoma without skull fracture. 3. No acute fracture or static subluxation of the cervical spine. Electronically Signed   By: Ulyses Jarred M.D.   On: 02/11/2018 04:12   Ct Cervical Spine Wo Contrast  Result Date: 02/11/2018 CLINICAL DATA:  Fall with head trauma. EXAM: CT HEAD WITHOUT CONTRAST CT CERVICAL SPINE WITHOUT CONTRAST TECHNIQUE: Multidetector CT imaging of the head and cervical spine was performed following the standard protocol without intravenous contrast. Multiplanar CT image reconstructions of the cervical spine were also generated. COMPARISON:  None. FINDINGS: CT HEAD FINDINGS Brain: There is no mass, hemorrhage or extra-axial collection. There is generalized atrophy without lobar predilection. There is marked cerebellar atrophy. There is hypoattenuation of the periventricular white matter, most commonly indicating chronic ischemic microangiopathy. Vascular: No abnormal hyperdensity of the major intracranial arteries or dural venous sinuses. No intracranial atherosclerosis. Skull: Superior right scalp hematoma.  No skull fracture. Sinuses/Orbits: No fluid levels or advanced mucosal thickening of the visualized paranasal sinuses. No mastoid or middle ear effusion. The orbits are normal. CT CERVICAL SPINE FINDINGS Alignment: No static subluxation. Facets are aligned. Occipital condyles are normally positioned. Skull base and vertebrae: No acute fracture. Soft tissues and spinal canal: No prevertebral fluid or swelling. No visible canal hematoma. Disc levels: No advanced spinal canal or neural  foraminal stenosis. Upper chest: No pneumothorax, pulmonary nodule or pleural effusion. Other: Normal visualized paraspinal cervical soft tissues. IMPRESSION: 1. No acute intracranial abnormality. 2. Superior right scalp hematoma without skull fracture. 3. No acute fracture or static subluxation of the cervical spine. Electronically Signed   By: Ulyses Jarred M.D.   On: 02/11/2018 04:12     EKG: Independently reviewed.  Artificial effect, difficult to tell rhythm, poor R wave progression, anteroseptal infarction pattern, LAD.  Assessment/Plan Principal Problem:   Fall Active Problems:   Chronic diastolic congestive heart failure (HCC)   Thrombocytopenia (HCC)   Hypercalcemia   Permanent atrial fibrillation   Essential hypertension   Hyperlipidemia   Pacemaker   GERD (gastroesophageal reflux disease)   Anxiety   CKD (chronic kidney disease), stage III (HCC)   UTI (urinary tract infection)   Fall: pt seems to have had mechanical fall.  No loss of consciousness.  Imagings are negative including negative CT head, CT of C-spine and right shoulder x-ray.  This may be triggered by UTI. -Placed on telemetry bed for observation - PT/OT -Treat UTI  UTI (urinary tract infection): -Rocephin iv -Follow-up of blood culture and urine culture  Chronic diastolic congestive heart failure ([place]): 2D echo on 09/07/2012 showed EF of 55 to 60%.  She has 1+ leg edema and mild JVD, but patient denies shortness of breath.  She may have mild fluid retention, but no acute CHF exacerbation. -hold home Lasix due to worsening renal function -Check BNP  Thrombocytopenia ([place]): This is a chronic issue.  Platelet 90.  No bleeding tendency.  - Patient is followed up with hematologist, Dr. Marin Olp  Hypercalcemia: Mild, calcium 10.7.  Mental status normal.  This is a chronic issue.  Etiology is not clear. -Hold vitamin D supplement  Atrial Fibrillation: CHA2DS2-VASc Score is 5 , needs oral anticoagulation, but  pt is no on AC, possibly due to high risk of bleeding secondary to chronic thrombocytopenia.  Patient also has high risk for fall.  Heart rate seems to be in the 80s.  Telemetry is not accurate due to artificial effect secondary to tremor. -Observe closely  HTN:  -Continue home medications: Azor and lasix -  IV hydralazine prn  Hyperlipidemia: -zocor  GERD: -Protonix  Anxiety: -prn xanax  CKD (chronic kidney disease), stage III ([place]): Slightly worsening.  Baseline creatinine 1.0-1.2.  Her creatinine is 1.39, BUN 30, consistent with prerenal failure. -Hold Lasix -Patient received 500 cc normal saline in ED.  Abnormal chest x-ray and chest discomfort chest x-ray showed possible retrocardiac opacity, but the patient does not have shortness of breath, fever or chills.  No leukocytosis.  Clinically does not seem to have pneumonia.  But the patient states that she had mild chest discomfort earlier, which has resolved.  Initial troponin negative.  Patient received 1 dose of doxycycline and Rocephin.  Will DC doxycycline and continue Rocephin for UTI. -Troponin x3 -Continue aspirin and Zocor.  DVT ppx: SCD Code Status: Full code Family Communication:   Yes, patient's  son  at bed side Disposition Plan:  Anticipate discharge back to previous home environment Consults called:  none Admission status: Obs / tele    Date of Service 02/11/2018    Ivor Costa [facility] Pager [phone]  If 7PM-7AM, please contact night-coverage [email or web address] Password [facility] 02/11/2018, 6:46 AM

## 2018-02-11 NOTE — ED Notes (Signed)
Pacemaker interrogated. 

## 2018-02-11 NOTE — ED Triage Notes (Signed)
Pt arrived [facility] from home s/p mechanical fall. Pt fell backwards and hit her head on the shower door, small knot on the back of her head, c/o pain in her head, dizziness, and nausea. EMS reports pt reported chest pain after they lifted her up from the ground with her arms 20 L wrist BP 129/73 P 72 O2 99% RA CBG 151

## 2018-02-12 ENCOUNTER — Other Ambulatory Visit: Payer: Self-pay

## 2018-02-12 ENCOUNTER — Ambulatory Visit (HOSPITAL_COMMUNITY): Payer: Medicare Other | Attending: Internal Medicine

## 2018-02-12 DIAGNOSIS — F419 Anxiety disorder, unspecified: Secondary | ICD-10-CM | POA: Diagnosis not present

## 2018-02-12 DIAGNOSIS — W19XXXA Unspecified fall, initial encounter: Secondary | ICD-10-CM | POA: Diagnosis not present

## 2018-02-12 DIAGNOSIS — N3000 Acute cystitis without hematuria: Secondary | ICD-10-CM | POA: Diagnosis not present

## 2018-02-12 LAB — BASIC METABOLIC PANEL
Anion gap: 7 (ref 5–15)
BUN: 15 mg/dL (ref 8–23)
CO2: 24 mmol/L (ref 22–32)
Calcium: 9.7 mg/dL (ref 8.9–10.3)
Chloride: 110 mmol/L (ref 98–111)
Creatinine, Ser: 0.93 mg/dL (ref 0.44–1.00)
GFR calc non Af Amer: 56 mL/min — ABNORMAL LOW (ref >60)
Glucose, Bld: 116 mg/dL — ABNORMAL HIGH (ref 70–99)
Potassium: 5.3 mmol/L — ABNORMAL HIGH (ref 3.5–5.1)
Sodium: 141 mmol/L (ref 135–145)

## 2018-02-12 LAB — GLUCOSE, CAPILLARY
Glucose-Capillary: 98 mg/dL (ref 70–99)
Glucose-Capillary: 141 mg/dL — ABNORMAL HIGH (ref 70–99)

## 2018-02-12 LAB — CBC
HCT: 35.7 % — ABNORMAL LOW (ref 36.0–46.0)
Hemoglobin: 11.2 g/dL — ABNORMAL LOW (ref 12.0–15.0)
MCH: 30.4 pg (ref 26.0–34.0)
MCHC: 31.4 g/dL (ref 30.0–36.0)
MCV: 97 fL (ref 80.0–100.0)
NRBC: 0 % (ref 0.0–0.2)
Platelets: 79 10*3/uL — ABNORMAL LOW (ref 150–400)
RBC: 3.68 MIL/uL — ABNORMAL LOW (ref 3.87–5.11)
RDW: 14.6 % (ref 11.5–15.5)
WBC: 4.5 10*3/uL (ref 4.0–10.5)

## 2018-02-12 MED ORDER — AZITHROMYCIN 500 MG PO TABS
500.0000 mg | ORAL_TABLET | Freq: Every day | ORAL | Status: DC
  Administered 2018-02-12: 500 mg via ORAL
  Filled 2018-02-12: qty 1

## 2018-02-12 MED ORDER — AZITHROMYCIN 500 MG PO TABS: 500.0000 mg | ORAL_TABLET | Freq: Every day | ORAL | 0 refills | Status: AC

## 2018-02-12 MED ORDER — KLOR-CON M20 20 MEQ PO TBCR: 20.0000 meq | EXTENDED_RELEASE_TABLET | Freq: Every day | ORAL | Status: DC | PRN

## 2018-02-12 MED ORDER — CEFDINIR 300 MG PO CAPS
300.0000 mg | ORAL_CAPSULE | Freq: Two times a day (BID) | ORAL | Status: DC
  Filled 2018-02-12 (×2): qty 1

## 2018-02-12 MED ORDER — FUROSEMIDE 40 MG PO TABS: 40.0000 mg | ORAL_TABLET | Freq: Every day | ORAL | Status: DC | PRN

## 2018-02-12 MED ORDER — CEFDINIR 300 MG PO CAPS: 300.0000 mg | ORAL_CAPSULE | Freq: Two times a day (BID) | ORAL | 0 refills | Status: AC

## 2018-02-12 NOTE — Care Management Note (Signed)
Case Management Note  Patient Details  Name: Anajulia Leyendecker MRN: [id number] Date of Birth: 1929/03/07  Subjective/Objective: Fall,  UTI                 Action/Plan: CM talked to patient with her granddaughter at the bedside. Pt lives at home, her daughter is the primary caregiver; PCP: Lawerance Cruel, MD; has private insurance with [facility] with prescription drug coverage; [place] choice offered, pt chose [place]; Dan with [facility] called for arrangements. DME- walker and cane at home.  Expected Discharge Date:  02/12/18               Expected Discharge Plan:  [place]  Discharge planning Services  CM Consult  Choice offered to:  Patient, Adult Children  HH Arranged:  RN, PT, aide [facility] Agency:  [place]  Status of Service:  In process, will continue to follow  Sherrilyn Rist [phone] 02/12/2018, 1:06 PM

## 2018-02-12 NOTE — Progress Notes (Signed)
Patient and grand daughter provided discharge instructions to include medication changes and follow up appointments. Patient and family verbalize understanding of discharge education.

## 2018-02-12 NOTE — Progress Notes (Signed)
Orthostatic vitals complete

## 2018-02-12 NOTE — Plan of Care (Signed)
  Problem: Clinical Measurements: Goal: Cardiovascular complication will be avoided Outcome: Progressing   Problem: Clinical Measurements: Goal: Respiratory complications will improve Outcome: Progressing   Problem: Clinical Measurements: Goal: Ability to maintain clinical measurements within normal limits will improve Outcome: Progressing   

## 2018-02-12 NOTE — Evaluation (Signed)
Clinical/Bedside Swallow Evaluation Patient Details  Name: [name] MRN: [id number] Date of Birth: 06-Aug-1929  Today's Date: 02/12/2018 Time: SLP Start Time (ACUTE ONLY): 2725 SLP Stop Time (ACUTE ONLY): 0852 SLP Time Calculation (min) (ACUTE ONLY): 13 min  Past Medical History:  Past Medical History:  Diagnosis Date  . Anxiety   . Arthritis    "mostly my hands" (01/29/2015)  . Basal cell carcinoma of forehead   . Benign essential tremor   . CAP (community acquired pneumonia) 01/29/2015  . CHF (congestive heart failure) ([place])   . Depression   . Edema of both legs 04-23-2013  pt states feet swelling (wears ted hose)   bilateral venous ablation procedures done in IR, L>R  . GERD (gastroesophageal reflux disease)   . H/O hiatal hernia   . Hyperlipidemia   . Hyperparathyroidism ([place])    MILD--  STABLE  . Hypertension   . Kidney stones   . Osteopenia   . Permanent atrial fibrillation    CARDIOLOGIST-- DR DGUYQIHK  . Pneumonia "several times"  . Presence of permanent cardiac pacemaker    LAST PACER CHECK  04-23-2013  IN [facility]  . Right ureteral stone   . S/P mitral valve replacement with bioprosthetic valve    04-03-2009  . Schatzki's ring   . Sick sinus syndrome ([place])    S/P  PACEMAKER  2009  . Thrombocytopenia, immune ([place])    CHRONIC---  HEMOTOLOGIST---  DR ENNEVER  . Venous insufficiency, peripheral   . Wears glasses    Past Surgical History:  Past Surgical History:  Procedure Laterality Date  . CARDIAC CATHETERIZATION  03/27/2009   normal coronaries; grade IV Mitral Insuff.  Marland Kitchen CARDIAC CATHETERIZATION  2001  &  2006  . CARDIAC VALVE REPLACEMENT    . CATARACT EXTRACTION W/ INTRAOCULAR LENS  IMPLANT, BILATERAL Bilateral ~ 2012  . CYSTOSCOPY W/ URETERAL STENT PLACEMENT Left 08/01/2012   Procedure: CYSTOSCOPY WITH RETROGRADE PYELOGRAM/URETERAL STENT PLACEMENT;  Surgeon: Dutch Gray, MD;  Location: [facility];  Service: Urology;  Laterality: Left;  . CYSTOSCOPY WITH  URETEROSCOPY Left 08/14/2012   Procedure: LEFT URETEROSCOPY WITH HOLMIUM LASER AND LEFT  STENT PLACEMENT;  Surgeon: Malka So, MD;  Location: [facility];  Service: Urology;  Laterality: Left;  . CYSTOSCOPY WITH URETEROSCOPY AND STENT PLACEMENT Right 04/25/2013   Procedure: RIGHT URETEROSCOPY WITH  STENT PLACEMENT;  Surgeon: Irine Seal, MD;  Location: [facility];  Service: Urology;  Laterality: Right;  . ESOPHAGOGASTRODUODENOSCOPY (EGD) WITH ESOPHAGEAL DILATION  "a couple times" (01/29/2015)  . EXTRACORPOREAL SHOCK WAVE LITHOTRIPSY Left 07-26-2012  . HAND SURGERY Right 05/2011   "no fracture; straightened things up  . HAND SURGERY Left 2014   "no fracture; straightened things up"  . HOLMIUM LASER APPLICATION Left 08/17/2593   Procedure:  HOLMIUM LASER APPLICATION;  Surgeon: Malka So, MD;  Location: [facility];  Service: Urology;  Laterality: Left;  . HOLMIUM LASER APPLICATION Right 6/38/7564   Procedure: HOLMIUM LASER APPLICATION;  Surgeon: Irine Seal, MD;  Location: [facility];  Service: Urology;  Laterality: Right;  . MITRAL VALVE REPLACEMENT WITH CHORDAL PRESEVATION USING BIOPROSTHETIC VALVE/  LIGATION LEFT ATRIAL APPENDAGE/  LEFT-SIDED MAZE PROCEDURE  04-03-2009  DR Ethel Rana,  SERIAL # [id number]  . NM MYOCAR PERF WALL MOTION  03/01/1999   mild lateral ischemia  . PERMANENT PACEMAKER INSERTION  02/26/2007   Dual Chamber [employer] Programmed VVIR--- Generator [id number]/  #[id number] H   .  TRANSTHORACIC ECHOCARDIOGRAM  09-07-2012  DR CROITORU   MILD LVH/  EF 55-60%/  MILD TO MODERATE AR/   SEVERE LAE  &  RAE/  MILD TO MODERATE TR/   MITRAL VALVE BIOPROSTHESIS LEAFLET NORMAL,  MODERATE STENOSIS WITH NO SIG. REGURG/  MODERATE RVE  . VAGINAL HYSTERECTOMY  1982   HPI:  [name] is a 84 y.o. female with medical history significant of hypertension, hyperlipidemia, GERD, anxiety, tremor, chronic thrombocytopenia, CHF, atrial fibrillation not on anticoagulants,  CKD-3, mitral valve repair with bioprosthetic valve, pacemaker placement due to sick sinus rhythm, tremor, who presents with fall.  Chest x-ray showed retrocardiac opacity Retrocardiac airspace opacity may reflect atelectasis or pneumonia. CT head and CT of neck negative. MBS 02/04/15 deep penetration with thin to cords before swallow from premature spill and after from pyriform sinus residue. Dys 3, nectar recommended cords before swallow from premature spill and after from pyriform sinus residue. Dys 3, nectar recommended cords before swallow from premature spill and after from pyriform sinus residue. Dys 3, nectar recommended     Assessment / Plan / Recommendation Clinical Impression  Vocal quality is tremorous as are upper extremities. Respiratory effort and strength are adequate. Pt coughed midway through 3 pz water assessment likely due to increased volume and rate. Smaller single cup and straw sips appeared to be consumed safely. No difficulty masticating solid. Suspect esophageal component as pt eructated and reported "pills came back up yesteday." Recommend regular texture, thin liquids, straws allowed, pills whole in puree, sit upright after meals, small amounts and alternate liquids and solids. She is at higher aspiration risk if precautions are not followed.    SLP Visit Diagnosis: Dysphagia, unspecified (R13.10)    Aspiration Risk  Mild aspiration risk    Diet Recommendation Regular;Thin liquid   Liquid Administration via: Cup;Straw Medication Administration: Whole meds with puree Supervision: Patient able to self feed Compensations: Slow rate;Small sips/bites Postural Changes: Seated upright at 90 degrees;Remain upright for at least 30 minutes after po intake    Other  Recommendations Oral Care Recommendations: Oral care BID   Follow up Recommendations None      Frequency and Duration            Prognosis        Swallow Study   General HPI: [name] is a 84  y.o. female with medical history significant of hypertension, hyperlipidemia, GERD, anxiety, tremor, chronic thrombocytopenia, CHF, atrial fibrillation not on anticoagulants, CKD-3, mitral valve repair with bioprosthetic valve, pacemaker placement due to sick sinus rhythm, tremor, who presents with fall.  Chest x-ray showed retrocardiac opacity Retrocardiac airspace opacity may reflect atelectasis or pneumonia. CT head and CT of neck negative. MBS 02/04/15 deep penetration with thin to cords before swallow from premature spill and after from pyriform sinus residue. Dys 3, nectar recommended cords before swallow from premature spill and after from pyriform sinus residue. Dys 3, nectar recommended cords before swallow from premature spill and after from pyriform sinus residue. Dys 3, nectar recommended   Type of Study: Bedside Swallow Evaluation Previous Swallow Assessment: see HPI Diet Prior to this Study: NPO Temperature Spikes Noted: No Respiratory Status: Room air History of Recent Intubation: No Behavior/Cognition: Alert;Cooperative;Pleasant mood Oral Cavity Assessment: Within Functional Limits Oral Care Completed by SLP: No Oral Cavity - Dentition: Other (Comment)(adequate partials) Vision: Functional for self-feeding Self-Feeding Abilities: Able to feed self Patient Positioning: Upright in bed Baseline Vocal Quality: Other (comment)(tremorous) Volitional Cough: Strong Volitional Swallow:  Able to elicit    Oral/Motor/Sensory Function Overall Oral Motor/Sensory Function: Within functional limits   Ice Chips Ice chips: Not tested   Thin Liquid Thin Liquid: Impaired Presentation: Cup Pharyngeal  Phase Impairments: Cough - Immediate    Nectar Thick Nectar Thick Liquid: Not tested   Honey Thick Honey Thick Liquid: Not tested   Puree Puree: Within functional limits   Solid     Solid: Within functional limits      Houston Siren 02/12/2018,9:53 AM   Orbie Pyo Colvin Caroli.Ed  Risk analyst [phone] Office [phone]

## 2018-02-13 LAB — URINE CULTURE: Culture: 80000 — AB

## 2018-02-14 NOTE — Discharge Summary (Signed)
[facility] Discharge Summary   Patient: [name] YPP:[id number]   PCP: Lawerance Cruel, MD DOB: 1929-10-26   Date of admission: 02/11/2018   Date of discharge: 02/12/2018     Discharge Diagnoses:  Principal diagnosis Community-acquired pneumonia as well as UTI.  Principal Problem:   Fall Active Problems:   Chronic diastolic congestive heart failure (HCC)   Thrombocytopenia (HCC)   Hypercalcemia   Permanent atrial fibrillation   Essential hypertension   Hyperlipidemia   Pacemaker   GERD (gastroesophageal reflux disease)   Anxiety   CKD (chronic kidney disease), stage III (HCC)   UTI (urinary tract infection)   Admitted From: [facility] Disposition:  Home with home health  Recommendations for Outpatient Follow-up:  1. Please follow-up with PCP in 1 week.  Follow-up Information    Lawerance Cruel, MD. Schedule an appointment as soon as possible for a visit in 1 week(s).   Specialty:  Family Medicine Contact information: [place] [address] [phone]          Diet recommendation: regular diet  Activity: The patient is advised to gradually reintroduce usual activities.  Discharge Condition: good  Code Status: full code  History of present illness: As per the H and P dictated on admission, "[name] is a 85 y.o. female with medical history significant of hypertension, hyperlipidemia, GERD, anxiety, tremor, chronic thrombocytopenia, CHF, atrial fibrillation not on anticoagulants, CKD-3, mitral valve repair with bioprosthetic valve, pacemaker placement due to sick sinus rhythm, tremor, who presents with fall.  Per his son, patient pt fell backwards and hit her head on the shower door at about 1:00 AM. No LOC.  Denies any unilateral weakness, numbness or tingling his extremities.  No facial droop or slurred speech.  Patient states that she had a mild chest discomfort on the left side, which has resolved.  She has some  mild dry cough, but no shortness breath, fever or chills.  She has nausea, no vomiting, diarrhea or abdominal pain.  Patient states that she has mild burning on urination, but no dysuria or increased urinary frequency.  ED Course: pt was found to have positive urinalysis (hazy appearance, small amount of leukocyte, rare bacteria, WBC 11-20), WBC 6.6, negative troponin, slightly worsening renal function, temperature normal, heart rate seems to be 80s, oxygen saturation 97% on room air.  Chest x-ray showed retrocardiac opacity.  CT head and CT of neck negative.  X-ray of right shoulder negative.  Patient is placed on telemetry bed of observation."  Hospital Course:  Summary of her active problems in the hospital is as following. Fall:  pt seems to have had mechanical fall.   No loss of consciousness.   Imagings are negative including negative CT head, CT of C-spine and right shoulder x-ray.   This may be triggered by infection No significant events on telemetry. PT OT recommends home with home health.  Community-acquired pneumonia. chest discomfort  chest x-ray showed possible retrocardiac opacity,  While the patient does not have shortness of breath, fever or chills, No leukocytosis; she does have left basal crackles on examination. We will continue antibiotics which will cover both pneumonia as well as UTI. troponin negative.  Continue aspirin and Zocor.  UTI (urinary tract infection) E. coli -Rocephin iv, switch to cefdinir. Urine growing E. coli.  Sensitive to all antibiotics other than Cipro.  Chronic diastolic congestive heart failure ([place]):  2D echo on 09/07/2012 showed EF of 55 to 60%.  She has 1+ leg edema and mild JVD, but patient denies shortness of breath.   Resume home medication.  Thrombocytopenia ([place]): This is a chronic issue.  Platelet 90.  No bleeding tendency.  - Patient is followed up with hematologist, Dr. Marin Olp  Hypercalcemia: Mild, Mental status normal.     This is a chronic issue.  Etiology is not clear.  Persistent atrial Fibrillation:  CHA2DS2-VASc Score is 5 , needs oral anticoagulation,  but pt is no on AC, possibly due to high risk of bleeding secondary to chronic thrombocytopenia.   Patient also has high risk for fall.   Heart rate seems to be rate controlled.  HTN:  -Continue home medications: Azor and lasix  Hyperlipidemia: -zocor  GERD: -Protonix  Anxiety: -prn xanax  CKD (chronic kidney disease), stage III ([place]):  Slightly worsening.  Baseline creatinine 1.0-1.2.  Her creatinine is 1.39, BUN 30, consistent with prerenal failure. Improved with fluids.  All other chronic medical condition were stable during the hospitalization.  Patient was seen by physical therapy, who recommended home health, which was arranged by Education officer, museum and case Freight forwarder. On the day of the discharge the patient's vitals were stable , and no other acute medical condition were reported by patient. the patient was felt safe to be discharge at home with home health.  Consultants: none Procedures: noen  DISCHARGE MEDICATION: Allergies as of 02/12/2018   No Known Allergies     Medication List    TAKE these medications   ALPRAZolam 0.5 MG tablet Commonly known as:  XANAX TAKE 1 TABLET BY MOUTH AT BEDTIME   amLODipine-olmesartan 5-20 MG tablet Commonly known as:  AZOR TAKE 1 TABLET BY MOUTH EVERY DAY What changed:  when to take this   aspirin EC 81 MG tablet Take 81 mg by mouth daily.   azithromycin 500 MG tablet Commonly known as:  ZITHROMAX Take 1 tablet (500 mg total) by mouth daily for 4 days.   cefdinir 300 MG capsule Commonly known as:  OMNICEF Take 1 capsule (300 mg total) by mouth 2 (two) times daily for 5 days.   cholecalciferol 1000 units tablet Commonly known as:  VITAMIN D Take 2,000 Units by mouth daily.   docusate sodium 100 MG capsule Commonly known as:  COLACE Take 1 capsule (100 mg total) by mouth 2  (two) times daily.   furosemide 40 MG tablet Commonly known as:  LASIX Take 1 tablet (40 mg total) by mouth daily as needed for fluid or edema. Reported on 01/28/2015 What changed:    when to take this  reasons to take this   KLOR-CON M20 20 MEQ tablet Generic drug:  potassium chloride SA Take 1 tablet (20 mEq total) by mouth daily as needed (take with furosemide or lasix). What changed:    when to take this  reasons to take this   multivitamin with minerals Tabs tablet Take 1 tablet by mouth daily.   omeprazole 20 MG capsule Commonly known as:  PRILOSEC Take 20 mg by mouth daily.   OVER THE COUNTER MEDICATION Take 1 tablet by mouth 2 (two) times daily. Nutrifuron (immune supplement)   Probiotic Caps Take 1 capsule by mouth daily.   simvastatin 10 MG tablet Commonly known as:  ZOCOR TAKE 1 TABLET BY MOUTH EVERY DAY AT DINNER What changed:  See the new instructions.   VITAMIN B COMPLEX PO Take 1 tablet by mouth daily.      No Known Allergies Discharge Instructions  Diet - low sodium heart healthy   Complete by:  As directed    Increase activity slowly   Complete by:  As directed      Discharge Exam: Filed Weights   02/11/18 0231 02/11/18 1500 02/12/18 0500  Weight: 72.6 kg 76.9 kg 75.1 kg   Vitals:   02/12/18 1110 02/12/18 1150  BP:  116/62  Pulse: 91 (!) 58  Resp:  18  Temp:  98.6 F (37 C)  SpO2:  98%   General: Appear in no distress, no Rash; Oral Mucosa moist. Cardiovascular: S1 and S2 Present, no Murmur, no JVD Respiratory: Bilateral Air entry present and left basal Crackles, no wheezes Abdomen: Bowel Sound present, Soft and no tenderness Extremities: no Pedal edema, no calf tenderness Neurology: Grossly no focal neuro deficit.  The results of significant diagnostics from this hospitalization (including imaging, microbiology, ancillary and laboratory) are listed below for reference.    Significant Diagnostic Studies: Dg Chest 2  View  Result Date: 02/11/2018 CLINICAL DATA:  Status post fall, with concern for chest injury. Initial encounter. EXAM: CHEST - 2 VIEW COMPARISON:  Chest radiograph performed 04/15/2015 FINDINGS: The lungs are well-aerated; retrocardiac airspace opacity may reflect atelectasis or pneumonia. There is no evidence of pleural effusion or pneumothorax. The heart is borderline normal in size. The patient is status post median sternotomy. A pacemaker is noted at the left chest wall, with leads ending at the right atrium and right ventricle. No acute osseous abnormalities are seen. IMPRESSION: Retrocardiac airspace opacity may reflect atelectasis or pneumonia. No displaced rib fracture seen. Electronically Signed   By: Garald Balding M.D.   On: 02/11/2018 04:14   Dg Shoulder Right  Result Date: 02/11/2018 CLINICAL DATA:  Status post fall, with chronic right shoulder pain. Initial encounter. EXAM: RIGHT SHOULDER - 2+ VIEW COMPARISON:  Right shoulder radiographs performed 10/18/2017 FINDINGS: There is no evidence of fracture or dislocation. The right humeral head is seated within the glenoid fossa. Degenerative change is noted at the right acromioclavicular joint, with mild inferior spurring. No significant soft tissue abnormalities are seen. The visualized portions of the right lung are grossly clear. IMPRESSION: No evidence of fracture or dislocation. Degenerative change again noted at the right acromioclavicular joint. Electronically Signed   By: Garald Balding M.D.   On: 02/11/2018 04:12   Ct Head Wo Contrast  Result Date: 02/11/2018 CLINICAL DATA:  Fall with head trauma. EXAM: CT HEAD WITHOUT CONTRAST CT CERVICAL SPINE WITHOUT CONTRAST TECHNIQUE: Multidetector CT imaging of the head and cervical spine was performed following the standard protocol without intravenous contrast. Multiplanar CT image reconstructions of the cervical spine were also generated. COMPARISON:  None. FINDINGS: CT HEAD FINDINGS Brain:  There is no mass, hemorrhage or extra-axial collection. There is generalized atrophy without lobar predilection. There is marked cerebellar atrophy. There is hypoattenuation of the periventricular white matter, most commonly indicating chronic ischemic microangiopathy. Vascular: No abnormal hyperdensity of the major intracranial arteries or dural venous sinuses. No intracranial atherosclerosis. Skull: Superior right scalp hematoma.  No skull fracture. Sinuses/Orbits: No fluid levels or advanced mucosal thickening of the visualized paranasal sinuses. No mastoid or middle ear effusion. The orbits are normal. CT CERVICAL SPINE FINDINGS Alignment: No static subluxation. Facets are aligned. Occipital condyles are normally positioned. Skull base and vertebrae: No acute fracture. Soft tissues and spinal canal: No prevertebral fluid or swelling. No visible canal hematoma. Disc levels: No advanced spinal canal or neural foraminal stenosis. Upper chest: No pneumothorax, pulmonary nodule  or pleural effusion. Other: Normal visualized paraspinal cervical soft tissues. IMPRESSION: 1. No acute intracranial abnormality. 2. Superior right scalp hematoma without skull fracture. 3. No acute fracture or static subluxation of the cervical spine. Electronically Signed   By: Ulyses Jarred M.D.   On: 02/11/2018 04:12   Ct Cervical Spine Wo Contrast  Result Date: 02/11/2018 CLINICAL DATA:  Fall with head trauma. EXAM: CT HEAD WITHOUT CONTRAST CT CERVICAL SPINE WITHOUT CONTRAST TECHNIQUE: Multidetector CT imaging of the head and cervical spine was performed following the standard protocol without intravenous contrast. Multiplanar CT image reconstructions of the cervical spine were also generated. COMPARISON:  None. FINDINGS: CT HEAD FINDINGS Brain: There is no mass, hemorrhage or extra-axial collection. There is generalized atrophy without lobar predilection. There is marked cerebellar atrophy. There is hypoattenuation of the  periventricular white matter, most commonly indicating chronic ischemic microangiopathy. Vascular: No abnormal hyperdensity of the major intracranial arteries or dural venous sinuses. No intracranial atherosclerosis. Skull: Superior right scalp hematoma.  No skull fracture. Sinuses/Orbits: No fluid levels or advanced mucosal thickening of the visualized paranasal sinuses. No mastoid or middle ear effusion. The orbits are normal. CT CERVICAL SPINE FINDINGS Alignment: No static subluxation. Facets are aligned. Occipital condyles are normally positioned. Skull base and vertebrae: No acute fracture. Soft tissues and spinal canal: No prevertebral fluid or swelling. No visible canal hematoma. Disc levels: No advanced spinal canal or neural foraminal stenosis. Upper chest: No pneumothorax, pulmonary nodule or pleural effusion. Other: Normal visualized paraspinal cervical soft tissues. IMPRESSION: 1. No acute intracranial abnormality. 2. Superior right scalp hematoma without skull fracture. 3. No acute fracture or static subluxation of the cervical spine. Electronically Signed   By: Ulyses Jarred M.D.   On: 02/11/2018 04:12    Microbiology: Recent Results (from the past 240 hour(s))  Urine culture     Status: Abnormal   Collection Time: 02/11/18  4:36 AM  Result Value Ref Range Status   Specimen Description URINE, RANDOM  Final   Special Requests   Final    NONE Performed at [facility], [address] [place], [place] [address]    Culture 80,000 COLONIES/mL ESCHERICHIA COLI (A)  Final   Report Status 02/13/2018 FINAL  Final   Organism ID, Bacteria ESCHERICHIA COLI (A)  Final      Susceptibility   Escherichia coli - MIC*    AMPICILLIN <=2 SENSITIVE Sensitive     CEFAZOLIN <=4 SENSITIVE Sensitive     CEFTRIAXONE <=1 SENSITIVE Sensitive     CIPROFLOXACIN >=4 RESISTANT Resistant     GENTAMICIN <=1 SENSITIVE Sensitive     IMIPENEM <=0.25 SENSITIVE Sensitive     NITROFURANTOIN <=16 SENSITIVE  Sensitive     TRIMETH/SULFA <=20 SENSITIVE Sensitive     AMPICILLIN/SULBACTAM <=2 SENSITIVE Sensitive     PIP/TAZO <=4 SENSITIVE Sensitive     Extended ESBL NEGATIVE Sensitive     * 80,000 COLONIES/mL ESCHERICHIA COLI  Culture, blood (Routine X 2) w Reflex to ID Panel     Status: None (Preliminary result)   Collection Time: 02/11/18  6:35 AM  Result Value Ref Range Status   Specimen Description BLOOD RIGHT ANTECUBITAL  Final   Special Requests   Final    BOTTLES DRAWN AEROBIC AND ANAEROBIC Blood Culture adequate volume   Culture   Final    NO GROWTH 2 DAYS Performed at [facility], [address], [place], [place] [address]    Report Status PENDING  Incomplete  Culture, blood (Routine X 2) w Reflex to ID Panel     Status: None (Preliminary result)   Collection Time: 02/11/18  6:45 AM  Result Value Ref Range Status   Specimen Description BLOOD LEFT HAND  Final   Special Requests   Final    BOTTLES DRAWN AEROBIC AND ANAEROBIC Blood Culture adequate volume   Culture   Final    NO GROWTH 2 DAYS Performed at [facility], [address], [place], [place] [address]    Report Status PENDING  Incomplete     Labs: CBC: Recent Labs  Lab 02/11/18 0340 02/12/18 0656  WBC 6.6 4.5  HGB 12.1 11.2*  HCT 37.7 35.7*  MCV 99.2 97.0  PLT 90* 79*   Basic Metabolic Panel: Recent Labs  Lab 02/11/18 0340 02/12/18 0656  NA 139 141  K 5.1 5.3*  CL 105 110  CO2 26 24  GLUCOSE 140* 116*  BUN 30* 15  CREATININE 1.39* 0.93  CALCIUM 10.7* 9.7   Liver Function Tests: No results for input(s): AST, ALT, ALKPHOS, BILITOT, PROT, ALBUMIN in the last 168 hours. No results for input(s): LIPASE, AMYLASE in the last 168 hours. No results for input(s): AMMONIA in the last 168 hours. Cardiac Enzymes: Recent Labs  Lab 02/11/18 1155 02/11/18 1806  TROPONINI <0.03 <0.03   BNP (last 3 results) Recent Labs    02/11/18 0340  BNP 79.2   CBG: Recent Labs  Lab 02/11/18 1640  02/12/18 0745 02/12/18 1131  GLUCAP 98 98 141*   Time spent: 35 minutes  Signed:  Berle Mull  [facility] 02/12/2018 , 6:04 AM

## 2018-02-16 LAB — CULTURE, BLOOD (ROUTINE X 2)
Culture: NO GROWTH
Culture: NO GROWTH
Special Requests: ADEQUATE
Special Requests: ADEQUATE

## 2018-03-02 LAB — CUP PACEART REMOTE DEVICE CHECK
Battery Impedance: 5066 Ohm
Battery Remaining Longevity: 10 mo
Battery Voltage: 2.66 V
Brady Statistic RV Percent Paced: 40 %
Date Time Interrogation Session: 20191127175313
Implantable Lead Implant Date: 20090112
Implantable Lead Implant Date: 20090112
Implantable Lead Location: 753859
Implantable Lead Location: 753860
Implantable Lead Model: 4092
Implantable Lead Model: 5076
Implantable Pulse Generator Implant Date: 20090112
Lead Channel Impedance Value: 67 Ohm
Lead Channel Pacing Threshold Amplitude: 1.375 V
Lead Channel Pacing Threshold Pulse Width: 0.4 ms
Lead Channel Setting Pacing Amplitude: 2.75 V
Lead Channel Setting Pacing Pulse Width: 0.4 ms
Lead Channel Setting Sensing Sensitivity: 2 mV
MDC IDC MSMT LEADCHNL RV IMPEDANCE VALUE: 516 Ohm

## 2018-03-12 ENCOUNTER — Other Ambulatory Visit: Payer: Self-pay | Admitting: Cardiovascular Disease

## 2018-03-19 ENCOUNTER — Encounter: Payer: Self-pay | Admitting: Nurse Practitioner

## 2018-03-20 ENCOUNTER — Other Ambulatory Visit: Payer: Self-pay

## 2018-03-20 ENCOUNTER — Encounter (HOSPITAL_COMMUNITY): Payer: Self-pay

## 2018-03-20 ENCOUNTER — Emergency Department (HOSPITAL_COMMUNITY)
Admission: EM | Admit: 2018-03-20 | Discharge: 2018-03-20 | Disposition: A | Discharge status: Home / Self Care | Payer: Medicare Other | Attending: Emergency Medicine | Admitting: Emergency Medicine

## 2018-03-20 ENCOUNTER — Emergency Department (HOSPITAL_COMMUNITY): Payer: Medicare Other

## 2018-03-20 DIAGNOSIS — Z7982 Long term (current) use of aspirin: Secondary | ICD-10-CM | POA: Insufficient documentation

## 2018-03-20 DIAGNOSIS — N3001 Acute cystitis with hematuria: Secondary | ICD-10-CM

## 2018-03-20 DIAGNOSIS — R410 Disorientation, unspecified: Secondary | ICD-10-CM

## 2018-03-20 DIAGNOSIS — Z950 Presence of cardiac pacemaker: Secondary | ICD-10-CM | POA: Diagnosis not present

## 2018-03-20 DIAGNOSIS — R1310 Dysphagia, unspecified: Secondary | ICD-10-CM | POA: Diagnosis not present

## 2018-03-20 DIAGNOSIS — Z79899 Other long term (current) drug therapy: Secondary | ICD-10-CM | POA: Diagnosis not present

## 2018-03-20 DIAGNOSIS — I5032 Chronic diastolic (congestive) heart failure: Secondary | ICD-10-CM | POA: Diagnosis not present

## 2018-03-20 DIAGNOSIS — I130 Hypertensive heart and chronic kidney disease with heart failure and stage 1 through stage 4 chronic kidney disease, or unspecified chronic kidney disease: Secondary | ICD-10-CM | POA: Insufficient documentation

## 2018-03-20 DIAGNOSIS — N183 Chronic kidney disease, stage 3 (moderate): Secondary | ICD-10-CM | POA: Insufficient documentation

## 2018-03-20 DIAGNOSIS — Z85828 Personal history of other malignant neoplasm of skin: Secondary | ICD-10-CM | POA: Diagnosis not present

## 2018-03-20 DIAGNOSIS — R441 Visual hallucinations: Secondary | ICD-10-CM | POA: Diagnosis present

## 2018-03-20 DIAGNOSIS — R251 Tremor, unspecified: Secondary | ICD-10-CM | POA: Insufficient documentation

## 2018-03-20 LAB — COMPREHENSIVE METABOLIC PANEL
ALBUMIN: 4 g/dL (ref 3.5–5.0)
ALT: 18 U/L (ref 0–44)
AST: 29 U/L (ref 15–41)
Alkaline Phosphatase: 36 U/L — ABNORMAL LOW (ref 38–126)
Anion gap: 7 (ref 5–15)
BILIRUBIN TOTAL: 0.8 mg/dL (ref 0.3–1.2)
BUN: 29 mg/dL — ABNORMAL HIGH (ref 8–23)
CO2: 25 mmol/L (ref 22–32)
Calcium: 10.6 mg/dL — ABNORMAL HIGH (ref 8.9–10.3)
Chloride: 109 mmol/L (ref 98–111)
Creatinine, Ser: 1.14 mg/dL — ABNORMAL HIGH (ref 0.44–1.00)
GFR calc Af Amer: 50 mL/min — ABNORMAL LOW (ref >60)
GFR calc non Af Amer: 44 mL/min — ABNORMAL LOW (ref >60)
Glucose, Bld: 103 mg/dL — ABNORMAL HIGH (ref 70–99)
Potassium: 4.4 mmol/L (ref 3.5–5.1)
Sodium: 141 mmol/L (ref 135–145)
Total Protein: 6.4 g/dL — ABNORMAL LOW (ref 6.5–8.1)

## 2018-03-20 LAB — CBC
HCT: 40.6 % (ref 36.0–46.0)
Hemoglobin: 12.5 g/dL (ref 12.0–15.0)
MCH: 31.2 pg (ref 26.0–34.0)
MCHC: 30.8 g/dL (ref 30.0–36.0)
MCV: 101.2 fL — ABNORMAL HIGH (ref 80.0–100.0)
Platelets: 84 10*3/uL — ABNORMAL LOW (ref 150–400)
RBC: 4.01 MIL/uL (ref 3.87–5.11)
RDW: 14.3 % (ref 11.5–15.5)
WBC: 5.5 10*3/uL (ref 4.0–10.5)
nRBC: 0 % (ref 0.0–0.2)

## 2018-03-20 LAB — URINALYSIS, ROUTINE W REFLEX MICROSCOPIC
BILIRUBIN URINE: NEGATIVE
Glucose, UA: NEGATIVE mg/dL
Hgb urine dipstick: NEGATIVE
Ketones, ur: 5 mg/dL — AB
Nitrite: NEGATIVE
Protein, ur: NEGATIVE mg/dL
Specific Gravity, Urine: 1.02 (ref 1.005–1.030)
pH: 5 (ref 5.0–8.0)

## 2018-03-20 LAB — LACTIC ACID, PLASMA
Lactic Acid, Venous: 1.4 mmol/L (ref 0.5–1.9)
Lactic Acid, Venous: 0.9 mmol/L (ref 0.5–1.9)

## 2018-03-20 LAB — TROPONIN I
Troponin I: 0.03 ng/mL (ref <0.03)
Troponin I: <0.03 ng/mL (ref <0.03)

## 2018-03-20 LAB — CBG MONITORING, ED: GLUCOSE-CAPILLARY: 89 mg/dL (ref 70–99)

## 2018-03-20 MED ORDER — CEFDINIR 300 MG PO CAPS: 300.0000 mg | ORAL_CAPSULE | Freq: Two times a day (BID) | ORAL | 0 refills | Status: DC

## 2018-03-20 MED ORDER — CEFDINIR 300 MG PO CAPS
300.0000 mg | ORAL_CAPSULE | Freq: Once | ORAL | Status: AC
  Administered 2018-03-20: 300 mg via ORAL
  Filled 2018-03-20: qty 1

## 2018-03-20 MED ORDER — SODIUM CHLORIDE 0.9% FLUSH
3.0000 mL | Freq: Once | INTRAVENOUS | Status: AC
  Administered 2018-03-20: 3 mL via INTRAVENOUS

## 2018-03-20 MED ORDER — SODIUM CHLORIDE 0.9 % IV SOLN: Freq: Once | INTRAVENOUS | Status: DC

## 2018-03-20 MED ORDER — SODIUM CHLORIDE 0.9 % IV BOLUS
1000.0000 mL | Freq: Once | INTRAVENOUS | Status: AC
  Administered 2018-03-20: 1000 mL via INTRAVENOUS

## 2018-03-20 NOTE — ED Triage Notes (Signed)
Per EMS- Patient's family reports increased altered mental status. Patient having visual hallucinations. Patient was recently treated for a UTI.

## 2018-03-20 NOTE — ED Notes (Signed)
Placed pt on external cath.  Pt stated she felt a little raw in vaginal area.

## 2018-03-20 NOTE — ED Notes (Signed)
External cath did not work.  Had to put a new  External cath in place.

## 2018-03-20 NOTE — ED Provider Notes (Signed)
Care assumed from [facility] and Dr. Venora Maples, please see their note for full details, but in brief [name] is a 85 y.o. female who presents today for worsening confusion.  Patient has had multiple UTIs recently and they wondered if this can be contributing they also report dysphagia for which they have a GI appointment coming up next week but they feel that this is worsened recently she is having increasing difficulty swallowing liquids.  No fevers or focal pain, no recent falls or trauma.  Vitals have been stable and initial work-up shows no leukocytosis, aside from slightly elevated creatinine which patient was given fluids for no acute electrolyte derangements requiring intervention, normal liver function.  Troponin was initially 0.03, patient without concerning EKG changes and denies any chest pain.  Lactic acid negative.  Esophagram showed abnormal motility.  Patient has had a worsening tremor recently as well, and previous care team has recommended outpatient neurology follow-up.  Head imaging was deferred as family have specifically stated that they would not wish to intervene on any acute intracranial pathology.  At shift change urinalysis, delta troponin and chest x-ray pending.  Plan:  Labs Reviewed  COMPREHENSIVE METABOLIC PANEL - Abnormal; Notable for the following components:      Result Value   Glucose, Bld 103 (*)    BUN 29 (*)    Creatinine, Ser 1.14 (*)    Calcium 10.6 (*)    Total Protein 6.4 (*)    Alkaline Phosphatase 36 (*)    GFR calc non Af Amer 44 (*)    GFR calc Af Amer 50 (*)    All other components within normal limits  CBC - Abnormal; Notable for the following components:   MCV 101.2 (*)    Platelets 84 (*)    All other components within normal limits  TROPONIN I - Abnormal; Notable for the following components:   Troponin I 0.03 (*)    All other components within normal limits  URINALYSIS, ROUTINE W REFLEX MICROSCOPIC - Abnormal; Notable for the following  components:   APPearance HAZY (*)    Ketones, ur 5 (*)    Leukocytes, UA TRACE (*)    Bacteria, UA RARE (*)    All other components within normal limits  URINE CULTURE  LACTIC ACID, PLASMA  LACTIC ACID, PLASMA  TROPONIN I  CBG MONITORING, ED    Dg Chest 2 View  Result Date: 03/20/2018 CLINICAL DATA:  85 y/o  F; altered mental status. EXAM: CHEST - 2 VIEW COMPARISON:  02/11/2018 chest radiograph FINDINGS: Stable cardiomegaly given projection and technique. Two lead pacemaker. Post median sternotomy with wires in alignment. Aortic atherosclerosis with calcification. Pulmonary venous hypertension. No focal consolidation. No pleural effusion or pneumothorax. No acute osseous abnormality is evident. IMPRESSION: Stable cardiomegaly. Pulmonary venous hypertension. No focal consolidation. Electronically Signed   By: Kristine Garbe M.D.   On: 03/20/2018 17:15   Dg Esophagus W Single Cm (sol Or Thin Ba)  Result Date: 03/20/2018 CLINICAL DATA:  Altered mental status. Urinary tract infection. Dehydration. Difficulty swallowing EXAM: ESOPHOGRAM/BARIUM SWALLOW TECHNIQUE: Single contrast examination was performed using  thin barium. FLUOROSCOPY TIME:  Fluoroscopy Time:  1 minutes, 3 seconds Radiation Exposure Index (if provided by the fluoroscopic device): 3.8 mGy Number of Acquired Spot Images: 0 COMPARISON:  Chest radiograph 02/11/2018 FINDINGS: Prior median sternotomy. Primary peristaltic waves were disrupted in the mid to distal esophagus on 3/4 swallows. There isn't a notable degree of secondary and tertiary contractions.  No appreciable stricture. No significant hernia. Mild distal esophageal fold thickening which may be a manifestation of esophagitis. No ulceration identified. The patient is relatively infirm in the entire exam was performed with patient in the LPO position. IMPRESSION: 1. Disruption of primary peristaltic waves in the mid to distal esophagus on 03/04 swallows, compatible with  nonspecific esophageal dysmotility disorder. 2. No appreciable esophageal stricture. 3. Mild distal esophageal fold thickening which may be a manifestation of esophagitis. 4. Please note that the patient is relatively infirm and the entire exam was performed with the patient lying in the LPO position. Electronically Signed   By: Van Clines M.D.   On: 03/20/2018 14:49    MDM  Repeat troponin is less than 0.03.  Chest x-ray shows stable cardiomegaly and pulmonary venous hypertension but no focal consolidation or acute cardiopulmonary disease.  Patient's urinalysis with trace leukocytes, rare bacteria and some WBCs and RBCs, unfortunately because urinalysis was completed most recently at her PCPs office I am unable to see this in comparison but given patient's symptoms we will treat conservatively, son reports that she tolerated cefdinir very well recently after her hospitalization will treat with this and have patient follow closely with PCP.  Previous care team had already discussed results of esophagogram and other laboratory results, and I have again recommended neurology follow-up as well as patient's planned GI follow-up.  Return precautions discussed.  Family expresses understanding and agreement with plan.   Jacqlyn Larsen, PA-C 03/20/18 Darletta Moll, MD 03/21/18 [phone]

## 2018-03-20 NOTE — ED Notes (Signed)
Bed: WA03 Expected date:  Expected time:  Means of arrival:  Comments: Hold for RES B

## 2018-03-20 NOTE — Discharge Instructions (Addendum)
Work-up today is overall reassuring, urine does show some mild signs of infection, please take cefdinir as directed as she seemed to do well with this medication after her recent hospitalization.  Chest x-ray shows no evidence of pneumonia.  Follow-up with your primary care doctor for recheck of urine.   Esophagram showed some esophageal dysmotility today and given her tremor neurology follow-up will be beneficial in addition to GI follow-up.  Please call to schedule an appointment.  Return to the emergency department if she develops fevers, worsening confusion or any other new or concerning symptoms.

## 2018-03-20 NOTE — ED Provider Notes (Signed)
[facility] DEPT Provider Note   CSN: [id number] Arrival date & time: 03/20/18  1052     History   Chief Complaint Chief Complaint  Patient presents with  . Altered Mental Status    HPI [name] is a 85 y.o. female.  HPI   [name] is a 85 y.o. female, with a history of GERD, CHF, hyperlipidemia, pacemaker, presenting to the ED with altered mental status beginning yesterday.  She is accompanied by her two sons and her daughter at the bedside. Patient has been telling her son, with whom she lives, that she has been seeing 3 women in her house and in her son's room.  This began yesterday.  They wonder if this could be due to an UTI.  She has had 2 UTIs in the last month.  The latest resulted in a 10-day course of antibiotic which she finished 2 days ago.  Her family also expressed concern about difficulty swallowing.  They state patient has been intermittently gagging on some solid foods as well as things like yogurt and grits. She is able to take in most liquids and soups. She had trouble gagging on her medications, but was able to take them with pudding.  A speech therapist evaluated the patient and recommended GI follow up. Patient has an appt with [facility] on Feb 13.   Patient denies having any complaints at this time. Specifically denies fever/chills, chest pain, shortness of breath, cough, abdominal pain, urinary symptoms, N/V/D, dizziness, headache, vision abnormalities, falls/trauma, or any other complaints or distress.     Past Medical History:  Diagnosis Date  . Anxiety   . Arthritis    "mostly my hands" (01/29/2015)  . Basal cell carcinoma of forehead   . Benign essential tremor   . CAP (community acquired pneumonia) 01/29/2015  . CHF (congestive heart failure) ([place])   . Depression   . Edema of both legs 04-23-2013  pt states feet swelling (wears ted hose)   bilateral venous ablation procedures done in IR, L>R  .  GERD (gastroesophageal reflux disease)   . H/O hiatal hernia   . Hyperlipidemia   . Hyperparathyroidism ([place])    MILD--  STABLE  . Hypertension   . Kidney stones   . Osteopenia   . Permanent atrial fibrillation    CARDIOLOGIST-- DR [id number]  . Pneumonia "several times"  . Presence of permanent cardiac pacemaker    LAST PACER CHECK  04-23-2013  IN [facility]  . Right ureteral stone   . S/P mitral valve replacement with bioprosthetic valve    04-03-2009  . Schatzki's ring   . Sick sinus syndrome ([place])    S/P  PACEMAKER  2009  . Thrombocytopenia, immune ([place])    CHRONIC---  HEMOTOLOGIST---  DR ENNEVER  . Venous insufficiency, peripheral   . Wears glasses     Patient Active Problem List   Diagnosis Date Noted  . Fall 02/11/2018  . GERD (gastroesophageal reflux disease) 02/11/2018  . Anxiety 02/11/2018  . CKD (chronic kidney disease), stage III ([place]) 02/11/2018  . UTI (urinary tract infection) 02/11/2018  . Need for prophylactic vaccination and inoculation against influenza 11/19/2015  . Acute encephalopathy 01/31/2015  . Pneumonia, community acquired 01/28/2015  . PNA (pneumonia) 01/28/2015  . Pacemaker 05/06/2014  . Bilateral leg edema 08/20/2013  . S/P mitral valve replacement with bioprosthetic valve - 29 mm Biocor 04/22/2013  . Permanent atrial fibrillation 04/22/2013  . Essential hypertension 04/22/2013  .  Hyperlipidemia 04/22/2013  . Hypercalcemia 11/28/2012  . Ureteral stone 08/02/2012  . Acute renal failure ([place]) 08/02/2012  . Thrombocytopenia ([place]) 01/20/2011  . Chronic diastolic congestive heart failure ([place]) 04/04/2007  . BRONCHOPNEUMONIA ORGANISM UNSPECIFIED 03/13/2007    Past Surgical History:  Procedure Laterality Date  . CARDIAC CATHETERIZATION  03/27/2009   normal coronaries; grade IV Mitral Insuff.  Marland Kitchen CARDIAC CATHETERIZATION  2001  &  2006  . CARDIAC VALVE REPLACEMENT    . CATARACT EXTRACTION W/ INTRAOCULAR LENS  IMPLANT, BILATERAL Bilateral ~ 2012  .  CYSTOSCOPY W/ URETERAL STENT PLACEMENT Left 08/01/2012   Procedure: CYSTOSCOPY WITH RETROGRADE PYELOGRAM/URETERAL STENT PLACEMENT;  Surgeon: Dutch Gray, MD;  Location: [facility];  Service: Urology;  Laterality: Left;  . CYSTOSCOPY WITH URETEROSCOPY Left 08/14/2012   Procedure: LEFT URETEROSCOPY WITH HOLMIUM LASER AND LEFT  STENT PLACEMENT;  Surgeon: Malka So, MD;  Location: [facility];  Service: Urology;  Laterality: Left;  . CYSTOSCOPY WITH URETEROSCOPY AND STENT PLACEMENT Right 04/25/2013   Procedure: RIGHT URETEROSCOPY WITH  STENT PLACEMENT;  Surgeon: Irine Seal, MD;  Location: [facility];  Service: Urology;  Laterality: Right;  . ESOPHAGOGASTRODUODENOSCOPY (EGD) WITH ESOPHAGEAL DILATION  "a couple times" (01/29/2015)  . EXTRACORPOREAL SHOCK WAVE LITHOTRIPSY Left 07-26-2012  . HAND SURGERY Right 05/2011   "no fracture; straightened things up  . HAND SURGERY Left 2014   "no fracture; straightened things up"  . HOLMIUM LASER APPLICATION Left 03/21/5807   Procedure:  HOLMIUM LASER APPLICATION;  Surgeon: Malka So, MD;  Location: [facility];  Service: Urology;  Laterality: Left;  . HOLMIUM LASER APPLICATION Right 9/83/3825   Procedure: HOLMIUM LASER APPLICATION;  Surgeon: Irine Seal, MD;  Location: [facility];  Service: Urology;  Laterality: Right;  . MITRAL VALVE REPLACEMENT WITH CHORDAL PRESEVATION USING BIOPROSTHETIC VALVE/  LIGATION LEFT ATRIAL APPENDAGE/  LEFT-SIDED MAZE PROCEDURE  04-03-2009  DR Ethel Rana,  SERIAL # [id number]  . NM MYOCAR PERF WALL MOTION  03/01/1999   mild lateral ischemia  . PERMANENT PACEMAKER INSERTION  02/26/2007   Dual Chamber Medtronic Programmed VVIR--- Generator [id number]/  #[id number]   . TRANSTHORACIC ECHOCARDIOGRAM  09-07-2012  DR CROITORU   MILD LVH/  EF 55-60%/  MILD TO MODERATE AR/   SEVERE LAE  &  RAE/  MILD TO MODERATE TR/   MITRAL VALVE BIOPROSTHESIS LEAFLET NORMAL,  MODERATE STENOSIS WITH NO SIG. REGURG/  MODERATE RVE  .  VAGINAL HYSTERECTOMY  1982     OB History   No obstetric history on file.      Home Medications    Prior to Admission medications   Medication Sig Start Date End Date Taking? Authorizing Provider  acetaminophen (TYLENOL) 500 MG tablet Take 500-1,000 mg by mouth daily as needed for moderate pain.   Yes [provider]  ALPRAZolam (XANAX) 0.5 MG tablet TAKE 1 TABLET BY MOUTH AT BEDTIME Patient taking differently: Take 0.25-0.5 mg by mouth 2 (two) times daily as needed for anxiety.  03/27/15  Yes Carter, Monica, DO  amLODipine-olmesartan (AZOR) 5-20 MG tablet TAKE 1 TABLET BY MOUTH EVERY DAY 03/12/18  Yes Croitoru, Mihai, MD  aspirin EC 81 MG tablet Take 81 mg by mouth 3 (three) times a week. Mon, Wed, Fri   Yes [provider]  B Complex Vitamins (VITAMIN B COMPLEX PO) Take 1 tablet by mouth daily.    Yes [provider]  cholecalciferol (VITAMIN D) 1000 units tablet Take 2,000  Units by mouth daily.   Yes [provider]  furosemide (LASIX) 40 MG tablet Take 1 tablet (40 mg total) by mouth daily as needed for fluid or edema. Reported on 01/28/2015 02/12/18  Yes Lavina Hamman, MD  KLOR-CON M20 20 MEQ tablet Take 1 tablet (20 mEq total) by mouth daily as needed (take with furosemide or lasix). 02/12/18  Yes Lavina Hamman, MD  Liniments (DEEP BLUE RELIEF EX) Apply 1 application topically 2 (two) times daily as needed (shoulder pain/rotator cuff).   Yes [provider]  Multiple Vitamin (MULTIVITAMIN WITH MINERALS) TABS Take 1 tablet by mouth daily.    Yes [provider]  omeprazole (PRILOSEC) 20 MG capsule Take 20 mg by mouth daily.    Yes [provider]  OVER THE COUNTER MEDICATION Take 1 tablet by mouth 2 (two) times daily. Nutrifuron (immune supplement)   Yes [provider]  Probiotic CAPS Take 1 capsule by mouth daily.   Yes [provider]  sertraline (ZOLOFT) 50 MG tablet Take 25-50 mg by mouth daily.  03/06/18  Yes [provider]  simvastatin (ZOCOR) 10 MG tablet TAKE 1 TABLET BY MOUTH EVERY DAY AT [facility] Patient taking differently: Take 10 mg by mouth daily at 6 PM.  03/30/15  Yes Medina-Vargas, Monina C, NP  docusate sodium (COLACE) 100 MG capsule Take 1 capsule (100 mg total) by mouth 2 (two) times daily. Patient not taking: Reported on 03/20/2018 02/04/15   Elmarie Shiley, MD    Family History Family History  Problem Relation Age of Onset  . Cancer Mother   . Heart attack Father     Social History Social History   Tobacco Use  . Smoking status: Never Smoker  . Smokeless tobacco: Never Used  . Tobacco comment: never used tobacco  Substance Use Topics  . Alcohol use: No    Alcohol/week: 0.0 standard drinks  . Drug use: No     Allergies   Patient has no known allergies.   Review of Systems Review of Systems  Constitutional: Negative for chills, diaphoresis and fever.  HENT: Positive for trouble swallowing. Negative for voice change.   Respiratory: Negative for cough and shortness of breath.   Cardiovascular: Negative for chest pain, palpitations and leg swelling.  Gastrointestinal: Negative for abdominal pain, blood in stool, diarrhea, nausea and vomiting.  Genitourinary: Negative for dysuria, flank pain, frequency and hematuria.  Neurological: Negative for dizziness, syncope, weakness, light-headedness, numbness and headaches.  Psychiatric/Behavioral: Positive for hallucinations.  All other systems reviewed and are negative.    Physical Exam Updated Vital Signs BP (!) 142/61   Pulse 76   Temp 99.7 F (37.6 C) (Rectal)   Resp (!) 29   Ht 5\' 4"  (1.626 m)   Wt 71.7 kg   SpO2 95%   BMI 27.12 kg/m   Physical Exam Vitals signs and nursing note reviewed.  Constitutional:      General: She is not in acute distress.    Appearance: She is well-developed. She is not diaphoretic.  HENT:     Head: Normocephalic and atraumatic.     Right Ear:  Tympanic membrane, ear canal and external ear normal.     Left Ear: Tympanic membrane, ear canal and external ear normal.     Nose: Nose normal.     Mouth/Throat:     Mouth: Mucous membranes are moist.     Pharynx: Oropharynx is clear.  Eyes:     Extraocular Movements: Extraocular  movements intact.     Conjunctiva/sclera: Conjunctivae normal.     Pupils: Pupils are equal, round, and reactive to light.  Neck:     Musculoskeletal: Neck supple.  Cardiovascular:     Rate and Rhythm: Normal rate and regular rhythm.     Pulses: Normal pulses.     Heart sounds: Normal heart sounds.  Pulmonary:     Effort: Pulmonary effort is normal. No respiratory distress.     Breath sounds: Normal breath sounds.  Abdominal:     Palpations: Abdomen is soft.     Tenderness: There is no abdominal tenderness. There is no guarding.  Musculoskeletal:     Right lower leg: No edema.     Left lower leg: No edema.  Lymphadenopathy:     Cervical: No cervical adenopathy.  Skin:    General: Skin is warm and dry.     Capillary Refill: Capillary refill takes less than 2 seconds.  Neurological:     Mental Status: She is alert and oriented to person, place, and time.     Comments: Sensation grossly intact to light touch in the extremities. She was noted to have some slurred speech which the family states has been present for several weeks. No aphasia. Patient handles oral secretions without difficulty. No noted swallowing defects.  Equal grip strength bilaterally. No arm drift. Strength 5/5 in the upper extremities. Strength 5/5 in the bilateral lower extremities. Coordination intact with finger to nose testing. Cranial nerves III-XII grossly intact.  No facial droop.  Patient does have a tremor present, but she and the family state this has been present for at least several months.  Psychiatric:        Mood and Affect: Mood and affect normal.        Speech: Speech normal.        Behavior: Behavior normal.        ED Treatments / Results  Labs (all labs ordered are listed, but only abnormal results are displayed) Labs Reviewed  COMPREHENSIVE METABOLIC PANEL - Abnormal; Notable for the following components:      Result Value   Glucose, Bld 103 (*)    BUN 29 (*)    Creatinine, Ser 1.14 (*)    Calcium 10.6 (*)    Total Protein 6.4 (*)    Alkaline Phosphatase 36 (*)    GFR calc non Af Amer 44 (*)    GFR calc Af Amer 50 (*)    All other components within normal limits  CBC - Abnormal; Notable for the following components:   MCV 101.2 (*)    Platelets 84 (*)    All other components within normal limits  TROPONIN I - Abnormal; Notable for the following components:   Troponin I 0.03 (*)    All other components within normal limits  URINE CULTURE  LACTIC ACID, PLASMA  LACTIC ACID, PLASMA  URINALYSIS, ROUTINE W REFLEX MICROSCOPIC  TROPONIN I  CBG MONITORING, ED    EKG EKG Interpretation  Date/Time:  Tuesday March 20 2018 11:11:16 EST Ventricular Rate:  85 PR Interval:    QRS Duration: 105 QT Interval:  351 QTC Calculation: 398 R Axis:   -34 Text Interpretation:  Atrial fibrillation Ventricular premature complex Left axis deviation Borderline low voltage, extremity leads Minimal ST depression, lateral leads No significant change was found Confirmed by Jola Schmidt [id number]) on 03/20/2018 1:16:56 PM   Radiology Dg Esophagus W Single Cm (sol Or Thin Ba)  Result Date: 03/20/2018 CLINICAL DATA:  Altered mental status. Urinary tract infection. Dehydration. Difficulty swallowing EXAM: ESOPHOGRAM/BARIUM SWALLOW TECHNIQUE: Single contrast examination was performed using  thin barium. FLUOROSCOPY TIME:  Fluoroscopy Time:  1 minutes, 3 seconds Radiation Exposure Index (if provided by the fluoroscopic device): 3.8 mGy Number of Acquired Spot Images: 0 COMPARISON:  Chest radiograph 02/11/2018 FINDINGS: Prior median sternotomy. Primary peristaltic waves were disrupted in the mid to distal  esophagus on 3/4 swallows. There isn't a notable degree of secondary and tertiary contractions. No appreciable stricture. No significant hernia. Mild distal esophageal fold thickening which may be a manifestation of esophagitis. No ulceration identified. The patient is relatively infirm in the entire exam was performed with patient in the LPO position. IMPRESSION: 1. Disruption of primary peristaltic waves in the mid to distal esophagus on 03/04 swallows, compatible with nonspecific esophageal dysmotility disorder. 2. No appreciable esophageal stricture. 3. Mild distal esophageal fold thickening which may be a manifestation of esophagitis. 4. Please note that the patient is relatively infirm and the entire exam was performed with the patient lying in the LPO position. Electronically Signed   By: Van Clines M.D.   On: 03/20/2018 14:49    Procedures Procedures (including critical care time)  Medications Ordered in ED Medications  sodium chloride flush (NS) 0.9 % injection 3 mL (3 mLs Intravenous Given 03/20/18 1152)  sodium chloride 0.9 % bolus 1,000 mL (0 mLs Intravenous Stopped 03/20/18 1543)     Initial Impression / Assessment and Plan / ED Course  I have reviewed the triage vital signs and the nursing notes.  Pertinent labs & imaging results that were available during my care of the patient were reviewed by me and considered in my medical decision making (see chart for details).  Clinical Course as of Mar 20 1608  Tue Mar 20, 2018  1330 Patient's EKG shows tachycardic rhythm.  Suspect this troponin level may be due to increased demand.  Troponin I(!!): 0.03 [SJ]  1537 Consistent with previous values.  Platelets(!): 84 [SJ]  1607 Patient was rechecked.  These vital signs are not accurate.  Pulse rate around 88, irregularly irregular.  No tachypnea or hypoxia noted.  Pulse Rate(!): 31 [SJ]    Clinical Course User Index [SJ] Dondra Rhett C, PA-C    Patient presents with a couple of  complaints.  Her family has concerns about patient speaking about "women in her house."  Of note, patient has had a variety of people in the home, especially since her discharge from the hospital December 2019.  The family also admits she has been declining somewhat since her husband died about 9 months ago, but even more so since her discharge in December. She has some evidence of slow decline that could be age-related.  Symptoms such as her mental decline, tremor, and difficulty with swallowing need further work-up.  No evidence of stricture or mass on esophagram today, however, there was evidence of dysmotility.  These abnormalities will need neurology follow-up.  She was able to pass a swallow screen here in the ED.   I had a discussion with the patient and her family regarding what they would be willing to do in regards to treatment if abnormalities were found.  For instance, the family asked about performing a head CT.  I discussed with them whether or not they would be willing to have interventions performed, such as surgery, if abnormalities were found.  The patient and her family agree they would be unwilling to undergo these interventions.  Findings and plan of care discussed with Jola Schmidt, MD. Dr. Venora Maples personally evaluated and examined this patient.   End of shift patient care handoff report given to Benedetto Goad, PA-C. Plan: Follow up on UA and second troponin. Discharge home. PCP and neurology follow up.   Final Clinical Impressions(s) / ED Diagnoses   Final diagnoses:  Dysphagia    ED Discharge Orders    None       Layla Maw 03/20/18 1631    Jola Schmidt, MD 03/20/18 1655

## 2018-03-21 LAB — URINE CULTURE: Culture: NO GROWTH

## 2018-03-29 ENCOUNTER — Ambulatory Visit: Payer: Medicare Other | Admitting: Nurse Practitioner

## 2018-03-29 ENCOUNTER — Other Ambulatory Visit (HOSPITAL_COMMUNITY): Payer: Self-pay | Admitting: *Deleted

## 2018-03-29 DIAGNOSIS — R1310 Dysphagia, unspecified: Secondary | ICD-10-CM

## 2018-04-11 ENCOUNTER — Ambulatory Visit (INDEPENDENT_AMBULATORY_CARE_PROVIDER_SITE_OTHER): Payer: Medicare Other | Admitting: *Deleted

## 2018-04-11 DIAGNOSIS — I4819 Other persistent atrial fibrillation: Secondary | ICD-10-CM | POA: Diagnosis not present

## 2018-04-11 DIAGNOSIS — I5032 Chronic diastolic (congestive) heart failure: Secondary | ICD-10-CM

## 2018-04-12 ENCOUNTER — Ambulatory Visit (HOSPITAL_COMMUNITY)
Admission: RE | Admit: 2018-04-12 | Discharge: 2018-04-12 | Disposition: A | Discharge status: Home / Self Care | Payer: Medicare Other | Source: Ambulatory Visit | Attending: Physician Assistant | Admitting: Physician Assistant

## 2018-04-12 DIAGNOSIS — R1310 Dysphagia, unspecified: Secondary | ICD-10-CM | POA: Insufficient documentation

## 2018-04-12 LAB — CUP PACEART REMOTE DEVICE CHECK
Battery Impedance: 5993 Ohm
Battery Remaining Longevity: 6 mo
Battery Voltage: 2.63 V
Brady Statistic RV Percent Paced: 40 %
Date Time Interrogation Session: 20200226191305
Implantable Lead Implant Date: 20090112
Implantable Lead Implant Date: 20090112
Implantable Lead Location: 753859
Implantable Lead Location: 753860
Implantable Lead Model: 4092
Implantable Lead Model: 5076
Implantable Pulse Generator Implant Date: 20090112
Lead Channel Impedance Value: 67 Ohm
Lead Channel Pacing Threshold Amplitude: 1.375 V
Lead Channel Pacing Threshold Pulse Width: 0.4 ms
Lead Channel Setting Pacing Amplitude: 2.75 V
Lead Channel Setting Pacing Pulse Width: 0.4 ms
Lead Channel Setting Sensing Sensitivity: 2 mV
MDC IDC MSMT LEADCHNL RV IMPEDANCE VALUE: 527 Ohm

## 2018-04-18 NOTE — Progress Notes (Signed)
Remote pacemaker transmission.   

## 2018-05-03 ENCOUNTER — Telehealth: Payer: Self-pay

## 2018-05-03 NOTE — Telephone Encounter (Signed)
   Primary Cardiologist:  Sanda Klein, MD   Patient contacted.  History reviewed.  No symptoms to suggest any unstable cardiac conditions.  Based on discussion, with current pandemic situation, we will be postponing this appointment for [name].  If symptoms change, she has been instructed to contact our office.   Routing to C19 CANCEL pool for tracking (P CV DIV CV19 CANCEL) and assigning priority (1 = 4-6 wks, 2 = 6-12 wks, 3 = >12 wks).  Donivan Scull, [facility]  05/03/2018 5:26 PM

## 2018-05-03 NOTE — Telephone Encounter (Signed)

## 2018-05-09 ENCOUNTER — Encounter: Payer: Medicare Other | Admitting: Cardiovascular Disease

## 2018-05-11 ENCOUNTER — Telehealth: Payer: Self-pay

## 2018-05-11 NOTE — Telephone Encounter (Signed)
I attempted to reach the pt and left a vm advising Due to current COVID 19 pandemic, our office is severely reducing in office visits for at least the next 2 weeks, in order to minimize the risk to our patients and healthcare providers.   I advised the pt to call back so we could discuss virtual visit or discuss rescheduling her appt.  Pt was advised not to report to the clinic on 05/14/18.

## 2018-05-14 ENCOUNTER — Ambulatory Visit: Payer: Medicare Other | Admitting: Neurology

## 2018-05-14 ENCOUNTER — Encounter

## 2018-05-23 ENCOUNTER — Telehealth: Payer: Self-pay | Admitting: Physician Assistant

## 2018-05-23 NOTE — Telephone Encounter (Signed)
   TELEPHONE CALL NOTE - RESCHEDULING [facility] CANCELLATIONS DUE TO COVID-19  Primary Cardiologist:Mihai Croitoru, MD  This patient has been deemed a candidate for a follow-up tele-health visit to limit community exposure during the Covid-19 pandemic.   Please schedule patient for a telehealth visit - VIDEO preferred - in 2-4 weeks with Dr. Sallyanne Kuster.  I will forward this appointment request to the appropriate scheduling pool and the primary cardiologist's assist. I will remove this patient from the C19 cancellation pool.    Tami Lin Duke, PA 05/23/2018, 8:57 AM

## 2018-07-12 ENCOUNTER — Telehealth: Payer: Self-pay | Admitting: Cardiovascular Disease

## 2018-07-12 ENCOUNTER — Ambulatory Visit (INDEPENDENT_AMBULATORY_CARE_PROVIDER_SITE_OTHER): Payer: Medicare Other | Admitting: *Deleted

## 2018-07-12 DIAGNOSIS — I4819 Other persistent atrial fibrillation: Secondary | ICD-10-CM | POA: Diagnosis not present

## 2018-07-12 NOTE — Telephone Encounter (Signed)
Please schedule on one of the three days were opening up (June 1, 11 or 12). Need to discuss generator change

## 2018-07-12 NOTE — Telephone Encounter (Signed)
Received and reviewed. Pt had 44 VHR episodes, stable from her previous check. Only ventricular marker channels available due to having reached ERI as of 06/16/2018.  Will forward to Dr. Sallyanne Kuster and RN for scheduling.

## 2018-07-12 NOTE — Telephone Encounter (Signed)
New Message   Pt is calling and is wondering if her transmission was received    Please call back

## 2018-07-12 NOTE — Telephone Encounter (Signed)
Routed to [facility] device pool

## 2018-07-13 NOTE — Telephone Encounter (Signed)
Left a message for the patient to call back to set up an appointment.

## 2018-07-13 NOTE — Telephone Encounter (Signed)
The patient has an appointment with Dr. Sallyanne Kuster on 07/27/2018

## 2018-07-15 LAB — CUP PACEART REMOTE DEVICE CHECK
Battery Impedance: 8533 Ohm
Battery Voltage: 2.62 V
Brady Statistic RV Percent Paced: 40 %
Date Time Interrogation Session: 20200528192112
Implantable Lead Implant Date: 20090112
Implantable Lead Implant Date: 20090112
Implantable Lead Location: 753859
Implantable Lead Location: 753860
Implantable Lead Model: 4092
Implantable Lead Model: 5076
Implantable Pulse Generator Implant Date: 20090112
Lead Channel Impedance Value: 527 Ohm
Lead Channel Impedance Value: 67 Ohm
Lead Channel Setting Pacing Amplitude: 3.5 V
Lead Channel Setting Pacing Pulse Width: 0.4 ms
Lead Channel Setting Sensing Sensitivity: 2 mV

## 2018-07-19 ENCOUNTER — Encounter: Payer: Self-pay | Admitting: Cardiology

## 2018-07-19 NOTE — Progress Notes (Signed)
Remote pacemaker transmission.   

## 2018-07-23 ENCOUNTER — Telehealth: Payer: Self-pay | Admitting: Cardiovascular Disease

## 2018-07-23 NOTE — Telephone Encounter (Signed)
Patient returning pre-reg call. Thanks!

## 2018-07-23 NOTE — Telephone Encounter (Signed)
Follow up  ° ° °Patient is returning call.  °

## 2018-07-23 NOTE — Telephone Encounter (Signed)
home phone/ Verbal consent/ my chart declined/ pre reg completed

## 2018-07-27 ENCOUNTER — Telehealth (INDEPENDENT_AMBULATORY_CARE_PROVIDER_SITE_OTHER): Payer: Medicare Other | Admitting: Cardiovascular Disease

## 2018-07-27 ENCOUNTER — Telehealth: Payer: Self-pay | Admitting: *Deleted

## 2018-07-27 ENCOUNTER — Encounter: Payer: Self-pay | Admitting: Cardiovascular Disease

## 2018-07-27 VITALS — BP 113/67 | HR 78 | Ht 63.0 in | Wt 155.0 lb

## 2018-07-27 DIAGNOSIS — Z953 Presence of xenogenic heart valve: Secondary | ICD-10-CM

## 2018-07-27 DIAGNOSIS — I4891 Unspecified atrial fibrillation: Secondary | ICD-10-CM

## 2018-07-27 DIAGNOSIS — Z45010 Encounter for checking and testing of cardiac pacemaker pulse generator [battery]: Secondary | ICD-10-CM

## 2018-07-27 DIAGNOSIS — I130 Hypertensive heart and chronic kidney disease with heart failure and stage 1 through stage 4 chronic kidney disease, or unspecified chronic kidney disease: Secondary | ICD-10-CM

## 2018-07-27 DIAGNOSIS — I5032 Chronic diastolic (congestive) heart failure: Secondary | ICD-10-CM

## 2018-07-27 DIAGNOSIS — Z01818 Encounter for other preprocedural examination: Secondary | ICD-10-CM

## 2018-07-27 DIAGNOSIS — N183 Chronic kidney disease, stage 3 unspecified: Secondary | ICD-10-CM

## 2018-07-27 DIAGNOSIS — E785 Hyperlipidemia, unspecified: Secondary | ICD-10-CM

## 2018-07-27 DIAGNOSIS — I4821 Permanent atrial fibrillation: Secondary | ICD-10-CM

## 2018-07-27 DIAGNOSIS — R600 Localized edema: Secondary | ICD-10-CM | POA: Diagnosis not present

## 2018-07-27 DIAGNOSIS — Z950 Presence of cardiac pacemaker: Secondary | ICD-10-CM

## 2018-07-27 DIAGNOSIS — D696 Thrombocytopenia, unspecified: Secondary | ICD-10-CM

## 2018-07-27 NOTE — Progress Notes (Signed)
Virtual Visit via Telephone Note   This visit type was conducted due to national recommendations for restrictions regarding the COVID-19 Pandemic (e.g. social distancing) in an effort to limit this patient's exposure and mitigate transmission in our community.  Due to her co-morbid illnesses, this patient is at least at moderate risk for complications without adequate follow up.  This format is felt to be most appropriate for this patient at this time.  The patient did not have access to video technology/had technical difficulties with video requiring transitioning to audio format only (telephone).  All issues noted in this document were discussed and addressed.  No physical exam could be performed with this format.  Please refer to the patient's chart for her  consent to telehealth for [facility].   Date:  07/27/2018   ID:  [name], DOB 13-Oct-1929, MRN [id number]  Patient Location: Home Provider Location: Home  PCP:  Lawerance Cruel, MD  Cardiologist:  Sanda Klein, MD  Electrophysiologist:  None   Evaluation Performed:  Follow-Up Visit  Chief Complaint:  Pacemaker ERI  History of Present Illness:    [name] is a 85 y.o. female with with long-standing atrial fibrillation with slow ventricular response and  permanent pacemaker, systemic hypertension and hyperlipidemia, chronic diastolic heart failure and a biological mitral valve prosthesis (2011 29 mm Biocor, Dr. Prescott Gum, with left atrial appendage ligation).She is not receiving anticoagulation due to frequent and serious falls in the past.  She is not pacemaker dependent.  Her last device download showed roughly 40% ventricular pacing.  The pacemaker reached elective replacement indicator on Jun 16, 2018.  Rate control is generally good, with occasional episodes of high ventricular rates that appear to be irregular, likely atrial fibrillation rapid ventricular response.  None of them actually suggestive of  ventricular tachycardia. Her Medtronic Adapta dual-chamber pacemaker was implanted in 2009 but is programmed VVIR for what is now permanent atrial fibrillation.    She was seen in the emergency room in February for confusion, possible UTI.  Otherwise has not had major health problems.  She has not required diuretic dose adjustment.  The patient specifically denies any chest pain at rest exertion, dyspnea at rest or with exertion, orthopnea, paroxysmal nocturnal dyspnea, syncope, palpitations, focal neurological deficits, intermittent claudication, change in lower extremity edema, unexplained weight gain, cough, hemoptysis or wheezing.  The patient does not have symptoms concerning for COVID-19 infection (fever, chills, cough, or new shortness of breath).    Past Medical History:  Diagnosis Date   Anxiety    Arthritis    "mostly my hands" (01/29/2015)   Basal cell carcinoma of forehead    Benign essential tremor    CAP (community acquired pneumonia) 01/29/2015   CHF (congestive heart failure) (HCC)    Depression    Edema of both legs 04-23-2013  pt states feet swelling (wears ted hose)   bilateral venous ablation procedures done in IR, L>R   GERD (gastroesophageal reflux disease)    H/O hiatal hernia    Hyperlipidemia    Hyperparathyroidism ([place])    MILD--  STABLE   Hypertension    Kidney stones    Osteopenia    Permanent atrial fibrillation    CARDIOLOGIST-- DR UMPNTIRW   Pneumonia "several times"   Presence of permanent cardiac pacemaker    LAST PACER CHECK  04-23-2013  IN [facility]   Right ureteral stone    S/P mitral valve replacement with bioprosthetic valve  04-03-2009   Schatzki's ring    Sick sinus syndrome ([place])    S/P  PACEMAKER  2009   Thrombocytopenia, immune ([place])    CHRONIC---  HEMOTOLOGIST---  DR ENNEVER   Venous insufficiency, peripheral    Wears glasses    Past Surgical History:  Procedure Laterality Date   CARDIAC CATHETERIZATION   03/27/2009   normal coronaries; grade IV Mitral Insuff.   CARDIAC CATHETERIZATION  2001  &  2006   CARDIAC VALVE REPLACEMENT     CATARACT EXTRACTION W/ INTRAOCULAR LENS  IMPLANT, BILATERAL Bilateral ~ 2012   CYSTOSCOPY W/ URETERAL STENT PLACEMENT Left 08/01/2012   Procedure: CYSTOSCOPY WITH RETROGRADE PYELOGRAM/URETERAL STENT PLACEMENT;  Surgeon: Dutch Gray, MD;  Location: [facility];  Service: Urology;  Laterality: Left;   CYSTOSCOPY WITH URETEROSCOPY Left 08/14/2012   Procedure: LEFT URETEROSCOPY WITH HOLMIUM LASER AND LEFT  STENT PLACEMENT;  Surgeon: Malka So, MD;  Location: [facility];  Service: Urology;  Laterality: Left;   CYSTOSCOPY WITH URETEROSCOPY AND STENT PLACEMENT Right 04/25/2013   Procedure: RIGHT URETEROSCOPY WITH  STENT PLACEMENT;  Surgeon: Irine Seal, MD;  Location[facility];  Service: Urology;  Laterality: Right;   ESOPHAGOGASTRODUODENOSCOPY (EGD) WITH ESOPHAGEAL DILATION  "a couple times" (01/29/2015)   EXTRACORPOREAL SHOCK WAVE LITHOTRIPSY Left 07-26-2012   HAND SURGERY Right 05/2011   "no fracture; straightened things up   HAND SURGERY Left 2014   "no fracture; straightened things up"   HOLMIUM LASER APPLICATION Left 10/20/452   Procedure:  HOLMIUM LASER APPLICATION;  Surgeon: Malka So, MD;  Location: [facility];  Service: Urology;  Laterality: Left;   HOLMIUM LASER APPLICATION Right 0/98/1191   Procedure: HOLMIUM LASER APPLICATION;  Surgeon: Irine Seal, MD;  Location[facility];  Service: Urology;  Laterality: Right;   MITRAL VALVE REPLACEMENT WITH CHORDAL PRESEVATION USING BIOPROSTHETIC VALVE/  LIGATION LEFT ATRIAL APPENDAGE/  LEFT-SIDED MAZE PROCEDURE  04-03-2009  DR Ethel Rana,  SERIAL # [id number]   NM MYOCAR PERF WALL MOTION  03/01/1999   mild lateral ischemia   PERMANENT PACEMAKER INSERTION  02/26/2007   Dual Chamber Medtronic Programmed VVIR--- Generator [id number]/  [id number]    TRANSTHORACIC ECHOCARDIOGRAM   09-07-2012  DR Rutha Melgoza   MILD LVH/  EF 55-60%/  MILD TO MODERATE AR/   SEVERE LAE  &  RAE/  MILD TO MODERATE TR/   MITRAL VALVE BIOPROSTHESIS LEAFLET NORMAL,  MODERATE STENOSIS WITH NO SIG. REGURG/  MODERATE RVE   VAGINAL HYSTERECTOMY  1982     Current Meds  Medication Sig   acetaminophen (TYLENOL) 500 MG tablet Take 500-1,000 mg by mouth daily as needed for moderate pain.   ALPRAZolam (XANAX) 0.5 MG tablet TAKE 1 TABLET BY MOUTH AT BEDTIME (Patient taking differently: Take 0.25-0.5 mg by mouth 2 (two) times daily as needed for anxiety. )   amLODipine-olmesartan (AZOR) 5-20 MG tablet TAKE 1 TABLET BY MOUTH EVERY DAY   aspirin EC 81 MG tablet Take 81 mg by mouth 3 (three) times a week. Mon, Wed, Fri   B Complex Vitamins (VITAMIN B COMPLEX PO) Take 1 tablet by mouth daily.    cholecalciferol (VITAMIN D) 1000 units tablet Take 2,000 Units by mouth daily.   docusate sodium (COLACE) 100 MG capsule Take 1 capsule (100 mg total) by mouth 2 (two) times daily.   furosemide (LASIX) 40 MG tablet Take 1 tablet (40 mg total) by mouth daily as needed for fluid or edema. Reported  on 01/28/2015   KLOR-CON M20 20 MEQ tablet Take 1 tablet (20 mEq total) by mouth daily as needed (take with furosemide or lasix).   Liniments (DEEP BLUE RELIEF EX) Apply 1 application topically 2 (two) times daily as needed (shoulder pain/rotator cuff).   Multiple Vitamin (MULTIVITAMIN WITH MINERALS) TABS Take 1 tablet by mouth daily.    omeprazole (PRILOSEC) 20 MG capsule Take 20 mg by mouth daily.    Probiotic CAPS Take 1 capsule by mouth daily.   simvastatin (ZOCOR) 10 MG tablet TAKE 1 TABLET BY MOUTH EVERY DAY AT DINNER (Patient taking differently: Take 10 mg by mouth daily at 6 PM. )     Allergies:   Patient has no known allergies.   Social History   Tobacco Use   Smoking status: Never Smoker   Smokeless tobacco: Never Used   Tobacco comment: never used tobacco  Substance Use Topics   Alcohol use:  No    Alcohol/week: 0.0 standard drinks   Drug use: No     Family Hx: The patient's family history includes Cancer in her mother; Heart attack in her father.  ROS:   Please see the history of present illness.     All other systems reviewed and are negative.   Prior CV studies:   The following studies were reviewed today:  Pacemaker download May 31  Labs/Other Tests and Data Reviewed:    EKG:  An ECG dated 03/21/2018 was personally reviewed today and demonstrated:  Atrial fibrillation, left axis deviation  Recent Labs: 02/11/2018: B Natriuretic Peptide 79.2 03/20/2018: ALT 18; BUN 29; Creatinine, Ser 1.14; Hemoglobin 12.5; Platelets 84; Potassium 4.4; Sodium 141   Recent Lipid Panel Lab Results  Component Value Date/Time   CHOL 172 10/02/2015 10:44 AM   TRIG 72 10/02/2015 10:44 AM   HDL 89 10/02/2015 10:44 AM   CHOLHDL 1.9 10/02/2015 10:44 AM   LDLCALC 69 10/02/2015 10:44 AM    Wt Readings from Last 3 Encounters:  07/27/18 155 lb (70.3 kg)  03/20/18 158 lb (71.7 kg)  02/12/18 165 lb 9.6 oz (75.1 kg)     Objective:    Vital Signs:  BP 113/67    Pulse 78    Ht 5\' 3"  (1.6 m)    Wt 155 lb (70.3 kg)    BMI 27.46 kg/m    VITAL SIGNS:  reviewed Unable to examine  ASSESSMENT & PLAN:    1. PM at ERI: Discussed pros and cons of generator change versus allowing the pacemaker battery to the AKA.  Since she has atrial fibrillation it is less likely that she will have profound bradycardia or long pauses that could make her have syncope, but cannot be sure.  She does use the device roughly 40% of the time.  Discussed the potential complications that occur with surgery and the precautions that we are employing to avoid coronavirus exposure.  Patient and her son asked many pertinent questions which were answered.  We decided to go ahead with pacemaker generator change out in the next couple of months. 2. AFib: She has a dual-chamber pacemaker implanted initially for  tachycardia-bradycardia syndrome but is now in permanent atrial fibrillation with a device programmed VVIR.  Despite high embolic risk she is not on anticoagulants due to frequent falls, serious injuries and thrombocytopenia.  She is not receiving any AV blocking agents.  Rarely has RVR, mostly has controlled rate and has 40% pacing. 3. S/P bioMVR: moderately elevated gradients, otherwise normal prosthetic valve function  at last check.  She is not a good candidate for redo surgery. 4. HTN: Excellent blood pressure control   COVID-19 Education: The signs and symptoms of COVID-19 were discussed with the patient and how to seek care for testing (follow up with PCP or arrange E-visit).  The importance of social distancing was discussed today.  Time:   Today, I have spent 25 minutes with the patient with telehealth technology discussing the above problems.     Medication Adjustments/Labs and Tests Ordered: Current medicines are reviewed at length with the patient today.  Concerns regarding medicines are outlined above.   Tests Ordered: No orders of the defined types were placed in this encounter.   Medication Changes: No orders of the defined types were placed in this encounter.   Follow Up: Virtual wound check visit 10-14 days after [employer] ; Virtual Visit 3 months after pacemaker [employer], Sanda Klein, MD  07/27/2018 8:24 AM    [place]

## 2018-07-27 NOTE — Telephone Encounter (Signed)
Call placed to the patient and her son to go over the PPM gen change procedure scheduled for July 6th at 1 pm with Dr. Sallyanne Kuster. While on the phone, medications were updated as well.   Complete pre procedure  lab work on July 2nd. You do not need an appointment for the lab. Once in our office lobby there is a podium where you can sign in and ring the doorbell to alert [place] that you are here. The lab is open from 8:00 am to 4:30 pm; closed for lunch from 12:45pm-1:45pm.  After you have had your lab work at the [facility] office, please proceed to [place]. You will need to have the coronavirus test completed there. You have an appointment on 08/16/2018 at 12:10 pm. This is a drive thru test only. Please be sure to have the lab work completed at the [facility] office before the corona test. You will need to be quarantined after the test until your procedure on 08/16/2018.  Wash your chest and neck with surgical scrub the evening before and the morning of your procedure.  Rinse well. You may pick the srcub up at the [facility] office the day you get your pre procedure labs completed. Please review the surgical scrub instruction sheet given to you.  They have been advised that the instructions will be mailed to them and to call back with amy questions.      COVID-19 Pre-Screening Questions:  . In the past 7 to 10 days have you had a cough,  shortness of breath, headache, congestion, fever (100 or greater) body aches, chills, sore throat, or sudden loss of taste or sense of smell? No . Have you been around anyone with known Covid 19. No . Have you been around anyone who is awaiting Covid 19 test results in the past 7 to 10 days? No . Have you been around anyone who has been exposed to Covid 19, or has mentioned symptoms of Covid 19 within the past 7 to 10 days? No  If you have any concerns/questions about symptoms patients report during screening (either on the phone or at  threshold). Contact the provider seeing the patient or DOD for further guidance.  If neither are available contact a member of the leadership team.

## 2018-07-27 NOTE — H&P (View-Only) (Signed)
Virtual Visit via Telephone Note   This visit type was conducted due to national recommendations for restrictions regarding the COVID-19 Pandemic (e.g. social distancing) in an effort to limit this patient's exposure and mitigate transmission in our community.  Due to her co-morbid illnesses, this patient is at least at moderate risk for complications without adequate follow up.  This format is felt to be most appropriate for this patient at this time.  The patient did not have access to video technology/had technical difficulties with video requiring transitioning to audio format only (telephone).  All issues noted in this document were discussed and addressed.  No physical exam could be performed with this format.  Please refer to the patient's chart for her  consent to telehealth for [facility].   Date:  07/27/2018   ID:  [name], DOB 10-04-1929, MRN [id number]  Patient Location: Home Provider Location: Home  PCP:  Lawerance Cruel, MD  Cardiologist:  Sanda Klein, MD  Electrophysiologist:  None   Evaluation Performed:  Follow-Up Visit  Chief Complaint:  Pacemaker ERI  History of Present Illness:    [name] is a 85 y.o. female with with long-standing atrial fibrillation with slow ventricular response and  permanent pacemaker, systemic hypertension and hyperlipidemia, chronic diastolic heart failure and a biological mitral valve prosthesis (2011 29 mm Biocor, Dr. Prescott Gum, with left atrial appendage ligation).She is not receiving anticoagulation due to frequent and serious falls in the past.  She is not pacemaker dependent.  Her last device download showed roughly 40% ventricular pacing.  The pacemaker reached elective replacement indicator on Jun 16, 2018.  Rate control is generally good, with occasional episodes of high ventricular rates that appear to be irregular, likely atrial fibrillation rapid ventricular response.  None of them actually suggestive of  ventricular tachycardia. Her Medtronic Adapta dual-chamber pacemaker was implanted in 2009 but is programmed VVIR for what is now permanent atrial fibrillation.    She was seen in the emergency room in February for confusion, possible UTI.  Otherwise has not had major health problems.  She has not required diuretic dose adjustment.  The patient specifically denies any chest pain at rest exertion, dyspnea at rest or with exertion, orthopnea, paroxysmal nocturnal dyspnea, syncope, palpitations, focal neurological deficits, intermittent claudication, change in lower extremity edema, unexplained weight gain, cough, hemoptysis or wheezing.  The patient does not have symptoms concerning for COVID-19 infection (fever, chills, cough, or new shortness of breath).    Past Medical History:  Diagnosis Date   Anxiety    Arthritis    "mostly my hands" (01/29/2015)   Basal cell carcinoma of forehead    Benign essential tremor    CAP (community acquired pneumonia) 01/29/2015   CHF (congestive heart failure) (HCC)    Depression    Edema of both legs 04-23-2013  pt states feet swelling (wears ted hose)   bilateral venous ablation procedures done in IR, L>R   GERD (gastroesophageal reflux disease)    H/O hiatal hernia    Hyperlipidemia    Hyperparathyroidism ([place])    MILD--  STABLE   Hypertension    Kidney stones    Osteopenia    Permanent atrial fibrillation    CARDIOLOGIST-- DR [id number]   Pneumonia "several times"   Presence of permanent cardiac pacemaker    LAST PACER CHECK  04-23-2013  IN EPIC   Right ureteral stone    S/P mitral valve replacement with bioprosthetic valve  04-03-2009   Schatzki's ring    Sick sinus syndrome ([place])    S/P  PACEMAKER  2009   Thrombocytopenia, immune ([place])    CHRONIC---  HEMOTOLOGIST---  DR ENNEVER   Venous insufficiency, peripheral    Wears glasses    Past Surgical History:  Procedure Laterality Date   CARDIAC CATHETERIZATION   03/27/2009   normal coronaries; grade IV Mitral Insuff.   CARDIAC CATHETERIZATION  2001  &  2006   CARDIAC VALVE REPLACEMENT     CATARACT EXTRACTION W/ INTRAOCULAR LENS  IMPLANT, BILATERAL Bilateral ~ 2012   CYSTOSCOPY W/ URETERAL STENT PLACEMENT Left 08/01/2012   Procedure: CYSTOSCOPY WITH RETROGRADE PYELOGRAM/URETERAL STENT PLACEMENT;  Surgeon: Dutch Gray, MD;  Location: [facility];  Service: Urology;  Laterality: Left;   CYSTOSCOPY WITH URETEROSCOPY Left 08/14/2012   Procedure: LEFT URETEROSCOPY WITH HOLMIUM LASER AND LEFT  STENT PLACEMENT;  Surgeon: Malka So, MD;  Location: [facility];  Service: Urology;  Laterality: Left;   CYSTOSCOPY WITH URETEROSCOPY AND STENT PLACEMENT Right 04/25/2013   Procedure: RIGHT URETEROSCOPY WITH  STENT PLACEMENT;  Surgeon: Irine Seal, MD;  Location: [facility];  Service: Urology;  Laterality: Right;   ESOPHAGOGASTRODUODENOSCOPY (EGD) WITH ESOPHAGEAL DILATION  "a couple times" (01/29/2015)   EXTRACORPOREAL SHOCK WAVE LITHOTRIPSY Left 07-26-2012   HAND SURGERY Right 05/2011   "no fracture; straightened things up   HAND SURGERY Left 2014   "no fracture; straightened things up"   HOLMIUM LASER APPLICATION Left 0/04/4740   Procedure:  HOLMIUM LASER APPLICATION;  Surgeon: Malka So, MD;  Location: [facility];  Service: Urology;  Laterality: Left;   HOLMIUM LASER APPLICATION Right 5/95/6387   Procedure: HOLMIUM LASER APPLICATION;  Surgeon: Irine Seal, MD;  Location[facility];  Service: Urology;  Laterality: Right;   MITRAL VALVE REPLACEMENT WITH CHORDAL PRESEVATION USING BIOPROSTHETIC VALVE/  LIGATION LEFT ATRIAL APPENDAGE/  LEFT-SIDED MAZE PROCEDURE  04-03-2009  DR Ethel Rana,  SERIAL # [id number]   NM MYOCAR PERF WALL MOTION  03/01/1999   mild lateral ischemia   PERMANENT PACEMAKER INSERTION  02/26/2007   Dual Chamber Medtronic Programmed VVIR--- Generator [id number]/  [id number]    TRANSTHORACIC ECHOCARDIOGRAM   09-07-2012  DR Brinsley Wence   MILD LVH/  EF 55-60%/  MILD TO MODERATE AR/   SEVERE LAE  &  RAE/  MILD TO MODERATE TR/   MITRAL VALVE BIOPROSTHESIS LEAFLET NORMAL,  MODERATE STENOSIS WITH NO SIG. REGURG/  MODERATE RVE   VAGINAL HYSTERECTOMY  1982     Current Meds  Medication Sig   acetaminophen (TYLENOL) 500 MG tablet Take 500-1,000 mg by mouth daily as needed for moderate pain.   ALPRAZolam (XANAX) 0.5 MG tablet TAKE 1 TABLET BY MOUTH AT BEDTIME (Patient taking differently: Take 0.25-0.5 mg by mouth 2 (two) times daily as needed for anxiety. )   amLODipine-olmesartan (AZOR) 5-20 MG tablet TAKE 1 TABLET BY MOUTH EVERY DAY   aspirin EC 81 MG tablet Take 81 mg by mouth 3 (three) times a week. Mon, Wed, Fri   B Complex Vitamins (VITAMIN B COMPLEX PO) Take 1 tablet by mouth daily.    cholecalciferol (VITAMIN D) 1000 units tablet Take 2,000 Units by mouth daily.   docusate sodium (COLACE) 100 MG capsule Take 1 capsule (100 mg total) by mouth 2 (two) times daily.   furosemide (LASIX) 40 MG tablet Take 1 tablet (40 mg total) by mouth daily as needed for fluid or edema. Reported  on 01/28/2015   KLOR-CON M20 20 MEQ tablet Take 1 tablet (20 mEq total) by mouth daily as needed (take with furosemide or lasix).   Liniments (DEEP BLUE RELIEF EX) Apply 1 application topically 2 (two) times daily as needed (shoulder pain/rotator cuff).   Multiple Vitamin (MULTIVITAMIN WITH MINERALS) TABS Take 1 tablet by mouth daily.    omeprazole (PRILOSEC) 20 MG capsule Take 20 mg by mouth daily.    Probiotic CAPS Take 1 capsule by mouth daily.   simvastatin (ZOCOR) 10 MG tablet TAKE 1 TABLET BY MOUTH EVERY DAY AT DINNER (Patient taking differently: Take 10 mg by mouth daily at 6 PM. )     Allergies:   Patient has no known allergies.   Social History   Tobacco Use   Smoking status: Never Smoker   Smokeless tobacco: Never Used   Tobacco comment: never used tobacco  Substance Use Topics   Alcohol use:  No    Alcohol/week: 0.0 standard drinks   Drug use: No     Family Hx: The patient's family history includes Cancer in her mother; Heart attack in her father.  ROS:   Please see the history of present illness.     All other systems reviewed and are negative.   Prior CV studies:   The following studies were reviewed today:  Pacemaker download May 31  Labs/Other Tests and Data Reviewed:    EKG:  An ECG dated 03/21/2018 was personally reviewed today and demonstrated:  Atrial fibrillation, left axis deviation  Recent Labs: 02/11/2018: B Natriuretic Peptide 79.2 03/20/2018: ALT 18; BUN 29; Creatinine, Ser 1.14; Hemoglobin 12.5; Platelets 84; Potassium 4.4; Sodium 141   Recent Lipid Panel Lab Results  Component Value Date/Time   CHOL 172 10/02/2015 10:44 AM   TRIG 72 10/02/2015 10:44 AM   HDL 89 10/02/2015 10:44 AM   CHOLHDL 1.9 10/02/2015 10:44 AM   LDLCALC 69 10/02/2015 10:44 AM    Wt Readings from Last 3 Encounters:  07/27/18 155 lb (70.3 kg)  03/20/18 158 lb (71.7 kg)  02/12/18 165 lb 9.6 oz (75.1 kg)     Objective:    Vital Signs:  BP 113/67    Pulse 78    Ht 5\' 3"  (1.6 m)    Wt 155 lb (70.3 kg)    BMI 27.46 kg/m    VITAL SIGNS:  reviewed Unable to examine  ASSESSMENT & PLAN:    1. PM at ERI: Discussed pros and cons of generator change versus allowing the pacemaker battery to the AKA.  Since she has atrial fibrillation it is less likely that she will have profound bradycardia or long pauses that could make her have syncope, but cannot be sure.  She does use the device roughly 40% of the time.  Discussed the potential complications that occur with surgery and the precautions that we are employing to avoid coronavirus exposure.  Patient and her son asked many pertinent questions which were answered.  We decided to go ahead with pacemaker generator change out in the next couple of months. 2. AFib: She has a dual-chamber pacemaker implanted initially for  tachycardia-bradycardia syndrome but is now in permanent atrial fibrillation with a device programmed VVIR.  Despite high embolic risk she is not on anticoagulants due to frequent falls, serious injuries and thrombocytopenia.  She is not receiving any AV blocking agents.  Rarely has RVR, mostly has controlled rate and has 40% pacing. 3. S/P bioMVR: moderately elevated gradients, otherwise normal prosthetic valve function  at last check.  She is not a good candidate for redo surgery. 4. HTN: Excellent blood pressure control   COVID-19 Education: The signs and symptoms of COVID-19 were discussed with the patient and how to seek care for testing (follow up with PCP or arrange E-visit).  The importance of social distancing was discussed today.  Time:   Today, I have spent 25 minutes with the patient with telehealth technology discussing the above problems.     Medication Adjustments/Labs and Tests Ordered: Current medicines are reviewed at length with the patient today.  Concerns regarding medicines are outlined above.   Tests Ordered: No orders of the defined types were placed in this encounter.   Medication Changes: No orders of the defined types were placed in this encounter.   Follow Up: Virtual wound check visit 10-14 days after [employer] ; Virtual Visit 3 months after pacemaker [employer], Sanda Klein, MD  07/27/2018 8:24 AM    [name]

## 2018-07-27 NOTE — Patient Instructions (Addendum)
Medication Instructions:   Your physician recommends that you continue on your current medications as directed. Please refer to the Current Medication list given to you today.  * If you need a refill on your cardiac medications before your next appointment, please call your pharmacy. *   Labwork: Your provider would like for you to return on July 2nd to have the following labs drawn: BMET and CBC with diff. You do not need an appointment for the lab. Once in our office lobby there is a podium where you can sign in and ring the doorbell to alert [place] that you are here. The lab is open from 8:00 am to 4:30 pm; closed for lunch from 12:45pm-1:45pm.  After you have had your lab work at the [employer] office, please proceed to [place]. You will need to have the coronavirus test completed there. You have an appointment on 08/16/2018 at 12:10 pm. This is a drive thru test only. Please be sure to have the lab work completed at the [employer] office before the corona test. You will need to be quarantined after the test until your procedure on 08/16/2018.   Testing/Procedures: Your physician has recommended that you have a pacemaker/defibrillator generator change (battery change). Please follow the instructions below, located under the special instructions section.   Follow-Up: Your physician recommends that you schedule a wound check appointment 10-14 days, after your procedure on July 6th, with the device clinic. We will call you to set this up.  Your physician recommends that you schedule a follow up appointment in 91 days, after your procedure on July 6th, with Dr. Sallyanne Kuster. We will call you to set this up.   Any Other Special Instructions Will Be Listed Below (If Applicable).     Implantable Device Instructions  You are scheduled for:                  _____ Generator Change (battery change)  on  08/20/2018  with Dr. Sallyanne Kuster.  1.   Please arrive at the [facility],  Entrance "A"  at [facility] at  11 am on the day of your procedure. (The address is [address]  2. Do not eat or drink after midnight the night before your procedure.  3.   Complete pre procedure  lab work on July 2nd. You do not need an appointment for the lab. Once in our office lobby there is a podium where you can sign in and ring the doorbell to alert [place] that you are here. The lab is open from 8:00 am to 4:30 pm; closed for lunch from 12:45pm-1:45pm.  After you have had your lab work at the [employer] office, please proceed to [place]. You will need to have the coronavirus test completed there. You have an appointment on 08/16/2018 at 12:10 pm. This is a drive thru test only. Please be sure to have the lab work completed at the [employer] office before the corona test. You will need to be quarantined after the test until your procedure on 08/16/2018.  4.  No medications to hold.   5.  Plan for an overnight stay.  Bring your insurance cards and a list of you medications.  6.  Wash your chest and neck with surgical scrub the evening before and the morning of your procedure.  Rinse well. You may pick the srcub up at the [facility] office the day you get your pre procedure labs completed.  Please review the surgical scrub instruction sheet given to you.  7. Your chest will need to be shaved prior to this procedure (if needed). We ask that you do this yourself at home 1 to 2 days before or if uncomfortable/unable to do yourself, then it will be performed by the hospital staff the day of.                                                                                                         * If you have ANY questions after you get home, please call Lattie Haw, RN @ [phone].  * Every attempt is made to prevent procedures from being rescheduled.  Due to the nature of  Electrophysiology, rescheduling can happen.  The physician is always aware and directs  the staff when this occurs.      Supplemental Discharge Instructions for  Pacemaker/Defibrillator Patients  ACTIVITY No heavy lifting or vigorous activity with your left/right arm for 6 to 8 weeks.  Do not raise your left/right arm above your head for one week.  Gradually raise your affected arm as drawn below.             WOUND CARE - Keep the wound area clean and dry.  Do not get this area wet for one week. No showers for one week . - The tape/steri-strips on your wound will fall off; do not pull them off.  No bandage is needed on the site.  DO  NOT apply any creams, oils, or ointments to the wound area. - If you notice any drainage or discharge from the wound, any swelling or bruising at the site, or you develop a fever > 101? F after you are discharged home, call the office at once.  SPECIAL INSTRUCTIONS - You are still able to use cellular telephones; use the ear opposite the side where you have your pacemaker/defibrillator.  Avoid carrying your cellular phone near your device. - When traveling through airports, show security personnel your identification card to avoid being screened in the metal detectors.  Ask the security personnel to use the hand wand. - Avoid arc welding equipment, MRI testing (magnetic resonance imaging), TENS units (transcutaneous nerve stimulators).  Call the office for questions about other devices. - Avoid electrical appliances that are in poor condition or are not properly grounded. - Microwave ovens are safe to be near or to operate.  ADDITIONAL INFORMATION FOR DEFIBRILLATOR PATIENTS SHOULD YOUR DEVICE GO OFF: - If your device goes off ONCE and you feel fine afterward, notify the device clinic nurses. - If your device goes off ONCE and you do not feel well afterward, call 911. - If your device goes off TWICE, call 911. - If your device goes off [place], call 911.  DO NOT DRIVE YOURSELF OR A FAMILY MEMBER WITH A DEFIBRILLATOR TO THE  HOSPITAL--CALL 911.   [facility] - Preparing For Surgery  Before surgery, you can play an important role. Because skin is not sterile, your skin needs to be as free of germs  as possible. You can reduce the number of germs on your skin by washing with CHG (chlorahexidine gluconate) Soap before surgery.  CHG is an antiseptic cleaner which kills germs and bonds with the skin to continue killing germs even after washing.   Please do not use if you have an allergy to CHG or antibacterial soaps.  If your skin becomes reddened/irritated stop using the CHG.   Do not shave (including legs and underarms) for at least 48 hours prior to first CHG shower.  It is OK to shave your face.  Please follow these instructions carefully:  1.  Shower the night before surgery and the morning of surgery with CHG.  2.  If you choose to wash your hair, wash your hair first as usual with your normal shampoo.  3.  After you shampoo, rinse your hair and body thoroughly to remove the shampoo.  4.  Use CHG as you would any other liquid soap.  You can apply CHG directly to the skin and wash gently with a clean washcloth. 5.  Apply the CHG Soap to your body ONLY FROM THE NECK DOWN.  Do not use on open wounds or open sores.  Avoid contact with your eyes, ears, mouth and genitals (private parts).  Wash genitals (private parts) with your normal soap.  6.  Wash thoroughly, paying special attention to the area where your surgery will be performed.  7.  Thoroughly rise your body with warm water from the neck down.   8.  DO NOT shower/wash with your normal soap after using and rinsing off the CHG soap.  9.  Pat yourself dry with a clean towel.           10.  Wear clean pajamas.           11.  Place clean sheets on your bed the night of your first shower and do not sleep with pets.  Day of Surgery: Do not apply any deodorants/lotions.  Please wear clean clothes to the hospital/surgery center.

## 2018-08-06 ENCOUNTER — Other Ambulatory Visit: Payer: Self-pay | Admitting: Cardiovascular Disease

## 2018-08-07 ENCOUNTER — Ambulatory Visit: Payer: Medicare Other | Admitting: Neurology

## 2018-08-08 ENCOUNTER — Telehealth: Payer: Self-pay | Admitting: Cardiovascular Disease

## 2018-08-08 NOTE — Telephone Encounter (Addendum)
Per pt verbal permission given to speak with son Pt may use walker after PM change out restrictions of limb use not to raise up arm above head and pt may pick up antibacterial soap when goes to have lab work done next week./cy

## 2018-08-08 NOTE — Telephone Encounter (Signed)
New message   Patient has questions about how can use a walker and special soap. Please call to discuss.

## 2018-08-13 ENCOUNTER — Encounter: Payer: Medicare Other | Admitting: Cardiovascular Disease

## 2018-08-15 DIAGNOSIS — I639 Cerebral infarction, unspecified: Secondary | ICD-10-CM

## 2018-08-15 HISTORY — DX: Cerebral infarction, unspecified: I63.9

## 2018-08-16 ENCOUNTER — Other Ambulatory Visit: Payer: Self-pay

## 2018-08-16 ENCOUNTER — Other Ambulatory Visit (HOSPITAL_COMMUNITY)
Admission: RE | Admit: 2018-08-16 | Discharge: 2018-08-16 | Disposition: A | Discharge status: Home / Self Care | Payer: Medicare Other | Source: Ambulatory Visit | Attending: Cardiovascular Disease | Admitting: Cardiovascular Disease

## 2018-08-16 DIAGNOSIS — Z01812 Encounter for preprocedural laboratory examination: Secondary | ICD-10-CM | POA: Insufficient documentation

## 2018-08-16 DIAGNOSIS — Z1159 Encounter for screening for other viral diseases: Secondary | ICD-10-CM | POA: Diagnosis not present

## 2018-08-16 DIAGNOSIS — Z45010 Encounter for checking and testing of cardiac pacemaker pulse generator [battery]: Secondary | ICD-10-CM

## 2018-08-16 DIAGNOSIS — I4821 Permanent atrial fibrillation: Secondary | ICD-10-CM

## 2018-08-16 DIAGNOSIS — N183 Chronic kidney disease, stage 3 unspecified: Secondary | ICD-10-CM

## 2018-08-16 DIAGNOSIS — Z01818 Encounter for other preprocedural examination: Secondary | ICD-10-CM

## 2018-08-16 LAB — SARS CORONAVIRUS 2 (TAT 6-24 HRS): SARS Coronavirus 2: NEGATIVE

## 2018-08-17 LAB — BASIC METABOLIC PANEL
BUN/Creatinine Ratio: 24 (ref 12–28)
BUN: 32 mg/dL — ABNORMAL HIGH (ref 8–27)
CO2: 24 mmol/L (ref 20–29)
Calcium: 11.1 mg/dL — ABNORMAL HIGH (ref 8.7–10.3)
Chloride: 103 mmol/L (ref 96–106)
Creatinine, Ser: 1.36 mg/dL — ABNORMAL HIGH (ref 0.57–1.00)
GFR calc Af Amer: 41 mL/min/{1.73_m2} — ABNORMAL LOW (ref >59)
GFR calc non Af Amer: 35 mL/min/{1.73_m2} — ABNORMAL LOW (ref >59)
Glucose: 94 mg/dL (ref 65–99)
Potassium: 5.5 mmol/L — ABNORMAL HIGH (ref 3.5–5.2)
Sodium: 140 mmol/L (ref 134–144)

## 2018-08-17 LAB — CBC WITH DIFFERENTIAL
Basophils Absolute: 0.1 10*3/uL (ref 0.0–0.2)
Basos: 2 %
EOS (ABSOLUTE): 0.2 10*3/uL (ref 0.0–0.4)
Eos: 3 %
Hematocrit: 38.8 % (ref 34.0–46.6)
Hemoglobin: 12.8 g/dL (ref 11.1–15.9)
Immature Grans (Abs): 0 10*3/uL (ref 0.0–0.1)
Immature Granulocytes: 0 %
Lymphocytes Absolute: 1.3 10*3/uL (ref 0.7–3.1)
Lymphs: 24 %
MCH: 30.7 pg (ref 26.6–33.0)
MCHC: 33 g/dL (ref 31.5–35.7)
MCV: 93 fL (ref 79–97)
Monocytes Absolute: 0.5 10*3/uL (ref 0.1–0.9)
Monocytes: 9 %
Neutrophils Absolute: 3.3 10*3/uL (ref 1.4–7.0)
Neutrophils: 62 %
RBC: 4.17 x10E6/uL (ref 3.77–5.28)
RDW: 12 % (ref 11.7–15.4)
WBC: 5.3 10*3/uL (ref 3.4–10.8)

## 2018-08-20 ENCOUNTER — Ambulatory Visit (HOSPITAL_COMMUNITY)
Admission: RE | Admit: 2018-08-20 | Discharge: 2018-08-20 | Disposition: A | Discharge status: Home / Self Care | Payer: Medicare Other | Attending: Cardiovascular Disease | Admitting: Cardiovascular Disease

## 2018-08-20 ENCOUNTER — Other Ambulatory Visit: Payer: Self-pay

## 2018-08-20 ENCOUNTER — Ambulatory Visit (HOSPITAL_COMMUNITY)
Admission: RE | Disposition: A | Discharge status: Home / Self Care | Payer: Medicare Other | Source: Home / Self Care | Attending: Cardiovascular Disease

## 2018-08-20 DIAGNOSIS — I5032 Chronic diastolic (congestive) heart failure: Secondary | ICD-10-CM | POA: Insufficient documentation

## 2018-08-20 DIAGNOSIS — M8580 Other specified disorders of bone density and structure, unspecified site: Secondary | ICD-10-CM | POA: Diagnosis not present

## 2018-08-20 DIAGNOSIS — M1990 Unspecified osteoarthritis, unspecified site: Secondary | ICD-10-CM | POA: Insufficient documentation

## 2018-08-20 DIAGNOSIS — I4891 Unspecified atrial fibrillation: Secondary | ICD-10-CM

## 2018-08-20 DIAGNOSIS — E785 Hyperlipidemia, unspecified: Secondary | ICD-10-CM | POA: Diagnosis not present

## 2018-08-20 DIAGNOSIS — I872 Venous insufficiency (chronic) (peripheral): Secondary | ICD-10-CM | POA: Diagnosis not present

## 2018-08-20 DIAGNOSIS — Z45010 Encounter for checking and testing of cardiac pacemaker pulse generator [battery]: Secondary | ICD-10-CM | POA: Insufficient documentation

## 2018-08-20 DIAGNOSIS — K219 Gastro-esophageal reflux disease without esophagitis: Secondary | ICD-10-CM | POA: Insufficient documentation

## 2018-08-20 DIAGNOSIS — G250 Essential tremor: Secondary | ICD-10-CM | POA: Insufficient documentation

## 2018-08-20 DIAGNOSIS — E213 Hyperparathyroidism, unspecified: Secondary | ICD-10-CM | POA: Insufficient documentation

## 2018-08-20 DIAGNOSIS — Z79899 Other long term (current) drug therapy: Secondary | ICD-10-CM | POA: Diagnosis not present

## 2018-08-20 DIAGNOSIS — I495 Sick sinus syndrome: Secondary | ICD-10-CM

## 2018-08-20 DIAGNOSIS — I4821 Permanent atrial fibrillation: Secondary | ICD-10-CM | POA: Insufficient documentation

## 2018-08-20 DIAGNOSIS — Z8249 Family history of ischemic heart disease and other diseases of the circulatory system: Secondary | ICD-10-CM | POA: Insufficient documentation

## 2018-08-20 DIAGNOSIS — Z90710 Acquired absence of both cervix and uterus: Secondary | ICD-10-CM | POA: Diagnosis not present

## 2018-08-20 DIAGNOSIS — Z952 Presence of prosthetic heart valve: Secondary | ICD-10-CM | POA: Diagnosis not present

## 2018-08-20 DIAGNOSIS — I110 Hypertensive heart disease with heart failure: Secondary | ICD-10-CM | POA: Insufficient documentation

## 2018-08-20 DIAGNOSIS — D696 Thrombocytopenia, unspecified: Secondary | ICD-10-CM | POA: Diagnosis not present

## 2018-08-20 DIAGNOSIS — Z7982 Long term (current) use of aspirin: Secondary | ICD-10-CM | POA: Diagnosis not present

## 2018-08-20 HISTORY — PX: PPM GENERATOR CHANGEOUT: EP1233

## 2018-08-20 LAB — SURGICAL PCR SCREEN
MRSA, PCR: NEGATIVE
Staphylococcus aureus: POSITIVE — AB

## 2018-08-20 LAB — BASIC METABOLIC PANEL
Anion gap: 7 (ref 5–15)
BUN: 24 mg/dL — ABNORMAL HIGH (ref 8–23)
CO2: 28 mmol/L (ref 22–32)
Calcium: 10.6 mg/dL — ABNORMAL HIGH (ref 8.9–10.3)
Chloride: 107 mmol/L (ref 98–111)
Creatinine, Ser: 1.31 mg/dL — ABNORMAL HIGH (ref 0.44–1.00)
GFR calc Af Amer: 43 mL/min — ABNORMAL LOW (ref >60)
GFR calc non Af Amer: 37 mL/min — ABNORMAL LOW (ref >60)
Glucose, Bld: 97 mg/dL (ref 70–99)
Potassium: 4.4 mmol/L (ref 3.5–5.1)
Sodium: 142 mmol/L (ref 135–145)

## 2018-08-20 SURGERY — PPM GENERATOR CHANGEOUT
Anesthesia: LOCAL

## 2018-08-20 MED ORDER — SODIUM CHLORIDE 0.9 % IV SOLN
80.0000 mg | INTRAVENOUS | Status: AC
  Administered 2018-08-20: 80 mg

## 2018-08-20 MED ORDER — ONDANSETRON HCL 4 MG/2ML IJ SOLN: 4.0000 mg | Freq: Four times a day (QID) | INTRAMUSCULAR | Status: DC | PRN

## 2018-08-20 MED ORDER — SODIUM CHLORIDE 0.9 % IV SOLN
INTRAVENOUS | Status: AC
  Filled 2018-08-20: qty 2

## 2018-08-20 MED ORDER — CHLORHEXIDINE GLUCONATE 4 % EX LIQD
60.0000 mL | Freq: Once | CUTANEOUS | Status: DC
  Filled 2018-08-20: qty 60

## 2018-08-20 MED ORDER — ACETAMINOPHEN 325 MG PO TABS
325.0000 mg | ORAL_TABLET | ORAL | Status: DC | PRN
  Filled 2018-08-20: qty 2

## 2018-08-20 MED ORDER — CEFAZOLIN SODIUM-DEXTROSE 2-4 GM/100ML-% IV SOLN
2.0000 g | INTRAVENOUS | Status: AC
  Administered 2018-08-20: 2 g via INTRAVENOUS
  Filled 2018-08-20: qty 100

## 2018-08-20 MED ORDER — LIDOCAINE HCL 1 % IJ SOLN
INTRAMUSCULAR | Status: AC
  Filled 2018-08-20: qty 60

## 2018-08-20 MED ORDER — SODIUM CHLORIDE 0.9 % IV SOLN
INTRAVENOUS | Status: DC
  Administered 2018-08-20: 12:00:00 via INTRAVENOUS

## 2018-08-20 MED ORDER — LIDOCAINE HCL (PF) 1 % IJ SOLN
INTRAMUSCULAR | Status: DC | PRN
  Administered 2018-08-20: 45 mL

## 2018-08-20 MED ORDER — CEFAZOLIN SODIUM-DEXTROSE 2-4 GM/100ML-% IV SOLN
INTRAVENOUS | Status: AC
  Filled 2018-08-20: qty 100

## 2018-08-20 MED ORDER — MUPIROCIN 2 % EX OINT
TOPICAL_OINTMENT | CUTANEOUS | Status: AC
  Administered 2018-08-20: 1
  Filled 2018-08-20: qty 22

## 2018-08-20 MED ORDER — MUPIROCIN 2 % EX OINT
1.0000 "application " | TOPICAL_OINTMENT | Freq: Once | CUTANEOUS | Status: DC
  Filled 2018-08-20: qty 22

## 2018-08-20 MED ORDER — MIDAZOLAM HCL 5 MG/5ML IJ SOLN
INTRAMUSCULAR | Status: DC | PRN
  Administered 2018-08-20: 0.5 mg via INTRAVENOUS

## 2018-08-20 MED ORDER — MIDAZOLAM HCL 5 MG/5ML IJ SOLN
INTRAMUSCULAR | Status: AC
  Filled 2018-08-20: qty 5

## 2018-08-20 SURGICAL SUPPLY — 5 items
CABLE SURGICAL S-101-97-12 (CABLE) ×2 IMPLANT
IPG PACE AZUR XT DR MRI W1DR01 (Pacemaker) IMPLANT
PACE AZURE XT DR MRI W1DR01 (Pacemaker) ×2 IMPLANT
PAD PRO RADIOLUCENT 2001M-C (PAD) ×2 IMPLANT
TRAY PACEMAKER INSERTION (PACKS) ×2 IMPLANT

## 2018-08-20 NOTE — Discharge Instructions (Signed)
Supplemental Discharge Instructions for  Pacemaker/Defibrillator Patients  Activity No restrictions starting tomorrow. DO wear your seatbelt, even if it crosses over the pacemaker site.  WOUND CARE - Keep the wound area clean and dry.  Remove the dressing 48 hours after the procedure. - DO NOT SUBMERGE UNDER WATER UNTIL FULLY HEALED (no tub baths, hot tubs, swimming pools, etc.).  - You  may shower or take a sponge bath after the dressing is removed. DO NOT SOAK the area and do not allow the shower to directly spray on the site. - If you have tape/steri-strips on your wound, these will fall off; do not pull them off prematurely.   - No bandage is needed on the site.  DO  NOT apply any creams, oils, or ointments to the wound area. - If you notice any drainage or discharge from the wound, any swelling, excessive redness or bruising at the site, or if you develop a fever > 101? F after you are discharged home, call the office at once.  Special Instructions - You are still able to use cellular telephones.  Avoid carrying your cellular phone near your device. - When traveling through airports, show security personnel your identification card to avoid being screened in the metal detectors.  - Avoid arc welding equipment, MRI testing (magnetic resonance imaging), TENS units (transcutaneous nerve stimulators).  Call the office for questions about other devices. - Avoid electrical appliances that are in poor condition or are not properly grounded. - Microwave ovens are safe to be near or to operate.

## 2018-08-20 NOTE — Op Note (Signed)
Procedure report  Procedure performed:  1. Dual chamber pacemaker generator changeout  2. Light sedation  Reason for procedure:  1. Device generator at elective replacement interval  2. History of tachycardia-bradycardia sd. 3. Atrial fibrillation with slow ventricular response.  Procedure performed by:  Sanda Klein, MD  Complications:  None  Estimated blood loss:  <5 mL  Medications administered during procedure:  Ancef 2 g intravenously, lidocaine 1% 30 mL locally, Versed 0.5 mg intravenously Device details:   New Generator Medtronic Azure XT DR MRI model number [id number], serial number [id number] Right atrial lead (chronic) Medtronic , model number [id number], serial [id number] (implanted 02/26/2007) Right ventricular lead (chronic)  Medtronic, model number [id number], serial number [id number] (implanted 02/26/2007)  Explanted generator Medtronic Adapta 2009  Procedure details:  After the risks and benefits of the procedure were discussed the patient provided informed consent. She was brought to the cardiac catheter lab in the fasting state. The patient was prepped and draped in usual sterile fashion. Local anesthesia with 1% lidocaine was administered to to the left infraclavicular area. A 5-6cm horizontal incision was made parallel with and 5 cm caudal to the left clavicle, in the area of an old scar. An older scar was seen closer to the left clavicle, but the device had migrated caudally a substantial amount. Using minimal electrocautery and mostly sharp and blunt dissection the prepectoral pocket was opened carefully to avoid injury to the loops of chronic leads. Extensive dissection was not necessary. The device was explanted. The pocket was carefully inspected for hemostasis and flushed with copious amounts of antibiotic solution.  The leads were disconnected from the old generator and testing of the lead parameters later showed excellent values. The new generator was connected  to the chronic leads, with appropriate pacing noted.   The entire system was then carefully inserted in the pocket with care been taking that the leads and device assumed a comfortable position without pressure on the incision. Great care was taken that the leads be located deep to the generator. The pocket was then closed in layers using 2 layers of 2-0 Vicryl and one of 3-) Vicryl after which a sterile dressing was applied.   At the end of the procedure the following lead parameters were encountered:   Right atrial lead sensed P waves 0.9 mV, impedance 418 ohms, threshold not tested (AFib).  Right ventricular lead sensed R waves  3.6 mV, impedance 380 ohms, threshold 1.5 at 0.4 ms pulse width (1.25V@0 .6 ms).  Sanda Klein, MD, [facility] [phone] office [phone] pager

## 2018-08-20 NOTE — Interval H&P Note (Signed)
History and Physical Interval Note:  08/20/2018 1:08 PM  [name]  has presented today for surgery, with the diagnosis of ERI.  The various methods of treatment have been discussed with the patient and family. After consideration of risks, benefits and other options for treatment, the patient has consented to  Procedure(s): PPM GENERATOR CHANGEOUT (N/A) as a surgical intervention.  The patient's history has been reviewed, patient examined, no change in status, stable for surgery.  I have reviewed the patient's chart and labs.  Questions were answered to the patient's satisfaction.     [name]

## 2018-08-21 ENCOUNTER — Encounter (HOSPITAL_COMMUNITY): Payer: Self-pay | Admitting: Cardiovascular Disease

## 2018-08-21 ENCOUNTER — Telehealth: Payer: Self-pay | Admitting: *Deleted

## 2018-08-21 DIAGNOSIS — R5381 Other malaise: Secondary | ICD-10-CM

## 2018-08-21 DIAGNOSIS — Z9181 History of falling: Secondary | ICD-10-CM

## 2018-08-21 MED FILL — Lidocaine HCl Local Inj 1%: INTRAMUSCULAR | Qty: 60 | Status: AC

## 2018-08-21 NOTE — Telephone Encounter (Signed)
-----   Message from Sanda Klein, MD sent at 08/17/2018  9:05 AM EDT ----- Please stop vitamin D and KCl supplement (high K and Ca blood levels). Recheck BMET when she comes in for PM procedure Monday.

## 2018-08-21 NOTE — Telephone Encounter (Signed)
Patient made aware of results and verbalized understanding.    For now she will stop the Vitamin D but stay on the potassium since her levels were better after the repeat labs.  The daughter feels like the patient may need PT/OT to come to the house to help her mother learn how to adjust to her ADL's while recovering from the procedure. Message routed to the provider for his recommendations.

## 2018-08-21 NOTE — Telephone Encounter (Signed)
I think physical therapy is a great idea and she has not have any restrictions from the pacemaker generator change out point of view.  Yes, please stop vitamin D, but continue same doses of furosemide and potassium as before.

## 2018-08-24 NOTE — Telephone Encounter (Signed)
Left message regarding 91 day post procedure appointment with Dr. Sallyanne Kuster in October, 2020.  That is scheduled for Monday 12/03/18 at 10:00 am---requested return confirmation call from patient and I will also mail an appointment calendar.

## 2018-08-27 ENCOUNTER — Telehealth: Payer: Self-pay | Admitting: Cardiovascular Disease

## 2018-08-27 ENCOUNTER — Telehealth: Payer: Self-pay

## 2018-08-27 NOTE — Telephone Encounter (Signed)
Pt has severe bruising around incision site all down her breast. The drainage of the bandage. Her bruising is purple/bluish, and yellowish. The pt vomited once, had some diarrhea, and feeling nauseas. The pt do not have a fever. Pt daughter also wants to know when she can start putting pressure on her arm to walk with a walker.

## 2018-08-27 NOTE — Telephone Encounter (Signed)
New Message   Patients daughter is requesting a call back in reference to the procedure the patient had. She has had some diarrhea and some bruising around the site.

## 2018-08-27 NOTE — Telephone Encounter (Signed)
Disregard opened in error °

## 2018-08-28 NOTE — Telephone Encounter (Signed)
Spoke with pts daughter. She states she initially had purple bruising that has started "clearing up and turning yellow". She states she has a small amount of dried blood along one steri strip.        She denies fevers, chills, drainage, or active bleeding from the site. She also states the patient had vomiting 2 days after her procedures. She also has had intermittent diarrhea, which is not entirely unusual for her. Today she is nauseated, but no further vomiting. Instructed to continue to monitor for signs of infection.   Instructed to continue not to shower until her wound check 7/16, and no lifting, pushing, pulling, or bearing weight above 10 lbs on her left arm for now. Will address further at wound check 7/16.

## 2018-08-29 ENCOUNTER — Telehealth: Payer: Self-pay

## 2018-08-29 NOTE — Telephone Encounter (Signed)
LMOVM for pt to call my direct office number to do covid-19 prescreening.

## 2018-08-29 NOTE — Telephone Encounter (Signed)
    COVID-19 Pre-Screening Questions:  . In the past 7 to 10 days have you had a cough,  shortness of breath, headache, congestion, fever (100 or greater) body aches, chills, sore throat, or sudden loss of taste or sense of smell? No . Have you been around anyone with known Covid 19. No . Have you been around anyone who is awaiting Covid 19 test results in the past 7 to 10 days? No . Have you been around anyone who has been exposed to Covid 19, or has mentioned symptoms of Covid 19 within the past 7 to 10 days? No  If you have any concerns/questions about symptoms patients report during screening (either on the phone or at threshold). Contact the provider seeing the patient or DOD for further guidance.  If neither are available contact a member of the leadership team.        Pt son answered No to all Covid-19 prescreening questions. I told the pt will need to wear a mask. I told him if the pt can physically come to the appointment alone we ask that she do because we are limiting the amount of people coming into the office. I told him if the pt has to be push into the office that the person who is helping her will also have to wear a mask. I told him if anything changes between now and her appointment time to call and let us know. Pt son verbalized understanding.

## 2018-08-30 ENCOUNTER — Other Ambulatory Visit: Payer: Self-pay

## 2018-08-30 ENCOUNTER — Ambulatory Visit (INDEPENDENT_AMBULATORY_CARE_PROVIDER_SITE_OTHER): Payer: Medicare Other | Admitting: *Deleted

## 2018-08-30 DIAGNOSIS — I4891 Unspecified atrial fibrillation: Secondary | ICD-10-CM

## 2018-08-30 NOTE — Progress Notes (Signed)
Wound check appointment. Steri-strips removed. Wound without redness or edema. Incision edges approximated, wound well healed. Normal device function. Thresholds, sensing, and impedances consistent with implant measurements. Device programmed at 3.5V/auto capture programmed on for extra safety margin until 3 month visit. Histogram distribution appropriate for patient and level of activity. No mode switches or high ventricular rates noted. Patient educated about wound care, arm mobility, lifting restrictions. Remote pacer check 11/29/18.ROV 12/03/18 with Dr Recardo Evangelist.

## 2018-08-31 ENCOUNTER — Encounter (HOSPITAL_BASED_OUTPATIENT_CLINIC_OR_DEPARTMENT_OTHER): Payer: Self-pay

## 2018-08-31 ENCOUNTER — Telehealth: Payer: Self-pay | Admitting: Cardiovascular Disease

## 2018-08-31 ENCOUNTER — Other Ambulatory Visit: Payer: Self-pay

## 2018-08-31 ENCOUNTER — Emergency Department (HOSPITAL_BASED_OUTPATIENT_CLINIC_OR_DEPARTMENT_OTHER): Payer: Medicare Other

## 2018-08-31 ENCOUNTER — Emergency Department (HOSPITAL_BASED_OUTPATIENT_CLINIC_OR_DEPARTMENT_OTHER)
Admission: EM | Admit: 2018-08-31 | Discharge: 2018-08-31 | Disposition: A | Discharge status: Home / Self Care | Payer: Medicare Other | Source: Home / Self Care | Attending: Emergency Medicine | Admitting: Emergency Medicine

## 2018-08-31 DIAGNOSIS — I509 Heart failure, unspecified: Secondary | ICD-10-CM | POA: Insufficient documentation

## 2018-08-31 DIAGNOSIS — I130 Hypertensive heart and chronic kidney disease with heart failure and stage 1 through stage 4 chronic kidney disease, or unspecified chronic kidney disease: Secondary | ICD-10-CM | POA: Insufficient documentation

## 2018-08-31 DIAGNOSIS — N3001 Acute cystitis with hematuria: Secondary | ICD-10-CM

## 2018-08-31 DIAGNOSIS — Z79899 Other long term (current) drug therapy: Secondary | ICD-10-CM | POA: Insufficient documentation

## 2018-08-31 DIAGNOSIS — R410 Disorientation, unspecified: Secondary | ICD-10-CM | POA: Insufficient documentation

## 2018-08-31 DIAGNOSIS — Z85828 Personal history of other malignant neoplasm of skin: Secondary | ICD-10-CM | POA: Insufficient documentation

## 2018-08-31 DIAGNOSIS — R300 Dysuria: Secondary | ICD-10-CM | POA: Insufficient documentation

## 2018-08-31 DIAGNOSIS — N183 Chronic kidney disease, stage 3 (moderate): Secondary | ICD-10-CM | POA: Insufficient documentation

## 2018-08-31 DIAGNOSIS — Z950 Presence of cardiac pacemaker: Secondary | ICD-10-CM | POA: Insufficient documentation

## 2018-08-31 DIAGNOSIS — R7881 Bacteremia: Secondary | ICD-10-CM | POA: Diagnosis not present

## 2018-08-31 DIAGNOSIS — N3000 Acute cystitis without hematuria: Secondary | ICD-10-CM | POA: Diagnosis not present

## 2018-08-31 LAB — COMPREHENSIVE METABOLIC PANEL
ALT: 15 U/L (ref 0–44)
AST: 25 U/L (ref 15–41)
Albumin: 4.1 g/dL (ref 3.5–5.0)
Alkaline Phosphatase: 54 U/L (ref 38–126)
Anion gap: 14 (ref 5–15)
BUN: 24 mg/dL — ABNORMAL HIGH (ref 8–23)
CO2: 23 mmol/L (ref 22–32)
Calcium: 11.3 mg/dL — ABNORMAL HIGH (ref 8.9–10.3)
Chloride: 101 mmol/L (ref 98–111)
Creatinine, Ser: 1.35 mg/dL — ABNORMAL HIGH (ref 0.44–1.00)
GFR calc Af Amer: 41 mL/min — ABNORMAL LOW (ref >60)
GFR calc non Af Amer: 35 mL/min — ABNORMAL LOW (ref >60)
Glucose, Bld: 116 mg/dL — ABNORMAL HIGH (ref 70–99)
Potassium: 4.1 mmol/L (ref 3.5–5.1)
Sodium: 138 mmol/L (ref 135–145)
Total Bilirubin: 1 mg/dL (ref 0.3–1.2)
Total Protein: 6.6 g/dL (ref 6.5–8.1)

## 2018-08-31 LAB — CUP PACEART INCLINIC DEVICE CHECK
Battery Remaining Longevity: 156 mo
Battery Voltage: 3.22 V
Brady Statistic AP VP Percent: 0 %
Brady Statistic AP VS Percent: 0 %
Brady Statistic AS VP Percent: 58.23 %
Brady Statistic AS VS Percent: 41.77 %
Brady Statistic RA Percent Paced: 0 %
Brady Statistic RV Percent Paced: 58.23 %
Date Time Interrogation Session: 20200716151853
Implantable Lead Implant Date: 20090112
Implantable Lead Implant Date: 20090112
Implantable Lead Location: 753859
Implantable Lead Location: 753860
Implantable Lead Model: 4092
Implantable Lead Model: 5076
Implantable Pulse Generator Implant Date: 20200706
Lead Channel Impedance Value: 342 Ohm
Lead Channel Impedance Value: 380 Ohm
Lead Channel Impedance Value: 418 Ohm
Lead Channel Impedance Value: 532 Ohm
Lead Channel Pacing Threshold Amplitude: 1.5 V
Lead Channel Pacing Threshold Pulse Width: 0.4 ms
Lead Channel Sensing Intrinsic Amplitude: 0.875 mV
Lead Channel Sensing Intrinsic Amplitude: 5.125 mV
Lead Channel Setting Pacing Amplitude: 2.5 V
Lead Channel Setting Pacing Pulse Width: 0.4 ms
Lead Channel Setting Sensing Sensitivity: 1.2 mV

## 2018-08-31 LAB — CBC
HCT: 42.5 % (ref 36.0–46.0)
Hemoglobin: 13.1 g/dL (ref 12.0–15.0)
MCH: 30.3 pg (ref 26.0–34.0)
MCHC: 30.8 g/dL (ref 30.0–36.0)
MCV: 98.4 fL (ref 80.0–100.0)
Platelets: 111 10*3/uL — ABNORMAL LOW (ref 150–400)
RBC: 4.32 MIL/uL (ref 3.87–5.11)
RDW: 13.2 % (ref 11.5–15.5)
WBC: 7.2 10*3/uL (ref 4.0–10.5)
nRBC: 0 % (ref 0.0–0.2)

## 2018-08-31 LAB — URINALYSIS, MICROSCOPIC (REFLEX): WBC, UA: >50 WBC/hpf (ref 0–5)

## 2018-08-31 LAB — URINALYSIS, ROUTINE W REFLEX MICROSCOPIC
Bilirubin Urine: NEGATIVE
Glucose, UA: NEGATIVE mg/dL
Ketones, ur: NEGATIVE mg/dL
Nitrite: NEGATIVE
Protein, ur: NEGATIVE mg/dL
Specific Gravity, Urine: 1.015 (ref 1.005–1.030)
pH: 6 (ref 5.0–8.0)

## 2018-08-31 MED ORDER — CEPHALEXIN 500 MG PO CAPS: 500.0000 mg | ORAL_CAPSULE | Freq: Three times a day (TID) | ORAL | 0 refills | Status: DC

## 2018-08-31 MED ORDER — CEPHALEXIN 250 MG PO CAPS
500.0000 mg | ORAL_CAPSULE | Freq: Once | ORAL | Status: AC
  Administered 2018-08-31: 500 mg via ORAL
  Filled 2018-08-31: qty 2

## 2018-08-31 NOTE — ED Triage Notes (Addendum)
Per daughter pt confused and dizzy started yesterday-pt with diarrhea x 3 hours 3 days ago-dysuria x 3 days after the diarrhea-pt taken to tx area via w/c-was able to stand-pt alert to name-age-states "2000"-pt NAD

## 2018-08-31 NOTE — Telephone Encounter (Signed)
Spoke with son who is concerned about PPM.Pt was seen in [facility] yesterday for f/u after gen change. Device function was normal. Concerned because pt  has had generalized weakness for 2 days. Pt has had confusion today. Spoke with pt and pt reports she has not had CP or SHOB. Pt reports generalized weakness. No slurred speech, no aphasia and no greater weakness on either side of her body per son. Pt did report nausea and episodes of diarrhea . PT there with pt and BP 113/76, HR 83, resp 18.   Advised family to call PCP to have pt evaluated, due to increased confusion and generalized weakness.

## 2018-08-31 NOTE — ED Notes (Signed)
Pt assisted to bedside commode. Unable to measure urine output.

## 2018-08-31 NOTE — Telephone Encounter (Signed)
° °  Son of patient called with some follow up questions after his mom's wound check yesterday.

## 2018-08-31 NOTE — ED Notes (Signed)
X-ray at bedside

## 2018-08-31 NOTE — ED Provider Notes (Signed)
[facility] EMERGENCY DEPARTMENT Provider Note   CSN: [id number] Arrival date & time: 08/31/18  1934     History   Chief Complaint Chief Complaint  Patient presents with  . Altered Mental Status    HPI Mattelyn Imhoff is a 85 y.o. female.     HPI 85 year old female was brought to the emergency department with diarrhea and dysuria.  Some mild confusion today.  No reports of vomiting.  No chills or fever.  No chest pain or cough.  No shortness of breath.  Daughter reports the patient has a tremor at baseline but reports the tremor has been more prominent today.  Symptoms are mild in severity.  Went to an urgent care and was told to come the ER for additional work-up.  Past Medical History:  Diagnosis Date  . Anxiety   . Arthritis    "mostly my hands" (01/29/2015)  . Basal cell carcinoma of forehead   . Benign essential tremor   . CAP (community acquired pneumonia) 01/29/2015  . CHF (congestive heart failure) ([place])   . Depression   . Edema of both legs 04-23-2013  pt states feet swelling (wears ted hose)   bilateral venous ablation procedures done in IR, L>R  . GERD (gastroesophageal reflux disease)   . H/O hiatal hernia   . Hyperlipidemia   . Hyperparathyroidism ([place])    MILD--  STABLE  . Hypertension   . Kidney stones   . Osteopenia   . Permanent atrial fibrillation    CARDIOLOGIST-- DR JIRCVELF  . Pneumonia "several times"  . Presence of permanent cardiac pacemaker    LAST PACER CHECK  04-23-2013  IN [facility]  . Right ureteral stone   . S/P mitral valve replacement with bioprosthetic valve    04-03-2009  . Schatzki's ring   . Sick sinus syndrome ([place])    S/P  PACEMAKER  2009  . Thrombocytopenia, immune ([place])    CHRONIC---  HEMOTOLOGIST---  DR ENNEVER  . Venous insufficiency, peripheral   . Wears glasses     Patient Active Problem List   Diagnosis Date Noted  . Tachycardia-bradycardia syndrome ([place])   . Atrial fibrillation with slow ventricular  response ([place])   . Pacemaker battery depletion 08/16/2018  . Fall 02/11/2018  . GERD (gastroesophageal reflux disease) 02/11/2018  . Anxiety 02/11/2018  . CKD (chronic kidney disease), stage III ([place]) 02/11/2018  . UTI (urinary tract infection) 02/11/2018  . Need for prophylactic vaccination and inoculation against influenza 11/19/2015  . Acute encephalopathy 01/31/2015  . Pneumonia, community acquired 01/28/2015  . PNA (pneumonia) 01/28/2015  . Pacemaker 05/06/2014  . Bilateral leg edema 08/20/2013  . S/P mitral valve replacement with bioprosthetic valve - 29 mm Biocor 04/22/2013  . Permanent atrial fibrillation 04/22/2013  . Essential hypertension 04/22/2013  . Hyperlipidemia 04/22/2013  . Hypercalcemia 11/28/2012  . Ureteral stone 08/02/2012  . Acute renal failure ([place]) 08/02/2012  . Thrombocytopenia ([place]) 01/20/2011  . Chronic diastolic congestive heart failure ([place]) 04/04/2007  . BRONCHOPNEUMONIA ORGANISM UNSPECIFIED 03/13/2007    Past Surgical History:  Procedure Laterality Date  . CARDIAC CATHETERIZATION  03/27/2009   normal coronaries; grade IV Mitral Insuff.  Marland Kitchen CARDIAC CATHETERIZATION  2001  &  2006  . CARDIAC VALVE REPLACEMENT    . CATARACT EXTRACTION W/ INTRAOCULAR LENS  IMPLANT, BILATERAL Bilateral ~ 2012  . CYSTOSCOPY W/ URETERAL STENT PLACEMENT Left 08/01/2012   Procedure: CYSTOSCOPY WITH RETROGRADE PYELOGRAM/URETERAL STENT PLACEMENT;  Surgeon: Dutch Gray, MD;  Location: [facility];  Service: Urology;  Laterality: Left;  . CYSTOSCOPY WITH URETEROSCOPY Left 08/14/2012   Procedure: LEFT URETEROSCOPY WITH HOLMIUM LASER AND LEFT  STENT PLACEMENT;  Surgeon: Malka So, MD;  Location: [facility];  Service: Urology;  Laterality: Left;  . CYSTOSCOPY WITH URETEROSCOPY AND STENT PLACEMENT Right 04/25/2013   Procedure: RIGHT URETEROSCOPY WITH  STENT PLACEMENT;  Surgeon: Irine Seal, MD;  Location: [facility];  Service: Urology;  Laterality: Right;  .  ESOPHAGOGASTRODUODENOSCOPY (EGD) WITH ESOPHAGEAL DILATION  "a couple times" (01/29/2015)  . EXTRACORPOREAL SHOCK WAVE LITHOTRIPSY Left 07-26-2012  . HAND SURGERY Right 05/2011   "no fracture; straightened things up  . HAND SURGERY Left 2014   "no fracture; straightened things up"  . HOLMIUM LASER APPLICATION Left 4/0/0867   Procedure:  HOLMIUM LASER APPLICATION;  Surgeon: Malka So, MD;  Location: [facility];  Service: Urology;  Laterality: Left;  . HOLMIUM LASER APPLICATION Right 08/03/5091   Procedure: HOLMIUM LASER APPLICATION;  Surgeon: Irine Seal, MD;  [facility];  Service: Urology;  Laterality: Right;  . MITRAL VALVE REPLACEMENT WITH CHORDAL PRESEVATION USING BIOPROSTHETIC VALVE/  LIGATION LEFT ATRIAL APPENDAGE/  LEFT-SIDED MAZE PROCEDURE  04-03-2009  DR Ethel Rana,  SERIAL # [id number]  . NM MYOCAR PERF WALL MOTION  03/01/1999   mild lateral ischemia  . PERMANENT PACEMAKER INSERTION  02/26/2007   Dual Chamber Medtronic Programmed VVIR--- Generator ADDR01/  #[id number]   . PPM GENERATOR CHANGEOUT N/A 08/20/2018   Procedure: PPM GENERATOR CHANGEOUT;  Surgeon: Sanda Klein, MD;  Location: [facility];  Service: Cardiovascular;  Laterality: N/A;  . TRANSTHORACIC ECHOCARDIOGRAM  09-07-2012  DR CROITORU   MILD LVH/  EF 55-60%/  MILD TO MODERATE AR/   SEVERE LAE  &  RAE/  MILD TO MODERATE TR/   MITRAL VALVE BIOPROSTHESIS LEAFLET NORMAL,  MODERATE STENOSIS WITH NO SIG. REGURG/  MODERATE RVE  . VAGINAL HYSTERECTOMY  1982     OB History   No obstetric history on file.      Home Medications    Prior to Admission medications   Medication Sig Start Date End Date Taking? Authorizing Provider  acetaminophen (TYLENOL) 500 MG tablet Take 500-1,000 mg by mouth daily as needed for moderate pain.    [provider]  ALPRAZolam (XANAX) 0.5 MG tablet TAKE 1 TABLET BY MOUTH AT BEDTIME Patient taking differently: Take 0.25 mg by mouth 2 (two) times  daily.  03/27/15   Gildardo Cranker, DO  amLODipine-olmesartan (AZOR) 5-20 MG tablet TAKE 1 TABLET BY MOUTH EVERY DAY 08/06/18   Croitoru, Mihai, MD  aspirin EC 81 MG tablet Take 81 mg by mouth 3 (three) times a week. Mon, Wed, Fri    [provider]  B Complex Vitamins (VITAMIN B COMPLEX PO) Take 1 tablet by mouth daily.     [provider]  docusate sodium (COLACE) 100 MG capsule Take 1 capsule (100 mg total) by mouth 2 (two) times daily. 02/04/15   Regalado, Belkys A, MD  furosemide (LASIX) 40 MG tablet Take 1 tablet (40 mg total) by mouth daily as needed for fluid or edema. Reported on 01/28/2015 02/12/18   Lavina Hamman, MD  KLOR-CON M20 20 MEQ tablet Take 1 tablet (20 mEq total) by mouth daily as needed (take with furosemide or lasix). 02/12/18   Lavina Hamman, MD  Liniments (DEEP BLUE RELIEF EX) Apply 1 application topically 2 (two) times daily as  needed (shoulder pain/rotator cuff).    [provider]  Multiple Vitamin (MULTIVITAMIN WITH MINERALS) TABS Take 1 tablet by mouth daily.     [provider]  omeprazole (PRILOSEC) 20 MG capsule Take 20 mg by mouth daily.     [provider]  OVER THE COUNTER MEDICATION Take 1 tablet by mouth 2 (two) times daily. Nutrifuron (immune supplement)    [provider]  Probiotic CAPS Take 1 capsule by mouth daily.    [provider]  simvastatin (ZOCOR) 10 MG tablet TAKE 1 TABLET BY MOUTH EVERY DAY AT [facility] Patient taking differently: Take 10 mg by mouth daily at 6 PM.  03/30/15   Medina-Vargas, Monina C, NP    Family History Family History  Problem Relation Age of Onset  . Cancer Mother   . Heart attack Father     Social History Social History   Tobacco Use  . Smoking status: Never Smoker  . Smokeless tobacco: Never Used  . Tobacco comment: never used tobacco  Substance Use Topics  . Alcohol use: No    Alcohol/week: 0.0 standard drinks  . Drug use: No     Allergies    Zoloft [sertraline hcl]   Review of Systems Review of Systems  All other systems reviewed and are negative.    Physical Exam Updated Vital Signs BP (!) 122/55   Pulse 70   Temp 98.6 F (37 C) (Rectal)   Resp (!) 26   SpO2 99%   Physical Exam Vitals signs and nursing note reviewed.  Constitutional:      General: She is not in acute distress.    Appearance: She is well-developed.  HENT:     Head: Normocephalic and atraumatic.  Neck:     Musculoskeletal: Normal range of motion.  Cardiovascular:     Rate and Rhythm: Normal rate and regular rhythm.     Heart sounds: Normal heart sounds.  Pulmonary:     Effort: Pulmonary effort is normal.     Breath sounds: Normal breath sounds.  Abdominal:     General: There is no distension.     Palpations: Abdomen is soft.     Tenderness: There is no abdominal tenderness.  Musculoskeletal: Normal range of motion.  Skin:    General: Skin is warm and dry.  Neurological:     Mental Status: She is alert and oriented to person, place, and time.  Psychiatric:        Judgment: Judgment normal.      ED Treatments / Results  Labs (all labs ordered are listed, but only abnormal results are displayed) Labs Reviewed  CBC - Abnormal; Notable for the following components:      Result Value   Platelets 111 (*)    All other components within normal limits  COMPREHENSIVE METABOLIC PANEL - Abnormal; Notable for the following components:   Glucose, Bld 116 (*)    BUN 24 (*)    Creatinine, Ser 1.35 (*)    Calcium 11.3 (*)    GFR calc non Af Amer 35 (*)    GFR calc Af Amer 41 (*)    All other components within normal limits  URINALYSIS, ROUTINE W REFLEX MICROSCOPIC - Abnormal; Notable for the following components:   APPearance CLOUDY (*)    Hgb urine dipstick SMALL (*)    Leukocytes,Ua SMALL (*)    All other components within normal limits  URINALYSIS, MICROSCOPIC (REFLEX) - Abnormal; Notable for the following components:   Bacteria,  UA  MANY (*)    All other components within normal limits  CULTURE, BLOOD (ROUTINE X 2)  CULTURE, BLOOD (ROUTINE X 2)  URINE CULTURE    EKG None  Radiology Dg Chest Portable 1 View  Result Date: 08/31/2018 CLINICAL DATA:  Weakness, confusion EXAM: PORTABLE CHEST 1 VIEW COMPARISON:  03/20/2018 FINDINGS: Left pacer remains in place, unchanged. Cardiomegaly. Lungs clear. No effusions. No acute bony abnormality. IMPRESSION: Cardiomegaly.  No active disease. Electronically Signed   By: Rolm Baptise M.D.   On: 08/31/2018 20:56    Procedures Procedures (including critical care time)  Medications Ordered in ED Medications  cephALEXin (KEFLEX) capsule 500 mg (500 mg Oral Given 08/31/18 2207)     Initial Impression / Assessment and Plan / ED Course  I have reviewed the triage vital signs and the nursing notes.  Pertinent labs & imaging results that were available during my care of the patient were reviewed by me and considered in my medical decision making (see chart for details).        Overall well-appearing.  This appears to be a urinary tract infection.  Urine culture sent.  Keflex given in the ER.  Home with Keflex.  Overall well-appearing.  Vital signs are stable.  No indication for additional work-up or acute hospitalization at this time.  Home with daughter.  All questions answered.  Patient and family understand to return to the ER for new or worsening symptoms  Final Clinical Impressions(s) / ED Diagnoses   Final diagnoses:  None    ED Discharge Orders    None       Jola Schmidt, MD 08/31/18 2214

## 2018-08-31 NOTE — ED Notes (Signed)
ED Provider at bedside. 

## 2018-09-01 ENCOUNTER — Encounter (HOSPITAL_COMMUNITY): Payer: Self-pay | Admitting: Emergency Medicine

## 2018-09-01 ENCOUNTER — Other Ambulatory Visit: Payer: Self-pay

## 2018-09-01 ENCOUNTER — Inpatient Hospital Stay (HOSPITAL_COMMUNITY)
Admission: EM | Admit: 2018-09-01 | Discharge: 2018-09-06 | DRG: 689 | Disposition: A | Discharge status: Home Health | Payer: Medicare Other | Attending: Internal Medicine | Admitting: Internal Medicine

## 2018-09-01 ENCOUNTER — Telehealth (HOSPITAL_BASED_OUTPATIENT_CLINIC_OR_DEPARTMENT_OTHER): Payer: Self-pay | Admitting: Emergency Medicine

## 2018-09-01 DIAGNOSIS — E213 Hyperparathyroidism, unspecified: Secondary | ICD-10-CM | POA: Diagnosis present

## 2018-09-01 DIAGNOSIS — I513 Intracardiac thrombosis, not elsewhere classified: Secondary | ICD-10-CM | POA: Diagnosis present

## 2018-09-01 DIAGNOSIS — N183 Chronic kidney disease, stage 3 unspecified: Secondary | ICD-10-CM | POA: Diagnosis present

## 2018-09-01 DIAGNOSIS — B951 Streptococcus, group B, as the cause of diseases classified elsewhere: Secondary | ICD-10-CM | POA: Diagnosis not present

## 2018-09-01 DIAGNOSIS — I872 Venous insufficiency (chronic) (peripheral): Secondary | ICD-10-CM | POA: Diagnosis present

## 2018-09-01 DIAGNOSIS — Z952 Presence of prosthetic heart valve: Secondary | ICD-10-CM | POA: Diagnosis not present

## 2018-09-01 DIAGNOSIS — Z8249 Family history of ischemic heart disease and other diseases of the circulatory system: Secondary | ICD-10-CM

## 2018-09-01 DIAGNOSIS — I4891 Unspecified atrial fibrillation: Secondary | ICD-10-CM | POA: Diagnosis present

## 2018-09-01 DIAGNOSIS — E785 Hyperlipidemia, unspecified: Secondary | ICD-10-CM | POA: Diagnosis present

## 2018-09-01 DIAGNOSIS — I495 Sick sinus syndrome: Secondary | ICD-10-CM | POA: Diagnosis present

## 2018-09-01 DIAGNOSIS — Z1623 Resistance to quinolones and fluoroquinolones: Secondary | ICD-10-CM | POA: Diagnosis present

## 2018-09-01 DIAGNOSIS — M8580 Other specified disorders of bone density and structure, unspecified site: Secondary | ICD-10-CM | POA: Diagnosis present

## 2018-09-01 DIAGNOSIS — I330 Acute and subacute infective endocarditis: Secondary | ICD-10-CM | POA: Diagnosis present

## 2018-09-01 DIAGNOSIS — Z8701 Personal history of pneumonia (recurrent): Secondary | ICD-10-CM | POA: Diagnosis not present

## 2018-09-01 DIAGNOSIS — Z7982 Long term (current) use of aspirin: Secondary | ICD-10-CM

## 2018-09-01 DIAGNOSIS — Z9841 Cataract extraction status, right eye: Secondary | ICD-10-CM

## 2018-09-01 DIAGNOSIS — N3000 Acute cystitis without hematuria: Secondary | ICD-10-CM

## 2018-09-01 DIAGNOSIS — I083 Combined rheumatic disorders of mitral, aortic and tricuspid valves: Secondary | ICD-10-CM | POA: Diagnosis present

## 2018-09-01 DIAGNOSIS — I4821 Permanent atrial fibrillation: Secondary | ICD-10-CM | POA: Diagnosis present

## 2018-09-01 DIAGNOSIS — Z950 Presence of cardiac pacemaker: Secondary | ICD-10-CM | POA: Diagnosis present

## 2018-09-01 DIAGNOSIS — Z85828 Personal history of other malignant neoplasm of skin: Secondary | ICD-10-CM

## 2018-09-01 DIAGNOSIS — B954 Other streptococcus as the cause of diseases classified elsewhere: Secondary | ICD-10-CM | POA: Diagnosis present

## 2018-09-01 DIAGNOSIS — I5032 Chronic diastolic (congestive) heart failure: Secondary | ICD-10-CM | POA: Diagnosis present

## 2018-09-01 DIAGNOSIS — K219 Gastro-esophageal reflux disease without esophagitis: Secondary | ICD-10-CM | POA: Diagnosis present

## 2018-09-01 DIAGNOSIS — T82867A Thrombosis of cardiac prosthetic devices, implants and grafts, initial encounter: Secondary | ICD-10-CM | POA: Diagnosis not present

## 2018-09-01 DIAGNOSIS — Z888 Allergy status to other drugs, medicaments and biological substances status: Secondary | ICD-10-CM | POA: Diagnosis not present

## 2018-09-01 DIAGNOSIS — N390 Urinary tract infection, site not specified: Secondary | ICD-10-CM | POA: Diagnosis present

## 2018-09-01 DIAGNOSIS — G250 Essential tremor: Secondary | ICD-10-CM | POA: Diagnosis present

## 2018-09-01 DIAGNOSIS — I236 Thrombosis of atrium, auricular appendage, and ventricle as current complications following acute myocardial infarction: Secondary | ICD-10-CM | POA: Diagnosis not present

## 2018-09-01 DIAGNOSIS — I342 Nonrheumatic mitral (valve) stenosis: Secondary | ICD-10-CM | POA: Diagnosis not present

## 2018-09-01 DIAGNOSIS — Z452 Encounter for adjustment and management of vascular access device: Secondary | ICD-10-CM

## 2018-09-01 DIAGNOSIS — E875 Hyperkalemia: Secondary | ICD-10-CM | POA: Diagnosis present

## 2018-09-01 DIAGNOSIS — R7881 Bacteremia: Secondary | ICD-10-CM | POA: Diagnosis not present

## 2018-09-01 DIAGNOSIS — Z90710 Acquired absence of both cervix and uterus: Secondary | ICD-10-CM

## 2018-09-01 DIAGNOSIS — F419 Anxiety disorder, unspecified: Secondary | ICD-10-CM | POA: Diagnosis present

## 2018-09-01 DIAGNOSIS — B9620 Unspecified Escherichia coli [E. coli] as the cause of diseases classified elsewhere: Secondary | ICD-10-CM | POA: Diagnosis present

## 2018-09-01 DIAGNOSIS — Z9842 Cataract extraction status, left eye: Secondary | ICD-10-CM

## 2018-09-01 DIAGNOSIS — Z1159 Encounter for screening for other viral diseases: Secondary | ICD-10-CM

## 2018-09-01 DIAGNOSIS — D696 Thrombocytopenia, unspecified: Secondary | ICD-10-CM | POA: Diagnosis present

## 2018-09-01 DIAGNOSIS — F329 Major depressive disorder, single episode, unspecified: Secondary | ICD-10-CM | POA: Diagnosis present

## 2018-09-01 DIAGNOSIS — M1990 Unspecified osteoarthritis, unspecified site: Secondary | ICD-10-CM | POA: Diagnosis not present

## 2018-09-01 DIAGNOSIS — Z87442 Personal history of urinary calculi: Secondary | ICD-10-CM

## 2018-09-01 DIAGNOSIS — Z961 Presence of intraocular lens: Secondary | ICD-10-CM | POA: Diagnosis present

## 2018-09-01 DIAGNOSIS — M19042 Primary osteoarthritis, left hand: Secondary | ICD-10-CM | POA: Diagnosis present

## 2018-09-01 DIAGNOSIS — M19041 Primary osteoarthritis, right hand: Secondary | ICD-10-CM | POA: Diagnosis present

## 2018-09-01 DIAGNOSIS — I130 Hypertensive heart and chronic kidney disease with heart failure and stage 1 through stage 4 chronic kidney disease, or unspecified chronic kidney disease: Secondary | ICD-10-CM | POA: Diagnosis present

## 2018-09-01 DIAGNOSIS — Z79899 Other long term (current) drug therapy: Secondary | ICD-10-CM

## 2018-09-01 DIAGNOSIS — Z95828 Presence of other vascular implants and grafts: Secondary | ICD-10-CM | POA: Diagnosis not present

## 2018-09-01 DIAGNOSIS — I5030 Unspecified diastolic (congestive) heart failure: Secondary | ICD-10-CM | POA: Diagnosis not present

## 2018-09-01 LAB — CBC WITH DIFFERENTIAL/PLATELET
Abs Immature Granulocytes: 0.02 10*3/uL (ref 0.00–0.07)
Basophils Absolute: 0.1 10*3/uL (ref 0.0–0.1)
Basophils Relative: 1 %
Eosinophils Absolute: 0.2 10*3/uL (ref 0.0–0.5)
Eosinophils Relative: 4 %
HCT: 42.4 % (ref 36.0–46.0)
Hemoglobin: 13.3 g/dL (ref 12.0–15.0)
Immature Granulocytes: 0 %
Lymphocytes Relative: 26 %
Lymphs Abs: 1.5 10*3/uL (ref 0.7–4.0)
MCH: 30.7 pg (ref 26.0–34.0)
MCHC: 31.4 g/dL (ref 30.0–36.0)
MCV: 97.9 fL (ref 80.0–100.0)
Monocytes Absolute: 0.6 10*3/uL (ref 0.1–1.0)
Monocytes Relative: 10 %
Neutro Abs: 3.3 10*3/uL (ref 1.7–7.7)
Neutrophils Relative %: 59 %
Platelets: 108 10*3/uL — ABNORMAL LOW (ref 150–400)
RBC: 4.33 MIL/uL (ref 3.87–5.11)
RDW: 13.4 % (ref 11.5–15.5)
WBC: 5.7 10*3/uL (ref 4.0–10.5)
nRBC: 0 % (ref 0.0–0.2)

## 2018-09-01 LAB — BLOOD CULTURE ID PANEL (REFLEXED)

## 2018-09-01 LAB — COMPREHENSIVE METABOLIC PANEL
ALT: 13 U/L (ref 0–44)
AST: 23 U/L (ref 15–41)
Albumin: 3.8 g/dL (ref 3.5–5.0)
Alkaline Phosphatase: 51 U/L (ref 38–126)
Anion gap: 9 (ref 5–15)
BUN: 20 mg/dL (ref 8–23)
CO2: 26 mmol/L (ref 22–32)
Calcium: 11 mg/dL — ABNORMAL HIGH (ref 8.9–10.3)
Chloride: 106 mmol/L (ref 98–111)
Creatinine, Ser: 1.44 mg/dL — ABNORMAL HIGH (ref 0.44–1.00)
GFR calc Af Amer: 38 mL/min — ABNORMAL LOW (ref >60)
GFR calc non Af Amer: 33 mL/min — ABNORMAL LOW (ref >60)
Glucose, Bld: 117 mg/dL — ABNORMAL HIGH (ref 70–99)
Potassium: 4.2 mmol/L (ref 3.5–5.1)
Sodium: 141 mmol/L (ref 135–145)
Total Bilirubin: 0.8 mg/dL (ref 0.3–1.2)
Total Protein: 6 g/dL — ABNORMAL LOW (ref 6.5–8.1)

## 2018-09-01 LAB — LACTIC ACID, PLASMA
Lactic Acid, Venous: 2.1 mmol/L (ref 0.5–1.9)
Lactic Acid, Venous: 0.9 mmol/L (ref 0.5–1.9)

## 2018-09-01 MED ORDER — ONDANSETRON HCL 4 MG PO TABS: 4.0000 mg | ORAL_TABLET | Freq: Four times a day (QID) | ORAL | Status: DC | PRN

## 2018-09-01 MED ORDER — SIMVASTATIN 20 MG PO TABS
10.0000 mg | ORAL_TABLET | Freq: Every day | ORAL | Status: DC
  Administered 2018-09-01 – 2018-09-05 (×5): 10 mg via ORAL
  Filled 2018-09-01 (×5): qty 1

## 2018-09-01 MED ORDER — B COMPLEX-C PO TABS
1.0000 | ORAL_TABLET | Freq: Every day | ORAL | Status: DC
  Administered 2018-09-02 – 2018-09-06 (×5): 1 via ORAL
  Filled 2018-09-01 (×6): qty 1

## 2018-09-01 MED ORDER — ENOXAPARIN SODIUM 30 MG/0.3ML ~~LOC~~ SOLN
30.0000 mg | SUBCUTANEOUS | Status: DC
  Administered 2018-09-01 – 2018-09-04 (×4): 30 mg via SUBCUTANEOUS
  Filled 2018-09-01 (×4): qty 0.3

## 2018-09-01 MED ORDER — PANTOPRAZOLE SODIUM 40 MG PO TBEC
40.0000 mg | DELAYED_RELEASE_TABLET | Freq: Every day | ORAL | Status: DC
  Administered 2018-09-02 – 2018-09-06 (×5): 40 mg via ORAL
  Filled 2018-09-01 (×5): qty 1

## 2018-09-01 MED ORDER — SODIUM CHLORIDE 0.9 % IV BOLUS
500.0000 mL | Freq: Once | INTRAVENOUS | Status: AC
  Administered 2018-09-02: 500 mL via INTRAVENOUS

## 2018-09-01 MED ORDER — RISAQUAD PO CAPS
1.0000 | ORAL_CAPSULE | Freq: Every day | ORAL | Status: DC
  Administered 2018-09-02 – 2018-09-06 (×5): 1 via ORAL
  Filled 2018-09-01 (×5): qty 1

## 2018-09-01 MED ORDER — SODIUM CHLORIDE 0.9 % IV SOLN: 1.0000 g | Freq: Once | INTRAVENOUS | Status: DC

## 2018-09-01 MED ORDER — ALPRAZOLAM 0.25 MG PO TABS: 0.2500 mg | ORAL_TABLET | ORAL | Status: DC

## 2018-09-01 MED ORDER — VITAMIN D 25 MCG (1000 UNIT) PO TABS
1000.0000 [IU] | ORAL_TABLET | Freq: Every day | ORAL | Status: DC
  Administered 2018-09-02 – 2018-09-06 (×5): 1000 [IU] via ORAL
  Filled 2018-09-01 (×5): qty 1

## 2018-09-01 MED ORDER — IRBESARTAN 300 MG PO TABS
150.0000 mg | ORAL_TABLET | Freq: Every day | ORAL | Status: DC
  Administered 2018-09-02 – 2018-09-06 (×5): 150 mg via ORAL
  Filled 2018-09-01 (×5): qty 1

## 2018-09-01 MED ORDER — SODIUM CHLORIDE 0.9% FLUSH: 3.0000 mL | Freq: Once | INTRAVENOUS | Status: DC

## 2018-09-01 MED ORDER — ALPRAZOLAM 0.5 MG PO TABS
0.5000 mg | ORAL_TABLET | Freq: Every day | ORAL | Status: DC
  Administered 2018-09-01 – 2018-09-05 (×5): 0.5 mg via ORAL
  Filled 2018-09-01 (×5): qty 1

## 2018-09-01 MED ORDER — ACETAMINOPHEN 500 MG PO TABS: 500.0000 mg | ORAL_TABLET | Freq: Every day | ORAL | Status: DC | PRN

## 2018-09-01 MED ORDER — ASPIRIN EC 81 MG PO TBEC
81.0000 mg | DELAYED_RELEASE_TABLET | ORAL | Status: DC
  Administered 2018-09-03: 81 mg via ORAL
  Filled 2018-09-01 (×3): qty 1

## 2018-09-01 MED ORDER — ONDANSETRON HCL 4 MG/2ML IJ SOLN: 4.0000 mg | Freq: Four times a day (QID) | INTRAMUSCULAR | Status: DC | PRN

## 2018-09-01 MED ORDER — SODIUM CHLORIDE 0.9 % IV SOLN
2.0000 g | INTRAVENOUS | Status: DC
  Administered 2018-09-01 – 2018-09-06 (×6): 2 g via INTRAVENOUS
  Filled 2018-09-01 (×7): qty 20

## 2018-09-01 MED ORDER — AMLODIPINE BESYLATE 5 MG PO TABS
5.0000 mg | ORAL_TABLET | Freq: Every day | ORAL | Status: DC
  Administered 2018-09-02 – 2018-09-06 (×5): 5 mg via ORAL
  Filled 2018-09-01 (×5): qty 1

## 2018-09-01 MED ORDER — FUROSEMIDE 40 MG PO TABS: 40.0000 mg | ORAL_TABLET | Freq: Every day | ORAL | Status: DC | PRN

## 2018-09-01 MED ORDER — ALPRAZOLAM 0.25 MG PO TABS
0.2500 mg | ORAL_TABLET | Freq: Every day | ORAL | Status: DC
  Administered 2018-09-02 – 2018-09-06 (×5): 0.25 mg via ORAL
  Filled 2018-09-01 (×5): qty 1

## 2018-09-01 MED ORDER — POTASSIUM CHLORIDE CRYS ER 20 MEQ PO TBCR
20.0000 meq | EXTENDED_RELEASE_TABLET | Freq: Every day | ORAL | Status: DC
  Administered 2018-09-01 – 2018-09-04 (×4): 20 meq via ORAL
  Filled 2018-09-01 (×4): qty 1

## 2018-09-01 MED ORDER — AMLODIPINE-OLMESARTAN 5-20 MG PO TABS: 1.0000 | ORAL_TABLET | Freq: Every day | ORAL | Status: DC

## 2018-09-01 NOTE — ED Notes (Signed)
[employer] called to give report of interrogation.  Pt has a duel chamber pacemaker that is programmed as a single chamber and is operating as it is programmed. Last cleared on July 16th, battery is good, no cardiac episodes.  They will send report.

## 2018-09-01 NOTE — ED Notes (Signed)
IV Team at bedside 

## 2018-09-01 NOTE — ED Notes (Signed)
ED TO INPATIENT HANDOFF REPORT  ED Nurse Name and Phone #: Kathlee Nations [phone]  S Name/Age/Gender [name] 85 y.o. female Room/Bed: 032C/032C  Code Status   Code Status: Prior  Home/SNF/Other Home Patient oriented to: self, place, time and situation Is this baseline? Yes   Triage Complete: Triage complete  Chief Complaint No admission diagnoses are documented for this encounter.  Triage Note Pt seen for UTI yesterday at [facility] and found to have positive blood culture. Instructed by EDP to come to hospital for repeat blood cultures, IV ABX, and admission.    Allergies Allergies  Allergen Reactions  . Zoloft [Sertraline Hcl]     Level of Care/Admitting Diagnosis ED Disposition    ED Disposition Condition [facility] Area: [place] [[id number]]  Level of Care: Telemetry Medical [104]  Covid Evaluation: Asymptomatic Screening Protocol (No Symptoms)  Diagnosis: Bacteremia [790.7.ICD-9-CM]  Admitting Physician: Elwyn Reach [[id number]]  Attending Physician: Elwyn Reach [[id number]]  Estimated length of stay: past midnight tomorrow  Certification:: I certify this patient will need inpatient services for at least 2 midnights  PT Class (Do Not Modify): Inpatient [101]  PT Acc Code (Do Not Modify): Private [1]       B Medical/Surgery History Past Medical History:  Diagnosis Date  . Anxiety   . Arthritis    "mostly my hands" (01/29/2015)  . Basal cell carcinoma of forehead   . Benign essential tremor   . CAP (community acquired pneumonia) 01/29/2015  . CHF (congestive heart failure) ([place])   . Depression   . Edema of both legs 04-23-2013  pt states feet swelling (wears ted hose)   bilateral venous ablation procedures done in IR, L>R  . GERD (gastroesophageal reflux disease)   . H/O hiatal hernia   . Hyperlipidemia   . Hyperparathyroidism ([place])    MILD--  STABLE  . Hypertension   . Kidney stones   . Osteopenia   . Permanent atrial  fibrillation    CARDIOLOGIST-- DR [id number]  . Pneumonia "several times"  . Presence of permanent cardiac pacemaker    LAST PACER CHECK  04-23-2013  IN [facility]  . Right ureteral stone   . S/P mitral valve replacement with bioprosthetic valve    04-03-2009  . Schatzki's ring   . Sick sinus syndrome ([place])    S/P  PACEMAKER  2009  . Thrombocytopenia, immune ([place])    CHRONIC---  HEMOTOLOGIST---  DR ENNEVER  . Venous insufficiency, peripheral   . Wears glasses    Past Surgical History:  Procedure Laterality Date  . CARDIAC CATHETERIZATION  03/27/2009   normal coronaries; grade IV Mitral Insuff.  Marland Kitchen CARDIAC CATHETERIZATION  2001  &  2006  . CARDIAC VALVE REPLACEMENT    . CATARACT EXTRACTION W/ INTRAOCULAR LENS  IMPLANT, BILATERAL Bilateral ~ 2012  . CYSTOSCOPY W/ URETERAL STENT PLACEMENT Left 08/01/2012   Procedure: CYSTOSCOPY WITH RETROGRADE PYELOGRAM/URETERAL STENT PLACEMENT;  Surgeon: Dutch Gray, MD;  Location: [facility];  Service: Urology;  Laterality: Left;  . CYSTOSCOPY WITH URETEROSCOPY Left 08/14/2012   Procedure: LEFT URETEROSCOPY WITH HOLMIUM LASER AND LEFT  STENT PLACEMENT;  Surgeon: Malka So, MD;  Location: [facility];  Service: Urology;  Laterality: Left;  . CYSTOSCOPY WITH URETEROSCOPY AND STENT PLACEMENT Right 04/25/2013   Procedure: RIGHT URETEROSCOPY WITH  STENT PLACEMENT;  Surgeon: Irine Seal, MD;  Location[facility];  Service: Urology;  Laterality: Right;  . ESOPHAGOGASTRODUODENOSCOPY (EGD) WITH  ESOPHAGEAL DILATION  "a couple times" (01/29/2015)  . EXTRACORPOREAL SHOCK WAVE LITHOTRIPSY Left 07-26-2012  . HAND SURGERY Right 05/2011   "no fracture; straightened things up  . HAND SURGERY Left 2014   "no fracture; straightened things up"  . HOLMIUM LASER APPLICATION Left 03/21/2351   Procedure:  HOLMIUM LASER APPLICATION;  Surgeon: Malka So, MD;  Location: [facility];  Service: Urology;  Laterality: Left;  . HOLMIUM LASER APPLICATION Right 07/28/4313   Procedure:  HOLMIUM LASER APPLICATION;  Surgeon: Irine Seal, MD;  Location: [facility];  Service: Urology;  Laterality: Right;  . MITRAL VALVE REPLACEMENT WITH CHORDAL PRESEVATION USING BIOPROSTHETIC VALVE/  LIGATION LEFT ATRIAL APPENDAGE/  LEFT-SIDED MAZE PROCEDURE  04-03-2009  DR Ethel Rana,  SERIAL # [id number]  . NM MYOCAR PERF WALL MOTION  03/01/1999   mild lateral ischemia  . PERMANENT PACEMAKER INSERTION  02/26/2007   Dual Chamber [employer]onic Programmed VVIR--- Generator ADDR01/  #[id number]   . PPM GENERATOR CHANGEOUT N/A 08/20/2018   Procedure: PPM GENERATOR CHANGEOUT;  Surgeon: Sanda Klein, MD;  Location: [facility];  Service: Cardiovascular;  Laterality: N/A;  . TRANSTHORACIC ECHOCARDIOGRAM  09-07-2012  DR CROITORU   MILD LVH/  EF 55-60%/  MILD TO MODERATE AR/   SEVERE LAE  &  RAE/  MILD TO MODERATE TR/   MITRAL VALVE BIOPROSTHESIS LEAFLET NORMAL,  MODERATE STENOSIS WITH NO SIG. REGURG/  MODERATE RVE  . VAGINAL HYSTERECTOMY  1982     A IV Location/Drains/Wounds Patient Lines/Drains/Airways Status   Active Line/Drains/Airways    None          Intake/Output Last 24 hours No intake or output data in the 24 hours ending 09/01/18 1931  Labs/Imaging Results for orders placed or performed during the hospital encounter of 09/01/18 (from the past 48 hour(s))  Lactic acid, plasma     Status: Abnormal   Collection Time: 09/01/18  5:00 PM  Result Value Ref Range   Lactic Acid, Venous 2.1 (HH) 0.5 - 1.9 mmol/L    Comment: CRITICAL RESULT CALLED TO, READ BACK BY AND VERIFIED WITHClaiborne Billings [facility] RN AT 4580 09/01/2018 BY [facility] Performed at [facility], [address] [place], [place] [address]   Comprehensive metabolic panel     Status: Abnormal   Collection Time: 09/01/18  5:00 PM  Result Value Ref Range   Sodium 141 135 - 145 mmol/L   Potassium 4.2 3.5 - 5.1 mmol/L   Chloride 106 98 - 111 mmol/L   CO2 26 22 - 32 mmol/L   Glucose, Bld 117 (H) 70  - 99 mg/dL   BUN 20 8 - 23 mg/dL   Creatinine, Ser 1.44 (H) 0.44 - 1.00 mg/dL   Calcium 11.0 (H) 8.9 - 10.3 mg/dL   Total Protein 6.0 (L) 6.5 - 8.1 g/dL   Albumin 3.8 3.5 - 5.0 g/dL   AST 23 15 - 41 U/L   ALT 13 0 - 44 U/L   Alkaline Phosphatase 51 38 - 126 U/L   Total Bilirubin 0.8 0.3 - 1.2 mg/dL   GFR calc non Af Amer 33 (L) >60 mL/min   GFR calc Af Amer 38 (L) >60 mL/min   Anion gap 9 5 - 15    Comment: Performed at [facility] [address] [place], [place] [address]  CBC with Differential     Status: Abnormal   Collection Time: 09/01/18  5:00 PM  Result Value  Ref Range   WBC 5.7 4.0 - 10.5 K/uL   RBC 4.33 3.87 - 5.11 MIL/uL   Hemoglobin 13.3 12.0 - 15.0 g/dL   HCT 42.4 36.0 - 46.0 %   MCV 97.9 80.0 - 100.0 fL   MCH 30.7 26.0 - 34.0 pg   MCHC 31.4 30.0 - 36.0 g/dL   RDW 13.4 11.5 - 15.5 %   Platelets 108 (L) 150 - 400 K/uL    Comment: REPEATED TO VERIFY PLATELET COUNT CONFIRMED BY SMEAR SPECIMEN CHECKED FOR CLOTS Immature Platelet Fraction may be clinically indicated, consider ordering this additional test [id number]    nRBC 0.0 0.0 - 0.2 %   Neutrophils Relative % 59 %   Neutro Abs 3.3 1.7 - 7.7 K/uL   Lymphocytes Relative 26 %   Lymphs Abs 1.5 0.7 - 4.0 K/uL   Monocytes Relative 10 %   Monocytes Absolute 0.6 0.1 - 1.0 K/uL   Eosinophils Relative 4 %   Eosinophils Absolute 0.2 0.0 - 0.5 K/uL   Basophils Relative 1 %   Basophils Absolute 0.1 0.0 - 0.1 K/uL   Immature Granulocytes 0 %   Abs Immature Granulocytes 0.02 0.00 - 0.07 K/uL    Comment: Performed at [facility], [address] [place], [place] [address]   Dg Chest Portable 1 View  Result Date: 08/31/2018 CLINICAL DATA:  Weakness, confusion EXAM: PORTABLE CHEST 1 VIEW COMPARISON:  03/20/2018 FINDINGS: Left pacer remains in place, unchanged. Cardiomegaly. Lungs clear. No effusions. No acute bony abnormality. IMPRESSION: Cardiomegaly.  No active disease. Electronically Signed   By: Rolm Baptise M.D.   On: 08/31/2018 20:56    Pending Labs Unresulted Labs (From admission, onward)    Start     Ordered   09/01/18 1648  Lactic acid, plasma  Now then every 2 hours,   STAT     09/01/18 1647   09/01/18 1648  Blood culture (routine x 2)  BLOOD CULTURE X 2,   STAT     09/01/18 1647          Vitals/Pain Today's Vitals   09/01/18 1643 09/01/18 1651 09/01/18 1844 09/01/18 1900  BP: 101/66   (!) 114/47  Pulse: 98   80  Resp: 18   18  Temp: (!) 97.4 F (36.3 C)     TempSrc: Oral     SpO2: 97%   96%  PainSc:  0-No pain 0-No pain     Isolation Precautions No active isolations  Medications Medications  sodium chloride flush (NS) 0.9 % injection 3 mL (has no administration in time range)  sodium chloride 0.9 % bolus 500 mL (has no administration in time range)  cefTRIAXone (ROCEPHIN) 1 g in sodium chloride 0.9 % 100 mL IVPB (has no administration in time range)    Mobility walks with device Moderate fall risk   Focused Assessments Pulmonary Assessment Handoff:  Lung sounds:   O2 Device: Room Air      R Recommendations: See Admitting Provider Note  Report given to:   Additional Notes:postivie blood cultures

## 2018-09-01 NOTE — H&P (Signed)
History and Physical   [name] ONG:[id number] DOB: [birth year] DOA: 09/01/2018  Referring MD/NP/PA: Dr. Gilford Raid  PCP: Lawerance Cruel, MD   Outpatient Specialists: None  Patient coming from: Home  Chief Complaint: Positive blood cultures  HPI: [name] is a 85 y.o. female with medical history significant of essential tremors, osteoarthritis, basal cell carcinoma, diastolic CHF, community-acquired pneumonia who was seen in the ER yesterday and diagnosed with UTI.  Patient was discharged home on cephalexin.  She had blood and urine cultures collected.  Blood cultures came back positive for Streptococcus.  Patient was called and asked to come back to the ER today and was admitted for IV antibiotics.  She denied any fever today.  No nausea vomiting or diarrhea.  Patient has history of dual-chamber pacemaker placement..  ED Course: Temperature 97.5 blood pressure 133/58 pulse 94 respiratory rate of 26 oxygen sat 97% room air.  White count is 5.7 hemoglobin 11.9 and platelets 94.  Creatinine is 1.26 BUN 20 and calcium 11.  COVID-19 was negative on July 2.  Patient now being admitted for IV antibiotics.  Review of Systems: As per HPI otherwise 10 point review of systems negative.    Past Medical History:  Diagnosis Date  . Anxiety   . Arthritis    "mostly my hands" (01/29/2015)  . Basal cell carcinoma of forehead   . Benign essential tremor   . CAP (community acquired pneumonia) 01/29/2015  . CHF (congestive heart failure) ([place])   . Depression   . Edema of both legs 04-23-2013  pt states feet swelling (wears ted hose)   bilateral venous ablation procedures done in IR, L>R  . GERD (gastroesophageal reflux disease)   . H/O hiatal hernia   . Hyperlipidemia   . Hyperparathyroidism ([place])    MILD--  STABLE  . Hypertension   . Kidney stones   . Osteopenia   . Permanent atrial fibrillation    CARDIOLOGIST-- DR GMWNUUVO  . Pneumonia "several times"  . Presence of  permanent cardiac pacemaker    LAST PACER CHECK  04-23-2013  IN [facility]  . Right ureteral stone   . S/P mitral valve replacement with bioprosthetic valve    04-03-2009  . Schatzki's ring   . Sick sinus syndrome ([place])    S/P  PACEMAKER  2009  . Thrombocytopenia, immune ([place])    CHRONIC---  HEMOTOLOGIST---  DR ENNEVER  . Venous insufficiency, peripheral   . Wears glasses     Past Surgical History:  Procedure Laterality Date  . CARDIAC CATHETERIZATION  03/27/2009   normal coronaries; grade IV Mitral Insuff.  Marland Kitchen CARDIAC CATHETERIZATION  2001  &  2006  . CARDIAC VALVE REPLACEMENT    . CATARACT EXTRACTION W/ INTRAOCULAR LENS  IMPLANT, BILATERAL Bilateral ~ 2012  . CYSTOSCOPY W/ URETERAL STENT PLACEMENT Left 08/01/2012   Procedure: CYSTOSCOPY WITH RETROGRADE PYELOGRAM/URETERAL STENT PLACEMENT;  Surgeon: Dutch Gray, MD;  Location: [facility];  Service: Urology;  Laterality: Left;  . CYSTOSCOPY WITH URETEROSCOPY Left 08/14/2012   Procedure: LEFT URETEROSCOPY WITH HOLMIUM LASER AND LEFT  STENT PLACEMENT;  Surgeon: Malka So, MD;  Location: [facility];  Service: Urology;  Laterality: Left;  . CYSTOSCOPY WITH URETEROSCOPY AND STENT PLACEMENT Right 04/25/2013   Procedure: RIGHT URETEROSCOPY WITH  STENT PLACEMENT;  Surgeon: Irine Seal, MD;  Location: [facility];  Service: Urology;  Laterality: Right;  . ESOPHAGOGASTRODUODENOSCOPY (EGD) WITH ESOPHAGEAL DILATION  "a couple times" (01/29/2015)  . EXTRACORPOREAL  SHOCK WAVE LITHOTRIPSY Left 07-26-2012  . HAND SURGERY Right 05/2011   "no fracture; straightened things up  . HAND SURGERY Left 2014   "no fracture; straightened things up"  . HOLMIUM LASER APPLICATION Left 06/18/7320   Procedure:  HOLMIUM LASER APPLICATION;  Surgeon: Malka So, MD;  Location: [facility];  Service: Urology;  Laterality: Left;  . HOLMIUM LASER APPLICATION Right 0/25/4270   Procedure: HOLMIUM LASER APPLICATION;  Surgeon: Irine Seal, MD;  Location: [facility];   Service: Urology;  Laterality: Right;  . MITRAL VALVE REPLACEMENT WITH CHORDAL PRESEVATION USING BIOPROSTHETIC VALVE/  LIGATION LEFT ATRIAL APPENDAGE/  LEFT-SIDED MAZE PROCEDURE  04-03-2009  DR Ethel Rana,  SERIAL # [id number]  . NM MYOCAR PERF WALL MOTION  03/01/1999   mild lateral ischemia  . PERMANENT PACEMAKER INSERTION  02/26/2007   Dual Chamber [employer]onic Programmed VVIR--- Generator ADDR01/  #[id number]   . PPM GENERATOR CHANGEOUT N/A 08/20/2018   Procedure: PPM GENERATOR CHANGEOUT;  Surgeon: Sanda Klein, MD;  Location: [facility];  Service: Cardiovascular;  Laterality: N/A;  . TRANSTHORACIC ECHOCARDIOGRAM  09-07-2012  DR CROITORU   MILD LVH/  EF 55-60%/  MILD TO MODERATE AR/   SEVERE LAE  &  RAE/  MILD TO MODERATE TR/   MITRAL VALVE BIOPROSTHESIS LEAFLET NORMAL,  MODERATE STENOSIS WITH NO SIG. REGURG/  MODERATE RVE  . VAGINAL HYSTERECTOMY  1982     reports that she has never smoked. She has never used smokeless tobacco. She reports that she does not drink alcohol or use drugs.  Allergies  Allergen Reactions  . Zoloft [Sertraline Hcl]     Family History  Problem Relation Age of Onset  . Cancer Mother   . Heart attack Father      Prior to Admission medications   Medication Sig Start Date End Date Taking? Authorizing Provider  acetaminophen (TYLENOL) 500 MG tablet Take 500-1,000 mg by mouth daily as needed for moderate pain.   Yes [provider]  ALPRAZolam (XANAX) 0.5 MG tablet TAKE 1 TABLET BY MOUTH AT BEDTIME Patient taking differently: Take 0.25-0.5 mg by mouth See admin instructions. Take 0.25mg  daily and 0.5mg  at night 03/27/15  Yes Carter, Monica, DO  amLODipine-olmesartan (AZOR) 5-20 MG tablet TAKE 1 TABLET BY MOUTH EVERY DAY Patient taking differently: Take 1 tablet by mouth daily.  08/06/18  Yes Croitoru, Mihai, MD  aspirin EC 81 MG tablet Take 81 mg by mouth 3 (three) times a week. Mon, Wed, Fri   Yes [provider]  B Complex  Vitamins (VITAMIN B COMPLEX PO) Take 1 tablet by mouth daily.    Yes [provider]  cephALEXin (KEFLEX) 500 MG capsule Take 1 capsule (500 mg total) by mouth 3 (three) times daily. 08/31/18  Yes Jola Schmidt, MD  cholecalciferol (VITAMIN D3) 25 MCG (1000 UT) tablet Take 1,000 Units by mouth daily.   Yes [provider]  furosemide (LASIX) 40 MG tablet Take 1 tablet (40 mg total) by mouth daily as needed for fluid or edema. Reported on 01/28/2015 02/12/18  Yes Lavina Hamman, MD  KLOR-CON M20 20 MEQ tablet Take 1 tablet (20 mEq total) by mouth daily as needed (take with furosemide or lasix). Patient taking differently: Take 20 mEq by mouth at bedtime.  02/12/18  Yes Lavina Hamman, MD  omeprazole (PRILOSEC) 20 MG capsule Take 20 mg by mouth 2 (two) times a day.    Yes [provider]  OVER THE COUNTER MEDICATION Take 1 tablet by mouth 2 (two) times daily. Nutrifuron (immune supplement)   Yes [provider]  Probiotic CAPS Take 1 capsule by mouth daily.   Yes [provider]  simvastatin (ZOCOR) 10 MG tablet TAKE 1 TABLET BY MOUTH EVERY DAY AT [facility] Patient taking differently: Take 10 mg by mouth at bedtime.  03/30/15  Yes Medina-Vargas, Monina C, NP  Vit B6-Vit B12-Omega 3 Acids (VITAMIN B PLUS+ PO) Take 1 tablet by mouth daily.   Yes [provider]    Physical Exam: Vitals:   09/01/18 1830 09/01/18 1845 09/01/18 1900 09/01/18 1915  BP: 124/61 (!) 109/53 (!) 114/47 (!) 108/52  Pulse: 86 82 80 76  Resp: 19 19 18 16   Temp:      TempSrc:      SpO2: 96% 96% 96% 96%      Constitutional: NAD, Tremulous Vitals:   09/01/18 1830 09/01/18 1845 09/01/18 1900 09/01/18 1915  BP: 124/61 (!) 109/53 (!) 114/47 (!) 108/52  Pulse: 86 82 80 76  Resp: 19 19 18 16   Temp:      TempSrc:      SpO2: 96% 96% 96% 96%   Eyes: PERRL, lids and conjunctivae normal ENMT: Mucous membranes are moist. Posterior pharynx clear of any exudate or  lesions.Normal dentition.  Neck: normal, supple, no masses, no thyromegaly Respiratory: clear to auscultation bilaterally, no wheezing, no crackles. Normal respiratory effort. No accessory muscle use.  Cardiovascular: Regular rate and rhythm, no murmurs / rubs / gallops. No extremity edema. 2+ pedal pulses. No carotid bruits.  Abdomen: no tenderness, no masses palpated. No hepatosplenomegaly. Bowel sounds positive.  Musculoskeletal: no clubbing / cyanosis. No joint deformity upper and lower extremities. Good ROM, no contractures. Normal muscle tone.  Skin: no rashes, lesions, ulcers. No induration Neurologic: CN 2-12 grossly intact. Sensation intact, DTR normal. Strength 5/5 in all 4. Tremors Psychiatric: Normal judgment and insight. Alert and oriented x 3. Normal mood.     Labs on Admission: I have personally reviewed following labs and imaging studies  CBC: Recent Labs  Lab 08/31/18 1955 09/01/18 1700  WBC 7.2 5.7  NEUTROABS  --  3.3  HGB 13.1 13.3  HCT 42.5 42.4  MCV 98.4 97.9  PLT 111* 431*   Basic Metabolic Panel: Recent Labs  Lab 08/31/18 1955 09/01/18 1700  NA 138 141  K 4.1 4.2  CL 101 106  CO2 23 26  GLUCOSE 116* 117*  BUN 24* 20  CREATININE 1.35* 1.44*  CALCIUM 11.3* 11.0*   GFR: Estimated Creatinine Clearance: 25.7 mL/min (A) (by C-G formula based on SCr of 1.44 mg/dL (H)). Liver Function Tests: Recent Labs  Lab 08/31/18 1955 09/01/18 1700  AST 25 23  ALT 15 13  ALKPHOS 54 51  BILITOT 1.0 0.8  PROT 6.6 6.0*  ALBUMIN 4.1 3.8   No results for input(s): LIPASE, AMYLASE in the last 168 hours. No results for input(s): AMMONIA in the last 168 hours. Coagulation Profile: No results for input(s): INR, PROTIME in the last 168 hours. Cardiac Enzymes: No results for input(s): CKTOTAL, CKMB, CKMBINDEX, TROPONINI in the last 168 hours. BNP (last 3 results) No results for input(s): PROBNP in the last 8760 hours. HbA1C: No results for input(s): HGBA1C in the  last 72 hours. CBG: No results for input(s): GLUCAP in the last 168 hours. Lipid Profile: No results for input(s): CHOL, HDL, LDLCALC, TRIG, CHOLHDL, LDLDIRECT in the last 72 hours. Thyroid Function Tests: No  results for input(s): TSH, T4TOTAL, FREET4, T3FREE, THYROIDAB in the last 72 hours. Anemia Panel: No results for input(s): VITAMINB12, FOLATE, FERRITIN, TIBC, IRON, RETICCTPCT in the last 72 hours. Urine analysis:    Component Value Date/Time   COLORURINE YELLOW 08/31/2018 2040   APPEARANCEUR CLOUDY (A) 08/31/2018 2040   LABSPEC 1.015 08/31/2018 2040   PHURINE 6.0 08/31/2018 2040   GLUCOSEU NEGATIVE 08/31/2018 2040   HGBUR SMALL (A) 08/31/2018 2040   BILIRUBINUR NEGATIVE 08/31/2018 2040   KETONESUR NEGATIVE 08/31/2018 2040   PROTEINUR NEGATIVE 08/31/2018 2040   UROBILINOGEN 0.2 04/17/2009 1759   NITRITE NEGATIVE 08/31/2018 2040   LEUKOCYTESUR SMALL (A) 08/31/2018 2040   Sepsis Labs: @LABRCNTIP (procalcitonin:4,lacticidven:4) ) Recent Results (from the past 240 hour(s))  Blood culture (routine x 2)     Status: None (Preliminary result)   Collection Time: 08/31/18  8:30 PM   Specimen: BLOOD  Result Value Ref Range Status   Specimen Description   Final    BLOOD LEFT ANTECUBITAL Performed at [facility], [place] [address] [place], [place] [address]    Special Requests   Final    BOTTLES DRAWN AEROBIC AND ANAEROBIC Blood Culture adequate volume Performed at [facility], [address] [place], [place] [address]    Culture  Setup Time   Final    GRAM POSITIVE COCCI IN CHAINS AEROBIC BOTTLE ONLY CRITICAL RESULT CALLED TO, READ BACK BY AND VERIFIED WITH: RN AT [facility] [id number] AT 3875 BY CM Performed at [facility], [place] [address], [place], [place] [address]    Culture GRAM POSITIVE COCCI IN CHAINS  Final   Report Status PENDING  Incomplete  Blood Culture ID Panel (Reflexed)     Status: Abnormal   Collection Time: 08/31/18  8:30 PM  Result  Value Ref Range Status   Enterococcus species NOT DETECTED NOT DETECTED Final   Listeria monocytogenes NOT DETECTED NOT DETECTED Final   Staphylococcus species NOT DETECTED NOT DETECTED Final   Staphylococcus aureus (BCID) NOT DETECTED NOT DETECTED Final   Streptococcus species DETECTED (A) NOT DETECTED Final    Comment: CRITICAL RESULT CALLED TO, READ BACK BY AND VERIFIED WITH: RN AT [facility] [id number] AT 1453 BY CM    Streptococcus agalactiae DETECTED (A) NOT DETECTED Final    Comment: CRITICAL RESULT CALLED TO, READ BACK BY AND VERIFIED WITH: RN AT [facility] S [facility] [id number] AT 0160 BY CM    Streptococcus pneumoniae NOT DETECTED NOT DETECTED Final   Streptococcus pyogenes NOT DETECTED NOT DETECTED Final   Acinetobacter baumannii NOT DETECTED NOT DETECTED Final   Enterobacteriaceae species NOT DETECTED NOT DETECTED Final   Enterobacter cloacae complex NOT DETECTED NOT DETECTED Final   Escherichia coli NOT DETECTED NOT DETECTED Final   Klebsiella oxytoca NOT DETECTED NOT DETECTED Final   Klebsiella pneumoniae NOT DETECTED NOT DETECTED Final   Proteus species NOT DETECTED NOT DETECTED Final   Serratia marcescens NOT DETECTED NOT DETECTED Final   Haemophilus influenzae NOT DETECTED NOT DETECTED Final   Neisseria meningitidis NOT DETECTED NOT DETECTED Final   Pseudomonas aeruginosa NOT DETECTED NOT DETECTED Final   Candida albicans NOT DETECTED NOT DETECTED Final   Candida glabrata NOT DETECTED NOT DETECTED Final   Candida krusei NOT DETECTED NOT DETECTED Final   Candida parapsilosis NOT DETECTED NOT DETECTED Final   Candida tropicalis NOT DETECTED NOT DETECTED Final    Comment: Performed at [facility], [place] [place],  [place] [address]  Blood culture (routine x 2)     Status: None (Preliminary result)   Collection Time: 08/31/18  8:40 PM   Specimen: BLOOD RIGHT WRIST  Result Value Ref Range Status   Specimen Description   Final    BLOOD RIGHT WRIST Performed at [facility], [address] [place], [place] [address]    Special Requests   Final    BOTTLES DRAWN AEROBIC AND ANAEROBIC Blood Culture adequate volume Performed at [facility], [address] [place], [place] [address]    Culture   Final    NO GROWTH < 24 HOURS Performed at [facility], [place] [address] [place],  [address]    Report Status PENDING  Incomplete     Radiological Exams on Admission: Dg Chest Portable 1 View  Result Date: 08/31/2018 CLINICAL DATA:  Weakness, confusion EXAM: PORTABLE CHEST 1 VIEW COMPARISON:  03/20/2018 FINDINGS: Left pacer remains in place, unchanged. Cardiomegaly. Lungs clear. No effusions. No acute bony abnormality. IMPRESSION: Cardiomegaly.  No active disease. Electronically Signed   By: Rolm Baptise M.D.   On: 08/31/2018 20:56      Assessment/Plan Principal Problem:   Bacteremia Active Problems:   Chronic diastolic congestive heart failure (HCC)   Hyperlipidemia   GERD (gastroesophageal reflux disease)   CKD (chronic kidney disease), stage III ([facility])   UTI (urinary tract infection)   Atrial fibrillation with slow ventricular response ([facility])     #1 bacteremia due to streptococcal agalactiae: This is from [facility] ID panel.  We will initiate IV Rocephin while waiting for full culture sensitivities as well as urine culture results.  #2 UTI: Again wait for culture and sensitivity results and continue IV Rocephin.  #3 hyperlipidemia: Continue with statin.  #4 chronic diastolic heart failure: Continue with treatment  #5 GERD: Continue with PPIs  #6 chronic kidney disease stage III: Appears to be at baseline.  #7 history of atrial fibrillation: Patient appears to be in sinus rhythm at the moment.  #8 essential tremors: Chronic.  Appears to be at baseline.   DVT prophylaxis: Lovenox Code Status: Full code Family Communication: No family around Disposition Plan: To be determined Consults called: None Admission  status: Inpatient  Severity of Illness: The appropriate patient status for this patient is INPATIENT. Inpatient status is judged to be reasonable and necessary in order to provide the required intensity of service to ensure the patient's safety. The patient's presenting symptoms, physical exam findings, and initial radiographic and laboratory data in the context of their chronic comorbidities is felt to place them at high risk for further clinical deterioration. Furthermore, it is not anticipated that the patient will be medically stable for discharge from the hospital within 2 midnights of admission. The following factors support the patient status of inpatient.   " The patient's presenting symptoms include bacteremia. " The worrisome physical exam findings include tremors. " The initial radiographic and laboratory data are worrisome because of positive blood cultures. " The chronic co-morbidities include CHF.   * I certify that at the point of admission it is my clinical judgment that the patient will require inpatient hospital care spanning beyond 2 midnights from the point of admission due to high intensity of service, high risk for further deterioration and high frequency of surveillance required.Barbette Merino MD [facility] Pager [phone]  If 7PM-7AM, please contact night-coverage [email or web address] Password [facility]  09/01/2018, 8:53 PM

## 2018-09-01 NOTE — ED Notes (Signed)
Metronic pacemaker interrogated.

## 2018-09-01 NOTE — ED Provider Notes (Signed)
[facility] EMERGENCY DEPARTMENT Provider Note   CSN: [id number] Arrival date & time: 09/01/18  1637    History   Chief Complaint Chief Complaint  Patient presents with  . Positive Blood Cultures    HPI [name] is a 85 y.o. female.     Pt presents to the ED today with + blood cultures with strep.  She was seen in the ED yesterday for confusion and weakness.  The pt was diagnosed with a UTI.  She was negative for covid.  Pt had not had a fever.  The blood cultures came back + today and the Dr at [facility] spoke with Dr. Tommy Medal (ID).  He recommended admission for IV abx and repeat blood cultures.  Pt took a dose of keflex last night and 1 today for her UTI.  Urine culture done, but is not back yet.  On 7/6, pt did have a dual chamber pacemaker generator changeout.      Past Medical History:  Diagnosis Date  . Anxiety   . Arthritis    "mostly my hands" (01/29/2015)  . Basal cell carcinoma of forehead   . Benign essential tremor   . CAP (community acquired pneumonia) 01/29/2015  . CHF (congestive heart failure) ([place])   . Depression   . Edema of both legs 04-23-2013  pt states feet swelling (wears ted hose)   bilateral venous ablation procedures done in IR, L>R  . GERD (gastroesophageal reflux disease)   . H/O hiatal hernia   . Hyperlipidemia   . Hyperparathyroidism ([place])    MILD--  STABLE  . Hypertension   . Kidney stones   . Osteopenia   . Permanent atrial fibrillation    CARDIOLOGIST-- DR [id number]  . Pneumonia "several times"  . Presence of permanent cardiac pacemaker    LAST PACER CHECK  04-23-2013  IN [facility]  . Right ureteral stone   . S/P mitral valve replacement with bioprosthetic valve    04-03-2009  . Schatzki's ring   . Sick sinus syndrome ([place])    S/P  PACEMAKER  2009  . Thrombocytopenia, immune ([place])    CHRONIC---  HEMOTOLOGIST---  DR ENNEVER  . Venous insufficiency, peripheral   . Wears glasses     Patient Active Problem  List   Diagnosis Date Noted  . Tachycardia-bradycardia syndrome ([place])   . Atrial fibrillation with slow ventricular response ([place])   . Pacemaker battery depletion 08/16/2018  . Fall 02/11/2018  . GERD (gastroesophageal reflux disease) 02/11/2018  . Anxiety 02/11/2018  . CKD (chronic kidney disease), stage III ([place]) 02/11/2018  . UTI (urinary tract infection) 02/11/2018  . Need for prophylactic vaccination and inoculation against influenza 11/19/2015  . Acute encephalopathy 01/31/2015  . Pneumonia, community acquired 01/28/2015  . PNA (pneumonia) 01/28/2015  . Pacemaker 05/06/2014  . Bilateral leg edema 08/20/2013  . S/P mitral valve replacement with bioprosthetic valve - 29 mm Biocor 04/22/2013  . Permanent atrial fibrillation 04/22/2013  . Essential hypertension 04/22/2013  . Hyperlipidemia 04/22/2013  . Hypercalcemia 11/28/2012  . Ureteral stone 08/02/2012  . Acute renal failure ([place]) 08/02/2012  . Thrombocytopenia ([place]) 01/20/2011  . Chronic diastolic congestive heart failure ([place]) 04/04/2007  . BRONCHOPNEUMONIA ORGANISM UNSPECIFIED 03/13/2007    Past Surgical History:  Procedure Laterality Date  . CARDIAC CATHETERIZATION  03/27/2009   normal coronaries; grade IV Mitral Insuff.  Marland Kitchen CARDIAC CATHETERIZATION  2001  &  2006  . CARDIAC VALVE REPLACEMENT    . CATARACT EXTRACTION  W/ INTRAOCULAR LENS  IMPLANT, BILATERAL Bilateral ~ 2012  . CYSTOSCOPY W/ URETERAL STENT PLACEMENT Left 08/01/2012   Procedure: CYSTOSCOPY WITH RETROGRADE PYELOGRAM/URETERAL STENT PLACEMENT;  Surgeon: Dutch Gray, MD;  Location: [facility];  Service: Urology;  Laterality: Left;  . CYSTOSCOPY WITH URETEROSCOPY Left 08/14/2012   Procedure: LEFT URETEROSCOPY WITH HOLMIUM LASER AND LEFT  STENT PLACEMENT;  Surgeon: Malka So, MD;  Location: [facility];  Service: Urology;  Laterality: Left;  . CYSTOSCOPY WITH URETEROSCOPY AND STENT PLACEMENT Right 04/25/2013   Procedure: RIGHT URETEROSCOPY WITH  STENT PLACEMENT;  Surgeon:  Irine Seal, MD;  [facility];  Service: Urology;  Laterality: Right;  . ESOPHAGOGASTRODUODENOSCOPY (EGD) WITH ESOPHAGEAL DILATION  "a couple times" (01/29/2015)  . EXTRACORPOREAL SHOCK WAVE LITHOTRIPSY Left 07-26-2012  . HAND SURGERY Right 05/2011   "no fracture; straightened things up  . HAND SURGERY Left 2014   "no fracture; straightened things up"  . HOLMIUM LASER APPLICATION Left 08/21/6752   Procedure:  HOLMIUM LASER APPLICATION;  Surgeon: Malka So, MD;  Location: [facility];  Service: Urology;  Laterality: Left;  . HOLMIUM LASER APPLICATION Right 4/92/0100   Procedure: HOLMIUM LASER APPLICATION;  Surgeon: Irine Seal, MD;  Location: [facility];  Service: Urology;  Laterality: Right;  . MITRAL VALVE REPLACEMENT WITH CHORDAL PRESEVATION USING BIOPROSTHETIC VALVE/  LIGATION LEFT ATRIAL APPENDAGE/  LEFT-SIDED MAZE PROCEDURE  04-03-2009  DR Ethel Rana,  SERIAL # [id number]  . NM MYOCAR PERF WALL MOTION  03/01/1999   mild lateral ischemia  . PERMANENT PACEMAKER INSERTION  02/26/2007   Dual Chamber Medtronic Programmed VVIR--- Generator ADDR01/  #[id number]   . PPM GENERATOR CHANGEOUT N/A 08/20/2018   Procedure: PPM GENERATOR CHANGEOUT;  Surgeon: Sanda Klein, MD;  Location: [facility];  Service: Cardiovascular;  Laterality: N/A;  . TRANSTHORACIC ECHOCARDIOGRAM  09-07-2012  DR CROITORU   MILD LVH/  EF 55-60%/  MILD TO MODERATE AR/   SEVERE LAE  &  RAE/  MILD TO MODERATE TR/   MITRAL VALVE BIOPROSTHESIS LEAFLET NORMAL,  MODERATE STENOSIS WITH NO SIG. REGURG/  MODERATE RVE  . VAGINAL HYSTERECTOMY  1982     OB History   No obstetric history on file.      Home Medications    Prior to Admission medications   Medication Sig Start Date End Date Taking? Authorizing Provider  acetaminophen (TYLENOL) 500 MG tablet Take 500-1,000 mg by mouth daily as needed for moderate pain.    [provider]  ALPRAZolam (XANAX) 0.5 MG tablet TAKE  1 TABLET BY MOUTH AT BEDTIME Patient taking differently: Take 0.25 mg by mouth 2 (two) times daily.  03/27/15   Gildardo Cranker, DO  amLODipine-olmesartan (AZOR) 5-20 MG tablet TAKE 1 TABLET BY MOUTH EVERY DAY 08/06/18   Croitoru, Mihai, MD  aspirin EC 81 MG tablet Take 81 mg by mouth 3 (three) times a week. Mon, Wed, Fri    [provider]  B Complex Vitamins (VITAMIN B COMPLEX PO) Take 1 tablet by mouth daily.     [provider]  cephALEXin (KEFLEX) 500 MG capsule Take 1 capsule (500 mg total) by mouth 3 (three) times daily. 08/31/18   Jola Schmidt, MD  docusate sodium (COLACE) 100 MG capsule Take 1 capsule (100 mg total) by mouth 2 (two) times daily. 02/04/15   Regalado, Belkys A, MD  furosemide (LASIX) 40 MG tablet Take 1 tablet (40 mg total) by mouth daily as  needed for fluid or edema. Reported on 01/28/2015 02/12/18   Lavina Hamman, MD  KLOR-CON M20 20 MEQ tablet Take 1 tablet (20 mEq total) by mouth daily as needed (take with furosemide or lasix). 02/12/18   Lavina Hamman, MD  Liniments (DEEP BLUE RELIEF EX) Apply 1 application topically 2 (two) times daily as needed (shoulder pain/rotator cuff).    [provider]  Multiple Vitamin (MULTIVITAMIN WITH MINERALS) TABS Take 1 tablet by mouth daily.     [provider]  omeprazole (PRILOSEC) 20 MG capsule Take 20 mg by mouth daily.     [provider]  OVER THE COUNTER MEDICATION Take 1 tablet by mouth 2 (two) times daily. Nutrifuron (immune supplement)    [provider]  Probiotic CAPS Take 1 capsule by mouth daily.    [provider]  simvastatin (ZOCOR) 10 MG tablet TAKE 1 TABLET BY MOUTH EVERY DAY AT [facility] Patient taking differently: Take 10 mg by mouth daily at 6 PM.  03/30/15   Medina-Vargas, Monina C, NP    Family History Family History  Problem Relation Age of Onset  . Cancer Mother   . Heart attack Father     Social History Social History   Tobacco Use  .  Smoking status: Never Smoker  . Smokeless tobacco: Never Used  . Tobacco comment: never used tobacco  Substance Use Topics  . Alcohol use: No    Alcohol/week: 0.0 standard drinks  . Drug use: No     Allergies   Zoloft [sertraline hcl]   Review of Systems Review of Systems  Neurological: Positive for weakness.  All other systems reviewed and are negative.    Physical Exam Updated Vital Signs BP 101/66 (BP Location: Right Arm)   Pulse 98   Temp (!) 97.4 F (36.3 C) (Oral)   Resp 18   SpO2 97%   Physical Exam Vitals signs and nursing note reviewed.  Constitutional:      Appearance: Normal appearance.  HENT:     Head: Normocephalic and atraumatic.     Right Ear: External ear normal.     Left Ear: External ear normal.     Nose: Nose normal.     Mouth/Throat:     Mouth: Mucous membranes are moist.     Pharynx: Oropharynx is clear.  Eyes:     Extraocular Movements: Extraocular movements intact.     Conjunctiva/sclera: Conjunctivae normal.     Pupils: Pupils are equal, round, and reactive to light.  Neck:     Musculoskeletal: Normal range of motion and neck supple.  Cardiovascular:     Rate and Rhythm: Normal rate and regular rhythm.     Pulses: Normal pulses.     Heart sounds: Normal heart sounds.  Pulmonary:     Effort: Pulmonary effort is normal.     Breath sounds: Normal breath sounds.  Abdominal:     General: Abdomen is flat. Bowel sounds are normal.     Palpations: Abdomen is soft.  Musculoskeletal: Normal range of motion.  Skin:    General: Skin is warm and dry.     Capillary Refill: Capillary refill takes less than 2 seconds.  Neurological:     General: No focal deficit present.     Mental Status: She is alert.     Motor: Tremor present.  Psychiatric:        Mood and Affect: Mood normal.        Behavior: Behavior normal.  ED Treatments / Results  Labs (all labs ordered are listed, but only abnormal results are displayed) Labs Reviewed   LACTIC ACID, PLASMA - Abnormal; Notable for the following components:      Result Value   Lactic Acid, Venous 2.1 (*)    All other components within normal limits  COMPREHENSIVE METABOLIC PANEL - Abnormal; Notable for the following components:   Glucose, Bld 117 (*)    Creatinine, Ser 1.44 (*)    Calcium 11.0 (*)    Total Protein 6.0 (*)    GFR calc non Af Amer 33 (*)    GFR calc Af Amer 38 (*)    All other components within normal limits  CBC WITH DIFFERENTIAL/PLATELET - Abnormal; Notable for the following components:   Platelets 108 (*)    All other components within normal limits  CULTURE, BLOOD (ROUTINE X 2)  CULTURE, BLOOD (ROUTINE X 2)  LACTIC ACID, PLASMA    EKG None  Radiology Dg Chest Portable 1 View  Result Date: 08/31/2018 CLINICAL DATA:  Weakness, confusion EXAM: PORTABLE CHEST 1 VIEW COMPARISON:  03/20/2018 FINDINGS: Left pacer remains in place, unchanged. Cardiomegaly. Lungs clear. No effusions. No acute bony abnormality. IMPRESSION: Cardiomegaly.  No active disease. Electronically Signed   By: Rolm Baptise M.D.   On: 08/31/2018 20:56    Procedures Procedures (including critical care time)  Medications Ordered in ED Medications  sodium chloride flush (NS) 0.9 % injection 3 mL (has no administration in time range)  sodium chloride 0.9 % bolus 500 mL (has no administration in time range)  cefTRIAXone (ROCEPHIN) 1 g in sodium chloride 0.9 % 100 mL IVPB (has no administration in time range)     Initial Impression / Assessment and Plan / ED Course  I have reviewed the triage vital signs and the nursing notes.  Pertinent labs & imaging results that were available during my care of the patient were reviewed by me and considered in my medical decision making (see chart for details).       Pt started on Rocephin.  Blood cultures were redrawn.  She was d/w Dr. Jonelle Sidle ([facility]) who will admit.  covid negative yesterday.  [name] was evaluated in Emergency  Department on 09/01/2018 for the symptoms described in the history of present illness. She was evaluated in the context of the global COVID-19 pandemic, which necessitated consideration that the patient might be at risk for infection with the SARS-CoV-2 virus that causes COVID-19. Institutional protocols and algorithms that pertain to the evaluation of patients at risk for COVID-19 are in a state of rapid change based on information released by regulatory bodies including the CDC and federal and state organizations. These policies and algorithms were followed during the patient's care in the ED.  Final Clinical Impressions(s) / ED Diagnoses   Final diagnoses:  Bacteremia  Acute cystitis without hematuria    ED Discharge Orders    None       Isla Pence, MD 09/02/18 2257

## 2018-09-01 NOTE — ED Provider Notes (Signed)
Received positive blood cx results from blood taken last night 1/2 sets with Group B Strep.  Discussed with Dr. Tommy Medal of ID.  Will have patient return to the ED for IV antibiotics, repeat blood cultures and admission to the hospital.  Recent pacemaker placement at [facility] and presented with mild confusion yesterday, UA concerning for infection and placed on keflex, urine cx pending at this time.  Discussed with son on phone who is calling sister and will bring patient back to the ED.  Reports he will go to [facility] or return to [facility].    Gareth Morgan, MD 09/01/18 1531

## 2018-09-01 NOTE — ED Triage Notes (Signed)
Pt seen for UTI yesterday at [facility] and found to have positive blood culture. Instructed by EDP to come to hospital for repeat blood cultures, IV ABX, and admission.

## 2018-09-02 DIAGNOSIS — N183 Chronic kidney disease, stage 3 (moderate): Secondary | ICD-10-CM

## 2018-09-02 DIAGNOSIS — N3000 Acute cystitis without hematuria: Principal | ICD-10-CM

## 2018-09-02 DIAGNOSIS — K219 Gastro-esophageal reflux disease without esophagitis: Secondary | ICD-10-CM

## 2018-09-02 LAB — CREATININE, SERUM
Creatinine, Ser: 1.26 mg/dL — ABNORMAL HIGH (ref 0.44–1.00)
GFR calc Af Amer: 44 mL/min — ABNORMAL LOW (ref >60)
GFR calc non Af Amer: 38 mL/min — ABNORMAL LOW (ref >60)

## 2018-09-02 LAB — CBC
HCT: 37.5 % (ref 36.0–46.0)
HCT: 37.9 % (ref 36.0–46.0)
Hemoglobin: 11.9 g/dL — ABNORMAL LOW (ref 12.0–15.0)
Hemoglobin: 12.1 g/dL (ref 12.0–15.0)
MCH: 30.8 pg (ref 26.0–34.0)
MCH: 31.1 pg (ref 26.0–34.0)
MCHC: 31.7 g/dL (ref 30.0–36.0)
MCHC: 31.9 g/dL (ref 30.0–36.0)
MCV: 97.2 fL (ref 80.0–100.0)
MCV: 97.4 fL (ref 80.0–100.0)
Platelets: 94 10*3/uL — ABNORMAL LOW (ref 150–400)
Platelets: 94 10*3/uL — ABNORMAL LOW (ref 150–400)
RBC: 3.86 MIL/uL — ABNORMAL LOW (ref 3.87–5.11)
RBC: 3.89 MIL/uL (ref 3.87–5.11)
RDW: 13.4 % (ref 11.5–15.5)
RDW: 13.4 % (ref 11.5–15.5)
WBC: 5.3 10*3/uL (ref 4.0–10.5)
WBC: 5.7 10*3/uL (ref 4.0–10.5)
nRBC: 0 % (ref 0.0–0.2)
nRBC: 0 % (ref 0.0–0.2)

## 2018-09-02 LAB — COMPREHENSIVE METABOLIC PANEL
ALT: 11 U/L (ref 0–44)
AST: 20 U/L (ref 15–41)
Albumin: 3.1 g/dL — ABNORMAL LOW (ref 3.5–5.0)
Alkaline Phosphatase: 41 U/L (ref 38–126)
Anion gap: 8 (ref 5–15)
BUN: 19 mg/dL (ref 8–23)
CO2: 27 mmol/L (ref 22–32)
Calcium: 10.4 mg/dL — ABNORMAL HIGH (ref 8.9–10.3)
Chloride: 106 mmol/L (ref 98–111)
Creatinine, Ser: 1.17 mg/dL — ABNORMAL HIGH (ref 0.44–1.00)
GFR calc Af Amer: 49 mL/min — ABNORMAL LOW (ref >60)
GFR calc non Af Amer: 42 mL/min — ABNORMAL LOW (ref >60)
Glucose, Bld: 89 mg/dL (ref 70–99)
Potassium: 4.7 mmol/L (ref 3.5–5.1)
Sodium: 141 mmol/L (ref 135–145)
Total Bilirubin: 0.6 mg/dL (ref 0.3–1.2)
Total Protein: 5.1 g/dL — ABNORMAL LOW (ref 6.5–8.1)

## 2018-09-02 LAB — LACTIC ACID, PLASMA: Lactic Acid, Venous: 1.4 mmol/L (ref 0.5–1.9)

## 2018-09-02 NOTE — Progress Notes (Addendum)
I discussed / reviewed the pharmacy note by Dr. Maryan Rued, and I agree with the resident's findings and plans as documented. Brendolyn Patty, PharmD     PHARMACY - PHYSICIAN COMMUNICATION CRITICAL VALUE ALERT - BLOOD CULTURE IDENTIFICATION (BCID)  Results for orders placed or performed during the hospital encounter of 08/31/18  Blood Culture ID Panel (Reflexed) (Collected: 08/31/2018  8:30 PM)  Result Value Ref Range   Enterococcus species NOT DETECTED NOT DETECTED   Listeria monocytogenes NOT DETECTED NOT DETECTED   Staphylococcus species NOT DETECTED NOT DETECTED   Staphylococcus aureus (BCID) NOT DETECTED NOT DETECTED   Streptococcus species DETECTED (A) NOT DETECTED   Streptococcus agalactiae DETECTED (A) NOT DETECTED   Streptococcus pneumoniae NOT DETECTED NOT DETECTED   Streptococcus pyogenes NOT DETECTED NOT DETECTED   Acinetobacter baumannii NOT DETECTED NOT DETECTED   Enterobacteriaceae species NOT DETECTED NOT DETECTED   Enterobacter cloacae complex NOT DETECTED NOT DETECTED   Escherichia coli NOT DETECTED NOT DETECTED   Klebsiella oxytoca NOT DETECTED NOT DETECTED   Klebsiella pneumoniae NOT DETECTED NOT DETECTED   Proteus species NOT DETECTED NOT DETECTED   Serratia marcescens NOT DETECTED NOT DETECTED   Haemophilus influenzae NOT DETECTED NOT DETECTED   Neisseria meningitidis NOT DETECTED NOT DETECTED   Pseudomonas aeruginosa NOT DETECTED NOT DETECTED   Candida albicans NOT DETECTED NOT DETECTED   Candida glabrata NOT DETECTED NOT DETECTED   Candida krusei NOT DETECTED NOT DETECTED   Candida parapsilosis NOT DETECTED NOT DETECTED   Candida tropicalis NOT DETECTED NOT DETECTED    Name of physician (or Provider) Contacted: Flora Lipps, MD  Changes to prescribed antibiotics required:  Pt is currently on ceftriaxone 2 gm IV q24h, which provides adequate coverage for GBS bacteremia. Spoke to MD about possible narrowing to penicillin G, however MD has not seen pt  yet. MD will round on pt later and reassess narrowing coverage. Continue ceftriaxone for now.  Berenice Bouton, PharmD PGY1 Pharmacy Resident Office phone: [phone]  09/02/2018  8:23 AM

## 2018-09-02 NOTE — Progress Notes (Signed)
New Admission Note:  Arrival Method: By bed from ED around 2120 Mental Orientation: Alert and oriented x 4 Telemetry: CCMD notified, 2I29 Assessment: Completed Skin: Completed, refer to flowsheets IV: Left forearm S.L. Pain: Denies Tubes: None Safety Measures: Safety Fall Prevention Plan was given, discussed  Admission: Completed [facility] Orientation: Patient has been orientated to the room, unit and the staff. Family: None  Orders have been reviewed and implemented. Will continue to monitor the patient. Call light has been placed within reach and bed alarm has been activated. Pt also put on a low bed.   Perry Mount, RN  Phone Number: [phone]

## 2018-09-02 NOTE — Progress Notes (Addendum)
PROGRESS NOTE  [name] GQQ:[id number] DOB: [birth year] DOA: 09/01/2018 PCP: Lawerance Cruel, MD   LOS: 1 day   Brief narrative: [name] is a 85 y.o. female with medical history significant of essential tremors, osteoarthritis, basal cell carcinoma, diastolic CHF, community-acquired pneumonia who was seen in the ER  and diagnosed with UTI.  Patient was discharged home on cephalexin.  She had blood and urine cultures collected.  Blood cultures came back positive for Streptococcus.  Patient was called and asked to come back to the ER  and was admitted for IV antibiotics.  She denied any fever on presentation.  Patient has history of dual-chamber pacemaker placement.. In the ED, Temperature 97.5 blood pressure 133/58 pulse 94 respiratory rate of 26 oxygen sat 97% room air.  White count is 5.7 hemoglobin 11.9 and platelets 94.  Creatinine is 1.26 BUN 20 and calcium 11.  COVID-19 was negative on July 2.  Patient now being admitted for IV antibiotics.  Assessment/Plan:  Principal Problem:   Bacteremia Active Problems:   Chronic diastolic congestive heart failure (HCC)   Hypercalcemia   Permanent atrial fibrillation   Hyperlipidemia   Pacemaker   GERD (gastroesophageal reflux disease)   CKD (chronic kidney disease), stage III (HCC)   UTI (urinary tract infection)   Atrial fibrillation with slow ventricular response ([place])  Bacteremia due to streptococcal agalactiae: on rocephin. will continue for now.  Follow blood culture sent from yesterday.  Patient is afebrile at this time.  No leukocytosis.  Chest x-ray was negative for acute infection.  UTI:  Follow urine cultures.  Currently on Rocephin.  hyperlipidemia: Continue with statin.  Essential hypertension.  Continue antihypertensive medication.  chronic diastolic heart failure: Continue with treatment  GERD: Continue with PPIs  Chronic kidney disease stage III: Appears to be at baseline.  History of atrial  fibrillation:  Currently in sinus rhythm   Esential tremors: Chronic.  Appears to be at baseline.   VTE Prophylaxis: Lovenox  Code Status: Full code  Family Communication: spoke with the patient's daughter and updated her about the clinical condition of the patient.  Disposition Plan: Home likely in 1 to 2 days.  Will follow blood cultures.   Consultants:  None  Procedures:  None  Antibiotics:  Rocephin IV 7/18>  Anti-infectives (From admission, onward)   Start     Dose/Rate Route Frequency Ordered Stop   09/01/18 2100  cefTRIAXone (ROCEPHIN) 2 g in sodium chloride 0.9 % 100 mL IVPB     2 g 200 mL/hr over 30 Minutes Intravenous Every 24 hours 09/01/18 2055     09/01/18 1900  cefTRIAXone (ROCEPHIN) 1 g in sodium chloride 0.9 % 100 mL IVPB  Status:  Discontinued     1 g 200 mL/hr over 30 Minutes Intravenous  Once 09/01/18 1845 09/01/18 2101     Subjective: Patient denies any fever, chills or rigor.  Denies shortness of breath, chest pain, fever  Objective: Vitals:   09/01/18 2109 09/02/18 0428  BP: (!) 117/54 (!) 101/51  Pulse: 76 61  Resp: 18 18  Temp: (!) 97.4 F (36.3 C) 97.9 F (36.6 C)  SpO2: 100% 94%    Intake/Output Summary (Last 24 hours) at 09/02/2018 1036 Last data filed at 09/02/2018 0920 Gross per 24 hour  Intake 861.12 ml  Output 400 ml  Net 461.12 ml   There were no vitals filed for this visit. There is no height or weight on file to calculate BMI.  Physical Exam: GENERAL: Patient is alert awake and oriented. Not in obvious distress.  Thinly built.  On room air currently. HENT: No scleral pallor or icterus. Pupils equally reactive to light. Oral mucosa is moist NECK: is supple, no palpable thyroid enlargement. CHEST: Diminished breath sounds bilaterally. CVS: S1 and S2 heard, no murmur. Regular rate and rhythm. No pericardial rub. ABDOMEN: Soft, non-tender, bowel sounds are present. No palpable hepato-splenomegaly. EXTREMITIES: No  edema.  Tremors noted. CNS: Cranial nerves are intact. No focal motor or sensory deficits. SKIN: warm and dry without rashes.  Data Review: I have personally reviewed the following laboratory data and studies,  CBC: Recent Labs  Lab 08/31/18 1955 09/01/18 1700 09/01/18 2335 09/02/18 0400  WBC 7.2 5.7 5.7 5.3  NEUTROABS  --  3.3  --   --   HGB 13.1 13.3 11.9* 12.1  HCT 42.5 42.4 37.5 37.9  MCV 98.4 97.9 97.2 97.4  PLT 111* 108* 94* 94*   Basic Metabolic Panel: Recent Labs  Lab 08/31/18 1955 09/01/18 1700 09/01/18 2335 09/02/18 0400  NA 138 141  --  141  K 4.1 4.2  --  4.7  CL 101 106  --  106  CO2 23 26  --  27  GLUCOSE 116* 117*  --  89  BUN 24* 20  --  19  CREATININE 1.35* 1.44* 1.26* 1.17*  CALCIUM 11.3* 11.0*  --  10.4*   Liver Function Tests: Recent Labs  Lab 08/31/18 1955 09/01/18 1700 09/02/18 0400  AST 25 23 20   ALT 15 13 11   ALKPHOS 54 51 41  BILITOT 1.0 0.8 0.6  PROT 6.6 6.0* 5.1*  ALBUMIN 4.1 3.8 3.1*   No results for input(s): LIPASE, AMYLASE in the last 168 hours. No results for input(s): AMMONIA in the last 168 hours. Cardiac Enzymes: No results for input(s): CKTOTAL, CKMB, CKMBINDEX, TROPONINI in the last 168 hours. BNP (last 3 results) Recent Labs    02/11/18 0340  BNP 79.2    ProBNP (last 3 results) No results for input(s): PROBNP in the last 8760 hours.  CBG: No results for input(s): GLUCAP in the last 168 hours. Recent Results (from the past 240 hour(s))  Blood culture (routine x 2)     Status: Abnormal (Preliminary result)   Collection Time: 08/31/18  8:30 PM   Specimen: BLOOD  Result Value Ref Range Status   Specimen Description   Final    BLOOD LEFT ANTECUBITAL Performed at [facility], [place] [address] [place], [place] [address]    Special Requests   Final    BOTTLES DRAWN AEROBIC AND ANAEROBIC Blood Culture adequate volume Performed at [facility], [address] [place], [place] [address]     Culture  Setup Time   Final    GRAM POSITIVE COCCI IN CHAINS IN BOTH AEROBIC AND ANAEROBIC BOTTLES CRITICAL RESULT CALLED TO, READ BACK BY AND VERIFIED WITH: RN AT [facility] S GOUGE [id number] AT 1453 BY CM GRAM POSITIVE RODS CRITICAL RESULT CALLED TO, READ BACK BY AND VERIFIED WITH: PHARMD Y STEENWYK [id number] AT 800 AM BY CM    Culture (A)  Final    GROUP B STREP(S.AGALACTIAE)ISOLATED SUSCEPTIBILITIES TO FOLLOW Performed at [facility], [place] [address], [place], [place] [address]    Report Status PENDING  Incomplete  Blood Culture ID Panel (Reflexed)     Status: Abnormal   Collection Time: 08/31/18  8:30 PM  Result Value Ref Range Status  Enterococcus species NOT DETECTED NOT DETECTED Final   Listeria monocytogenes NOT DETECTED NOT DETECTED Final   Staphylococcus species NOT DETECTED NOT DETECTED Final   Staphylococcus aureus (BCID) NOT DETECTED NOT DETECTED Final   Streptococcus species DETECTED (A) NOT DETECTED Final    Comment: CRITICAL RESULT CALLED TO, READ BACK BY AND VERIFIED WITH: RN AT [facility] D GOUGE [id number] AT 1453 BY CM    Streptococcus agalactiae DETECTED (A) NOT DETECTED Final    Comment: CRITICAL RESULT CALLED TO, READ BACK BY AND VERIFIED WITH: RN AT [facility] S GOUGE [phone] AT 9030 BY CM    Streptococcus pneumoniae NOT DETECTED NOT DETECTED Final   Streptococcus pyogenes NOT DETECTED NOT DETECTED Final   Acinetobacter baumannii NOT DETECTED NOT DETECTED Final   Enterobacteriaceae species NOT DETECTED NOT DETECTED Final   Enterobacter cloacae complex NOT DETECTED NOT DETECTED Final   Escherichia coli NOT DETECTED NOT DETECTED Final   Klebsiella oxytoca NOT DETECTED NOT DETECTED Final   Klebsiella pneumoniae NOT DETECTED NOT DETECTED Final   Proteus species NOT DETECTED NOT DETECTED Final   Serratia marcescens NOT DETECTED NOT DETECTED Final   Haemophilus influenzae NOT DETECTED NOT DETECTED Final   Neisseria meningitidis NOT DETECTED NOT DETECTED Final   Pseudomonas  aeruginosa NOT DETECTED NOT DETECTED Final   Candida albicans NOT DETECTED NOT DETECTED Final   Candida glabrata NOT DETECTED NOT DETECTED Final   Candida krusei NOT DETECTED NOT DETECTED Final   Candida parapsilosis NOT DETECTED NOT DETECTED Final   Candida tropicalis NOT DETECTED NOT DETECTED Final    Comment: Performed at [facility], [address], [place], [place] [address]  Blood culture (routine x 2)     Status: None (Preliminary result)   Collection Time: 08/31/18  8:40 PM   Specimen: BLOOD RIGHT WRIST  Result Value Ref Range Status   Specimen Description   Final    BLOOD RIGHT WRIST Performed at [facility], [address] [place], [place] [address]    Special Requests   Final    BOTTLES DRAWN AEROBIC AND ANAEROBIC Blood Culture adequate volume Performed at [facility], [address] [place], [place] [address]    Culture   Final    NO GROWTH 2 DAYS Performed at [facility], [place] [address], [place], [place] [address]    Report Status PENDING  Incomplete  Blood culture (routine x 2)     Status: None (Preliminary result)   Collection Time: 09/01/18  2:53 PM   Specimen: BLOOD  Result Value Ref Range Status   Specimen Description BLOOD LEFT ANTECUBITAL  Final   Special Requests   Final    BOTTLES DRAWN AEROBIC AND ANAEROBIC Blood Culture results may not be optimal due to an inadequate volume of blood received in culture bottles   Culture   Final    NO GROWTH < 24 HOURS Performed at [facility], [place] [address] [place], [place] [address]    Report Status PENDING  Incomplete  Blood culture (routine x 2)     Status: None (Preliminary result)   Collection Time: 09/01/18  2:58 PM   Specimen: BLOOD LEFT FOREARM  Result Value Ref Range Status   Specimen Description BLOOD LEFT FOREARM  Final   Special Requests   Final    BOTTLES DRAWN AEROBIC AND ANAEROBIC Blood Culture adequate volume   Culture   Final    NO GROWTH < 24 HOURS  Performed at [facility], [place] [address], [place], [place] [address]    Report Status PENDING  Incomplete     Studies: Dg Chest Portable 1 View  Result Date: 08/31/2018 CLINICAL DATA:  Weakness, confusion EXAM: PORTABLE CHEST 1 VIEW COMPARISON:  03/20/2018 FINDINGS: Left pacer remains in place, unchanged. Cardiomegaly. Lungs clear. No effusions. No acute bony abnormality. IMPRESSION: Cardiomegaly.  No active disease. Electronically Signed   By: Rolm Baptise M.D.   On: 08/31/2018 20:56    Scheduled Meds: . acidophilus  1 capsule Oral Daily  . ALPRAZolam  0.25 mg Oral Daily   And  . ALPRAZolam  0.5 mg Oral QHS  . amLODipine  5 mg Oral Daily   And  . irbesartan  150 mg Oral Daily  . [START ON 09/03/2018] aspirin EC  81 mg Oral Q M,W,F  . B-complex with vitamin C  1 tablet Oral Daily  . cholecalciferol  1,000 Units Oral Daily  . enoxaparin (LOVENOX) injection  30 mg Subcutaneous Q24H  . pantoprazole  40 mg Oral Daily  . potassium chloride SA  20 mEq Oral QHS  . simvastatin  10 mg Oral QHS  . sodium chloride flush  3 mL Intravenous Once    Continuous Infusions: . cefTRIAXone (ROCEPHIN)  IV 2 g (09/01/18 2245)     Flora Lipps, MD  [facility] 09/02/2018

## 2018-09-03 ENCOUNTER — Ambulatory Visit: Payer: Medicare Other | Admitting: Hematology & Oncology

## 2018-09-03 ENCOUNTER — Inpatient Hospital Stay: Payer: Medicare Other | Attending: Hematology & Oncology

## 2018-09-03 DIAGNOSIS — N390 Urinary tract infection, site not specified: Secondary | ICD-10-CM

## 2018-09-03 DIAGNOSIS — B9620 Unspecified Escherichia coli [E. coli] as the cause of diseases classified elsewhere: Secondary | ICD-10-CM

## 2018-09-03 DIAGNOSIS — M1990 Unspecified osteoarthritis, unspecified site: Secondary | ICD-10-CM

## 2018-09-03 DIAGNOSIS — Z952 Presence of prosthetic heart valve: Secondary | ICD-10-CM

## 2018-09-03 DIAGNOSIS — Z8701 Personal history of pneumonia (recurrent): Secondary | ICD-10-CM

## 2018-09-03 DIAGNOSIS — Z950 Presence of cardiac pacemaker: Secondary | ICD-10-CM

## 2018-09-03 DIAGNOSIS — R7881 Bacteremia: Secondary | ICD-10-CM

## 2018-09-03 DIAGNOSIS — I5030 Unspecified diastolic (congestive) heart failure: Secondary | ICD-10-CM

## 2018-09-03 DIAGNOSIS — B951 Streptococcus, group B, as the cause of diseases classified elsewhere: Secondary | ICD-10-CM

## 2018-09-03 DIAGNOSIS — Z888 Allergy status to other drugs, medicaments and biological substances status: Secondary | ICD-10-CM

## 2018-09-03 DIAGNOSIS — Z95828 Presence of other vascular implants and grafts: Secondary | ICD-10-CM

## 2018-09-03 LAB — CBC
HCT: 37.9 % (ref 36.0–46.0)
Hemoglobin: 12.1 g/dL (ref 12.0–15.0)
MCH: 30.6 pg (ref 26.0–34.0)
MCHC: 31.9 g/dL (ref 30.0–36.0)
MCV: 95.7 fL (ref 80.0–100.0)
Platelets: 91 10*3/uL — ABNORMAL LOW (ref 150–400)
RBC: 3.96 MIL/uL (ref 3.87–5.11)
RDW: 13.3 % (ref 11.5–15.5)
WBC: 5.2 10*3/uL (ref 4.0–10.5)
nRBC: 0 % (ref 0.0–0.2)

## 2018-09-03 LAB — BASIC METABOLIC PANEL
Anion gap: 7 (ref 5–15)
BUN: 20 mg/dL (ref 8–23)
CO2: 26 mmol/L (ref 22–32)
Calcium: 10.3 mg/dL (ref 8.9–10.3)
Chloride: 106 mmol/L (ref 98–111)
Creatinine, Ser: 1.14 mg/dL — ABNORMAL HIGH (ref 0.44–1.00)
GFR calc Af Amer: 50 mL/min — ABNORMAL LOW (ref >60)
GFR calc non Af Amer: 43 mL/min — ABNORMAL LOW (ref >60)
Glucose, Bld: 88 mg/dL (ref 70–99)
Potassium: 5 mmol/L (ref 3.5–5.1)
Sodium: 139 mmol/L (ref 135–145)

## 2018-09-03 LAB — SARS CORONAVIRUS 2 BY RT PCR (HOSPITAL ORDER, PERFORMED IN ~~LOC~~ HOSPITAL LAB): SARS Coronavirus 2: NEGATIVE

## 2018-09-03 LAB — URINE CULTURE: Culture: >=100000 — AB

## 2018-09-03 NOTE — Progress Notes (Signed)
PROGRESS NOTE  [name] EPP:[id number] DOB: September 01, 1929 DOA: 09/01/2018 PCP: Lawerance Cruel, MD   LOS: 2 days   Brief narrative: [name] is a 85 y.o. female with medical history significant of essential tremors, osteoarthritis, basal cell carcinoma, diastolic CHF, community-acquired pneumonia who was seen in the ER  and diagnosed with UTI.  Patient was discharged home on cephalexin.  She had blood and urine cultures collected.  Blood cultures came back positive for Streptococcus.  Patient was called and asked to come back to the ER  and was admitted for IV antibiotics.  She denied any fever on presentation.  Patient has history of dual-chamber pacemaker placement.. In the ED, Temperature 97.5 blood pressure 133/58 pulse 94 respiratory rate of 26 oxygen sat 97% room air.  White count is 5.7 hemoglobin 11.9 and platelets 94.  Creatinine is 1.26 BUN 20 and calcium 11.  COVID-19 was negative on July 2.  Patient was then admitted for IV antibiotics.  Assessment/Plan:  Principal Problem:   Bacteremia Active Problems:   Chronic diastolic congestive heart failure (HCC)   Hypercalcemia   Permanent atrial fibrillation   Hyperlipidemia   Pacemaker   GERD (gastroesophageal reflux disease)   CKD (chronic kidney disease), stage III (HCC)   UTI (urinary tract infection)   Atrial fibrillation with slow ventricular response ([place])  Bacteremia due to streptococcal agalactia from blood cultures done on 08/31/2018. on rocephin IV. will continue for now.  Blood cultures from 09/01/2018 are negative so far.  Patient is afebrile at this time.  No leukocytosis.  Chest x-ray was negative for acute infection.  Due to presence of pacemaker, I spoke with infectious disease doctor.  At this time, will consider for TEE.  ID will see the patient today.  E. coli UTI:  Urine Culture from 08/31/2018 reviewed.  E. coli resistant to Cipro but sensitive to other antibiotics.  Continue Rocephin for now.   hyperlipidemia: Continue with statin.  Essential hypertension.  Continue Lasix for now.  Blood pressure is stable.  chronic diastolic heart failure, history of pacemaker placement.: Continue with current treatment, compensated.  Consider for TEE.  GERD: Continue with PPIs  Chronic kidney disease stage III: Appears to be at baseline.  Monitor BMP periodically   History of atrial fibrillation:  Currently in sinus rhythm.  Not on anticoagulation.  Esential tremors: Chronic.  Appears to be at baseline.   VTE Prophylaxis: Lovenox  Code Status: Full code  Family Communication: I again spoke with the patient's daughter on the phone today and updated her about the clinical condition of the patient and the possible need for TEE.  Disposition Plan: Home likely in 2 to 3 days.  Will follow blood cultures.  Request for TEE.   Consultants:  Infectious disease Dr. Linus Salmons  Procedures:  None  Antibiotics:  Rocephin IV 7/18>  Anti-infectives (From admission, onward)   Start     Dose/Rate Route Frequency Ordered Stop   09/01/18 2100  cefTRIAXone (ROCEPHIN) 2 g in sodium chloride 0.9 % 100 mL IVPB     2 g 200 mL/hr over 30 Minutes Intravenous Every 24 hours 09/01/18 2055     09/01/18 1900  cefTRIAXone (ROCEPHIN) 1 g in sodium chloride 0.9 % 100 mL IVPB  Status:  Discontinued     1 g 200 mL/hr over 30 Minutes Intravenous  Once 09/01/18 1845 09/01/18 2101     Subjective: Patient denies interval complaints.  Denies shortness of breath, chest pain, fever,  chills or rigor.  Objective: Vitals:   09/02/18 2136 09/03/18 0539  BP: (!) 111/51 (!) 114/47  Pulse: 85 68  Resp: 18 18  Temp: 97.8 F (36.6 C) 98 F (36.7 C)  SpO2: 96% 97%    Intake/Output Summary (Last 24 hours) at 09/03/2018 1023 Last data filed at 09/03/2018 0500 Gross per 24 hour  Intake 340 ml  Output 1050 ml  Net -710 ml   There were no vitals filed for this visit. There is no height or weight on file to  calculate BMI.   Physical Exam: General:  Average built, not in obvious distress.  thinly built.  On room air. HENT: Normocephalic, pupils equally reacting to light and accommodation.  No scleral pallor or icterus noted. Oral mucosa is moist.  Chest:    Diminished breath sounds bilaterally.  Pacemaker in place without local signs of infection. CVS: S1 &S2 heard. No murmur.  Regular rate and rhythm. Abdomen: Soft, nontender, nondistended.  Bowel sounds are heard.  Liver is not palpable, no abdominal mass palpated Extremities: No cyanosis, clubbing or edema.  Peripheral pulses are palpable. Psych: Alert, awake and oriented, normal mood CNS:  No cranial nerve deficits.  Power equal in all extremities.  No sensory deficits noted.  No cerebellar signs.    Skin: Warm and dry.  No rashes noted.   Data Review: I have personally reviewed the following laboratory data and studies,  CBC: Recent Labs  Lab 08/31/18 1955 09/01/18 1700 09/01/18 2335 09/02/18 0400 09/03/18 0317  WBC 7.2 5.7 5.7 5.3 5.2  NEUTROABS  --  3.3  --   --   --   HGB 13.1 13.3 11.9* 12.1 12.1  HCT 42.5 42.4 37.5 37.9 37.9  MCV 98.4 97.9 97.2 97.4 95.7  PLT 111* 108* 94* 94* 91*   Basic Metabolic Panel: Recent Labs  Lab 08/31/18 1955 09/01/18 1700 09/01/18 2335 09/02/18 0400 09/03/18 0317  NA 138 141  --  141 139  K 4.1 4.2  --  4.7 5.0  CL 101 106  --  106 106  CO2 23 26  --  27 26  GLUCOSE 116* 117*  --  89 88  BUN 24* 20  --  19 20  CREATININE 1.35* 1.44* 1.26* 1.17* 1.14*  CALCIUM 11.3* 11.0*  --  10.4* 10.3   Liver Function Tests: Recent Labs  Lab 08/31/18 1955 09/01/18 1700 09/02/18 0400  AST 25 23 20   ALT 15 13 11   ALKPHOS 54 51 41  BILITOT 1.0 0.8 0.6  PROT 6.6 6.0* 5.1*  ALBUMIN 4.1 3.8 3.1*   No results for input(s): LIPASE, AMYLASE in the last 168 hours. No results for input(s): AMMONIA in the last 168 hours. Cardiac Enzymes: No results for input(s): CKTOTAL, CKMB, CKMBINDEX,  TROPONINI in the last 168 hours. BNP (last 3 results) Recent Labs    02/11/18 0340  BNP 79.2    ProBNP (last 3 results) No results for input(s): PROBNP in the last 8760 hours.  CBG: No results for input(s): GLUCAP in the last 168 hours. Recent Results (from the past 240 hour(s))  Blood culture (routine x 2)     Status: Abnormal (Preliminary result)   Collection Time: 08/31/18  8:30 PM   Specimen: BLOOD  Result Value Ref Range Status   Specimen Description   Final    BLOOD LEFT ANTECUBITAL Performed at [facility], [place] [address], [place], [place] [address]    Special Requests   Final  BOTTLES DRAWN AEROBIC AND ANAEROBIC Blood Culture adequate volume Performed at [facility], [place] [place], [place] [address]    Culture  Setup Time   Final    GRAM POSITIVE COCCI IN CHAINS IN BOTH AEROBIC AND ANAEROBIC BOTTLES CRITICAL RESULT CALLED TO, READ BACK BY AND VERIFIED WITH: RN AT [facility] S GOUGE [phone] AT 1453 BY CM GRAM POSITIVE RODS CRITICAL RESULT CALLED TO, READ BACK BY AND VERIFIED WITH: PHARMD Y STEENWYK [phone] AT 800 AM BY CM    Culture (A)  Final    GROUP B STREP(S.AGALACTIAE)ISOLATED CULTURE REINCUBATED FOR BETTER GROWTH Performed at [facility], [place] [address], [place], [place] [address]    Report Status PENDING  Incomplete   Organism ID, Bacteria GROUP B STREP(S.AGALACTIAE)ISOLATED  Final      Susceptibility   Group b strep(s.agalactiae)isolated - MIC*    CLINDAMYCIN >=1 RESISTANT Resistant     AMPICILLIN <=0.25 SENSITIVE Sensitive     ERYTHROMYCIN >=8 RESISTANT Resistant     VANCOMYCIN 0.5 SENSITIVE Sensitive     CEFTRIAXONE <=0.12 SENSITIVE Sensitive     LEVOFLOXACIN 0.5 SENSITIVE Sensitive     * GROUP B STREP(S.AGALACTIAE)ISOLATED  Blood Culture ID Panel (Reflexed)     Status: Abnormal   Collection Time: 08/31/18  8:30 PM  Result Value Ref Range Status   Enterococcus species NOT DETECTED NOT DETECTED Final   Listeria  monocytogenes NOT DETECTED NOT DETECTED Final   Staphylococcus species NOT DETECTED NOT DETECTED Final   Staphylococcus aureus (BCID) NOT DETECTED NOT DETECTED Final   Streptococcus species DETECTED (A) NOT DETECTED Final    Comment: CRITICAL RESULT CALLED TO, READ BACK BY AND VERIFIED WITH: RN AT [facility] D GOUGE [phone] AT 1453 BY CM    Streptococcus agalactiae DETECTED (A) NOT DETECTED Final    Comment: CRITICAL RESULT CALLED TO, READ BACK BY AND VERIFIED WITH: RN AT [facility] S GOUGE [id number] AT 4431 BY CM    Streptococcus pneumoniae NOT DETECTED NOT DETECTED Final   Streptococcus pyogenes NOT DETECTED NOT DETECTED Final   Acinetobacter baumannii NOT DETECTED NOT DETECTED Final   Enterobacteriaceae species NOT DETECTED NOT DETECTED Final   Enterobacter cloacae complex NOT DETECTED NOT DETECTED Final   Escherichia coli NOT DETECTED NOT DETECTED Final   Klebsiella oxytoca NOT DETECTED NOT DETECTED Final   Klebsiella pneumoniae NOT DETECTED NOT DETECTED Final   Proteus species NOT DETECTED NOT DETECTED Final   Serratia marcescens NOT DETECTED NOT DETECTED Final   Haemophilus influenzae NOT DETECTED NOT DETECTED Final   Neisseria meningitidis NOT DETECTED NOT DETECTED Final   Pseudomonas aeruginosa NOT DETECTED NOT DETECTED Final   Candida albicans NOT DETECTED NOT DETECTED Final   Candida glabrata NOT DETECTED NOT DETECTED Final   Candida krusei NOT DETECTED NOT DETECTED Final   Candida parapsilosis NOT DETECTED NOT DETECTED Final   Candida tropicalis NOT DETECTED NOT DETECTED Final    Comment: Performed at [facility], [place] [place], [place] [address]  Blood culture (routine x 2)     Status: None (Preliminary result)   Collection Time: 08/31/18  8:40 PM   Specimen: BLOOD RIGHT WRIST  Result Value Ref Range Status   Specimen Description   Final    BLOOD RIGHT WRIST Performed at [facility], [address] [place], [place] [address]    Special Requests   Final     BOTTLES DRAWN AEROBIC AND ANAEROBIC Blood Culture adequate volume  Performed at [facility], [address] [place], [place] [address]    Culture   Final    NO GROWTH 3 DAYS Performed at [facility], [place] [address] [place], [place] [address]    Report Status PENDING  Incomplete  Urine culture     Status: Abnormal   Collection Time: 08/31/18  8:40 PM   Specimen: Urine, Random  Result Value Ref Range Status   Specimen Description   Final    URINE, RANDOM Performed at [facility], [address] [place], [place] [address]    Special Requests   Final    NONE Performed at [facility], [address] [place], [place] [address]    Culture >=100,000 COLONIES/mL ESCHERICHIA COLI (A)  Final   Report Status 09/03/2018 FINAL  Final   Organism ID, Bacteria ESCHERICHIA COLI (A)  Final      Susceptibility   Escherichia coli - MIC*    AMPICILLIN <=2 SENSITIVE Sensitive     CEFAZOLIN <=4 SENSITIVE Sensitive     CEFTRIAXONE <=1 SENSITIVE Sensitive     CIPROFLOXACIN >=4 RESISTANT Resistant     GENTAMICIN <=1 SENSITIVE Sensitive     IMIPENEM <=0.25 SENSITIVE Sensitive     NITROFURANTOIN <=16 SENSITIVE Sensitive     TRIMETH/SULFA <=20 SENSITIVE Sensitive     AMPICILLIN/SULBACTAM <=2 SENSITIVE Sensitive     PIP/TAZO <=4 SENSITIVE Sensitive     Extended ESBL NEGATIVE Sensitive     * >=100,000 COLONIES/mL ESCHERICHIA COLI  Blood culture (routine x 2)     Status: None (Preliminary result)   Collection Time: 09/01/18  2:53 PM   Specimen: BLOOD  Result Value Ref Range Status   Specimen Description BLOOD LEFT ANTECUBITAL  Final   Special Requests   Final    BOTTLES DRAWN AEROBIC AND ANAEROBIC Blood Culture results may not be optimal due to an inadequate volume of blood received in culture bottles   Culture   Final    NO GROWTH 2 DAYS Performed at [facility], [address], [place], [place] [address]    Report Status PENDING  Incomplete   Blood culture (routine x 2)     Status: None (Preliminary result)   Collection Time: 09/01/18  2:58 PM   Specimen: BLOOD LEFT FOREARM  Result Value Ref Range Status   Specimen Description BLOOD LEFT FOREARM  Final   Special Requests   Final    BOTTLES DRAWN AEROBIC AND ANAEROBIC Blood Culture adequate volume   Culture   Final    NO GROWTH 2 DAYS Performed at [facility], [place] [address], [place], [place] [address]    Report Status PENDING  Incomplete     Studies: No results found.  Scheduled Meds: . acidophilus  1 capsule Oral Daily  . ALPRAZolam  0.25 mg Oral Daily   And  . ALPRAZolam  0.5 mg Oral QHS  . amLODipine  5 mg Oral Daily   And  . irbesartan  150 mg Oral Daily  . aspirin EC  81 mg Oral Q M,W,F  . B-complex with vitamin C  1 tablet Oral Daily  . cholecalciferol  1,000 Units Oral Daily  . enoxaparin (LOVENOX) injection  30 mg Subcutaneous Q24H  . pantoprazole  40 mg Oral Daily  . potassium chloride SA  20 mEq Oral QHS  . simvastatin  10 mg Oral QHS  . sodium chloride flush  3 mL  Intravenous Once    Continuous Infusions: . cefTRIAXone (ROCEPHIN)  IV Stopped (09/02/18 2204)     Flora Lipps, MD  [facility] 09/03/2018

## 2018-09-03 NOTE — Evaluation (Signed)
Physical Therapy Evaluation Patient Details Name: [name] MRN: [id number] DOB: 01/31/1930 Today's Date: 09/03/2018   History of Present Illness  Pt is an 85 y/o female admitted secondary to UTI and batermia infection. PMH includes a fib, pacemaker placement, dCHF, CKD, and HTN.   Clinical Impression  Pt admitted secondary to problem above with deficits below. Pt requiring min guard A for mobility to chair using RW. Mild unsteadiness noted, however, no overt LOB noted. Pt reports she lives at home with her son, who is able to assist when needed. Will continue to follow acutely to maximize functional mobility independence and safety.     Follow Up Recommendations Home health PT;Supervision for mobility/OOB    Equipment Recommendations  None recommended by PT    Recommendations for Other Services       Precautions / Restrictions Precautions Precautions: Fall Restrictions Weight Bearing Restrictions: No      Mobility  Bed Mobility Overal bed mobility: Needs Assistance Bed Mobility: Supine to Sit     Supine to sit: Min guard     General bed mobility comments: Min guard for safety to sit up at EOB.   Transfers Overall transfer level: Needs assistance Equipment used: Rolling walker (2 wheeled) Transfers: Sit to/from [facility] Sit to Stand: Min guard Stand pivot transfers: Min guard       General transfer comment: Min guard for safety throughout transfer to chair. Mild unsteadiness noted, however, no overt LOB noted.   Ambulation/Gait                Stairs            Wheelchair Mobility    Modified Rankin (Stroke Patients Only)       Balance Overall balance assessment: Needs assistance Sitting-balance support: No upper extremity supported;Feet supported Sitting balance-Leahy Scale: Good     Standing balance support: Bilateral upper extremity supported;During functional activity Standing balance-Leahy Scale:  Poor Standing balance comment: Reliant on BUE support                              Pertinent Vitals/Pain Pain Assessment: No/denies pain    Home Living Family/patient expects to be discharged to:: Private residence Living Arrangements: Children(son) Available Help at Discharge: Family;Available 24 hours/day Type of Home: House Home Access: Stairs to enter Entrance Stairs-Rails: Right;Left;Can reach both Entrance Stairs-Number of Steps: 2 Home Layout: Two level;Able to live on main level with bedroom/bathroom Home Equipment: Gilford Rile - 2 wheels;Cane - single point;Shower seat;Grab bars - toilet;Grab bars - tub/shower      Prior Function Level of Independence: Needs assistance   Gait / Transfers Assistance Needed: Reports she uses RW for ambulation   ADL's / Homemaking Assistance Needed: Reports she needs assist from daughter or CNA for bathing.         Hand Dominance   Dominant Hand: Right    Extremity/Trunk Assessment   Upper Extremity Assessment Upper Extremity Assessment: Generalized weakness    Lower Extremity Assessment Lower Extremity Assessment: Generalized weakness    Cervical / Trunk Assessment Cervical / Trunk Assessment: Kyphotic  Communication   Communication: No difficulties  Cognition Arousal/Alertness: Awake/alert Behavior During Therapy: WFL for tasks assessed/performed Overall Cognitive Status: No family/caregiver present to determine baseline cognitive functioning  General Comments      Exercises     Assessment/Plan    PT Assessment Patient needs continued PT services  PT Problem List Decreased strength;Decreased balance;Decreased mobility;Decreased knowledge of use of DME       PT Treatment Interventions Gait training;DME instruction;Stair training;Therapeutic activities;Functional mobility training;Therapeutic exercise;Balance training;Patient/family education    PT  Goals (Current goals can be found in the Care Plan section)  Acute Rehab PT Goals Patient Stated Goal: to go home PT Goal Formulation: With patient Time For Goal Achievement: 09/17/18 Potential to Achieve Goals: Good    Frequency Min 3X/week   Barriers to discharge        Co-evaluation               AM-PAC PT "6 Clicks" Mobility  Outcome Measure Help needed turning from your back to your side while in a flat bed without using bedrails?: A Little Help needed moving from lying on your back to sitting on the side of a flat bed without using bedrails?: A Little Help needed moving to and from a bed to a chair (including a wheelchair)?: A Little Help needed standing up from a chair using your arms (e.g., wheelchair or bedside chair)?: A Little Help needed to walk in hospital room?: A Little Help needed climbing 3-5 steps with a railing? : A Lot 6 Click Score: 17    End of Session   Activity Tolerance: Patient tolerated treatment well Patient left: in chair;with call bell/phone within reach;with chair alarm set Nurse Communication: Mobility status PT Visit Diagnosis: Unsteadiness on feet (R26.81);Muscle weakness (generalized) (M62.81)    Time: 3220-2542 PT Time Calculation (min) (ACUTE ONLY): 17 min   Charges:   PT Evaluation $PT Eval Low Complexity: [place], PT, DPT  Acute Rehabilitation Services  Pager: [phone] Office: [phone]   Rudean Hitt 09/03/2018, 2:12 PM

## 2018-09-03 NOTE — Consult Note (Signed)
[facility] Infectious Disease       Reason for Consult: GBS bacteremia c/b pacemaker and history of mitral valve replacement    Referring Physician: Dr. Corrie Mckusick Pokhrel  Principal Problem:   Bacteremia Active Problems:   Chronic diastolic congestive heart failure (HCC)   Hypercalcemia   Permanent atrial fibrillation   Hyperlipidemia   Pacemaker   GERD (gastroesophageal reflux disease)   CKD (chronic kidney disease), stage III (HCC)   UTI (urinary tract infection)   Atrial fibrillation with slow ventricular response ([place])   . acidophilus  1 capsule Oral Daily  . ALPRAZolam  0.25 mg Oral Daily   And  . ALPRAZolam  0.5 mg Oral QHS  . amLODipine  5 mg Oral Daily   And  . irbesartan  150 mg Oral Daily  . aspirin EC  81 mg Oral Q M,W,F  . B-complex with vitamin C  1 tablet Oral Daily  . cholecalciferol  1,000 Units Oral Daily  . enoxaparin (LOVENOX) injection  30 mg Subcutaneous Q24H  . pantoprazole  40 mg Oral Daily  . potassium chloride SA  20 mEq Oral QHS  . simvastatin  10 mg Oral QHS  . sodium chloride flush  3 mL Intravenous Once    Recommendations: * TEE to assess for vegetations on valves and pacemaker leads * Continue on rocephin, duration of therapy pending TEE assessment  Assessment: [name] is an 85 yo female with history significant for dual chamber pacemaker placement in 2009 with recent manipulation and mitral valve replacement in 2001 who presents with GBS bacteremia in settings of recent E. Coli UTI.  Antibiotics: Discharged on Keflex on 7/17. Started on ceftriaxone on 7/18.  HPI: [name] is a 85 y.o. female with history significant for dual chamber pacemaker placement in 2009, mitral valve replacement with bioprosthetic valve in 2202, diastolic CHF, osteoarthritis, and community-acquired pneumonia. Patient was seen in ER on 7/17 for UTI and was discharged on keflex. Blood and urine cultures were collected at ER visit. Urine  cultures grew >= 100,000 col/ml E. Coli and blood culture grew Group B Strep. Patient called and ask to return to hospital due to blood culture results. Patient admitted for IV antibiotics, repeat cultures drawn on 7/18 and are negative at 48 hours. Of note, pacemaker was last manipulated on 08/20/18 at which time the device was changed out.    Review of Systems:  Review of Systems  Constitutional: Negative for chills and fever.  Respiratory: Positive for cough. Negative for shortness of breath.   Cardiovascular: Negative for chest pain and leg swelling.  Gastrointestinal: Negative for abdominal pain, nausea and vomiting.  Genitourinary: Negative for dysuria, frequency and urgency.  Musculoskeletal: Negative for back pain, joint pain and neck pain.    All other systems reviewed and are negative    Past Medical History:  Diagnosis Date  . Anxiety   . Arthritis    "mostly my hands" (01/29/2015)  . Basal cell carcinoma of forehead   . Benign essential tremor   . CAP (community acquired pneumonia) 01/29/2015  . CHF (congestive heart failure) ([place])   . Depression   . Edema of both legs 04-23-2013  pt states feet swelling (wears ted hose)   bilateral venous ablation procedures done in IR, L>R  . GERD (gastroesophageal reflux disease)   . H/O hiatal hernia   . Hyperlipidemia   . Hyperparathyroidism ([place])    MILD--  STABLE  . Hypertension   .  Kidney stones   . Osteopenia   . Permanent atrial fibrillation    CARDIOLOGIST-- DR IRSWNIOE  . Pneumonia "several times"  . Presence of permanent cardiac pacemaker    LAST PACER CHECK  04-23-2013  IN [facility]  . Right ureteral stone   . S/P mitral valve replacement with bioprosthetic valve    04-03-2009  . Schatzki's ring   . Sick sinus syndrome ([place])    S/P  PACEMAKER  2009  . Thrombocytopenia, immune ([place])    CHRONIC---  HEMOTOLOGIST---  DR ENNEVER  . Venous insufficiency, peripheral   . Wears glasses     Social History   Tobacco Use   . Smoking status: Never Smoker  . Smokeless tobacco: Never Used  . Tobacco comment: never used tobacco  Substance Use Topics  . Alcohol use: No    Alcohol/week: 0.0 standard drinks  . Drug use: No    Family History  Problem Relation Age of Onset  . Cancer Mother   . Heart attack Father     Allergies  Allergen Reactions  . Zoloft [Sertraline Hcl]     Physical Exam: Constitutional: No acute distress Vitals:   09/02/18 2136 09/03/18 0539  BP: (!) 111/51 (!) 114/47  Pulse: 85 68  Resp: 18 18  Temp: 97.8 F (36.6 C) 98 F (36.7 C)  SpO2: 96% 97%   EYES: anicteric Cardiovascular: Regular rate and rhythm without m/r/g Respiratory: CTAB without wheezing/rhonchi/rales GI: Soft, NT, ND Musculoskeletal: Warm, no peripheral edema Skin: No rashes, lesions, or ulcers Hematologic: Bruising on right arm by IV site, no evidence for acute bleeding  Lab Results  Component Value Date   WBC 5.2 09/03/2018   HGB 12.1 09/03/2018   HCT 37.9 09/03/2018   MCV 95.7 09/03/2018   PLT 91 (L) 09/03/2018    Lab Results  Component Value Date   CREATININE 1.14 (H) 09/03/2018   BUN 20 09/03/2018   NA 139 09/03/2018   K 5.0 09/03/2018   CL 106 09/03/2018   CO2 26 09/03/2018    Lab Results  Component Value Date   ALT 11 09/02/2018   AST 20 09/02/2018   ALKPHOS 41 09/02/2018     Microbiology: Recent Results (from the past 240 hour(s))  Blood culture (routine x 2)     Status: Abnormal (Preliminary result)   Collection Time: 08/31/18  8:30 PM   Specimen: BLOOD  Result Value Ref Range Status   Specimen Description   Final    BLOOD LEFT ANTECUBITAL Performed at [facility], [place] [address], [place], [place] [address]    Special Requests   Final    BOTTLES DRAWN AEROBIC AND ANAEROBIC Blood Culture adequate volume Performed at [facility], [address] [place], [place] [address]    Culture  Setup Time   Final    GRAM POSITIVE COCCI IN CHAINS IN BOTH AEROBIC  AND ANAEROBIC BOTTLES CRITICAL RESULT CALLED TO, READ BACK BY AND VERIFIED WITH: RN AT [facility] S GOUGE [id number] AT 1453 BY CM GRAM POSITIVE RODS CRITICAL RESULT CALLED TO, READ BACK BY AND VERIFIED WITH: PHARMD Y STEENWYK [id number] AT 800 AM BY CM    Culture (A)  Final    GROUP B STREP(S.AGALACTIAE)ISOLATED CULTURE REINCUBATED FOR BETTER GROWTH Performed at [facility], [place] [address], [place],  [address]    Report Status PENDING  Incomplete   Organism ID, Bacteria GROUP B STREP(S.AGALACTIAE)ISOLATED  Final      Susceptibility  Group b strep(s.agalactiae)isolated - MIC*    CLINDAMYCIN >=1 RESISTANT Resistant     AMPICILLIN <=0.25 SENSITIVE Sensitive     ERYTHROMYCIN >=8 RESISTANT Resistant     VANCOMYCIN 0.5 SENSITIVE Sensitive     CEFTRIAXONE <=0.12 SENSITIVE Sensitive     LEVOFLOXACIN 0.5 SENSITIVE Sensitive     * GROUP B STREP(S.AGALACTIAE)ISOLATED  Blood Culture ID Panel (Reflexed)     Status: Abnormal   Collection Time: 08/31/18  8:30 PM  Result Value Ref Range Status   Enterococcus species NOT DETECTED NOT DETECTED Final   Listeria monocytogenes NOT DETECTED NOT DETECTED Final   Staphylococcus species NOT DETECTED NOT DETECTED Final   Staphylococcus aureus (BCID) NOT DETECTED NOT DETECTED Final   Streptococcus species DETECTED (A) NOT DETECTED Final    Comment: CRITICAL RESULT CALLED TO, READ BACK BY AND VERIFIED WITH: RN AT [facility] D GOUGE [id number] AT 1453 BY CM    Streptococcus agalactiae DETECTED (A) NOT DETECTED Final    Comment: CRITICAL RESULT CALLED TO, READ BACK BY AND VERIFIED WITH: RN AT [facility] S GOUGE [id number] AT 6222 BY CM    Streptococcus pneumoniae NOT DETECTED NOT DETECTED Final   Streptococcus pyogenes NOT DETECTED NOT DETECTED Final   Acinetobacter baumannii NOT DETECTED NOT DETECTED Final   Enterobacteriaceae species NOT DETECTED NOT DETECTED Final   Enterobacter cloacae complex NOT DETECTED NOT DETECTED Final   Escherichia coli NOT DETECTED NOT DETECTED  Final   Klebsiella oxytoca NOT DETECTED NOT DETECTED Final   Klebsiella pneumoniae NOT DETECTED NOT DETECTED Final   Proteus species NOT DETECTED NOT DETECTED Final   Serratia marcescens NOT DETECTED NOT DETECTED Final   Haemophilus influenzae NOT DETECTED NOT DETECTED Final   Neisseria meningitidis NOT DETECTED NOT DETECTED Final   Pseudomonas aeruginosa NOT DETECTED NOT DETECTED Final   Candida albicans NOT DETECTED NOT DETECTED Final   Candida glabrata NOT DETECTED NOT DETECTED Final   Candida krusei NOT DETECTED NOT DETECTED Final   Candida parapsilosis NOT DETECTED NOT DETECTED Final   Candida tropicalis NOT DETECTED NOT DETECTED Final    Comment: Performed at [facility], [address], [place], [place] [address]  Blood culture (routine x 2)     Status: None (Preliminary result)   Collection Time: 08/31/18  8:40 PM   Specimen: BLOOD RIGHT WRIST  Result Value Ref Range Status   Specimen Description   Final    BLOOD RIGHT WRIST Performed at [facility], [address] [place], [place] [address]    Special Requests   Final    BOTTLES DRAWN AEROBIC AND ANAEROBIC Blood Culture adequate volume Performed at [facility], [address] [place], [place] [address]    Culture   Final    NO GROWTH 3 DAYS Performed at [facility], [place] [address], [place], [place] [address]    Report Status PENDING  Incomplete  Urine culture     Status: Abnormal   Collection Time: 08/31/18  8:40 PM   Specimen: Urine, Random  Result Value Ref Range Status   Specimen Description   Final    URINE, RANDOM Performed at [facility], [address] [place],  [address]    Special Requests   Final    NONE Performed at [facility], [address] [place], [place] [address]    Culture >=100,000 COLONIES/mL ESCHERICHIA COLI (A)  Final   Report Status 09/03/2018 FINAL  Final   Organism ID, Bacteria ESCHERICHIA COLI (A)  Final       Susceptibility   Escherichia coli - MIC*    AMPICILLIN <=2 SENSITIVE Sensitive     CEFAZOLIN <=4 SENSITIVE Sensitive     CEFTRIAXONE <=1 SENSITIVE Sensitive     CIPROFLOXACIN >=4 RESISTANT Resistant     GENTAMICIN <=1 SENSITIVE Sensitive     IMIPENEM <=0.25 SENSITIVE Sensitive     NITROFURANTOIN <=16 SENSITIVE Sensitive     TRIMETH/SULFA <=20 SENSITIVE Sensitive     AMPICILLIN/SULBACTAM <=2 SENSITIVE Sensitive     PIP/TAZO <=4 SENSITIVE Sensitive     Extended ESBL NEGATIVE Sensitive     * >=100,000 COLONIES/mL ESCHERICHIA COLI  Blood culture (routine x 2)     Status: None (Preliminary result)   Collection Time: 09/01/18  2:53 PM   Specimen: BLOOD  Result Value Ref Range Status   Specimen Description BLOOD LEFT ANTECUBITAL  Final   Special Requests   Final    BOTTLES DRAWN AEROBIC AND ANAEROBIC Blood Culture results may not be optimal due to an inadequate volume of blood received in culture bottles   Culture   Final    NO GROWTH 2 DAYS Performed at [facility] [address] [place], [place] [address]    Report Status PENDING  Incomplete  Blood culture (routine x 2)     Status: None (Preliminary result)   Collection Time: 09/01/18  2:58 PM   Specimen: BLOOD LEFT FOREARM  Result Value Ref Range Status   Specimen Description BLOOD LEFT FOREARM  Final   Special Requests   Final    BOTTLES DRAWN AEROBIC AND ANAEROBIC Blood Culture adequate volume   Culture   Final    NO GROWTH 2 DAYS Performed at [facility], [place] [address], [place], [place] [address]    Report Status PENDING  Incomplete    Jeanmarie Hubert, MD, PGY1 [facility] [place] [email or web address] 09/03/2018, 12:34 PM

## 2018-09-04 ENCOUNTER — Telehealth: Payer: Self-pay | Admitting: Emergency Medicine

## 2018-09-04 LAB — BASIC METABOLIC PANEL
Anion gap: 6 (ref 5–15)
BUN: 22 mg/dL (ref 8–23)
CO2: 27 mmol/L (ref 22–32)
Calcium: 10.3 mg/dL (ref 8.9–10.3)
Chloride: 108 mmol/L (ref 98–111)
Creatinine, Ser: 1.11 mg/dL — ABNORMAL HIGH (ref 0.44–1.00)
GFR calc Af Amer: 52 mL/min — ABNORMAL LOW (ref >60)
GFR calc non Af Amer: 45 mL/min — ABNORMAL LOW (ref >60)
Glucose, Bld: 92 mg/dL (ref 70–99)
Potassium: 5 mmol/L (ref 3.5–5.1)
Sodium: 141 mmol/L (ref 135–145)

## 2018-09-04 LAB — CBC
HCT: 38.1 % (ref 36.0–46.0)
Hemoglobin: 12 g/dL (ref 12.0–15.0)
MCH: 30.8 pg (ref 26.0–34.0)
MCHC: 31.5 g/dL (ref 30.0–36.0)
MCV: 97.9 fL (ref 80.0–100.0)
Platelets: 92 10*3/uL — ABNORMAL LOW (ref 150–400)
RBC: 3.89 MIL/uL (ref 3.87–5.11)
RDW: 13.6 % (ref 11.5–15.5)
WBC: 5.4 10*3/uL (ref 4.0–10.5)
nRBC: 0 % (ref 0.0–0.2)

## 2018-09-04 LAB — CULTURE, BLOOD (ROUTINE X 2): Special Requests: ADEQUATE

## 2018-09-04 NOTE — Progress Notes (Signed)
PROGRESS NOTE  Simaya Wilkinson ZOX:[id number] DOB: 03-01-1929 DOA: 09/01/2018 PCP: Lawerance Cruel, MD   LOS: 3 days   Brief narrative: [name] is a 85 y.o. female with medical history significant of essential tremors, osteoarthritis, basal cell carcinoma, diastolic CHF, community-acquired pneumonia who was seen in the ER  and diagnosed with UTI.  Patient was discharged home on cephalexin.  She had blood and urine cultures collected.  Blood cultures came back positive for Streptococcus.  Patient was called and asked to come back to the ER  and was admitted for IV antibiotics.  She denied any fever on presentation.  Patient has history of dual-chamber pacemaker placement.. In the ED, Temperature 97.5 blood pressure 133/58 pulse 94 respiratory rate of 26 oxygen sat 97% room air.  White count is 5.7 hemoglobin 11.9 and platelets 94.  Creatinine is 1.26 BUN 20 and calcium 11.  COVID-19 was negative on July 2.  Patient was then admitted for IV antibiotics.  Assessment/Plan:  Principal Problem:   Bacteremia Active Problems:   Chronic diastolic congestive heart failure (HCC)   Hypercalcemia   Permanent atrial fibrillation   Hyperlipidemia   Pacemaker   GERD (gastroesophageal reflux disease)   CKD (chronic kidney disease), stage III (HCC)   UTI (urinary tract infection)   Atrial fibrillation with slow ventricular response ([place])  Bacteremia due to streptococcal agalactiae from blood cultures done on 08/31/2018. on rocephin IV. will continue for now.  Blood cultures from 09/01/2018 are negative so far.  Patient is afebrile  without leukocytosis.  Chest x-ray was negative for acute infection.  Due to presence of pacemaker, await TEE to rule out pacer lead vegetation..  ID recommends PICC line placement 09/05/2018 for possible prolonged IV antibiotic..  E. coli UTI:  Urine Culture from 08/31/2018 reviewed.  E. coli resistant to Cipro but sensitive to other antibiotics.  Continue Rocephin for  now.  hyperlipidemia: Continue with statin.   Essential hypertension.  Continue amlodipine, Lasix for now.  Blood pressure is stable.  chronic diastolic heart failure, history of pacemaker placement.: Continue with current treatment, compensated.  Considering TEE to rule out vegetation.  GERD: Continue with PPIs  Chronic kidney disease stage III: Appears to be at baseline.  Monitor BMP periodically   History of atrial fibrillation:  Currently in sinus rhythm.  Not on anticoagulation.  On prophylactic Lovenox  Esential tremors: Chronic.  Appears to be at baseline.  VTE Prophylaxis: Lovenox   Code Status: Full code  Family Communication: I spoke with the patient's daughter [name] on the phone and updated her about the clinical condition of the patient and the potential need for PICC line placement in the morning.   Disposition Plan: Home likely in 2 to 3 days.  Request for TEE, pending.  PICC line planned for tomorrow as per ID   Consultants:  Infectious disease Dr. Linus Salmons  Procedures:  None, TEE pending  Antibiotics:  Rocephin IV 7/18>  Anti-infectives (From admission, onward)   Start     Dose/Rate Route Frequency Ordered Stop   09/01/18 2100  cefTRIAXone (ROCEPHIN) 2 g in sodium chloride 0.9 % 100 mL IVPB     2 g 200 mL/hr over 30 Minutes Intravenous Every 24 hours 09/01/18 2055     09/01/18 1900  cefTRIAXone (ROCEPHIN) 1 g in sodium chloride 0.9 % 100 mL IVPB  Status:  Discontinued     1 g 200 mL/hr over 30 Minutes Intravenous  Once 09/01/18 1845 09/01/18 2101  Subjective: Patient denies shortness of breath, cough, fever, chills or rigor.  Objective: Vitals:   09/03/18 2203 09/04/18 0539  BP: (!) 132/53 (!) 123/59  Pulse: 81 61  Resp:    Temp: 98.1 F (36.7 C) 97.6 F (36.4 C)  SpO2: 98% 98%    Intake/Output Summary (Last 24 hours) at 09/04/2018 1201 Last data filed at 09/04/2018 1000 Gross per 24 hour  Intake 580 ml  Output 1000 ml  Net -420  ml   There were no vitals filed for this visit. There is no height or weight on file to calculate BMI.   Physical Exam: General: Thinly built, not in obvious distress HENT: Normocephalic, pupils equally reacting to light and accommodation.  No scleral pallor or icterus noted. Oral mucosa is moist.  Chest:  Clear breath sounds.  Diminished breath sounds bilaterally. No crackles or wheezes.  Left chest wall pacer without obvious erythema or induration. CVS: S1 &S2 heard. No murmur.  Regular rate and rhythm. Abdomen: Soft, nontender, nondistended.  Bowel sounds are heard.  Liver is not palpable, no abdominal mass palpated Extremities: No cyanosis, clubbing or edema.  Peripheral pulses are palpable. Psych: Alert, awake and oriented, normal mood CNS:  No cranial nerve deficits.  Power equal in all extremities.  No sensory deficits noted.  Mild tremors noted in the upper extremity.   Skin: Warm and dry.  No rashes noted.   Data Review: I have personally reviewed the following laboratory data and studies,  CBC: Recent Labs  Lab 09/01/18 1700 09/01/18 2335 09/02/18 0400 09/03/18 0317 09/04/18 0339  WBC 5.7 5.7 5.3 5.2 5.4  NEUTROABS 3.3  --   --   --   --   HGB 13.3 11.9* 12.1 12.1 12.0  HCT 42.4 37.5 37.9 37.9 38.1  MCV 97.9 97.2 97.4 95.7 97.9  PLT 108* 94* 94* 91* 92*   Basic Metabolic Panel: Recent Labs  Lab 08/31/18 1955 09/01/18 1700 09/01/18 2335 09/02/18 0400 09/03/18 0317 09/04/18 0339  NA 138 141  --  141 139 141  K 4.1 4.2  --  4.7 5.0 5.0  CL 101 106  --  106 106 108  CO2 23 26  --  27 26 27   GLUCOSE 116* 117*  --  89 88 92  BUN 24* 20  --  19 20 22   CREATININE 1.35* 1.44* 1.26* 1.17* 1.14* 1.11*  CALCIUM 11.3* 11.0*  --  10.4* 10.3 10.3   Liver Function Tests: Recent Labs  Lab 08/31/18 1955 09/01/18 1700 09/02/18 0400  AST 25 23 20   ALT 15 13 11   ALKPHOS 54 51 41  BILITOT 1.0 0.8 0.6  PROT 6.6 6.0* 5.1*  ALBUMIN 4.1 3.8 3.1*   No results for  input(s): LIPASE, AMYLASE in the last 168 hours. No results for input(s): AMMONIA in the last 168 hours. Cardiac Enzymes: No results for input(s): CKTOTAL, CKMB, CKMBINDEX, TROPONINI in the last 168 hours. BNP (last 3 results) Recent Labs    02/11/18 0340  BNP 79.2    ProBNP (last 3 results) No results for input(s): PROBNP in the last 8760 hours.  CBG: No results for input(s): GLUCAP in the last 168 hours. Recent Results (from the past 240 hour(s))  Blood culture (routine x 2)     Status: Abnormal   Collection Time: 08/31/18  8:30 PM   Specimen: BLOOD  Result Value Ref Range Status   Specimen Description   Final    BLOOD LEFT ANTECUBITAL Performed at [facility]  [facility], [place] [address], [place], [place] [address]    Special Requests   Final    BOTTLES DRAWN AEROBIC AND ANAEROBIC Blood Culture adequate volume Performed at [facility], [address] [place], [place] [address]    Culture  Setup Time   Final    GRAM POSITIVE COCCI IN CHAINS IN BOTH AEROBIC AND ANAEROBIC BOTTLES CRITICAL RESULT CALLED TO, READ BACK BY AND VERIFIED WITH: RN AT [facility] S GOUGE [id number] AT 1453 BY CM GRAM POSITIVE RODS CRITICAL RESULT CALLED TO, READ BACK BY AND VERIFIED WITH: PHARMD Y STEENWYK [id number] AT 800 AM BY CM    Culture (A)  Final    GROUP B STREP(S.AGALACTIAE)ISOLATED STAPHYLOCOCCUS SPECIES (COAGULASE NEGATIVE) ACTINOMYCES NEUII Standardized susceptibility testing for this organism is not available. Performed at [facility], [place] [address] [place], [place] [address]    Report Status 09/04/2018 FINAL  Final   Organism ID, Bacteria GROUP B STREP(S.AGALACTIAE)ISOLATED  Final      Susceptibility   Group b strep(s.agalactiae)isolated - MIC*    CLINDAMYCIN >=1 RESISTANT Resistant     AMPICILLIN <=0.25 SENSITIVE Sensitive     ERYTHROMYCIN >=8 RESISTANT Resistant     VANCOMYCIN 0.5 SENSITIVE Sensitive     CEFTRIAXONE <=0.12 SENSITIVE Sensitive     LEVOFLOXACIN 0.5  SENSITIVE Sensitive     * GROUP B STREP(S.AGALACTIAE)ISOLATED  Blood Culture ID Panel (Reflexed)     Status: Abnormal   Collection Time: 08/31/18  8:30 PM  Result Value Ref Range Status   Enterococcus species NOT DETECTED NOT DETECTED Final   Listeria monocytogenes NOT DETECTED NOT DETECTED Final   Staphylococcus species NOT DETECTED NOT DETECTED Final   Staphylococcus aureus (BCID) NOT DETECTED NOT DETECTED Final   Streptococcus species DETECTED (A) NOT DETECTED Final    Comment: CRITICAL RESULT CALLED TO, READ BACK BY AND VERIFIED WITH: RN AT [facility] D GOUGE [id number] AT 1453 BY CM    Streptococcus agalactiae DETECTED (A) NOT DETECTED Final    Comment: CRITICAL RESULT CALLED TO, READ BACK BY AND VERIFIED WITH: RN AT [facility] S GOUGE [id number] AT 7619 BY CM    Streptococcus pneumoniae NOT DETECTED NOT DETECTED Final   Streptococcus pyogenes NOT DETECTED NOT DETECTED Final   Acinetobacter baumannii NOT DETECTED NOT DETECTED Final   Enterobacteriaceae species NOT DETECTED NOT DETECTED Final   Enterobacter cloacae complex NOT DETECTED NOT DETECTED Final   Escherichia coli NOT DETECTED NOT DETECTED Final   Klebsiella oxytoca NOT DETECTED NOT DETECTED Final   Klebsiella pneumoniae NOT DETECTED NOT DETECTED Final   Proteus species NOT DETECTED NOT DETECTED Final   Serratia marcescens NOT DETECTED NOT DETECTED Final   Haemophilus influenzae NOT DETECTED NOT DETECTED Final   Neisseria meningitidis NOT DETECTED NOT DETECTED Final   Pseudomonas aeruginosa NOT DETECTED NOT DETECTED Final   Candida albicans NOT DETECTED NOT DETECTED Final   Candida glabrata NOT DETECTED NOT DETECTED Final   Candida krusei NOT DETECTED NOT DETECTED Final   Candida parapsilosis NOT DETECTED NOT DETECTED Final   Candida tropicalis NOT DETECTED NOT DETECTED Final    Comment: Performed at [facility], [place] [address], [place], [place] [address]  Blood culture (routine x 2)     Status: None (Preliminary result)    Collection Time: 08/31/18  8:40 PM   Specimen: BLOOD RIGHT WRIST  Result Value Ref Range Status   Specimen Description   Final    BLOOD RIGHT WRIST Performed at  [place], [address] [place], [place] [address]    Special Requests   Final    BOTTLES DRAWN AEROBIC AND ANAEROBIC Blood Culture adequate volume Performed at [facility], [address] [place], [place] [address]    Culture   Final    NO GROWTH 4 DAYS Performed at [facility], [place] [address] [place], [place] [address]    Report Status PENDING  Incomplete  Urine culture     Status: Abnormal   Collection Time: 08/31/18  8:40 PM   Specimen: Urine, Random  Result Value Ref Range Status   Specimen Description   Final    URINE, RANDOM Performed at [facility], [address] [place], [place] [address]    Special Requests   Final    NONE Performed at [facility], [place] [place], [place] [address]    Culture >=100,000 COLONIES/mL ESCHERICHIA COLI (A)  Final   Report Status 09/03/2018 FINAL  Final   Organism ID, Bacteria ESCHERICHIA COLI (A)  Final      Susceptibility   Escherichia coli - MIC*    AMPICILLIN <=2 SENSITIVE Sensitive     CEFAZOLIN <=4 SENSITIVE Sensitive     CEFTRIAXONE <=1 SENSITIVE Sensitive     CIPROFLOXACIN >=4 RESISTANT Resistant     GENTAMICIN <=1 SENSITIVE Sensitive     IMIPENEM <=0.25 SENSITIVE Sensitive     NITROFURANTOIN <=16 SENSITIVE Sensitive     TRIMETH/SULFA <=20 SENSITIVE Sensitive     AMPICILLIN/SULBACTAM <=2 SENSITIVE Sensitive     PIP/TAZO <=4 SENSITIVE Sensitive     Extended ESBL NEGATIVE Sensitive     * >=100,000 COLONIES/mL ESCHERICHIA COLI  Blood culture (routine x 2)     Status: None (Preliminary result)   Collection Time: 09/01/18  2:53 PM   Specimen: BLOOD  Result Value Ref Range Status   Specimen Description BLOOD LEFT ANTECUBITAL  Final   Special Requests   Final    BOTTLES DRAWN AEROBIC AND ANAEROBIC  Blood Culture results may not be optimal due to an inadequate volume of blood received in culture bottles   Culture   Final    NO GROWTH 3 DAYS Performed at [facility], [address], [place], [place] [address]    Report Status PENDING  Incomplete  Blood culture (routine x 2)     Status: None (Preliminary result)   Collection Time: 09/01/18  2:58 PM   Specimen: BLOOD LEFT FOREARM  Result Value Ref Range Status   Specimen Description BLOOD LEFT FOREARM  Final   Special Requests   Final    BOTTLES DRAWN AEROBIC AND ANAEROBIC Blood Culture adequate volume   Culture   Final    NO GROWTH 3 DAYS Performed at [facility], [place] [address], [place], [place] [address]    Report Status PENDING  Incomplete  SARS Coronavirus 2 (CEPHEID - Performed in [place] hospital lab), Hosp Order     Status: None   Collection Time: 09/03/18  3:45 PM   Specimen: Nasopharyngeal Swab  Result Value Ref Range Status   SARS Coronavirus 2 NEGATIVE NEGATIVE Final    Comment: (NOTE) If result is NEGATIVE SARS-CoV-2 target nucleic acids are NOT DETECTED. The SARS-CoV-2 RNA is generally detectable in upper and lower  respiratory specimens during the acute phase of infection. The lowest  concentration of SARS-CoV-2 viral copies this assay can detect is 250  copies / mL. A negative result does not preclude SARS-CoV-2 infection  and should not be used as the sole basis for treatment or other  patient management decisions.  A negative result may occur with  improper specimen collection / handling, submission of specimen other  than nasopharyngeal swab, presence of viral mutation(s) within the  areas targeted by this assay, and inadequate number of viral copies  (<250 copies / mL). A negative result must be combined with clinical  observations, patient history, and epidemiological information. If result is POSITIVE SARS-CoV-2 target nucleic acids are DETECTED. The SARS-CoV-2 RNA is generally  detectable in upper and lower  respiratory specimens dur ing the acute phase of infection.  Positive  results are indicative of active infection with SARS-CoV-2.  Clinical  correlation with patient history and other diagnostic information is  necessary to determine patient infection status.  Positive results do  not rule out bacterial infection or co-infection with other viruses. If result is PRESUMPTIVE POSTIVE SARS-CoV-2 nucleic acids MAY BE PRESENT.   A presumptive positive result was obtained on the submitted specimen  and confirmed on repeat testing.  While 2019 novel coronavirus  (SARS-CoV-2) nucleic acids may be present in the submitted sample  additional confirmatory testing may be necessary for epidemiological  and / or clinical management purposes  to differentiate between  SARS-CoV-2 and other Sarbecovirus currently known to infect humans.  If clinically indicated additional testing with an alternate test  methodology [phone]) is advised. The SARS-CoV-2 RNA is generally  detectable in upper and lower respiratory sp ecimens during the acute  phase of infection. The expected result is Negative. Fact Sheet for Patients:  StrictlyIdeas.no Fact Sheet for Healthcare Providers: BankingDealers.co.za This test is not yet approved or cleared by the [place] FDA and has been authorized for detection and/or diagnosis of SARS-CoV-2 by FDA under an Emergency Use Authorization (EUA).  This EUA will remain in effect (meaning this test can be used) for the duration of the COVID-19 declaration under Section 564(b)(1) of the Act, 21 U.S.C. section 360bbb-3(b)(1), unless the authorization is terminated or revoked sooner. Performed at [facility], [place] [address] [place], [place] [address]      Studies: No results found.  Scheduled Meds: . acidophilus  1 capsule Oral Daily  . ALPRAZolam  0.25 mg Oral Daily   And  . ALPRAZolam   0.5 mg Oral QHS  . amLODipine  5 mg Oral Daily   And  . irbesartan  150 mg Oral Daily  . aspirin EC  81 mg Oral Q M,W,F  . B-complex with vitamin C  1 tablet Oral Daily  . cholecalciferol  1,000 Units Oral Daily  . enoxaparin (LOVENOX) injection  30 mg Subcutaneous Q24H  . pantoprazole  40 mg Oral Daily  . potassium chloride SA  20 mEq Oral QHS  . simvastatin  10 mg Oral QHS  . sodium chloride flush  3 mL Intravenous Once    Continuous Infusions: . cefTRIAXone (ROCEPHIN)  IV 2 g (09/03/18 2202)     Flora Lipps, MD  [facility] 09/04/2018

## 2018-09-04 NOTE — Progress Notes (Addendum)
TC to son Jeneen Rinks) for update per patient request. NA left VM for him to return my call.   13:54 Son returned call and was given update on patient with plans to do a TEE and place a PICC line for prolonged ABX therapy at home. Will continue to update as needed.   Cathlean Marseilles Tamala Julian, MSN, RN, [place], AGCNS

## 2018-09-04 NOTE — Progress Notes (Signed)
Physical Therapy Treatment Patient Details Name: [name] MRN: [id number] DOB: 11-25-1929 Today's Date: 09/04/2018    History of Present Illness 85 y/o female admitted secondary to UTI and batermia infection. PMH includes a fib, pacemaker placement, dCHF, CKD, and HTN.     PT Comments    Pt was seen for progression of gait and standing balance control, noted improved control of both transfers and endurance.   Pt was assisted to BR then to hallway for gait.  Noted her use of purwick, had to remove for gait but was able to get to BR before beginning to urinate.  Pt is up in chair as before her walk, and will notify nursing to replace purwick as they decide is needed.     Follow Up Recommendations  Home health PT;Supervision for mobility/OOB     Equipment Recommendations  None recommended by PT    Recommendations for Other Services       Precautions / Restrictions Precautions Precautions: Fall Precaution Comments: urine incontinence Restrictions Weight Bearing Restrictions: No    Mobility  Bed Mobility               General bed mobility comments: up in chair when PT arrived  Transfers Overall transfer level: Needs assistance Equipment used: Rolling walker (2 wheeled) Transfers: Sit to/from Stand Sit to Stand: Min guard;Min assist         General transfer comment: min assist from much lower height of commode seat  Ambulation/Gait Ambulation/Gait assistance: Min guard(for safety) Gait Distance (Feet): 120 Feet Assistive device: Rolling walker (2 wheeled);1 person hand held assist Gait Pattern/deviations: Step-through pattern;Decreased stride length;Wide base of support Gait velocity: reduced Gait velocity interpretation: <1.31 ft/sec, indicative of household ambulator General Gait Details: requires a little extra room for turns but is aware of how to maneuver a walker   Stairs             Wheelchair Mobility    Modified Rankin (Stroke  Patients Only)       Balance     Sitting balance-Leahy Scale: Good     Standing balance support: Bilateral upper extremity supported;During functional activity Standing balance-Leahy Scale: Fair Standing balance comment: less than fair with dynamic balance                            Cognition Arousal/Alertness: Awake/alert Behavior During Therapy: WFL for tasks assessed/performed Overall Cognitive Status: No family/caregiver present to determine baseline cognitive functioning                                 General Comments: has mild confusion about some details      Exercises      General Comments General comments (skin integrity, edema, etc.): pt is up to walk with minimal help, very motivated and was aware of maneuvering but is unsure of whether she wants to return to bed.  Seems confused about this despite PT offering to help her      Pertinent Vitals/Pain Pain Assessment: No/denies pain    Home Living                      Prior Function            PT Goals (current goals can now be found in the care plan section) Acute Rehab PT Goals Patient Stated Goal: to go home  Progress towards PT goals: Progressing toward goals    Frequency    Min 3X/week      PT Plan Current plan remains appropriate    Co-evaluation              AM-PAC PT "6 Clicks" Mobility   Outcome Measure  Help needed turning from your back to your side while in a flat bed without using bedrails?: None Help needed moving from lying on your back to sitting on the side of a flat bed without using bedrails?: A Little Help needed moving to and from a bed to a chair (including a wheelchair)?: A Little Help needed standing up from a chair using your arms (e.g., wheelchair or bedside chair)?: A Little Help needed to walk in hospital room?: A Little Help needed climbing 3-5 steps with a railing? : A Lot 6 Click Score: 18    End of Session Equipment  Utilized During Treatment: Gait belt Activity Tolerance: Patient limited by fatigue Patient left: in chair;with call bell/phone within reach;with chair alarm set Nurse Communication: Mobility status PT Visit Diagnosis: Unsteadiness on feet (R26.81);Muscle weakness (generalized) (M62.81)     Time: 7356-7014 PT Time Calculation (min) (ACUTE ONLY): 25 min  Charges:  $Gait Training: 8-22 mins $Therapeutic Activity: 8-22 mins                    Ramond Dial 09/04/2018, 3:37 PM   Mee Hives, PT MS Acute Rehab Dept. Number: [place] and [place]

## 2018-09-04 NOTE — Progress Notes (Signed)
[facility] Infectious Disease   Reason for visit: Follow up on GBS bacteremia  Interval History: Repeat blood cultures from 7/18 negative x3 days. Afebrile since admission. WBC WNL.   Subjective: Patient reports she is doing well. She denies chest pain, SOB, fever, chills, nausea, vomiting. Repeats one episode of loose stools.   Physical Exam: Constitutional: Patients appears in NAD Vitals:   09/03/18 2203 09/04/18 0539  BP: (!) 132/53 (!) 123/59  Pulse: 81 61  Resp:    Temp: 98.1 F (36.7 C) 97.6 F (36.4 C)  SpO2: 98% 98%  Respiratory: Normal respiratory effort; CTAB Cardiovascular: RRR GI: soft, NT, ND  Review of Systems: Review of Systems  Constitutional: Negative for chills and fever.  Respiratory: Negative for shortness of breath.   Cardiovascular: Negative for chest pain.  Gastrointestinal: Negative for nausea and vomiting.     Lab Results  Component Value Date   WBC 5.4 09/04/2018   HGB 12.0 09/04/2018   HCT 38.1 09/04/2018   MCV 97.9 09/04/2018   PLT 92 (L) 09/04/2018    Lab Results  Component Value Date   CREATININE 1.11 (H) 09/04/2018   BUN 22 09/04/2018   NA 141 09/04/2018   K 5.0 09/04/2018   CL 108 09/04/2018   CO2 27 09/04/2018    Lab Results  Component Value Date   ALT 11 09/02/2018   AST 20 09/02/2018   ALKPHOS 41 09/02/2018     Microbiology: Recent Results (from the past 240 hour(s))  Blood culture (routine x 2)     Status: Abnormal (Preliminary result)   Collection Time: 08/31/18  8:30 PM   Specimen: BLOOD  Result Value Ref Range Status   Specimen Description   Final    BLOOD LEFT ANTECUBITAL Performed at [facility], [place] [address] [place], [place] [address]    Special Requests   Final    BOTTLES DRAWN AEROBIC AND ANAEROBIC Blood Culture adequate volume Performed at [facility], [address] [place], [place] [address]    Culture  Setup Time   Final    GRAM POSITIVE COCCI IN CHAINS IN BOTH  AEROBIC AND ANAEROBIC BOTTLES CRITICAL RESULT CALLED TO, READ BACK BY AND VERIFIED WITH: RN AT [facility] S GOUGE [id number] AT 1453 BY CM GRAM POSITIVE RODS CRITICAL RESULT CALLED TO, READ BACK BY AND VERIFIED WITH: PHARMD Y STEENWYK [id number] AT 800 AM BY CM    Culture (A)  Final    GROUP B STREP(S.AGALACTIAE)ISOLATED CULTURE REINCUBATED FOR BETTER GROWTH Performed at [facility], [place] [address] [place], [place] [address]    Report Status PENDING  Incomplete   Organism ID, Bacteria GROUP B STREP(S.AGALACTIAE)ISOLATED  Final      Susceptibility   Group b strep(s.agalactiae)isolated - MIC*    CLINDAMYCIN >=1 RESISTANT Resistant     AMPICILLIN <=0.25 SENSITIVE Sensitive     ERYTHROMYCIN >=8 RESISTANT Resistant     VANCOMYCIN 0.5 SENSITIVE Sensitive     CEFTRIAXONE <=0.12 SENSITIVE Sensitive     LEVOFLOXACIN 0.5 SENSITIVE Sensitive     * GROUP B STREP(S.AGALACTIAE)ISOLATED  Blood Culture ID Panel (Reflexed)     Status: Abnormal   Collection Time: 08/31/18  8:30 PM  Result Value Ref Range Status   Enterococcus species NOT DETECTED NOT DETECTED Final   Listeria monocytogenes NOT DETECTED NOT DETECTED Final   Staphylococcus species NOT DETECTED NOT DETECTED Final   Staphylococcus aureus (BCID) NOT DETECTED NOT DETECTED Final  Streptococcus species DETECTED (A) NOT DETECTED Final    Comment: CRITICAL RESULT CALLED TO, READ BACK BY AND VERIFIED WITH: RN AT [facility] D GOUGE [id number] AT 1453 BY CM    Streptococcus agalactiae DETECTED (A) NOT DETECTED Final    Comment: CRITICAL RESULT CALLED TO, READ BACK BY AND VERIFIED WITH: RN AT [facility] S GOUGE [id number] AT 5956 BY CM    Streptococcus pneumoniae NOT DETECTED NOT DETECTED Final   Streptococcus pyogenes NOT DETECTED NOT DETECTED Final   Acinetobacter baumannii NOT DETECTED NOT DETECTED Final   Enterobacteriaceae species NOT DETECTED NOT DETECTED Final   Enterobacter cloacae complex NOT DETECTED NOT DETECTED Final   Escherichia coli NOT DETECTED NOT  DETECTED Final   Klebsiella oxytoca NOT DETECTED NOT DETECTED Final   Klebsiella pneumoniae NOT DETECTED NOT DETECTED Final   Proteus species NOT DETECTED NOT DETECTED Final   Serratia marcescens NOT DETECTED NOT DETECTED Final   Haemophilus influenzae NOT DETECTED NOT DETECTED Final   Neisseria meningitidis NOT DETECTED NOT DETECTED Final   Pseudomonas aeruginosa NOT DETECTED NOT DETECTED Final   Candida albicans NOT DETECTED NOT DETECTED Final   Candida glabrata NOT DETECTED NOT DETECTED Final   Candida krusei NOT DETECTED NOT DETECTED Final   Candida parapsilosis NOT DETECTED NOT DETECTED Final   Candida tropicalis NOT DETECTED NOT DETECTED Final    Comment: Performed at [facility], [address] [place], [place] [address]  Blood culture (routine x 2)     Status: None (Preliminary result)   Collection Time: 08/31/18  8:40 PM   Specimen: BLOOD RIGHT WRIST  Result Value Ref Range Status   Specimen Description   Final    BLOOD RIGHT WRIST Performed at [facility], [place] [place], [place] [address]    Special Requests   Final    BOTTLES DRAWN AEROBIC AND ANAEROBIC Blood Culture adequate volume Performed at [facility], [address] [place], [place] [address]    Culture   Final    NO GROWTH 4 DAYS Performed at [facility], [place] [address], [place], [place] [address]    Report Status PENDING  Incomplete  Urine culture     Status: Abnormal   Collection Time: 08/31/18  8:40 PM   Specimen: Urine, Random  Result Value Ref Range Status   Specimen Description   Final    URINE, RANDOM Performed at [facility], [address] [address] [place], [place] [address]    Special Requests   Final    NONE Performed at [facility], [place] [place], [place] [address]    Culture >=100,000 COLONIES/mL ESCHERICHIA COLI (A)  Final   Report Status 09/03/2018 FINAL  Final   Organism ID, Bacteria ESCHERICHIA COLI (A)  Final       Susceptibility   Escherichia coli - MIC*    AMPICILLIN <=2 SENSITIVE Sensitive     CEFAZOLIN <=4 SENSITIVE Sensitive     CEFTRIAXONE <=1 SENSITIVE Sensitive     CIPROFLOXACIN >=4 RESISTANT Resistant     GENTAMICIN <=1 SENSITIVE Sensitive     IMIPENEM <=0.25 SENSITIVE Sensitive     NITROFURANTOIN <=16 SENSITIVE Sensitive     TRIMETH/SULFA <=20 SENSITIVE Sensitive     AMPICILLIN/SULBACTAM <=2 SENSITIVE Sensitive     PIP/TAZO <=4 SENSITIVE Sensitive     Extended ESBL NEGATIVE Sensitive     * >=100,000 COLONIES/mL ESCHERICHIA COLI  Blood culture (routine x  2)     Status: None (Preliminary result)   Collection Time: 09/01/18  2:53 PM   Specimen: BLOOD  Result Value Ref Range Status   Specimen Description BLOOD LEFT ANTECUBITAL  Final   Special Requests   Final    BOTTLES DRAWN AEROBIC AND ANAEROBIC Blood Culture results may not be optimal due to an inadequate volume of blood received in culture bottles   Culture   Final    NO GROWTH 3 DAYS Performed at [facility], [place] [address], [place], [place] [address]    Report Status PENDING  Incomplete  Blood culture (routine x 2)     Status: None (Preliminary result)   Collection Time: 09/01/18  2:58 PM   Specimen: BLOOD LEFT FOREARM  Result Value Ref Range Status   Specimen Description BLOOD LEFT FOREARM  Final   Special Requests   Final    BOTTLES DRAWN AEROBIC AND ANAEROBIC Blood Culture adequate volume   Culture   Final    NO GROWTH 3 DAYS Performed at [facility], [place] [address], [place], [place] [address]    Report Status PENDING  Incomplete  SARS Coronavirus 2 (CEPHEID - Performed in [facility]), Hosp Order     Status: None   Collection Time: 09/03/18  3:45 PM   Specimen: Nasopharyngeal Swab  Result Value Ref Range Status   SARS Coronavirus 2 NEGATIVE NEGATIVE Final    Comment: (NOTE) If result is NEGATIVE SARS-CoV-2 target nucleic acids are NOT DETECTED. The SARS-CoV-2 RNA is generally  detectable in upper and lower  respiratory specimens during the acute phase of infection. The lowest  concentration of SARS-CoV-2 viral copies this assay can detect is 250  copies / mL. A negative result does not preclude SARS-CoV-2 infection  and should not be used as the sole basis for treatment or other  patient management decisions.  A negative result may occur with  improper specimen collection / handling, submission of specimen other  than nasopharyngeal swab, presence of viral mutation(s) within the  areas targeted by this assay, and inadequate number of viral copies  (<250 copies / mL). A negative result must be combined with clinical  observations, patient history, and epidemiological information. If result is POSITIVE SARS-CoV-2 target nucleic acids are DETECTED. The SARS-CoV-2 RNA is generally detectable in upper and lower  respiratory specimens dur ing the acute phase of infection.  Positive  results are indicative of active infection with SARS-CoV-2.  Clinical  correlation with patient history and other diagnostic information is  necessary to determine patient infection status.  Positive results do  not rule out bacterial infection or co-infection with other viruses. If result is PRESUMPTIVE POSTIVE SARS-CoV-2 nucleic acids MAY BE PRESENT.   A presumptive positive result was obtained on the submitted specimen  and confirmed on repeat testing.  While 2019 novel coronavirus  (SARS-CoV-2) nucleic acids may be present in the submitted sample  additional confirmatory testing may be necessary for epidemiological  and / or clinical management purposes  to differentiate between  SARS-CoV-2 and other Sarbecovirus currently known to infect humans.  If clinically indicated additional testing with an alternate test  methodology [phone]) is advised. The SARS-CoV-2 RNA is generally  detectable in upper and lower respiratory sp ecimens during the acute  phase of infection. The  expected result is Negative. Fact Sheet for Patients:  StrictlyIdeas.no Fact Sheet for Healthcare Providers: BankingDealers.co.za This test is not yet approved or cleared by the [place]  States FDA and has been authorized for detection and/or diagnosis of SARS-CoV-2 by FDA under an Emergency Use Authorization (EUA).  This EUA will remain in effect (meaning this test can be used) for the duration of the COVID-19 declaration under Section 564(b)(1) of the Act, 21 U.S.C. section 360bbb-3(b)(1), unless the authorization is terminated or revoked sooner. Performed at [facility], [place] [address] [place], [place] [address]     Impression: Ms. Rudell is an 85 yo female with history significant for dual chamber pacemaker placement in 2009 with recent manipulation earlier this month and mitral valve replacement in 2001 who presented with GBS bacteremia. Concern for seeding/source being the pacemaker or prosthetic valve.  Plan:  1. Continue on rocephin, duration of therapy to be determined following TEE assessment 2. Recommend TEE to assess for endocarditis and vegetations on pacemaker leads.

## 2018-09-04 NOTE — Plan of Care (Signed)
  Problem: Education: Goal: Knowledge of General Education information will improve Description: Including pain rating scale, medication(s)/side effects and non-pharmacologic comfort measures Outcome: Progressing   Problem: Health Behavior/Discharge Planning: Goal: Ability to manage health-related needs will improve Outcome: Progressing   Problem: Clinical Measurements: Goal: Ability to maintain clinical measurements within normal limits will improve Outcome: Progressing Goal: Will remain free from infection Outcome: Progressing Goal: Diagnostic test results will improve Outcome: Progressing Goal: Cardiovascular complication will be avoided Outcome: Progressing   Problem: Activity: Goal: Risk for activity intolerance will decrease Outcome: Progressing   Problem: Coping: Goal: Level of anxiety will decrease Outcome: Progressing   Problem: Elimination: Goal: Will not experience complications related to bowel motility Outcome: Progressing Goal: Will not experience complications related to urinary retention Outcome: Progressing   Problem: Safety: Goal: Ability to remain free from injury will improve Outcome: Progressing   Problem: Skin Integrity: Goal: Risk for impaired skin integrity will decrease Outcome: Progressing   Problem: Nutrition: Goal: Adequate nutrition will be maintained Outcome: Completed/Met   Problem: Clinical Measurements: Goal: Respiratory complications will improve Outcome: Not Applicable   Problem: Pain Managment: Goal: General experience of comfort will improve Outcome: Not Applicable

## 2018-09-04 NOTE — Telephone Encounter (Signed)
Post ED Visit - Positive Culture Follow-up  Culture report reviewed by antimicrobial stewardship pharmacist: [place] Team []  Elenor Quinones, Pharm.D. []  Heide Guile, Pharm.D., BCPS AQ-ID []  Parks Neptune, Pharm.D., BCPS []  Alycia Rossetti, Pharm.D., BCPS []  [place], Pharm.D., BCPS, AAHIVP []  Legrand Como, Pharm.D., BCPS, AAHIVP []  Salome Arnt, PharmD, BCPS []  Johnnette Gourd, PharmD, BCPS [x]  Hughes Better, PharmD, BCPS []  Leeroy Cha, PharmD []  Laqueta Linden, PharmD, BCPS []  Albertina Parr, PharmD  [place] Team []  Leodis Sias, PharmD []  Lindell Spar, PharmD []  Royetta Asal, PharmD []  Graylin Shiver, Rph []  Rema Fendt) Glennon Mac, PharmD []  Arlyn Dunning, PharmD []  Netta Cedars, PharmD []  Dia Sitter, PharmD []  Leone Haven, PharmD []  Gretta Arab, PharmD []  Theodis Shove, PharmD []  Peggyann Juba, PharmD []  Reuel Boom, PharmD   Positive urine culture Treated with cephalexin, organism sensitive to the same and no further patient follow-up is required at this time.  Hazle Nordmann 09/04/2018, 10:49 AM

## 2018-09-05 ENCOUNTER — Encounter (HOSPITAL_COMMUNITY)
Admission: EM | Disposition: A | Discharge status: Home Health | Payer: Self-pay | Source: Home / Self Care | Attending: Internal Medicine

## 2018-09-05 ENCOUNTER — Inpatient Hospital Stay (HOSPITAL_COMMUNITY)
Admit: 2018-09-05 | Discharge: 2018-09-05 | Disposition: A | Discharge status: Home / Self Care | Payer: Medicare Other | Attending: Physician Assistant | Admitting: Physician Assistant

## 2018-09-05 ENCOUNTER — Telehealth: Payer: Self-pay | Admitting: Emergency Medicine

## 2018-09-05 ENCOUNTER — Encounter (HOSPITAL_COMMUNITY): Payer: Self-pay

## 2018-09-05 DIAGNOSIS — E785 Hyperlipidemia, unspecified: Secondary | ICD-10-CM

## 2018-09-05 DIAGNOSIS — I342 Nonrheumatic mitral (valve) stenosis: Secondary | ICD-10-CM

## 2018-09-05 HISTORY — PX: TEE WITHOUT CARDIOVERSION: SHX5443

## 2018-09-05 LAB — CULTURE, BLOOD (ROUTINE X 2)
Culture: NO GROWTH
Special Requests: ADEQUATE

## 2018-09-05 LAB — CBC
HCT: 35.8 % — ABNORMAL LOW (ref 36.0–46.0)
Hemoglobin: 11.5 g/dL — ABNORMAL LOW (ref 12.0–15.0)
MCH: 30.8 pg (ref 26.0–34.0)
MCHC: 32.1 g/dL (ref 30.0–36.0)
MCV: 96 fL (ref 80.0–100.0)
Platelets: 97 10*3/uL — ABNORMAL LOW (ref 150–400)
RBC: 3.73 MIL/uL — ABNORMAL LOW (ref 3.87–5.11)
RDW: 13.5 % (ref 11.5–15.5)
WBC: 5.3 10*3/uL (ref 4.0–10.5)
nRBC: 0 % (ref 0.0–0.2)

## 2018-09-05 LAB — BASIC METABOLIC PANEL
Anion gap: 5 (ref 5–15)
BUN: 27 mg/dL — ABNORMAL HIGH (ref 8–23)
CO2: 26 mmol/L (ref 22–32)
Calcium: 10.3 mg/dL (ref 8.9–10.3)
Chloride: 107 mmol/L (ref 98–111)
Creatinine, Ser: 1.17 mg/dL — ABNORMAL HIGH (ref 0.44–1.00)
GFR calc Af Amer: 49 mL/min — ABNORMAL LOW (ref >60)
GFR calc non Af Amer: 42 mL/min — ABNORMAL LOW (ref >60)
Glucose, Bld: 94 mg/dL (ref 70–99)
Potassium: 5.5 mmol/L — ABNORMAL HIGH (ref 3.5–5.1)
Sodium: 138 mmol/L (ref 135–145)

## 2018-09-05 SURGERY — ECHOCARDIOGRAM, TRANSESOPHAGEAL
Anesthesia: Moderate Sedation

## 2018-09-05 MED ORDER — FENTANYL CITRATE (PF) 100 MCG/2ML IJ SOLN
INTRAMUSCULAR | Status: AC
  Filled 2018-09-05: qty 2

## 2018-09-05 MED ORDER — APIXABAN 2.5 MG PO TABS
2.5000 mg | ORAL_TABLET | Freq: Two times a day (BID) | ORAL | Status: DC
  Administered 2018-09-05 – 2018-09-06 (×3): 2.5 mg via ORAL
  Filled 2018-09-05 (×4): qty 1

## 2018-09-05 MED ORDER — SODIUM ZIRCONIUM CYCLOSILICATE 10 G PO PACK
10.0000 g | PACK | Freq: Every day | ORAL | Status: DC
  Administered 2018-09-05 – 2018-09-06 (×2): 10 g via ORAL
  Filled 2018-09-05 (×2): qty 1

## 2018-09-05 MED ORDER — MIDAZOLAM HCL (PF) 10 MG/2ML IJ SOLN
INTRAMUSCULAR | Status: DC | PRN
  Administered 2018-09-05 (×4): 1 mg via INTRAVENOUS

## 2018-09-05 MED ORDER — DIPHENHYDRAMINE HCL 50 MG/ML IJ SOLN
INTRAMUSCULAR | Status: AC
  Filled 2018-09-05: qty 1

## 2018-09-05 MED ORDER — MIDAZOLAM HCL (PF) 5 MG/ML IJ SOLN
INTRAMUSCULAR | Status: AC
  Filled 2018-09-05: qty 2

## 2018-09-05 MED ORDER — BUTAMBEN-TETRACAINE-BENZOCAINE 2-2-14 % EX AERO
INHALATION_SPRAY | CUTANEOUS | Status: DC | PRN
  Administered 2018-09-05: 2 via TOPICAL

## 2018-09-05 MED ORDER — SODIUM CHLORIDE 0.9 % IV SOLN
INTRAVENOUS | Status: DC
  Administered 2018-09-05: 14:00:00 via INTRAVENOUS

## 2018-09-05 MED ORDER — FENTANYL CITRATE (PF) 100 MCG/2ML IJ SOLN
INTRAMUSCULAR | Status: DC | PRN
  Administered 2018-09-05 (×2): 12.5 ug via INTRAVENOUS
  Administered 2018-09-05 (×2): 25 ug via INTRAVENOUS

## 2018-09-05 NOTE — Progress Notes (Signed)
ANTICOAGULATION CONSULT NOTE - Initial Consult  Pharmacy Consult for Apixaban  Indication: Atrial thrombus  Allergies  Allergen Reactions  . Zoloft [Sertraline Hcl]     Patient Measurements: Height: 5\' 3"  (160 cm) Weight: 151 lb (68.5 kg) IBW/kg (Calculated) : 52.4   Vital Signs: Temp: 97.6 F (36.4 C) (07/22 1605) Temp Source: Oral (07/22 1605) BP: 115/60 (07/22 1605) Pulse Rate: 85 (07/22 1605)  Labs: Recent Labs    09/03/18 0317 09/04/18 0339 09/05/18 0329  HGB 12.1 12.0 11.5*  HCT 37.9 38.1 35.8*  PLT 91* 92* 97*  CREATININE 1.14* 1.11* 1.17*    Estimated Creatinine Clearance: 31.4 mL/min (A) (by C-G formula based on SCr of 1.17 mg/dL (H)).   Medical History: Past Medical History:  Diagnosis Date  . Anxiety   . Arthritis    "mostly my hands" (01/29/2015)  . Basal cell carcinoma of forehead   . Benign essential tremor   . CAP (community acquired pneumonia) 01/29/2015  . CHF (congestive heart failure) ([place])   . Depression   . Edema of both legs 04-23-2013  pt states feet swelling (wears ted hose)   bilateral venous ablation procedures done in IR, L>R  . GERD (gastroesophageal reflux disease)   . H/O hiatal hernia   . Hyperlipidemia   . Hyperparathyroidism ([place])    MILD--  STABLE  . Hypertension   . Kidney stones   . Osteopenia   . Permanent atrial fibrillation    CARDIOLOGIST-- DR OFBPZWCH  . Pneumonia "several times"  . Presence of permanent cardiac pacemaker    LAST PACER CHECK  04-23-2013  IN [facility]  . Right ureteral stone   . S/P mitral valve replacement with bioprosthetic valve    04-03-2009  . Schatzki's ring   . Sick sinus syndrome ([place])    S/P  PACEMAKER  2009  . Thrombocytopenia, immune ([place])    CHRONIC---  HEMOTOLOGIST---  DR ENNEVER  . Venous insufficiency, peripheral   . Wears glasses     Medications:  Medications Prior to Admission  Medication Sig Dispense Refill Last Dose  . acetaminophen (TYLENOL) 500 MG tablet Take  500-1,000 mg by mouth daily as needed for moderate pain.   Past Month at Unknown time  . ALPRAZolam (XANAX) 0.5 MG tablet TAKE 1 TABLET BY MOUTH AT BEDTIME (Patient taking differently: Take 0.25-0.5 mg by mouth See admin instructions. Take 0.25mg  daily and 0.5mg  at night) 30 tablet 0 09/01/2018 at Unknown time  . amLODipine-olmesartan (AZOR) 5-20 MG tablet TAKE 1 TABLET BY MOUTH EVERY DAY (Patient taking differently: Take 1 tablet by mouth daily. ) 90 tablet 0 09/01/2018 at Unknown time  . aspirin EC 81 MG tablet Take 81 mg by mouth 3 (three) times a week. Mon, Wed, Fri   08/31/2018 at Unknown time  . B Complex Vitamins (VITAMIN B COMPLEX PO) Take 1 tablet by mouth daily.    09/01/2018 at Unknown time  . cephALEXin (KEFLEX) 500 MG capsule Take 1 capsule (500 mg total) by mouth 3 (three) times daily. 21 capsule 0 09/01/2018 at Unknown time  . cholecalciferol (VITAMIN D3) 25 MCG (1000 UT) tablet Take 1,000 Units by mouth daily.   09/01/2018 at Unknown time  . furosemide (LASIX) 40 MG tablet Take 1 tablet (40 mg total) by mouth daily as needed for fluid or edema. Reported on 01/28/2015 30 tablet  09/01/2018 at Unknown time  . KLOR-CON M20 20 MEQ tablet Take 1 tablet (20 mEq total) by mouth daily as needed (take  with furosemide or lasix). (Patient taking differently: Take 20 mEq by mouth at bedtime. )   08/31/2018 at Unknown time  . omeprazole (PRILOSEC) 20 MG capsule Take 20 mg by mouth 2 (two) times a day.    09/01/2018 at Unknown time  . OVER THE COUNTER MEDICATION Take 1 tablet by mouth 2 (two) times daily. Nutrifuron (immune supplement)   09/01/2018 at Unknown time  . Probiotic CAPS Take 1 capsule by mouth daily.   09/01/2018 at Unknown time  . simvastatin (ZOCOR) 10 MG tablet TAKE 1 TABLET BY MOUTH EVERY DAY AT DINNER (Patient taking differently: Take 10 mg by mouth at bedtime. ) 30 tablet 0 08/31/2018 at Unknown time  . Vit B6-Vit B12-Omega 3 Acids (VITAMIN B PLUS+ PO) Take 1 tablet by mouth daily.   09/01/2018  at Unknown time    Assessment: 85 y.o female.  TEE done today 09/05/18 revealed 2 large, mobile masses in the left atrium, most consistent with thrombus. [facility] consulted for Apixaban for Thrombus ( 2.5mg  BID noted per Dr. Ree Kida in consult order.    Cardioligist, Dr. Debara Pickett noted he discussed with Dr. Sallyanne Kuster and Dr. Ree Kida, recommended anticoagulation with Eliquis 2.5 mg BID- should qualify based on age/weight) and is holding aspirin. H/H 11.5/35.8 slight decrease, pltc 92>97 low, pltc 111k on admit 7/17. h/o CKD stage III, stable,  SCr 1.17 (SCr 1.35 on admit 7/17),  weight 68.5 kg.  I discussed with Dr. Ree Kida that patient may need dose of 5 mg BID, however MD wants lower dose as cardiologist recommended due to low platelets.  Goal of Therapy:  Monitor platelets by anticoagulation protocol: Yes   Plan:  Discontinued Lovenox VTE prophylaxis dose.  Apixaban 2.5 mg BID (as recommended by Cardiologist,  Dr. Debara Pickett and Dr. Ree Kida.   Thank you for allowing pharmacy to be part of this patients care team. Nicole Cella, [place] Pharmacist [phone] Please check AMION for all [place] phone numbers After 10:00 PM, call [place] [phone] 09/05/2018,5:10 PM

## 2018-09-05 NOTE — H&P (Signed)
   INTERVAL PROCEDURE H&P  History and Physical Interval Note:  09/05/2018 2:30 PM  [name] has presented today for their planned procedure. The various methods of treatment have been discussed with the patient and family. After consideration of risks, benefits and other options for treatment, the patient has consented to the procedure.  The patients' outpatient history has been reviewed, patient examined, and no change in status from most recent office note within the past 30 days. I have reviewed the patients' chart and labs and will proceed as planned. Questions were answered to the patient's satisfaction.   Pixie Casino, MD, [facility], [place] Director of the [facility] Lipid Disorders &  Cardiovascular Risk Reduction Clinic Diplomate of the American Board of Clinical Lipidology Attending Cardiologist  Direct Dial: [phone]  Fax: [phone]  Website:  [name] 09/05/2018, 2:30 PM

## 2018-09-05 NOTE — Progress Notes (Signed)
[place] for Infectious Disease   Reason for visit: Follow up on GBS bacteremia  Interval History: Afebrile since admission, WBC WNL, repeat blood cultures from 7/18 negative x3 days. On day 5 of rocephin. TEE scheduled for today at 1:15pm.  Subjective: Patient reports she is doing well. She denies chest pain, SOB, fever, chills, nausea, vomiting.   Physical Exam: Constitutional: Patients appears in NAD Vitals:   09/04/18 2127 09/05/18 0355  BP: (!) 119/58 113/64  Pulse: 61 60  Resp:    Temp: (!) 97.4 F (36.3 C) 97.6 F (36.4 C)  SpO2: 95% 96%  Respiratory: Normal respiratory effort; CTAB Cardiovascular: RRR GI: soft, NT, ND  Review of Systems: Review of Systems  Constitutional: Negative for chills and fever.  Respiratory: Negative for shortness of breath.   Cardiovascular: Negative for chest pain.  Gastrointestinal: Negative for nausea and vomiting.     Lab Results  Component Value Date   WBC 5.3 09/05/2018   HGB 11.5 (L) 09/05/2018   HCT 35.8 (L) 09/05/2018   MCV 96.0 09/05/2018   PLT 97 (L) 09/05/2018    Lab Results  Component Value Date   CREATININE 1.17 (H) 09/05/2018   BUN 27 (H) 09/05/2018   NA 138 09/05/2018   K 5.5 (H) 09/05/2018   CL 107 09/05/2018   CO2 26 09/05/2018    Lab Results  Component Value Date   ALT 11 09/02/2018   AST 20 09/02/2018   ALKPHOS 41 09/02/2018     Microbiology: Recent Results (from the past 240 hour(s))  Blood culture (routine x 2)     Status: Abnormal   Collection Time: 08/31/18  8:30 PM   Specimen: BLOOD  Result Value Ref Range Status   Specimen Description   Final    BLOOD LEFT ANTECUBITAL Performed at [facility], [place] [address], [place], [place] [address]    Special Requests   Final    BOTTLES DRAWN AEROBIC AND ANAEROBIC Blood Culture adequate volume Performed at [facility], [address] [place], [place] [address]    Culture  Setup Time   Final    GRAM POSITIVE COCCI IN  CHAINS IN BOTH AEROBIC AND ANAEROBIC BOTTLES CRITICAL RESULT CALLED TO, READ BACK BY AND VERIFIED WITH: RN AT [facility] S GOUGE [id number] AT 1453 BY CM GRAM POSITIVE RODS CRITICAL RESULT CALLED TO, READ BACK BY AND VERIFIED WITH: PHARMD Y STEENWYK [id number] AT 800 AM BY CM    Culture (A)  Final    GROUP B STREP(S.AGALACTIAE)ISOLATED STAPHYLOCOCCUS SPECIES (COAGULASE NEGATIVE) ACTINOMYCES NEUII Standardized susceptibility testing for this organism is not available. Performed at [facility], [place] [address], [place], [place] [address]    Report Status 09/04/2018 FINAL  Final   Organism ID, Bacteria GROUP B STREP(S.AGALACTIAE)ISOLATED  Final      Susceptibility   Group b strep(s.agalactiae)isolated - MIC*    CLINDAMYCIN >=1 RESISTANT Resistant     AMPICILLIN <=0.25 SENSITIVE Sensitive     ERYTHROMYCIN >=8 RESISTANT Resistant     VANCOMYCIN 0.5 SENSITIVE Sensitive     CEFTRIAXONE <=0.12 SENSITIVE Sensitive     LEVOFLOXACIN 0.5 SENSITIVE Sensitive     * GROUP B STREP(S.AGALACTIAE)ISOLATED  Blood Culture ID Panel (Reflexed)     Status: Abnormal   Collection Time: 08/31/18  8:30 PM  Result Value Ref Range Status   Enterococcus species NOT DETECTED NOT DETECTED Final   Listeria monocytogenes NOT DETECTED NOT DETECTED Final   Staphylococcus  species NOT DETECTED NOT DETECTED Final   Staphylococcus aureus (BCID) NOT DETECTED NOT DETECTED Final   Streptococcus species DETECTED (A) NOT DETECTED Final    Comment: CRITICAL RESULT CALLED TO, READ BACK BY AND VERIFIED WITH: RN AT [facility] D GOUGE [id number] AT 1453 BY CM    Streptococcus agalactiae DETECTED (A) NOT DETECTED Final    Comment: CRITICAL RESULT CALLED TO, READ BACK BY AND VERIFIED WITH: RN AT [facility] S GOUGE [id number] AT 6387 BY CM    Streptococcus pneumoniae NOT DETECTED NOT DETECTED Final   Streptococcus pyogenes NOT DETECTED NOT DETECTED Final   Acinetobacter baumannii NOT DETECTED NOT DETECTED Final   Enterobacteriaceae species NOT DETECTED  NOT DETECTED Final   Enterobacter cloacae complex NOT DETECTED NOT DETECTED Final   Escherichia coli NOT DETECTED NOT DETECTED Final   Klebsiella oxytoca NOT DETECTED NOT DETECTED Final   Klebsiella pneumoniae NOT DETECTED NOT DETECTED Final   Proteus species NOT DETECTED NOT DETECTED Final   Serratia marcescens NOT DETECTED NOT DETECTED Final   Haemophilus influenzae NOT DETECTED NOT DETECTED Final   Neisseria meningitidis NOT DETECTED NOT DETECTED Final   Pseudomonas aeruginosa NOT DETECTED NOT DETECTED Final   Candida albicans NOT DETECTED NOT DETECTED Final   Candida glabrata NOT DETECTED NOT DETECTED Final   Candida krusei NOT DETECTED NOT DETECTED Final   Candida parapsilosis NOT DETECTED NOT DETECTED Final   Candida tropicalis NOT DETECTED NOT DETECTED Final    Comment: Performed at [facility], [address], [place], [place] [address]  Blood culture (routine x 2)     Status: None (Preliminary result)   Collection Time: 08/31/18  8:40 PM   Specimen: BLOOD RIGHT WRIST  Result Value Ref Range Status   Specimen Description   Final    BLOOD RIGHT WRIST Performed at [facility], [address] [place], [place] [address]    Special Requests   Final    BOTTLES DRAWN AEROBIC AND ANAEROBIC Blood Culture adequate volume Performed at [facility], [address] [place], [place] [address]    Culture   Final    NO GROWTH 4 DAYS Performed at [facility], [place] [address], [place], [place] [address]    Report Status PENDING  Incomplete  Urine culture     Status: Abnormal   Collection Time: 08/31/18  8:40 PM   Specimen: Urine, Random  Result Value Ref Range Status   Specimen Description   Final    URINE, RANDOM Performed at [facility], [address] [place], [place] [address]    Special Requests   Final    NONE Performed at [facility], [address] [place], [place] [address]    Culture >=100,000 COLONIES/mL  ESCHERICHIA COLI (A)  Final   Report Status 09/03/2018 FINAL  Final   Organism ID, Bacteria ESCHERICHIA COLI (A)  Final      Susceptibility   Escherichia coli - MIC*    AMPICILLIN <=2 SENSITIVE Sensitive     CEFAZOLIN <=4 SENSITIVE Sensitive     CEFTRIAXONE <=1 SENSITIVE Sensitive     CIPROFLOXACIN >=4 RESISTANT Resistant     GENTAMICIN <=1 SENSITIVE Sensitive     IMIPENEM <=0.25 SENSITIVE Sensitive     NITROFURANTOIN <=16 SENSITIVE Sensitive     TRIMETH/SULFA <=20 SENSITIVE Sensitive     AMPICILLIN/SULBACTAM <=2 SENSITIVE Sensitive     PIP/TAZO <=4 SENSITIVE Sensitive  Extended ESBL NEGATIVE Sensitive     * >=100,000 COLONIES/mL ESCHERICHIA COLI  Blood culture (routine x 2)     Status: None (Preliminary result)   Collection Time: 09/01/18  2:53 PM   Specimen: BLOOD  Result Value Ref Range Status   Specimen Description BLOOD LEFT ANTECUBITAL  Final   Special Requests   Final    BOTTLES DRAWN AEROBIC AND ANAEROBIC Blood Culture results may not be optimal due to an inadequate volume of blood received in culture bottles   Culture   Final    NO GROWTH 3 DAYS Performed at [facility], [place] [address], [place], [place] [address]    Report Status PENDING  Incomplete  Blood culture (routine x 2)     Status: None (Preliminary result)   Collection Time: 09/01/18  2:58 PM   Specimen: BLOOD LEFT FOREARM  Result Value Ref Range Status   Specimen Description BLOOD LEFT FOREARM  Final   Special Requests   Final    BOTTLES DRAWN AEROBIC AND ANAEROBIC Blood Culture adequate volume   Culture   Final    NO GROWTH 3 DAYS Performed at [facility], [place] [address], [place], [place] [address]    Report Status PENDING  Incomplete  SARS Coronavirus 2 (CEPHEID - Performed in [place] hospital lab), Hosp Order     Status: None   Collection Time: 09/03/18  3:45 PM   Specimen: Nasopharyngeal Swab  Result Value Ref Range Status   SARS Coronavirus 2 NEGATIVE NEGATIVE Final     Comment: (NOTE) If result is NEGATIVE SARS-CoV-2 target nucleic acids are NOT DETECTED. The SARS-CoV-2 RNA is generally detectable in upper and lower  respiratory specimens during the acute phase of infection. The lowest  concentration of SARS-CoV-2 viral copies this assay can detect is 250  copies / mL. A negative result does not preclude SARS-CoV-2 infection  and should not be used as the sole basis for treatment or other  patient management decisions.  A negative result may occur with  improper specimen collection / handling, submission of specimen other  than nasopharyngeal swab, presence of viral mutation(s) within the  areas targeted by this assay, and inadequate number of viral copies  (<250 copies / mL). A negative result must be combined with clinical  observations, patient history, and epidemiological information. If result is POSITIVE SARS-CoV-2 target nucleic acids are DETECTED. The SARS-CoV-2 RNA is generally detectable in upper and lower  respiratory specimens dur ing the acute phase of infection.  Positive  results are indicative of active infection with SARS-CoV-2.  Clinical  correlation with patient history and other diagnostic information is  necessary to determine patient infection status.  Positive results do  not rule out bacterial infection or co-infection with other viruses. If result is PRESUMPTIVE POSTIVE SARS-CoV-2 nucleic acids MAY BE PRESENT.   A presumptive positive result was obtained on the submitted specimen  and confirmed on repeat testing.  While 2019 novel coronavirus  (SARS-CoV-2) nucleic acids may be present in the submitted sample  additional confirmatory testing may be necessary for epidemiological  and / or clinical management purposes  to differentiate between  SARS-CoV-2 and other Sarbecovirus currently known to infect humans.  If clinically indicated additional testing with an alternate test  methodology [phone]) is advised. The SARS-CoV-2  RNA is generally  detectable in upper and lower respiratory sp ecimens during the acute  phase of infection. The expected result is Negative. Fact Sheet for Patients:  StrictlyIdeas.no Fact Sheet for Healthcare Providers: BankingDealers.co.za This test is not yet approved or cleared by the [place] FDA and has been authorized for detection and/or diagnosis of SARS-CoV-2 by FDA under an Emergency Use Authorization (EUA).  This EUA will remain in effect (meaning this test can be used) for the duration of the COVID-19 declaration under Section 564(b)(1) of the Act, 21 U.S.C. section 360bbb-3(b)(1), unless the authorization is terminated or revoked sooner. Performed at [facility], [place] [address], [place],  [address]     Impression: [name] is an 85 yo female with history significant for dual chamber pacemaker placement in 2009 with recent manipulation earlier this month and mitral valve replacement in 2001 who presented with GBS bacteremia. Concern for seeding/source being the pacemaker or prosthetic valve.   Plan:  1. Continue on rocephin, duration of therapy to be determined following TEE assessment today. Will likely require 4 weeks of IV abx therapy. 2. Recommend holding off on PICC placement until after TEE evaluation  Jeanmarie Hubert, MD, PGY1 09/05/2018, 11:48 AM

## 2018-09-05 NOTE — Progress Notes (Signed)
Update given to daughter [name]) to let her know that the TEE is scheduled for 2:15 or earlier and PICC orders have been placed. Will continue to update as needed. Cathlean Marseilles Tamala Julian, MSN, RN, [place], AGCNS

## 2018-09-05 NOTE — Discharge Instructions (Signed)

## 2018-09-05 NOTE — CV Procedure (Signed)
TRANSESOPHAGEAL ECHOCARDIOGRAM (TEE) NOTE  INDICATIONS: infective endocarditis  PROCEDURE:   Informed consent was obtained prior to the procedure. The risks, benefits and alternatives for the procedure were discussed and the patient comprehended these risks.  Risks include, but are not limited to, cough, sore throat, vomiting, nausea, somnolence, esophageal and stomach trauma or perforation, bleeding, low blood pressure, aspiration, pneumonia, infection, trauma to the teeth and death.    After a procedural time-out, the patient was given 4 mg versed and 75 mcg fentanyl for moderate sedation.  The patient's heart rate, blood pressure, and oxygen saturation are monitored continuously during the procedure.The oropharynx was anesthetized with 2 topical cetacaine sprays.  The transesophageal probe was inserted in the esophagus and stomach without difficulty and multiple views were obtained.  The patient left the procedure room in stable condition.   Agitated microbubble saline contrast was not administered.  COMPLICATIONS:    There were no immediate complications.  Findings:  1. LEFT VENTRICLE: The left ventricular wall thickness is mildly increased.  The left ventricular cavity is normal in size. Wall motion is normal.  LVEF is 60-65%.  2. RIGHT VENTRICLE:  The right ventricle is normal in structure and function without any thrombus or masses.  Pacer leads are noted without thrombus or vegetation.  3. LEFT ATRIUM:  The left atrium is severely dilated in size. There are 2 large, mobile masses measuring 2-3 cm in diameter -adherent to the posterior septum and area of the coumadin ridge. There is spontaneous echo contrast ("smoke") in the left atrium consistent with a low flow state.   4. LEFT ATRIAL APPENDAGE:  The left atrial appendage is surgically ligated.  5. ATRIAL SEPTUM:  The atrial septum appears intact and is free of thrombus and/or masses.  There is no evidence for interatrial  shunting by color doppler.  6. RIGHT ATRIUM:  The right atrium is mildly dilated in size and function without any thrombus or masses. Pacer leads are noted without thrombus or vegetation.  7. MITRAL VALVE:  The mitral valve is a bioprosthetic and is normally functioning. There is moderate mitral stenosis - mean gradient 9 mmHg with trivial regurgitation. No vegetations were noted.  8. AORTIC VALVE:  The aortic valve is trileaflet with moderate sclerosis and Mild regurgitation.  There were no vegetations or stenosis  9. TRICUSPID VALVE:  The tricuspid valve is normal in structure and function with Moderate regurgitation.  There were no vegetations or stenosis  10.  PULMONIC VALVE:  The pulmonic valve is normal in structure and function with trivial regurgitation.  There were no vegetations or stenosis.   11. AORTIC ARCH, ASCENDING AND DESCENDING AORTA:  There was grade 2 Ron Parker et. Al, 1992) atherosclerosis of the ascending aorta, aortic arch, or proximal descending aorta.  12. PULMONARY VEINS: Anomalous pulmonary venous return was not noted.  13. PERICARDIUM: The pericardium appeared normal and non-thickened.  There is no pericardial effusion.  IMPRESSION:   1. No evidence for endocarditis. 2. 2 large, mobile masses in the left atrium, most consistent with thrombus 3. Severe LAE 4. Surgically ligated left atrial appendage 5. Bioprosthetic mitral valve with moderate stenosis (mean gradient 9 mmHg), trivial MR, no vegetation and otherwise normal function 6. Right heart pacer leads noted without thrombus or vegetation 7. Aortic sclerosis with mild AI 8. Moderate TR 9. LVEF 60-65%  RECOMMENDATIONS:    1. D/w Dr. Sallyanne Kuster and Dr. Ree Kida, recommend anticoagulation with Eliquis (2.5 mg BID - should qualify based on age/weight). Can  hold aspirin. 2. Per Dr. Sallyanne Kuster, she will need pacer re-programming which will be performed by the device rep later this evening. He will see the patient  tomorrow.  Time Spent Directly with the Patient:  60 minutes   Pixie Casino, MD, [facility], [place] Director of the Advanced Lipid Disorders &  Cardiovascular Risk Reduction Clinic Diplomate of the American Board of Clinical Lipidology Attending Cardiologist  Direct Dial: [phone]  Fax: [phone]  Website:  [email or web address] 09/05/2018, 3:34 PM

## 2018-09-05 NOTE — Plan of Care (Signed)
  Problem: Education: Goal: Knowledge of General Education information will improve Description: Including pain rating scale, medication(s)/side effects and non-pharmacologic comfort measures Outcome: Progressing   Problem: Health Behavior/Discharge Planning: Goal: Ability to manage health-related needs will improve Outcome: Progressing   Problem: Clinical Measurements: Goal: Ability to maintain clinical measurements within normal limits will improve Outcome: Progressing Goal: Will remain free from infection Outcome: Progressing Goal: Diagnostic test results will improve Outcome: Progressing Goal: Cardiovascular complication will be avoided Outcome: Progressing   Problem: Activity: Goal: Risk for activity intolerance will decrease Outcome: Progressing   Problem: Coping: Goal: Level of anxiety will decrease Outcome: Progressing   Problem: Elimination: Goal: Will not experience complications related to bowel motility Outcome: Progressing Goal: Will not experience complications related to urinary retention Outcome: Progressing   Problem: Safety: Goal: Ability to remain free from injury will improve Outcome: Progressing   Problem: Skin Integrity: Goal: Risk for impaired skin integrity will decrease Outcome: Progressing   

## 2018-09-05 NOTE — Care Management Important Message (Signed)
Important Message  Patient Details  Name: [name] MRN: [id number] Date of Birth: 05/28/1929   Medicare Important Message Given:  Yes     Onyekachi Gathright 09/05/2018, 9:30 AM

## 2018-09-05 NOTE — Progress Notes (Signed)
Physical Therapy Treatment Patient Details Name: [name] MRN: [id number] DOB: 1930/01/30 Today's Date: 09/05/2018    History of Present Illness 85 y/o female admitted secondary to UTI and batermia infection. PMH includes a fib, pacemaker placement, dCHF, CKD, and HTN.     PT Comments    On arrival to room, pt was sitting on urine soaked sheets. Assisted pt to [facility] for toileting and gown change. Pt was independent with pericare while seated. She progressed to ambulation in hallway. Pt is progressing well towards goals. Current plan remains appropriate.     Follow Up Recommendations  Home health PT;Supervision for mobility/OOB     Equipment Recommendations  None recommended by PT    Recommendations for Other Services       Precautions / Restrictions Precautions Precautions: Fall Precaution Comments: urine incontinence Restrictions Weight Bearing Restrictions: No    Mobility  Bed Mobility Overal bed mobility: Needs Assistance Bed Mobility: Supine to Sit     Supine to sit: Min guard     General bed mobility comments: Increased time and effort  Transfers Overall transfer level: Needs assistance Equipment used: Rolling walker (2 wheeled) Transfers: Sit to/from [facility] Sit to Stand: Min guard Stand pivot transfers: Min guard       General transfer comment: Min guard for safety to rise from EOB and BSC. VC for hand placement  Ambulation/Gait Ambulation/Gait assistance: Min guard(for safety) Gait Distance (Feet): 120 Feet Assistive device: Rolling walker (2 wheeled) Gait Pattern/deviations: Step-through pattern;Decreased stride length;Wide base of support Gait velocity: reduced   General Gait Details: VC for RW proximity. Pt makes wide turns but is comfortable ambulating with RW.   Stairs             Wheelchair Mobility    Modified Rankin (Stroke Patients Only)       Balance Overall balance assessment: Needs  assistance Sitting-balance support: No upper extremity supported;Feet supported Sitting balance-Leahy Scale: Good     Standing balance support: Bilateral upper extremity supported;During functional activity Standing balance-Leahy Scale: Fair Standing balance comment: less than fair with dynamic balance                            Cognition Arousal/Alertness: Awake/alert Behavior During Therapy: WFL for tasks assessed/performed Overall Cognitive Status: No family/caregiver present to determine baseline cognitive functioning                                 General Comments: has mild confusion about some details      Exercises      General Comments General comments (skin integrity, edema, etc.): Pt with tremmors in hands. She reports this is her baseline.      Pertinent Vitals/Pain Pain Assessment: No/denies pain    Home Living                      Prior Function            PT Goals (current goals can now be found in the care plan section) Acute Rehab PT Goals Patient Stated Goal: to go home PT Goal Formulation: With patient Time For Goal Achievement: 09/17/18 Potential to Achieve Goals: Good Progress towards PT goals: Progressing toward goals    Frequency    Min 3X/week      PT Plan Current plan remains appropriate    Co-evaluation  AM-PAC PT "6 Clicks" Mobility   Outcome Measure  Help needed turning from your back to your side while in a flat bed without using bedrails?: None Help needed moving from lying on your back to sitting on the side of a flat bed without using bedrails?: A Little Help needed moving to and from a bed to a chair (including a wheelchair)?: A Little Help needed standing up from a chair using your arms (e.g., wheelchair or bedside chair)?: A Little Help needed to walk in hospital room?: A Little Help needed climbing 3-5 steps with a railing? : A Lot 6 Click Score: 18    End of  Session Equipment Utilized During Treatment: Gait belt Activity Tolerance: Patient tolerated treatment well Patient left: in chair;with call bell/phone within reach;with chair alarm set Nurse Communication: Mobility status PT Visit Diagnosis: Unsteadiness on feet (R26.81);Muscle weakness (generalized) (M62.81)     Time: 8022-3361 PT Time Calculation (min) (ACUTE ONLY): 19 min  Charges:  $Gait Training: 8-22 mins                     Benjiman Core, [place] Pager [phone] Acute Rehab  Allena Katz 09/05/2018, 2:10 PM

## 2018-09-05 NOTE — Telephone Encounter (Signed)
Post ED Visit - Positive Culture Follow-up  Culture report reviewed by antimicrobial stewardship pharmacist: [place] Team []  Elenor Quinones, Pharm.D. []  Heide Guile, Pharm.D., BCPS AQ-ID []  Parks Neptune, Pharm.D., BCPS []  Alycia Rossetti, Pharm.D., BCPS []  Dickson, [place].D., BCPS, AAHIVP []  Legrand Como, Pharm.D., BCPS, AAHIVP []  Salome Arnt, PharmD, BCPS []  Johnnette Gourd, PharmD, BCPS []  Hughes Better, PharmD, BCPS []  Leeroy Cha, PharmD []  Laqueta Linden, PharmD, BCPS []  Albertina Parr, PharmD Elicia Lamp PharmD  [place] Team []  Leodis Sias, PharmD []  Lindell Spar, PharmD []  Royetta Asal, PharmD []  Graylin Shiver, Rph []  Rema Fendt) Glennon Mac, PharmD []  Arlyn Dunning, PharmD []  Netta Cedars, PharmD []  Dia Sitter, PharmD []  Leone Haven, PharmD []  Gretta Arab, PharmD []  Theodis Shove, PharmD []  Peggyann Juba, PharmD []  Reuel Boom, PharmD   Positive blood culture Current inpatient @ [facility] receiving treatment,no further patient follow-up is required at this time.  Hazle Nordmann 09/05/2018, 11:54 AM

## 2018-09-05 NOTE — Progress Notes (Signed)
  Echocardiogram 2D Echocardiogram has been performed.  [name] 09/05/2018, 3:46 PM

## 2018-09-05 NOTE — Progress Notes (Signed)
    [facility] has been requested to perform a transesophageal echocardiogram on [name] for bacteremia.  After careful review of history and examination, the risks and benefits of transesophageal echocardiogram have been explained including risks of esophageal damage, perforation (1:10,000 risk), bleeding, pharyngeal hematoma as well as other potential complications associated with conscious sedation including aspiration, arrhythmia, respiratory failure and death. Alternatives to treatment were discussed, questions were answered. Patient is willing to proceed.   Pt is scheduled for TEE today at 1:15pm with Dr. Debara Pickett.   [place], [place]  09/05/2018 8:47 AM

## 2018-09-05 NOTE — Progress Notes (Addendum)
PROGRESS NOTE    [name]  TKP:[id number] DOB: 1929-03-19 DOA: 09/01/2018 PCP: Lawerance Cruel, MD   Brief Narrative:  HPI on 09/01/2018 by Dr. Gala Romney [name] is a 85 y.o. female with medical history significant of essential tremors, osteoarthritis, basal cell carcinoma, diastolic CHF, community-acquired pneumonia who was seen in the ER yesterday and diagnosed with UTI.  Patient was discharged home on cephalexin.  She had blood and urine cultures collected.  Blood cultures came back positive for Streptococcus.  Patient was called and asked to come back to the ER today and was admitted for IV antibiotics.  She denied any fever today.  No nausea vomiting or diarrhea.  Patient has history of dual-chamber pacemaker placement..  Interim history Patient was noted to have positive strep blood cultures which he presented to the emergency department for urinary tract infection.  She was called back to the ER and was admitted for IV antibiotics.  Infectious disease consulted.  Given that patient has a pacemaker, they recommended TEE.  Assessment & Plan   Streptococcal agalactiae bacteremia -Blood cultures on 08/31/2018 were positive -Blood cultures from 09/01/2018 negative to date -Patient was placed on IV Rocephin -Infectious disease consulted and appreciated -Chest x-ray unremarkable for acute infection -Patient afebrile with no leukocytosis -Cardiology consulted for TEE to rule out vegetation given the patient has pacemaker in place  E. coli UTI -Urine culture from 08/31/2018 showed E. coli which was resistant to Cipro but sensitive to other antibiotics -Continue ceftriaxone  History of atrial fibrillation -Paroxysmal -Currently in sinus rhythm.  Not on any anticoagulation  Hyperlipidemia -Continue statin  Essential hypertension -BP stable, continue amlodipine, Lasix  Chronic diastolic heart failure -Currently compensated and euvolemic -Continue Lasix  -Monitor intake and output, daily weights  Chronic kidney disease, stage III -Stable, continue to monitor BMP  GERD -Continue PPI  Essential tremors -Chronic and at baseline  Hyperkalemia -will place on lokelma -discontinued supplemental potassium -continue to monitor BMP  DVT Prophylaxis  Lovenox  Code Status: Full  Family Communication: None at bedside  Disposition Plan: Admitted. Pending TEE. Home when stable  Consultants Infectious disease  Procedures  None  Antibiotics   Anti-infectives (From admission, onward)   Start     Dose/Rate Route Frequency Ordered Stop   09/01/18 2100  [MAR Hold]  cefTRIAXone (ROCEPHIN) 2 g in sodium chloride 0.9 % 100 mL IVPB     (MAR Hold since Wed 09/05/2018 at 1353.Hold Reason: Transfer to a Procedural area.)   2 g 200 mL/hr over 30 Minutes Intravenous Every 24 hours 09/01/18 2055     09/01/18 1900  cefTRIAXone (ROCEPHIN) 1 g in sodium chloride 0.9 % 100 mL IVPB  Status:  Discontinued     1 g 200 mL/hr over 30 Minutes Intravenous  Once 09/01/18 1845 09/01/18 2101      Subjective:   [name] seen and examined today.  Wondering when her TEE will be done.  Denies current chest pain, shortness of breath, abdominal pain, nausea or vomiting, diarrhea constipation, dizziness or headache.  Objective:   Vitals:   09/04/18 1329 09/04/18 2127 09/05/18 0355 09/05/18 1353  BP: 130/86 (!) 119/58 113/64 (!) 155/65  Pulse: 72 61 60 81  Resp: 17   19  Temp: 98.1 F (36.7 C) (!) 97.4 F (36.3 C) 97.6 F (36.4 C) 97.8 F (36.6 C)  TempSrc: Oral Oral Oral Oral  SpO2: 91% 95% 96% 96%    Intake/Output Summary (Last 24  hours) at 09/05/2018 1404 Last data filed at 09/05/2018 1315 Gross per 24 hour  Intake 340 ml  Output 1300 ml  Net -960 ml   There were no vitals filed for this visit.  Exam  General: Well developed, well nourished, elderly, NAD, appears stated age  56: NCAT, mucous membranes moist.   Cardiovascular: S1 S2  auscultated, RRR, no murmur  Respiratory: Diminished breath sounds, however clear  Abdomen: Soft, nontender, nondistended, + bowel sounds  Extremities: warm dry without cyanosis clubbing or edema  Neuro: AAOx3, nonfocal  Psych: Appropriate mood and affect, pleasant   Data Reviewed: I have personally reviewed following labs and imaging studies  CBC: Recent Labs  Lab 09/01/18 1700 09/01/18 2335 09/02/18 0400 09/03/18 0317 09/04/18 0339 09/05/18 0329  WBC 5.7 5.7 5.3 5.2 5.4 5.3  NEUTROABS 3.3  --   --   --   --   --   HGB 13.3 11.9* 12.1 12.1 12.0 11.5*  HCT 42.4 37.5 37.9 37.9 38.1 35.8*  MCV 97.9 97.2 97.4 95.7 97.9 96.0  PLT 108* 94* 94* 91* 92* 97*   Basic Metabolic Panel: Recent Labs  Lab 09/01/18 1700 09/01/18 2335 09/02/18 0400 09/03/18 0317 09/04/18 0339 09/05/18 0329  NA 141  --  141 139 141 138  K 4.2  --  4.7 5.0 5.0 5.5*  CL 106  --  106 106 108 107  CO2 26  --  27 26 27 26   GLUCOSE 117*  --  89 88 92 94  BUN 20  --  19 20 22  27*  CREATININE 1.44* 1.26* 1.17* 1.14* 1.11* 1.17*  CALCIUM 11.0*  --  10.4* 10.3 10.3 10.3   GFR: CrCl cannot be calculated (Unknown ideal weight.). Liver Function Tests: Recent Labs  Lab 08/31/18 1955 09/01/18 1700 09/02/18 0400  AST 25 23 20   ALT 15 13 11   ALKPHOS 54 51 41  BILITOT 1.0 0.8 0.6  PROT 6.6 6.0* 5.1*  ALBUMIN 4.1 3.8 3.1*   No results for input(s): LIPASE, AMYLASE in the last 168 hours. No results for input(s): AMMONIA in the last 168 hours. Coagulation Profile: No results for input(s): INR, PROTIME in the last 168 hours. Cardiac Enzymes: No results for input(s): CKTOTAL, CKMB, CKMBINDEX, TROPONINI in the last 168 hours. BNP (last 3 results) No results for input(s): PROBNP in the last 8760 hours. HbA1C: No results for input(s): HGBA1C in the last 72 hours. CBG: No results for input(s): GLUCAP in the last 168 hours. Lipid Profile: No results for input(s): CHOL, HDL, LDLCALC, TRIG, CHOLHDL,  LDLDIRECT in the last 72 hours. Thyroid Function Tests: No results for input(s): TSH, T4TOTAL, FREET4, T3FREE, THYROIDAB in the last 72 hours. Anemia Panel: No results for input(s): VITAMINB12, FOLATE, FERRITIN, TIBC, IRON, RETICCTPCT in the last 72 hours. Urine analysis:    Component Value Date/Time   COLORURINE YELLOW 08/31/2018 2040   APPEARANCEUR CLOUDY (A) 08/31/2018 2040   LABSPEC 1.015 08/31/2018 2040   PHURINE 6.0 08/31/2018 2040   GLUCOSEU NEGATIVE 08/31/2018 2040   HGBUR SMALL (A) 08/31/2018 2040   BILIRUBINUR NEGATIVE 08/31/2018 2040   KETONESUR NEGATIVE 08/31/2018 2040   PROTEINUR NEGATIVE 08/31/2018 2040   UROBILINOGEN 0.2 04/17/2009 1759   NITRITE NEGATIVE 08/31/2018 2040   LEUKOCYTESUR SMALL (A) 08/31/2018 2040   Sepsis Labs: @LABRCNTIP (procalcitonin:4,lacticidven:4)  ) Recent Results (from the past 240 hour(s))  Blood culture (routine x 2)     Status: Abnormal   Collection Time: 08/31/18  8:30 PM   Specimen:  BLOOD  Result Value Ref Range Status   Specimen Description   Final    BLOOD LEFT ANTECUBITAL Performed at [facility], [place] [address], [place], [place] [address]    Special Requests   Final    BOTTLES DRAWN AEROBIC AND ANAEROBIC Blood Culture adequate volume Performed at [facility], [address] [place], [place] [address]    Culture  Setup Time   Final    GRAM POSITIVE COCCI IN CHAINS IN BOTH AEROBIC AND ANAEROBIC BOTTLES CRITICAL RESULT CALLED TO, READ BACK BY AND VERIFIED WITH: RN AT [facility] S GOUGE [id number] AT 1453 BY CM GRAM POSITIVE RODS CRITICAL RESULT CALLED TO, READ BACK BY AND VERIFIED WITH: PHARMD Y STEENWYK [id number] AT 800 AM BY CM    Culture (A)  Final    GROUP B STREP(S.AGALACTIAE)ISOLATED STAPHYLOCOCCUS SPECIES (COAGULASE NEGATIVE) ACTINOMYCES NEUII Standardized susceptibility testing for this organism is not available. Performed at [facility], [place] [address], [place], [place] [address]    Report Status  09/04/2018 FINAL  Final   Organism ID, Bacteria GROUP B STREP(S.AGALACTIAE)ISOLATED  Final      Susceptibility   Group b strep(s.agalactiae)isolated - MIC*    CLINDAMYCIN >=1 RESISTANT Resistant     AMPICILLIN <=0.25 SENSITIVE Sensitive     ERYTHROMYCIN >=8 RESISTANT Resistant     VANCOMYCIN 0.5 SENSITIVE Sensitive     CEFTRIAXONE <=0.12 SENSITIVE Sensitive     LEVOFLOXACIN 0.5 SENSITIVE Sensitive     * GROUP B STREP(S.AGALACTIAE)ISOLATED  Blood Culture ID Panel (Reflexed)     Status: Abnormal   Collection Time: 08/31/18  8:30 PM  Result Value Ref Range Status   Enterococcus species NOT DETECTED NOT DETECTED Final   Listeria monocytogenes NOT DETECTED NOT DETECTED Final   Staphylococcus species NOT DETECTED NOT DETECTED Final   Staphylococcus aureus (BCID) NOT DETECTED NOT DETECTED Final   Streptococcus species DETECTED (A) NOT DETECTED Final    Comment: CRITICAL RESULT CALLED TO, READ BACK BY AND VERIFIED WITH: RN AT [facility] D GOUGE [id number] AT 1453 BY CM    Streptococcus agalactiae DETECTED (A) NOT DETECTED Final    Comment: CRITICAL RESULT CALLED TO, READ BACK BY AND VERIFIED WITH: RN AT [facility] S GOUGE [id number] AT 4403 BY CM    Streptococcus pneumoniae NOT DETECTED NOT DETECTED Final   Streptococcus pyogenes NOT DETECTED NOT DETECTED Final   Acinetobacter baumannii NOT DETECTED NOT DETECTED Final   Enterobacteriaceae species NOT DETECTED NOT DETECTED Final   Enterobacter cloacae complex NOT DETECTED NOT DETECTED Final   Escherichia coli NOT DETECTED NOT DETECTED Final   Klebsiella oxytoca NOT DETECTED NOT DETECTED Final   Klebsiella pneumoniae NOT DETECTED NOT DETECTED Final   Proteus species NOT DETECTED NOT DETECTED Final   Serratia marcescens NOT DETECTED NOT DETECTED Final   Haemophilus influenzae NOT DETECTED NOT DETECTED Final   Neisseria meningitidis NOT DETECTED NOT DETECTED Final   Pseudomonas aeruginosa NOT DETECTED NOT DETECTED Final   Candida albicans NOT DETECTED NOT  DETECTED Final   Candida glabrata NOT DETECTED NOT DETECTED Final   Candida krusei NOT DETECTED NOT DETECTED Final   Candida parapsilosis NOT DETECTED NOT DETECTED Final   Candida tropicalis NOT DETECTED NOT DETECTED Final    Comment: Performed at [facility], [place] [address] [place],  [address]  Blood culture (routine x 2)     Status: None   Collection Time: 08/31/18  8:40 PM   Specimen: BLOOD RIGHT WRIST  Result Value Ref Range Status   Specimen Description   Final    BLOOD RIGHT WRIST Performed at [facility], [address] [place], [place] [address]    Special Requests   Final    BOTTLES DRAWN AEROBIC AND ANAEROBIC Blood Culture adequate volume Performed at [facility], [address] [place], [place] [address]    Culture   Final    NO GROWTH 5 DAYS Performed at [facility], [place] [address] [place], [place] [address]    Report Status 09/05/2018 FINAL  Final  Urine culture     Status: Abnormal   Collection Time: 08/31/18  8:40 PM   Specimen: Urine, Random  Result Value Ref Range Status   Specimen Description   Final    URINE, RANDOM Performed at [facility], [place] [place], [place] [address]    Special Requests   Final    NONE Performed at [facility], [address] [place], [place] [address]    Culture >=100,000 COLONIES/mL ESCHERICHIA COLI (A)  Final   Report Status 09/03/2018 FINAL  Final   Organism ID, Bacteria ESCHERICHIA COLI (A)  Final      Susceptibility   Escherichia coli - MIC*    AMPICILLIN <=2 SENSITIVE Sensitive     CEFAZOLIN <=4 SENSITIVE Sensitive     CEFTRIAXONE <=1 SENSITIVE Sensitive     CIPROFLOXACIN >=4 RESISTANT Resistant     GENTAMICIN <=1 SENSITIVE Sensitive     IMIPENEM <=0.25 SENSITIVE Sensitive     NITROFURANTOIN <=16 SENSITIVE Sensitive     TRIMETH/SULFA <=20 SENSITIVE Sensitive     AMPICILLIN/SULBACTAM <=2 SENSITIVE Sensitive     PIP/TAZO <=4 SENSITIVE  Sensitive     Extended ESBL NEGATIVE Sensitive     * >=100,000 COLONIES/mL ESCHERICHIA COLI  Blood culture (routine x 2)     Status: None (Preliminary result)   Collection Time: 09/01/18  2:53 PM   Specimen: BLOOD  Result Value Ref Range Status   Specimen Description BLOOD LEFT ANTECUBITAL  Final   Special Requests   Final    BOTTLES DRAWN AEROBIC AND ANAEROBIC Blood Culture results may not be optimal due to an inadequate volume of blood received in culture bottles   Culture   Final    NO GROWTH 4 DAYS Performed at [facility], [address] [place], [place] [address]    Report Status PENDING  Incomplete  Blood culture (routine x 2)     Status: None (Preliminary result)   Collection Time: 09/01/18  2:58 PM   Specimen: BLOOD LEFT FOREARM  Result Value Ref Range Status   Specimen Description BLOOD LEFT FOREARM  Final   Special Requests   Final    BOTTLES DRAWN AEROBIC AND ANAEROBIC Blood Culture adequate volume   Culture   Final    NO GROWTH 4 DAYS Performed at [facility], [place] [address] [place], [place] [address]    Report Status PENDING  Incomplete  SARS Coronavirus 2 (CEPHEID - Performed in [place] [facility] lab), Hosp Order     Status: None   Collection Time: 09/03/18  3:45 PM   Specimen: Nasopharyngeal Swab  Result Value Ref Range Status   SARS Coronavirus 2 NEGATIVE NEGATIVE Final    Comment: (NOTE) If result is NEGATIVE SARS-CoV-2 target nucleic acids are NOT DETECTED. The SARS-CoV-2 RNA is generally detectable in upper and lower  respiratory specimens  during the acute phase of infection. The lowest  concentration of SARS-CoV-2 viral copies this assay can detect is 250  copies / mL. A negative result does not preclude SARS-CoV-2 infection  and should not be used as the sole basis for treatment or other  patient management decisions.  A negative result may occur with  improper specimen collection / handling, submission of specimen other  than  nasopharyngeal swab, presence of viral mutation(s) within the  areas targeted by this assay, and inadequate number of viral copies  (<250 copies / mL). A negative result must be combined with clinical  observations, patient history, and epidemiological information. If result is POSITIVE SARS-CoV-2 target nucleic acids are DETECTED. The SARS-CoV-2 RNA is generally detectable in upper and lower  respiratory specimens dur ing the acute phase of infection.  Positive  results are indicative of active infection with SARS-CoV-2.  Clinical  correlation with patient history and other diagnostic information is  necessary to determine patient infection status.  Positive results do  not rule out bacterial infection or co-infection with other viruses. If result is PRESUMPTIVE POSTIVE SARS-CoV-2 nucleic acids MAY BE PRESENT.   A presumptive positive result was obtained on the submitted specimen  and confirmed on repeat testing.  While 2019 novel coronavirus  (SARS-CoV-2) nucleic acids may be present in the submitted sample  additional confirmatory testing may be necessary for epidemiological  and / or clinical management purposes  to differentiate between  SARS-CoV-2 and other Sarbecovirus currently known to infect humans.  If clinically indicated additional testing with an alternate test  methodology [id number]) is advised. The SARS-CoV-2 RNA is generally  detectable in upper and lower respiratory sp ecimens during the acute  phase of infection. The expected result is Negative. Fact Sheet for Patients:  StrictlyIdeas.no Fact Sheet for Healthcare Providers: BankingDealers.co.za This test is not yet approved or cleared by the [place] FDA and has been authorized for detection and/or diagnosis of SARS-CoV-2 by FDA under an Emergency Use Authorization (EUA).  This EUA will remain in effect (meaning this test can be used) for the duration of the  COVID-19 declaration under Section 564(b)(1) of the Act, 21 U.S.C. section 360bbb-3(b)(1), unless the authorization is terminated or revoked sooner. Performed at [facility], [place] [address], [place], [place] [address]       Radiology Studies: No results found.   Scheduled Meds: . [MAR Hold] acidophilus  1 capsule Oral Daily  . [MAR Hold] ALPRAZolam  0.25 mg Oral Daily   And  . [MAR Hold] ALPRAZolam  0.5 mg Oral QHS  . [MAR Hold] amLODipine  5 mg Oral Daily   And  . [MAR Hold] irbesartan  150 mg Oral Daily  . [MAR Hold] aspirin EC  81 mg Oral Q M,W,F  . [MAR Hold] B-complex with vitamin C  1 tablet Oral Daily  . [MAR Hold] cholecalciferol  1,000 Units Oral Daily  . [MAR Hold] enoxaparin (LOVENOX) injection  30 mg Subcutaneous Q24H  . [MAR Hold] pantoprazole  40 mg Oral Daily  . [MAR Hold] simvastatin  10 mg Oral QHS  . [MAR Hold] sodium chloride flush  3 mL Intravenous Once  . [MAR Hold] sodium zirconium cyclosilicate  10 g Oral Daily   Continuous Infusions: . sodium chloride 20 mL/hr at 09/05/18 1357  . [MAR Hold] cefTRIAXone (ROCEPHIN)  IV 2 g (09/04/18 2206)     LOS: 4 days   Time Spent in minutes   30 minutes  Mindi Akerson  Gabbi Whetstone D.O. on 09/05/2018 at 2:04 PM  Between 7am to 7pm - Please see pager noted on [email or web address]  After 7pm go to [email or web address]  And look for the night coverage person covering for me after hours  [facility] Office  [phone]

## 2018-09-06 ENCOUNTER — Inpatient Hospital Stay: Payer: Self-pay

## 2018-09-06 ENCOUNTER — Encounter (HOSPITAL_COMMUNITY): Payer: Self-pay | Admitting: Internal Medicine

## 2018-09-06 ENCOUNTER — Inpatient Hospital Stay (HOSPITAL_COMMUNITY): Payer: Medicare Other

## 2018-09-06 DIAGNOSIS — I236 Thrombosis of atrium, auricular appendage, and ventricle as current complications following acute myocardial infarction: Secondary | ICD-10-CM

## 2018-09-06 DIAGNOSIS — I4821 Permanent atrial fibrillation: Secondary | ICD-10-CM

## 2018-09-06 DIAGNOSIS — I5032 Chronic diastolic (congestive) heart failure: Secondary | ICD-10-CM

## 2018-09-06 DIAGNOSIS — I4891 Unspecified atrial fibrillation: Secondary | ICD-10-CM

## 2018-09-06 DIAGNOSIS — I513 Intracardiac thrombosis, not elsewhere classified: Secondary | ICD-10-CM

## 2018-09-06 DIAGNOSIS — T82867A Thrombosis of cardiac prosthetic devices, implants and grafts, initial encounter: Secondary | ICD-10-CM

## 2018-09-06 LAB — BASIC METABOLIC PANEL
Anion gap: 7 (ref 5–15)
BUN: 21 mg/dL (ref 8–23)
CO2: 24 mmol/L (ref 22–32)
Calcium: 9.9 mg/dL (ref 8.9–10.3)
Chloride: 109 mmol/L (ref 98–111)
Creatinine, Ser: 0.93 mg/dL (ref 0.44–1.00)
GFR calc Af Amer: >60 mL/min (ref >60)
GFR calc non Af Amer: 55 mL/min — ABNORMAL LOW (ref >60)
Glucose, Bld: 82 mg/dL (ref 70–99)
Potassium: 4.6 mmol/L (ref 3.5–5.1)
Sodium: 140 mmol/L (ref 135–145)

## 2018-09-06 LAB — HEMOGLOBIN AND HEMATOCRIT, BLOOD
HCT: 36.1 % (ref 36.0–46.0)
Hemoglobin: 11.5 g/dL — ABNORMAL LOW (ref 12.0–15.0)

## 2018-09-06 LAB — CULTURE, BLOOD (ROUTINE X 2)
Culture: NO GROWTH
Culture: NO GROWTH
Special Requests: ADEQUATE

## 2018-09-06 LAB — PLATELET COUNT: Platelets: 96 10*3/uL — ABNORMAL LOW (ref 150–400)

## 2018-09-06 MED ORDER — CEFTRIAXONE IV (FOR PTA / DISCHARGE USE ONLY): 2.0000 g | INTRAVENOUS | 0 refills | Status: AC

## 2018-09-06 MED ORDER — APIXABAN 2.5 MG PO TABS: 2.5000 mg | ORAL_TABLET | Freq: Two times a day (BID) | ORAL | 1 refills | Status: DC

## 2018-09-06 NOTE — Progress Notes (Signed)
PHARMACY CONSULT NOTE FOR:  OUTPATIENT  PARENTERAL ANTIBIOTIC THERAPY (OPAT)  Indication: Group B strep bacteremia Regimen: Ceftriaxone 2g IV q24h End date: 09/29/2018  IV antibiotic discharge orders are pended. To discharging provider:  please sign these orders via discharge navigator,  Select New Orders & click on the button choice - Manage This Unsigned Work.     Thank you for allowing pharmacy to be a part of this patient's care.  Candie Mile 09/06/2018, 9:41 AM

## 2018-09-06 NOTE — Progress Notes (Signed)
Peripherally Inserted Central Catheter/Midline Placement  The IV Nurse has discussed with the patient and/or persons authorized to consent for the patient, the purpose of this procedure and the potential benefits and risks involved with this procedure.  The benefits include less needle sticks, lab draws from the catheter, and the patient may be discharged home with the catheter. Risks include, but not limited to, infection, bleeding, blood clot (thrombus formation), and puncture of an artery; nerve damage and irregular heartbeat and possibility to perform a PICC exchange if needed/ordered by physician.  Alternatives to this procedure were also discussed.  Bard Power PICC patient education guide, fact sheet on infection prevention and patient information card has been provided to patient /or left at bedside.  Phone consent obtain from daughter Rosalyn Gess.  PICC/Midline Placement Documentation  PICC Single Lumen 09/06/18 PICC Right Brachial 40 cm 0 cm (Active)  Indication for Insertion or Continuance of Line Home intravenous therapies (PICC only) 09/06/18 1332  Exposed Catheter (cm) 0 cm 09/06/18 1332  Site Assessment Clean;Dry;Intact 09/06/18 1332  Line Status Flushed;Saline locked;Blood return noted 09/06/18 1332  Dressing Type Transparent 09/06/18 1332  Dressing Status Clean;Dry;Intact;Antimicrobial disc in place 09/06/18 1332  Dressing Change Due 09/13/18 09/06/18 1332       Gordan Payment 09/06/2018, 1:33 PM

## 2018-09-06 NOTE — Discharge Summary (Signed)
Physician Discharge Summary  [name] LKG:[id number] DOB: [birth year] DOA: 09/01/2018  PCP: Lawerance Cruel, MD  Admit date: 09/01/2018 Discharge date: 09/06/2018  Time spent: 45 minutes  Recommendations for Outpatient Follow-up:  Patient will be discharged to home with home health physical therapy and nursing.  Patient will need to follow up with primary care provider within one week of discharge, repeat CBC  and BMP. Follow up with cardiology. Patient should continue medications as prescribed.  Patient should follow a heart healthy diet.   Discharge Diagnoses:  Streptococcal agalactiae bacteremia E. coli UTI History of atrial fibrillation Hyperlipidemia Essential hypertension Chronic diastolic heart failure Chronic kidney disease, stage III GERD Essential tremors Hyperkalemia  Discharge Condition: Stable  Diet recommendation: heart healthy  Filed Weights   09/05/18 1605  Weight: 68.5 kg    History of present illness:  on 09/01/2018 by Dr. Starlyn Skeans D Hardinis a 85 y.o.femalewith medical history significant ofessential tremors, osteoarthritis, basal cell carcinoma, diastolic CHF, community-acquired pneumonia who was seen in the ER yesterday and diagnosed with UTI. Patient was discharged home on cephalexin. She had blood and urine cultures collected. Blood cultures came back positive for Streptococcus. Patient was called and asked to come back to the ER today and was admitted for IV antibiotics. She denied any fever today. No nausea vomiting or diarrhea. Patient has history of dual-chamber pacemaker placement.Marland Kitchen  Hospital Course:  Streptococcal agalactiae bacteremia -Blood cultures on 08/31/2018 were positive -Blood cultures from 09/01/2018 negative to date -Patient was placed on IV Rocephin -Infectious disease consulted and appreciated -Chest x-ray unremarkable for acute infection -Patient afebrile with no leukocytosis -s/p TEE: LVEF 60 to  65%.  Right ventricle pacer leads are noted without thrombus or vegetation.  Left atrium: Severely dilated in size with 2 large mobile masses measuring 2 to 3 cm in diameter adherent to the posterior septum in the area of Coumadin ridge.  Left atrial appendage surgically ligated.  No evidence of endocarditis. -PICC line placed-as patient will need IV ceftriaxone 2 g daily until 09/29/2018  Atrial thrombi -Noted on TEE -Discussed with cardiology, will place patient on low-dose Eliquis, 2.5 mgBID given her mild thrombocytopenia.  Discontinue aspirin -Lower extremity Doppler negative for DVT -Cardiology consulted and appreciated  E. coli UTI -Urine culture from 08/31/2018 showed E. coli which was resistant to Cipro but sensitive to other antibiotics -Continue ceftriaxone  History of atrial fibrillation -Paroxysmal -Currently in sinus rhythm.  Not on any anticoagulation  Hyperlipidemia -Continue statin  Essential hypertension -BP stable, continue amlodipine, Lasix  Chronic diastolic heart failure -Currently compensated and euvolemic -Continue Lasix -Monitor intake and output, daily weights  Chronic kidney disease, stage III -Stable  GERD -Continue PPI  Essential tremors -Chronic and at baseline  Hyperkalemia -Resolved -discontinued supplemental potassium -repeat BMP in one week  Deconditioning -PT recommending home health  Chronic thrombocytopenia -Stable  Consultants Infectious disease Cardiology  Procedures  TEE Lower extremity doppler PICC line placed   Discharge Exam: Vitals:   09/06/18 1007 09/06/18 1410  BP: 116/68 (!) 116/49  Pulse: 68 72  Resp: 17 17  Temp: 98.1 F (36.7 C) 97.9 F (36.6 C)  SpO2:  97%     General: Well developed, well nourished, elderly, NAD  HEENT: NCAT, mucous membranes moist.  Cardiovascular: S1 S2 auscultated, RRR, no murmur  Respiratory: Diminished breath sounds   Abdomen: Soft, nontender, nondistended,  + bowel sounds  Extremities: warm dry without cyanosis clubbing or edema  Neuro: AAOx3, nonfocal  Psych: Appropriate mood and affect, pleasant   Discharge Instructions Discharge Instructions    Discharge instructions   Complete by: As directed    Patient will be discharged to home with home health physical therapy and nursing.  Patient will need to follow up with primary care provider within one week of discharge, repeat CBC and BMP. Follow up with cardiology.  Patient should continue medications as prescribed.  Patient should follow a heart healthy diet.   Discharge instructions   Complete by: As directed    Patient will be discharged to home with home health physical therapy and nursing.  Patient will need to follow up with primary care provider within one week of discharge, repeat CBC  and BMP. Follow up with cardiology. Patient should continue medications as prescribed.  Patient should follow a heart healthy diet.   Home infusion instructions [facility] May follow [facility] Dosing Protocol; May administer Cathflo as needed to maintain patency of vascular access device.; Flushing of vascular access device: per [facility] Protocol: 0.9% NaCl pre/post medica...   Complete by: As directed    Instructions: May follow [place] Dosing Protocol   Instructions: May administer Cathflo as needed to maintain patency of vascular access device.   Instructions: Flushing of vascular access device: per [facility] Protocol: 0.9% NaCl pre/post medication administration and prn patency; Heparin 100 u/ml, 6m for implanted ports and Heparin 10u/ml, 569mfor all other central venous catheters.   Instructions: May follow [facility] Anaphylaxis Protocol for First Dose Administration in the home: 0.9% NaCl at 25-50 ml/hr to maintain IV access for protocol meds. Epinephrine 0.3 ml IV/IM PRN and Benadryl 25-50 IV/IM PRN s/s of anaphylaxis.   Instructions: [facility] Coordinator (RN) to assist per patient IV  care needs in the home PRN.     Allergies as of 09/06/2018      Reactions   Zoloft [sertraline Hcl]       Medication List    STOP taking these medications   aspirin EC 81 MG tablet   cephALEXin 500 MG capsule Commonly known as: KEFLEX     TAKE these medications   acetaminophen 500 MG tablet Commonly known as: TYLENOL Take 500-1,000 mg by mouth daily as needed for moderate pain.   ALPRAZolam 0.5 MG tablet Commonly known as: XANAX TAKE 1 TABLET BY MOUTH AT BEDTIME What changed:   how much to take  when to take this  additional instructions   amLODipine-olmesartan 5-20 MG tablet Commonly known as: AZOR TAKE 1 TABLET BY MOUTH EVERY DAY   apixaban 2.5 MG Tabs tablet Commonly known as: ELIQUIS Take 1 tablet (2.5 mg total) by mouth 2 (two) times daily.   cefTRIAXone  IVPB Commonly known as: ROCEPHIN Inject 2 g into the vein daily for 23 days. Indication:  Group B strep bacteremia Last Day of Therapy:  09/29/2018 Labs - Once weekly:  CBC/D and BMP, Labs - Every other week:  ESR and CRP   cholecalciferol 25 MCG (1000 UT) tablet Commonly known as: VITAMIN D3 Take 1,000 Units by mouth daily.   furosemide 40 MG tablet Commonly known as: LASIX Take 1 tablet (40 mg total) by mouth daily as needed for fluid or edema. Reported on 01/28/2015   Klor-Con M20 20 MEQ tablet Generic drug: potassium chloride SA Take 1 tablet (20 mEq total) by mouth daily as needed (take with furosemide or lasix). What changed: when to take this   omeprazole 20 MG capsule Commonly known as: PRILOSEC  Take 20 mg by mouth 2 (two) times a day.   OVER THE COUNTER MEDICATION Take 1 tablet by mouth 2 (two) times daily. Nutrifuron (immune supplement)   Probiotic Caps Take 1 capsule by mouth daily.   simvastatin 10 MG tablet Commonly known as: ZOCOR TAKE 1 TABLET BY MOUTH EVERY DAY AT DINNER What changed: See the new instructions.   VITAMIN B COMPLEX PO Take 1 tablet by mouth daily.     VITAMIN B PLUS+ PO Take 1 tablet by mouth daily.            Home Infusion Instuctions  (From admission, onward)         Start     Ordered   09/06/18 0000  Home infusion instructions [facility] May follow [facility] Dosing Protocol; May administer Cathflo as needed to maintain patency of vascular access device.; Flushing of vascular access device: per [facility] Protocol: 0.9% NaCl pre/post medica...    Question Answer Comment  Instructions May follow [facility] Dosing Protocol   Instructions May administer Cathflo as needed to maintain patency of vascular access device.   Instructions Flushing of vascular access device: per [facility] Protocol: 0.9% NaCl pre/post medication administration and prn patency; Heparin 100 u/ml, 86m for implanted ports and Heparin 10u/ml, 511mfor all other central venous catheters.   Instructions May follow [facility] Anaphylaxis Protocol for First Dose Administration in the home: 0.9% NaCl at 25-50 ml/hr to maintain IV access for protocol meds. Epinephrine 0.3 ml IV/IM PRN and Benadryl 25-50 IV/IM PRN s/s of anaphylaxis.   Instructions [facility] Infusion Coordinator (RN) to assist per patient IV care needs in the home PRN.      09/06/18 1454         Allergies  Allergen Reactions   Zoloft [Sertraline Hcl]    Follow-up Information    RoLawerance CruelMD. Schedule an appointment as soon as possible for a visit in 1 week(s).   Specialty: Family Medicine Why: Hospital follow up Contact information: [address] [phone]      CrSanda KleinMD .   Specialty: Cardiology Contact information: [address] [phone]          The results of significant diagnostics from this hospitalization (including imaging, microbiology, ancillary and laboratory) are listed below for reference.    Significant Diagnostic Studies: Dg Chest Port 1 View  Result Date:  09/06/2018 CLINICAL DATA:  Status post right PICC placement today. EXAM: PORTABLE CHEST 1 VIEW COMPARISON:  Single-view of the chest 08/31/2018. FINDINGS: New right PICC is in place with its tip projecting in the mid superior vena cava. Pacing device is noted. There is cardiomegaly. Left basilar atelectasis is seen. Right lung is clear. Aortic atherosclerosis noted. The patient is rotated on the study. IMPRESSION: New right PICC tip projects in the mid superior vena cava. Subsegmental atelectasis left lung base. Electronically Signed   By: ThInge Rise.D.   On: 09/06/2018 15:10   Dg Chest Portable 1 View  Result Date: 08/31/2018 CLINICAL DATA:  Weakness, confusion EXAM: PORTABLE CHEST 1 VIEW COMPARISON:  03/20/2018 FINDINGS: Left pacer remains in place, unchanged. Cardiomegaly. Lungs clear. No effusions. No acute bony abnormality. IMPRESSION: Cardiomegaly.  No active disease. Electronically Signed   By: KeRolm Baptise.D.   On: 08/31/2018 20:56   Vas Us[place]ower Extremity Venous (dvt)  Result Date: 09/06/2018  Lower Venous Study Indications: Atrial  thrombus.  Comparison Study: no prior Performing Technologist: June Leap RDMS, RVT  Examination Guidelines: A complete evaluation includes B-mode imaging, spectral Doppler, color Doppler, and power Doppler as needed of all accessible portions of each vessel. Bilateral testing is considered an integral part of a complete examination. Limited examinations for reoccurring indications may be performed as noted.  +---------+---------------+---------+-----------+----------+-------+  RIGHT     Compressibility Phasicity Spontaneity Properties Summary  +---------+---------------+---------+-----------+----------+-------+  CFV       Full            Yes       Yes                             +---------+---------------+---------+-----------+----------+-------+  SFJ       Full                                                       +---------+---------------+---------+-----------+----------+-------+  FV Prox   Full                                                      +---------+---------------+---------+-----------+----------+-------+  FV Mid    Full                                                      +---------+---------------+---------+-----------+----------+-------+  FV Distal Full                                                      +---------+---------------+---------+-----------+----------+-------+  PFV       Full                                                      +---------+---------------+---------+-----------+----------+-------+  POP       Full            Yes       Yes                             +---------+---------------+---------+-----------+----------+-------+  PTV       Full                                                      +---------+---------------+---------+-----------+----------+-------+  PERO      Full                                                      +---------+---------------+---------+-----------+----------+-------+   +---------+---------------+---------+-----------+----------+-------+  LEFT      Compressibility Phasicity Spontaneity Properties Summary  +---------+---------------+---------+-----------+----------+-------+  CFV       Full            Yes       Yes                             +---------+---------------+---------+-----------+----------+-------+  SFJ       Full                                                      +---------+---------------+---------+-----------+----------+-------+  FV Prox   Full                                                      +---------+---------------+---------+-----------+----------+-------+  FV Mid    Full                                                      +---------+---------------+---------+-----------+----------+-------+  FV Distal Full                                                      +---------+---------------+---------+-----------+----------+-------+  PFV       Full                                                       +---------+---------------+---------+-----------+----------+-------+  POP       Full            Yes       Yes                             +---------+---------------+---------+-----------+----------+-------+  PTV       Full                                                      +---------+---------------+---------+-----------+----------+-------+  PERO      Full                                                      +---------+---------------+---------+-----------+----------+-------+     Summary: Right: There is no evidence of deep vein thrombosis in the lower extremity. No cystic structure found in the popliteal fossa. Left: There is no evidence of deep vein thrombosis in the lower extremity. No cystic structure  found in the popliteal fossa.  *See table(s) above for measurements and observations.    Preliminary    [place] Ekg Site Rite  Result Date: 09/06/2018 If [facility] image not attached, placement could not be confirmed due to current cardiac rhythm.   Microbiology: Recent Results (from the past 240 hour(s))  Blood culture (routine x 2)     Status: Abnormal   Collection Time: 08/31/18  8:30 PM   Specimen: BLOOD  Result Value Ref Range Status   Specimen Description   Final    BLOOD LEFT ANTECUBITAL Performed at [facility], [address] [place], [place] [address]    Special Requests   Final    BOTTLES DRAWN AEROBIC AND ANAEROBIC Blood Culture adequate volume Performed at [facility], [address] [place], [place] [address]    Culture  Setup Time   Final    GRAM POSITIVE COCCI IN CHAINS IN BOTH AEROBIC AND ANAEROBIC BOTTLES CRITICAL RESULT CALLED TO, READ BACK BY AND VERIFIED WITH: RN AT [facility] S GOUGE [id number] AT 1453 BY CM GRAM POSITIVE RODS CRITICAL RESULT CALLED TO, READ BACK BY AND VERIFIED WITH: PHARMD Y STEENWYK [id number] AT 800 AM BY CM    Culture (A)  Final    GROUP B STREP(S.AGALACTIAE)ISOLATED STAPHYLOCOCCUS  SPECIES (COAGULASE NEGATIVE) ACTINOMYCES NEUII Standardized susceptibility testing for this organism is not available. Performed at [facility], [place] [address], [place], [place] [address]    Report Status 09/04/2018 FINAL  Final   Organism ID, Bacteria GROUP B STREP(S.AGALACTIAE)ISOLATED  Final      Susceptibility   Group b strep(s.agalactiae)isolated - MIC*    CLINDAMYCIN >=1 RESISTANT Resistant     AMPICILLIN <=0.25 SENSITIVE Sensitive     ERYTHROMYCIN >=8 RESISTANT Resistant     VANCOMYCIN 0.5 SENSITIVE Sensitive     CEFTRIAXONE <=0.12 SENSITIVE Sensitive     LEVOFLOXACIN 0.5 SENSITIVE Sensitive     * GROUP B STREP(S.AGALACTIAE)ISOLATED  Blood Culture ID Panel (Reflexed)     Status: Abnormal   Collection Time: 08/31/18  8:30 PM  Result Value Ref Range Status   Enterococcus species NOT DETECTED NOT DETECTED Final   Listeria monocytogenes NOT DETECTED NOT DETECTED Final   Staphylococcus species NOT DETECTED NOT DETECTED Final   Staphylococcus aureus (BCID) NOT DETECTED NOT DETECTED Final   Streptococcus species DETECTED (A) NOT DETECTED Final    Comment: CRITICAL RESULT CALLED TO, READ BACK BY AND VERIFIED WITH: RN AT [facility] D GOUGE [id number] AT 1453 BY CM    Streptococcus agalactiae DETECTED (A) NOT DETECTED Final    Comment: CRITICAL RESULT CALLED TO, READ BACK BY AND VERIFIED WITH: RN AT [facility] S GOUGE [id number] AT 6808 BY CM    Streptococcus pneumoniae NOT DETECTED NOT DETECTED Final   Streptococcus pyogenes NOT DETECTED NOT DETECTED Final   Acinetobacter baumannii NOT DETECTED NOT DETECTED Final   Enterobacteriaceae species NOT DETECTED NOT DETECTED Final   Enterobacter cloacae complex NOT DETECTED NOT DETECTED Final   Escherichia coli NOT DETECTED NOT DETECTED Final   Klebsiella oxytoca NOT DETECTED NOT DETECTED Final   Klebsiella pneumoniae NOT DETECTED NOT DETECTED Final   Proteus species NOT DETECTED NOT DETECTED Final   Serratia marcescens NOT DETECTED NOT DETECTED  Final   Haemophilus influenzae NOT DETECTED NOT DETECTED Final   Neisseria meningitidis NOT DETECTED NOT DETECTED Final   Pseudomonas aeruginosa NOT DETECTED NOT DETECTED Final   Candida albicans NOT DETECTED NOT DETECTED Final  Candida glabrata NOT DETECTED NOT DETECTED Final   Candida krusei NOT DETECTED NOT DETECTED Final   Candida parapsilosis NOT DETECTED NOT DETECTED Final   Candida tropicalis NOT DETECTED NOT DETECTED Final    Comment: Performed at [facility], [place] [address] [place], [place] [address]  Blood culture (routine x 2)     Status: None   Collection Time: 08/31/18  8:40 PM   Specimen: BLOOD RIGHT WRIST  Result Value Ref Range Status   Specimen Description   Final    BLOOD RIGHT WRIST Performed at [facility], [address] [place], [place] [address]    Special Requests   Final    BOTTLES DRAWN AEROBIC AND ANAEROBIC Blood Culture adequate volume Performed at [facility], [address] [place], [place] [address]    Culture   Final    NO GROWTH 5 DAYS Performed at [facility], [place] [address], [place], [place] [address]    Report Status 09/05/2018 FINAL  Final  Urine culture     Status: Abnormal   Collection Time: 08/31/18  8:40 PM   Specimen: Urine, Random  Result Value Ref Range Status   Specimen Description   Final    URINE, RANDOM Performed at [facility], [address] [place], [place] [address]    Special Requests   Final    NONE Performed at [facility], [address] [place], [place] [address]    Culture >=100,000 COLONIES/mL ESCHERICHIA COLI (A)  Final   Report Status 09/03/2018 FINAL  Final   Organism ID, Bacteria ESCHERICHIA COLI (A)  Final      Susceptibility   Escherichia coli - MIC*    AMPICILLIN <=2 SENSITIVE Sensitive     CEFAZOLIN <=4 SENSITIVE Sensitive     CEFTRIAXONE <=1 SENSITIVE Sensitive     CIPROFLOXACIN >=4 RESISTANT Resistant     GENTAMICIN <=1 SENSITIVE  Sensitive     IMIPENEM <=0.25 SENSITIVE Sensitive     NITROFURANTOIN <=16 SENSITIVE Sensitive     TRIMETH/SULFA <=20 SENSITIVE Sensitive     AMPICILLIN/SULBACTAM <=2 SENSITIVE Sensitive     PIP/TAZO <=4 SENSITIVE Sensitive     Extended ESBL NEGATIVE Sensitive     * >=100,000 COLONIES/mL ESCHERICHIA COLI  Blood culture (routine x 2)     Status: None   Collection Time: 09/01/18  2:53 PM   Specimen: BLOOD  Result Value Ref Range Status   Specimen Description BLOOD LEFT ANTECUBITAL  Final   Special Requests   Final    BOTTLES DRAWN AEROBIC AND ANAEROBIC Blood Culture results may not be optimal due to an inadequate volume of blood received in culture bottles   Culture   Final    NO GROWTH 5 DAYS Performed at [facility], [place] [address] [place], [place] [address]    Report Status 09/06/2018 FINAL  Final  Blood culture (routine x 2)     Status: None   Collection Time: 09/01/18  2:58 PM   Specimen: BLOOD LEFT FOREARM  Result Value Ref Range Status   Specimen Description BLOOD LEFT FOREARM  Final   Special Requests   Final    BOTTLES DRAWN AEROBIC AND ANAEROBIC Blood Culture adequate volume   Culture   Final    NO GROWTH 5 DAYS Performed at [facility], [place] [address], [place], [place] [address]    Report Status 09/06/2018 FINAL  Final  SARS Coronavirus 2 (CEPHEID - Performed in [place] [facility] lab), Hosp Order     Status: None   Collection Time: 09/03/18  3:45 PM   Specimen: Nasopharyngeal Swab  Result Value Ref Range Status   SARS Coronavirus 2 NEGATIVE NEGATIVE Final    Comment: (NOTE) If result is NEGATIVE SARS-CoV-2 target nucleic acids are NOT DETECTED. The SARS-CoV-2 RNA is generally detectable in upper and lower  respiratory specimens during the acute phase of infection. The lowest  concentration of SARS-CoV-2 viral copies this assay can detect is 250  copies / mL. A negative result does not preclude SARS-CoV-2 infection  and should not be used as the  sole basis for treatment or other  patient management decisions.  A negative result may occur with  improper specimen collection / handling, submission of specimen other  than nasopharyngeal swab, presence of viral mutation(s) within the  areas targeted by this assay, and inadequate number of viral copies  (<250 copies / mL). A negative result must be combined with clinical  observations, patient history, and epidemiological information. If result is POSITIVE SARS-CoV-2 target nucleic acids are DETECTED. The SARS-CoV-2 RNA is generally detectable in upper and lower  respiratory specimens dur ing the acute phase of infection.  Positive  results are indicative of active infection with SARS-CoV-2.  Clinical  correlation with patient history and other diagnostic information is  necessary to determine patient infection status.  Positive results do  not rule out bacterial infection or co-infection with other viruses. If result is PRESUMPTIVE POSTIVE SARS-CoV-2 nucleic acids MAY BE PRESENT.   A presumptive positive result was obtained on the submitted specimen  and confirmed on repeat testing.  While 2019 novel coronavirus  (SARS-CoV-2) nucleic acids may be present in the submitted sample  additional confirmatory testing may be necessary for epidemiological  and / or clinical management purposes  to differentiate between  SARS-CoV-2 and other Sarbecovirus currently known to infect humans.  If clinically indicated additional testing with an alternate test  methodology [phone]) is advised. The SARS-CoV-2 RNA is generally  detectable in upper and lower respiratory sp ecimens during the acute  phase of infection. The expected result is Negative. Fact Sheet for Patients:  StrictlyIdeas.no Fact Sheet for Healthcare Providers: BankingDealers.co.za This test is not yet approved or cleared by the [place] FDA and has been authorized for detection  and/or diagnosis of SARS-CoV-2 by FDA under an Emergency Use Authorization (EUA).  This EUA will remain in effect (meaning this test can be used) for the duration of the COVID-19 declaration under Section 564(b)(1) of the Act, 21 U.S.C. section 360bbb-3(b)(1), unless the authorization is terminated or revoked sooner. Performed at [facility], [place] [address], [place], [place] [address]      Labs: Basic Metabolic Panel: Recent Labs  Lab 09/02/18 0400 09/03/18 0317 09/04/18 0339 09/05/18 0329 09/06/18 0232  NA 141 139 141 138 140  K 4.7 5.0 5.0 5.5* 4.6  CL 106 106 108 107 109  CO2 '27 26 27 26 24  ' GLUCOSE 89 88 92 94 82  BUN '19 20 22 ' 27* 21  CREATININE 1.17* 1.14* 1.11* 1.17* 0.93  CALCIUM 10.4* 10.3 10.3 10.3 9.9   Liver Function Tests: Recent Labs  Lab 08/31/18 1955 09/01/18 1700 09/02/18 0400  AST '25 23 20  ' ALT '15 13 11  ' ALKPHOS 54 51 41  BILITOT 1.0 0.8 0.6  PROT 6.6 6.0* 5.1*  ALBUMIN 4.1 3.8 3.1*   No results for  input(s): LIPASE, AMYLASE in the last 168 hours. No results for input(s): AMMONIA in the last 168 hours. CBC: Recent Labs  Lab 09/01/18 1700 09/01/18 2335 09/02/18 0400 09/03/18 0317 09/04/18 0339 09/05/18 0329 09/06/18 0232  WBC 5.7 5.7 5.3 5.2 5.4 5.3  --   NEUTROABS 3.3  --   --   --   --   --   --   HGB 13.3 11.9* 12.1 12.1 12.0 11.5* 11.5*  HCT 42.4 37.5 37.9 37.9 38.1 35.8* 36.1  MCV 97.9 97.2 97.4 95.7 97.9 96.0  --   PLT 108* 94* 94* 91* 92* 97* 96*   Cardiac Enzymes: No results for input(s): CKTOTAL, CKMB, CKMBINDEX, TROPONINI in the last 168 hours. BNP: BNP (last 3 results) Recent Labs    02/11/18 0340  BNP 79.2    ProBNP (last 3 results) No results for input(s): PROBNP in the last 8760 hours.  CBG: No results for input(s): GLUCAP in the last 168 hours.     Signed:  Cristal Ford  [facility] 09/06/2018, 3:14 PM

## 2018-09-06 NOTE — Progress Notes (Signed)
LE venous duplex       has been completed. Preliminary results can be found under CV proc through chart review. Nayelis Bonito, BS, RDMS, RVT   

## 2018-09-06 NOTE — TOC Initial Note (Addendum)
Transition of Care [facility]) - Initial/Assessment Note    Patient Details  Name: [name] MRN: [id number] Date of Birth: 03-27-1929  [facility] CM/SW Contact:    Christina Halsted, LCSW Phone Number: 09/06/2018, 3:27 PM  Clinical Narrative:                 CSW received consult for possible home health services at time of discharge. CSW spoke with patient regarding PT recommendation of Home Health PT at time of discharge. Patient is currently active with [facility] and will continue services. They are aware. Patient has completed teaching with [name] ([name]) for IV antibiotics at home. CSW confirmed PCP and address with patient. Patient's daughter states she will come pick patient up as soon as her car is fixed today. No further questions reported at this time.   Expected Discharge Plan: [place] Barriers to Discharge: No Barriers Identified   Patient Goals and CMS Choice Patient states their goals for this hospitalization and ongoing recovery are:: Return home CMS Medicare.gov Compare Post Acute Care list provided to:: (NA-Already active with [facility]) Choice offered to / list presented to : NA  Expected Discharge Plan and Services Expected Discharge Plan: [place] In-house Referral: NA Discharge Planning Services: CM Consult Post Acute Care Choice: [place] arrangements for the past 2 months: Single Family Home Expected Discharge Date: 09/06/18               DME Arranged: N/A         HH Arranged: RN, PT, OT, Nurse's Aide, Social Work [employer] Agency: [facility] [place] ([place]) Date [place]: 09/06/18 Time [place]: 1526 Representative spoke with at [facility] [place]: [name]  Prior Living Arrangements/Services Living arrangements for the past 2 months: [place] Lives with:: Adult Children Patient language and need for interpreter reviewed:: Yes Do you feel safe going back to the  place where you live?: Yes      Need for Family Participation in Patient Care: No (Comment) Care giver support system in place?: Yes (comment) Current home services: DME Criminal Activity/Legal Involvement Pertinent to Current Situation/Hospitalization: No - Comment as needed  Activities of Daily Living      Permission Sought/Granted Permission sought to share information with : Family Supports Permission granted to share information with : Yes, Verbal Permission Granted  Share Information with NAME: [name]  Permission granted to share info w AGENCY: [place] granted to share info w Relationship: Daughter  Permission granted to share info w Contact Information: [phone]  Emotional Assessment Appearance:: Appears stated age Attitude/Demeanor/Rapport: Engaged Affect (typically observed): Accepting, Appropriate Orientation: : Oriented to Self, Oriented to Place, Oriented to  Time, Oriented to Situation Alcohol / Substance Use: Not Applicable Psych Involvement: No (comment)  Admission diagnosis:  Bacteremia [R78.81] Acute cystitis without hematuria [N30.00] Patient Active Problem List   Diagnosis Date Noted  . Bacteremia 09/01/2018  . Tachycardia-bradycardia syndrome ([place])   . Atrial fibrillation with slow ventricular response ([place])   . Pacemaker battery depletion 08/16/2018  . Fall 02/11/2018  . GERD (gastroesophageal reflux disease) 02/11/2018  . Anxiety 02/11/2018  . CKD (chronic kidney disease), stage III ([place]) 02/11/2018  . UTI (urinary tract infection) 02/11/2018  . Need for prophylactic vaccination and inoculation against influenza 11/19/2015  . Acute encephalopathy 01/31/2015  . Pneumonia, community acquired 01/28/2015  . PNA (pneumonia) 01/28/2015  . Pacemaker 05/06/2014  . Bilateral  leg edema 08/20/2013  . S/P mitral valve replacement with bioprosthetic valve - 29 mm Biocor 04/22/2013  . Permanent atrial fibrillation 04/22/2013  . Essential  hypertension 04/22/2013  . Hyperlipidemia 04/22/2013  . Hypercalcemia 11/28/2012  . Ureteral stone 08/02/2012  . Acute renal failure ([place]) 08/02/2012  . Thrombocytopenia ([place]) 01/20/2011  . Chronic diastolic congestive heart failure ([place]) 04/04/2007  . BRONCHOPNEUMONIA ORGANISM UNSPECIFIED 03/13/2007   PCP:  Lawerance Cruel, MD Pharmacy:   [id number] [address], [place] [address] Phone: [phone] Fax: [phone]     Social Determinants of Health (SDOH) Interventions    Readmission Risk Interventions No flowsheet data found.

## 2018-09-06 NOTE — Progress Notes (Addendum)
1320: Call to daughter [name]. Updated on condition and plans for discharge. States her car is in the shop. Would like update from social work.  1604: Call from son. States he does not believe patient is ready for discharge. Wants to speak with physician. Page sent to MD.  1900: Patient discharged home with personal belongings. No signs of distress noted.

## 2018-09-06 NOTE — Plan of Care (Signed)
  Problem: Education: Goal: Knowledge of General Education information will improve Description: Including pain rating scale, medication(s)/side effects and non-pharmacologic comfort measures Outcome: Progressing   Problem: Health Behavior/Discharge Planning: Goal: Ability to manage health-related needs will improve Outcome: Progressing   Problem: Clinical Measurements: Goal: Ability to maintain clinical measurements within normal limits will improve Outcome: Progressing Goal: Will remain free from infection Outcome: Progressing Goal: Diagnostic test results will improve Outcome: Progressing   Problem: Elimination: Goal: Will not experience complications related to bowel motility Outcome: Progressing Goal: Will not experience complications related to urinary retention Outcome: Progressing   Problem: Safety: Goal: Ability to remain free from injury will improve Outcome: Progressing   Problem: Skin Integrity: Goal: Risk for impaired skin integrity will decrease Outcome: Progressing

## 2018-09-06 NOTE — Progress Notes (Addendum)
[facility] for Infectious Disease   Reason for visit: Follow up on GBS bacteremia  Interval History: Afebrile, WBC WNL, patient denies fever, chills, pain. TEE shows no evidence for vegetations/endocarditis. TEE revealed large left atrial thrombus, cardiologist aware. Blood cultures from 7/15 show no growth at 5 days. Plan for outpatient antibiotic treatment explained to patient.   Physical Exam: Constitutional: Patients appears in NAD Vitals:   09/06/18 1007 09/06/18 1410  BP: 116/68 (!) 116/49  Pulse: 68 72  Resp: 17 17  Temp: 98.1 F (36.7 C) 97.9 F (36.6 C)  SpO2:  97%  Respiratory: Normal respiratory effort; CTAB Cardiovascular: RRR GI: soft, NT, ND  Review of Systems: Review of Systems  Constitutional: Negative for chills and fever.  Gastrointestinal: Negative for abdominal pain.     Lab Results  Component Value Date   WBC 5.3 09/05/2018   HGB 11.5 (L) 09/06/2018   HCT 36.1 09/06/2018   MCV 96.0 09/05/2018   PLT 96 (L) 09/06/2018    Lab Results  Component Value Date   CREATININE 0.93 09/06/2018   BUN 21 09/06/2018   NA 140 09/06/2018   K 4.6 09/06/2018   CL 109 09/06/2018   CO2 24 09/06/2018    Lab Results  Component Value Date   ALT 11 09/02/2018   AST 20 09/02/2018   ALKPHOS 41 09/02/2018     Microbiology: Recent Results (from the past 240 hour(s))  Blood culture (routine x 2)     Status: Abnormal   Collection Time: 08/31/18  8:30 PM   Specimen: BLOOD  Result Value Ref Range Status   Specimen Description   Final    BLOOD LEFT ANTECUBITAL Performed at [facility], [place] [address] [place], [place] [address]    Special Requests   Final    BOTTLES DRAWN AEROBIC AND ANAEROBIC Blood Culture adequate volume Performed at [facility], [address] [place], [place] [address]    Culture  Setup Time   Final    GRAM POSITIVE COCCI IN CHAINS IN BOTH AEROBIC AND ANAEROBIC BOTTLES CRITICAL RESULT CALLED TO, READ BACK BY AND  VERIFIED WITH: RN AT [facility] S GOUGE [id number] AT 1453 BY CM GRAM POSITIVE RODS CRITICAL RESULT CALLED TO, READ BACK BY AND VERIFIED WITH: PHARMD Y STEENWYK [id number] AT 800 AM BY CM    Culture (A)  Final    GROUP B STREP(S.AGALACTIAE)ISOLATED STAPHYLOCOCCUS SPECIES (COAGULASE NEGATIVE) ACTINOMYCES NEUII Standardized susceptibility testing for this organism is not available. Performed at [facility], [place] [address] [place], [place] [address]    Report Status 09/04/2018 FINAL  Final   Organism ID, Bacteria GROUP B STREP(S.AGALACTIAE)ISOLATED  Final      Susceptibility   Group b strep(s.agalactiae)isolated - MIC*    CLINDAMYCIN >=1 RESISTANT Resistant     AMPICILLIN <=0.25 SENSITIVE Sensitive     ERYTHROMYCIN >=8 RESISTANT Resistant     VANCOMYCIN 0.5 SENSITIVE Sensitive     CEFTRIAXONE <=0.12 SENSITIVE Sensitive     LEVOFLOXACIN 0.5 SENSITIVE Sensitive     * GROUP B STREP(S.AGALACTIAE)ISOLATED  Blood Culture ID Panel (Reflexed)     Status: Abnormal   Collection Time: 08/31/18  8:30 PM  Result Value Ref Range Status   Enterococcus species NOT DETECTED NOT DETECTED Final   Listeria monocytogenes NOT DETECTED NOT DETECTED Final   Staphylococcus species NOT DETECTED NOT DETECTED Final   Staphylococcus aureus (BCID) NOT DETECTED NOT DETECTED Final   Streptococcus species DETECTED (  A) NOT DETECTED Final    Comment: CRITICAL RESULT CALLED TO, READ BACK BY AND VERIFIED WITH: RN AT [facility] D GOUGE [id number] AT 1453 BY CM    Streptococcus agalactiae DETECTED (A) NOT DETECTED Final    Comment: CRITICAL RESULT CALLED TO, READ BACK BY AND VERIFIED WITH: RN AT [facility] S GOUGE [id number] AT 3557 BY CM    Streptococcus pneumoniae NOT DETECTED NOT DETECTED Final   Streptococcus pyogenes NOT DETECTED NOT DETECTED Final   Acinetobacter baumannii NOT DETECTED NOT DETECTED Final   Enterobacteriaceae species NOT DETECTED NOT DETECTED Final   Enterobacter cloacae complex NOT DETECTED NOT DETECTED Final    Escherichia coli NOT DETECTED NOT DETECTED Final   Klebsiella oxytoca NOT DETECTED NOT DETECTED Final   Klebsiella pneumoniae NOT DETECTED NOT DETECTED Final   Proteus species NOT DETECTED NOT DETECTED Final   Serratia marcescens NOT DETECTED NOT DETECTED Final   Haemophilus influenzae NOT DETECTED NOT DETECTED Final   Neisseria meningitidis NOT DETECTED NOT DETECTED Final   Pseudomonas aeruginosa NOT DETECTED NOT DETECTED Final   Candida albicans NOT DETECTED NOT DETECTED Final   Candida glabrata NOT DETECTED NOT DETECTED Final   Candida krusei NOT DETECTED NOT DETECTED Final   Candida parapsilosis NOT DETECTED NOT DETECTED Final   Candida tropicalis NOT DETECTED NOT DETECTED Final    Comment: Performed at [facility], [place]. [address] [place], [place] [address]  Blood culture (routine x 2)     Status: None   Collection Time: 08/31/18  8:40 PM   Specimen: BLOOD RIGHT WRIST  Result Value Ref Range Status   Specimen Description   Final    BLOOD RIGHT WRIST Performed at [facility], [address] [place], [place] [address]    Special Requests   Final    BOTTLES DRAWN AEROBIC AND ANAEROBIC Blood Culture adequate volume Performed at [facility], [address] [place], [place] [address]    Culture   Final    NO GROWTH 5 DAYS Performed at [facility], [place] [address] [place], [place] [address]    Report Status 09/05/2018 FINAL  Final  Urine culture     Status: Abnormal   Collection Time: 08/31/18  8:40 PM   Specimen: Urine, Random  Result Value Ref Range Status   Specimen Description   Final    URINE, RANDOM Performed at [facility], [address] [place], [place] [address]    Special Requests   Final    NONE Performed at [facility], [address] [place], [place] [address]    Culture >=100,000 COLONIES/mL ESCHERICHIA COLI (A)  Final   Report Status 09/03/2018 FINAL  Final   Organism ID, Bacteria ESCHERICHIA  COLI (A)  Final      Susceptibility   Escherichia coli - MIC*    AMPICILLIN <=2 SENSITIVE Sensitive     CEFAZOLIN <=4 SENSITIVE Sensitive     CEFTRIAXONE <=1 SENSITIVE Sensitive     CIPROFLOXACIN >=4 RESISTANT Resistant     GENTAMICIN <=1 SENSITIVE Sensitive     IMIPENEM <=0.25 SENSITIVE Sensitive     NITROFURANTOIN <=16 SENSITIVE Sensitive     TRIMETH/SULFA <=20 SENSITIVE Sensitive     AMPICILLIN/SULBACTAM <=2 SENSITIVE Sensitive     PIP/TAZO <=4 SENSITIVE Sensitive     Extended ESBL NEGATIVE Sensitive     * >=100,000 COLONIES/mL ESCHERICHIA COLI  Blood culture (routine x 2)  Status: None   Collection Time: 09/01/18  2:53 PM   Specimen: BLOOD  Result Value Ref Range Status   Specimen Description BLOOD LEFT ANTECUBITAL  Final   Special Requests   Final    BOTTLES DRAWN AEROBIC AND ANAEROBIC Blood Culture results may not be optimal due to an inadequate volume of blood received in culture bottles   Culture   Final    NO GROWTH 5 DAYS Performed at [facility], [place] [address], [place], [place] [address]    Report Status 09/06/2018 FINAL  Final  Blood culture (routine x 2)     Status: None   Collection Time: 09/01/18  2:58 PM   Specimen: BLOOD LEFT FOREARM  Result Value Ref Range Status   Specimen Description BLOOD LEFT FOREARM  Final   Special Requests   Final    BOTTLES DRAWN AEROBIC AND ANAEROBIC Blood Culture adequate volume   Culture   Final    NO GROWTH 5 DAYS Performed at [facility], [place] [address] [place], [place] [address]    Report Status 09/06/2018 FINAL  Final  SARS Coronavirus 2 (CEPHEID - Performed in [place] hospital lab), Hosp Order     Status: None   Collection Time: 09/03/18  3:45 PM   Specimen: Nasopharyngeal Swab  Result Value Ref Range Status   SARS Coronavirus 2 NEGATIVE NEGATIVE Final    Comment: (NOTE) If result is NEGATIVE SARS-CoV-2 target nucleic acids are NOT DETECTED. The SARS-CoV-2 RNA is generally detectable in upper and  lower  respiratory specimens during the acute phase of infection. The lowest  concentration of SARS-CoV-2 viral copies this assay can detect is 250  copies / mL. A negative result does not preclude SARS-CoV-2 infection  and should not be used as the sole basis for treatment or other  patient management decisions.  A negative result may occur with  improper specimen collection / handling, submission of specimen other  than nasopharyngeal swab, presence of viral mutation(s) within the  areas targeted by this assay, and inadequate number of viral copies  (<250 copies / mL). A negative result must be combined with clinical  observations, patient history, and epidemiological information. If result is POSITIVE SARS-CoV-2 target nucleic acids are DETECTED. The SARS-CoV-2 RNA is generally detectable in upper and lower  respiratory specimens dur ing the acute phase of infection.  Positive  results are indicative of active infection with SARS-CoV-2.  Clinical  correlation with patient history and other diagnostic information is  necessary to determine patient infection status.  Positive results do  not rule out bacterial infection or co-infection with other viruses. If result is PRESUMPTIVE POSTIVE SARS-CoV-2 nucleic acids MAY BE PRESENT.   A presumptive positive result was obtained on the submitted specimen  and confirmed on repeat testing.  While 2019 novel coronavirus  (SARS-CoV-2) nucleic acids may be present in the submitted sample  additional confirmatory testing may be necessary for epidemiological  and / or clinical management purposes  to differentiate between  SARS-CoV-2 and other Sarbecovirus currently known to infect humans.  If clinically indicated additional testing with an alternate test  methodology [phone]) is advised. The SARS-CoV-2 RNA is generally  detectable in upper and lower respiratory sp ecimens during the acute  phase of infection. The expected result is Negative.  Fact Sheet for Patients:  StrictlyIdeas.no Fact Sheet for Healthcare Providers: BankingDealers.co.za This test is not yet approved or cleared by the [place] FDA and has been authorized for  detection and/or diagnosis of SARS-CoV-2 by FDA under an Emergency Use Authorization (EUA).  This EUA will remain in effect (meaning this test can be used) for the duration of the COVID-19 declaration under Section 564(b)(1) of the Act, 21 U.S.C. section 360bbb-3(b)(1), unless the authorization is terminated or revoked sooner. Performed at [facility], [place] [address] [place], [place] [address]     Impression: Ms. Kinch is an 85 yo female with history significant for dual chamber pacemaker placement in 2009 with recent manipulation earlier this month and mitral valve replacement in 2001 who presented with GBS bacteremia. No evidence for vegetations on cardiac leads or endocarditis.  Plan:  1. Will complete 4 week IV antibiotic course (rocephin) as outpatient. OPAT order placed by pharmacy 2. Followup in ID clinic. May consider repeating blood cultures week after concluding antibiotic therapy.

## 2018-09-06 NOTE — Consult Note (Signed)
Cardiology Consultation:   Patient ID: [name] MRN: [id number]; DOB: 09-29-1929  Admit date: 09/01/2018 Date of Consult: 09/06/2018  Primary Care Provider: Lawerance Cruel, MD Primary Cardiologist: Sanda Klein, MD  Primary Electrophysiologist:  None    Patient Profile:   [name] is a 85 y.o. female admitted with altered mental status and fever, who is being seen today for the evaluation of incidentally discovered large left atrial thrombus at the request of Dr. Ree Kida.  History of Present Illness:   Ms. Montas has a hx of permanent atrial fibrillation with slow ventricular response, permanent pacemaker (not device dependent, recent generator change, Medtronic, programmed VVIR), history of mitral valve replacement with biological prosthesis (2011 29 mm Biocor, Dr. Prescott Gum), history of left atrial appendage ligation and previous surgical maze procedure, chronic heart failure with preserved left ventricular systolic function thrombocytopenia, history of falls.  For many years she was not treated with oral anticoagulants due to a perceived increased risk of bleeding complications in the setting of thrombocytopenia, unsteady gait.  Also her risk of intracardiac thrombus was felt to be lower since she had a history of previous left atrial appendage ligation at the time of her mitral valve prosthesis.  She presented with fever and alteration in mental status, attributed to UTI and was found to have bacteremia with Streptococcus agalactiae as well as E. Coli and admitted to the hospital.  Due to concern for prosthetic valve endocarditis she underwent a transesophageal echocardiogram yesterday.  There was no evidence of vegetations or annular abscess, but to very large partly mobile left atrial thrombi were seen in the body of the left atrium.  The left atrial appendage was effectively oversewn without any evidence of flow.  Anticoagulants have been initiated.  She feels  better after treatment with antibiotics.  There is plan to send her home with a PICC line and intravenous antibiotics, later today.  Heart Pathway Score:     Past Medical History:  Diagnosis Date   Anxiety    Arthritis    "mostly my hands" (01/29/2015)   Basal cell carcinoma of forehead    Benign essential tremor    CAP (community acquired pneumonia) 01/29/2015   CHF (congestive heart failure) ([place])    Depression    Edema of both legs 04-23-2013  pt states feet swelling (wears ted hose)   bilateral venous ablation procedures done in IR, L>R   GERD (gastroesophageal reflux disease)    H/O hiatal hernia    Hyperlipidemia    Hyperparathyroidism ([place])    MILD--  STABLE   Hypertension    Kidney stones    Osteopenia    Permanent atrial fibrillation    CARDIOLOGIST-- DR PJKDTOIZ   Pneumonia "several times"   Presence of permanent cardiac pacemaker    LAST PACER CHECK  04-23-2013  IN [facility]   Right ureteral stone    S/P mitral valve replacement with bioprosthetic valve    04-03-2009   Schatzki's ring    Sick sinus syndrome ([place])    S/P  PACEMAKER  2009   Thrombocytopenia, immune ([place])    CHRONIC---  HEMOTOLOGIST---  DR ENNEVER   Venous insufficiency, peripheral    Wears glasses     Past Surgical History:  Procedure Laterality Date   CARDIAC CATHETERIZATION  03/27/2009   normal coronaries; grade IV Mitral Insuff.   CARDIAC CATHETERIZATION  2001  &  2006   CARDIAC VALVE REPLACEMENT     CATARACT EXTRACTION W/ INTRAOCULAR  LENS  IMPLANT, BILATERAL Bilateral ~ 2012   CYSTOSCOPY W/ URETERAL STENT PLACEMENT Left 08/01/2012   Procedure: CYSTOSCOPY WITH RETROGRADE PYELOGRAM/URETERAL STENT PLACEMENT;  Surgeon: Dutch Gray, MD;  Location: [facility];  Service: Urology;  Laterality: Left;   CYSTOSCOPY WITH URETEROSCOPY Left 08/14/2012   Procedure: LEFT URETEROSCOPY WITH HOLMIUM LASER AND LEFT  STENT PLACEMENT;  Surgeon: Malka So, MD;  Location: [facility];  Service:  Urology;  Laterality: Left;   CYSTOSCOPY WITH URETEROSCOPY AND STENT PLACEMENT Right 04/25/2013   Procedure: RIGHT URETEROSCOPY WITH  STENT PLACEMENT;  Surgeon: Irine Seal, MD;  Location: [facility];  Service: Urology;  Laterality: Right;   ESOPHAGOGASTRODUODENOSCOPY (EGD) WITH ESOPHAGEAL DILATION  "a couple times" (01/29/2015)   EXTRACORPOREAL SHOCK WAVE LITHOTRIPSY Left 07-26-2012   HAND SURGERY Right 05/2011   "no fracture; straightened things up   HAND SURGERY Left 2014   "no fracture; straightened things up"   HOLMIUM LASER APPLICATION Left 03/23/7822   Procedure:  HOLMIUM LASER APPLICATION;  Surgeon: Malka So, MD;  Location: [facility];  Service: Urology;  Laterality: Left;   HOLMIUM LASER APPLICATION Right 2/35/3614   Procedure: HOLMIUM LASER APPLICATION;  Surgeon: Irine Seal, MD;  Location: [facility];  Service: Urology;  Laterality: Right;   MITRAL VALVE REPLACEMENT WITH CHORDAL PRESEVATION USING BIOPROSTHETIC VALVE/  LIGATION LEFT ATRIAL APPENDAGE/  LEFT-SIDED MAZE PROCEDURE  04-03-2009  DR Ethel Rana,  SERIAL # [id number]   NM MYOCAR PERF WALL MOTION  03/01/1999   mild lateral ischemia   PERMANENT PACEMAKER INSERTION  02/26/2007   Dual Chamber Medtronic Programmed VVIR--- Generator [id number]/  #[id number]    PPM GENERATOR CHANGEOUT N/A 08/20/2018   Procedure: PPM GENERATOR CHANGEOUT;  Surgeon: Sanda Klein, MD;  Location: [place] [facility];  Service: Cardiovascular;  Laterality: N/A;   TEE WITHOUT CARDIOVERSION N/A 09/05/2018   Procedure: TRANSESOPHAGEAL ECHOCARDIOGRAM (TEE);  Surgeon: Pixie Casino, MD;  Location: [facility];  Service: Cardiovascular;  Laterality: N/A;   TRANSTHORACIC ECHOCARDIOGRAM  09-07-2012  DR Billy Rocco   MILD LVH/  EF 55-60%/  MILD TO MODERATE AR/   SEVERE LAE  &  RAE/  MILD TO MODERATE TR/   MITRAL VALVE BIOPROSTHESIS LEAFLET NORMAL,  MODERATE STENOSIS WITH NO SIG. REGURG/  MODERATE RVE   VAGINAL  HYSTERECTOMY  1982     Home Medications:  Prior to Admission medications   Medication Sig Start Date End Date Taking? Authorizing Provider  acetaminophen (TYLENOL) 500 MG tablet Take 500-1,000 mg by mouth daily as needed for moderate pain.   Yes [provider]  ALPRAZolam (XANAX) 0.5 MG tablet TAKE 1 TABLET BY MOUTH AT BEDTIME Patient taking differently: Take 0.25-0.5 mg by mouth See admin instructions. Take 0.25mg  daily and 0.5mg  at night 03/27/15  Yes Carter, Monica, DO  amLODipine-olmesartan (AZOR) 5-20 MG tablet TAKE 1 TABLET BY MOUTH EVERY DAY Patient taking differently: Take 1 tablet by mouth daily.  08/06/18  Yes Bailey Kolbe, MD  aspirin EC 81 MG tablet Take 81 mg by mouth 3 (three) times a week. Mon, Wed, Fri   Yes [provider]  B Complex Vitamins (VITAMIN B COMPLEX PO) Take 1 tablet by mouth daily.    Yes [provider]  cephALEXin (KEFLEX) 500 MG capsule Take 1 capsule (500 mg total) by mouth 3 (three) times daily. 08/31/18  Yes Jola Schmidt, MD  cholecalciferol (VITAMIN D3) 25 MCG (1000 UT) tablet Take 1,000 Units by mouth daily.  Yes [provider]  furosemide (LASIX) 40 MG tablet Take 1 tablet (40 mg total) by mouth daily as needed for fluid or edema. Reported on 01/28/2015 02/12/18  Yes Lavina Hamman, MD  KLOR-CON M20 20 MEQ tablet Take 1 tablet (20 mEq total) by mouth daily as needed (take with furosemide or lasix). Patient taking differently: Take 20 mEq by mouth at bedtime.  02/12/18  Yes Lavina Hamman, MD  omeprazole (PRILOSEC) 20 MG capsule Take 20 mg by mouth 2 (two) times a day.    Yes [provider]  OVER THE COUNTER MEDICATION Take 1 tablet by mouth 2 (two) times daily. Nutrifuron (immune supplement)   Yes [provider]  Probiotic CAPS Take 1 capsule by mouth daily.   Yes [provider]  simvastatin (ZOCOR) 10 MG tablet TAKE 1 TABLET BY MOUTH EVERY DAY AT [facility] Patient taking differently:  Take 10 mg by mouth at bedtime.  03/30/15  Yes Medina-Vargas, Monina C, NP  Vit B6-Vit B12-Omega 3 Acids (VITAMIN B PLUS+ PO) Take 1 tablet by mouth daily.   Yes [provider]    Inpatient Medications: Scheduled Meds:  acidophilus  1 capsule Oral Daily   ALPRAZolam  0.25 mg Oral Daily   And   ALPRAZolam  0.5 mg Oral QHS   amLODipine  5 mg Oral Daily   And   irbesartan  150 mg Oral Daily   apixaban  2.5 mg Oral BID   B-complex with vitamin C  1 tablet Oral Daily   cholecalciferol  1,000 Units Oral Daily   pantoprazole  40 mg Oral Daily   simvastatin  10 mg Oral QHS   sodium chloride flush  3 mL Intravenous Once   sodium zirconium cyclosilicate  10 g Oral Daily   Continuous Infusions:  cefTRIAXone (ROCEPHIN)  IV 2 g (09/05/18 1825)   PRN Meds: acetaminophen, furosemide, ondansetron **OR** ondansetron (ZOFRAN) IV  Allergies:    Allergies  Allergen Reactions   Zoloft [Sertraline Hcl]     Social History:   Social History   Socioeconomic History   Marital status: Widowed    Spouse name: Not on file   Number of children: Not on file   Years of education: Not on file   Highest education level: Not on file  Occupational History   Not on file  Social Needs   Financial resource strain: Not on file   Food insecurity    Worry: Not on file    Inability: Not on file   Transportation needs    Medical: Not on file    Non-medical: Not on file  Tobacco Use   Smoking status: Never Smoker   Smokeless tobacco: Never Used   Tobacco comment: never used tobacco  Substance and Sexual Activity   Alcohol use: No    Alcohol/week: 0.0 standard drinks   Drug use: No   Sexual activity: Not on file  Lifestyle   Physical activity    Days per week: Not on file    Minutes per session: Not on file   Stress: Not on file  Relationships   Social connections    Talks on phone: Not on file    Gets together: Not on file    Attends religious service:  Not on file    Active member of club or organization: Not on file    Attends meetings of clubs or organizations: Not on file    Relationship status: Not on file   Intimate  partner violence    Fear of current or ex partner: Not on file    Emotionally abused: Not on file    Physically abused: Not on file    Forced sexual activity: Not on file  Other Topics Concern   Not on file  Social History Narrative   Not on file    Family History:    Family History  Problem Relation Age of Onset   Cancer Mother    Heart attack Father      ROS:  Please see the history of present illness.  All other ROS reviewed and negative.     Physical Exam/Data:   Vitals:   09/05/18 2124 09/06/18 0502 09/06/18 0908 09/06/18 1007  BP: (!) 111/48 (!) 107/46 (!) 135/123 116/68  Pulse: 61 61 70 68  Resp:    17  Temp: 97.8 F (36.6 C) 98.3 F (36.8 C)  98.1 F (36.7 C)  TempSrc: Oral Oral  Oral  SpO2: 98% 97% 100%   Weight:      Height:        Intake/Output Summary (Last 24 hours) at 09/06/2018 1116 Last data filed at 09/06/2018 0727 Gross per 24 hour  Intake 51.67 ml  Output 1750 ml  Net -1698.33 ml   Last 3 Weights 09/05/2018 08/20/2018 07/27/2018  Weight (lbs) 151 lb 153 lb 155 lb  Weight (kg) 68.493 kg 69.4 kg 70.308 kg     Body mass index is 26.75 kg/m.  General:  Well nourished, well developed, in no acute distress, appears elderly and frail HEENT: normal Lymph: no adenopathy Neck: no JVD Endocrine:  No thryomegaly Vascular: No carotid bruits; FA pulses 2+ bilaterally without bruits  Cardiac:  normal S1, S2; irregular rhythm; no murmur  Lungs:  clear to auscultation bilaterally, no wheezing, rhonchi or rales  Abd: soft, nontender, no hepatomegaly  Ext: no edema Musculoskeletal:  No deformities, BUE and BLE strength normal and equal Skin: warm and dry, no peripheral stigmata of endocarditis.  The pacemaker site still has a couple of dry scabs, but there is no evidence of redness,  tenderness, swelling, drainage or any other sign of infection. Neuro:  CNs 2-12 intact, no focal abnormalities noted Psych:  Normal affect   EKG:  The EKG was personally reviewed and demonstrates: Atrial fibrillation with ventricular pacing Telemetry:  Telemetry was personally reviewed and demonstrates: Atrial fibrillation with controlled ventricular rate and occasional ventricular pacing Pacemaker interrogation performed yesterday shows normal device function, estimated generator longevity over 12 years, roughly 68% ventricular pacing, no episodes of high ventricular response, normal lead parameters  Relevant CV Studies: Pacemaker interrogation performed yesterday shows normal device function, estimated generator longevity over 12 years, roughly 68% ventricular pacing, no episodes of high ventricular response, normal lead parameters. TEE   1. The left ventricle has normal systolic function, with an ejection fraction of 60-65%. The cavity size was normal. There is mildly increased left ventricular wall thickness. No evidence of left ventricular regional wall motion abnormalities.  2. The right ventricle has normal systolc function. The cavity was normal. There is no increase in right ventricular wall thickness.  3. Large mobile left atrial thrombus on the atrial septum.  4. Left atrial size was severely dilated.  5. Right atrial size was mildly dilated.  6. Moderate mitral valve stenosis.  7. The tricuspid valve was grossly normal. Tricuspid valve regurgitation is moderate.  8. The aortic valve is tricuspid Moderate sclerosis of the aortic valve. Aortic valve regurgitation is mild by  color flow Doppler. No stenosis of the aortic valve.  9. The ascending aorta and aortic root are normal in size and structure.  SUMMARY   LVEF 60-65%, mild LVH, normal wall motion, severe LAE, 2 large left atrial masses (about 2 x 3 cm) - posteroseptal and anterolateral, which are mobile and gelatinous and  likely thrombus, bioprosthetic mitral valve - appears normally functioning, however, there is moderate stenosis (mean gradient 9 mmHg) and trivial regurgitation, RV pacer wires noted without thrombus or vegetation. No evidence of endocarditis.  Laboratory Data:  High Sensitivity Troponin:  No results for input(s): TROPONINIHS in the last 720 hours.   Cardiac EnzymesNo results for input(s): TROPONINI in the last 168 hours. No results for input(s): TROPIPOC in the last 168 hours.  Chemistry Recent Labs  Lab 09/04/18 0339 09/05/18 0329 09/06/18 0232  NA 141 138 140  K 5.0 5.5* 4.6  CL 108 107 109  CO2 27 26 24   GLUCOSE 92 94 82  BUN 22 27* 21  CREATININE 1.11* 1.17* 0.93  CALCIUM 10.3 10.3 9.9  GFRNONAA 45* 42* 55*  GFRAA 52* 49* >60  ANIONGAP 6 5 7     Recent Labs  Lab 08/31/18 1955 09/01/18 1700 09/02/18 0400  PROT 6.6 6.0* 5.1*  ALBUMIN 4.1 3.8 3.1*  AST 25 23 20   ALT 15 13 11   ALKPHOS 54 51 41  BILITOT 1.0 0.8 0.6   Hematology Recent Labs  Lab 09/03/18 0317 09/04/18 0339 09/05/18 0329 09/06/18 0232  WBC 5.2 5.4 5.3  --   RBC 3.96 3.89 3.73*  --   HGB 12.1 12.0 11.5* 11.5*  HCT 37.9 38.1 35.8* 36.1  MCV 95.7 97.9 96.0  --   MCH 30.6 30.8 30.8  --   MCHC 31.9 31.5 32.1  --   RDW 13.3 13.6 13.5  --   PLT 91* 92* 97* 96*   BNPNo results for input(s): BNP, PROBNP in the last 168 hours.  DDimer No results for input(s): DDIMER in the last 168 hours.   Radiology/Studies:  [place] Ekg Site Rite  Result Date: 09/06/2018 If [facility] image not attached, placement could not be confirmed due to current cardiac rhythm.   Assessment and Plan:   1. LA thrombus: Related to permanent atrial fibrillation and relative mitral stenosis due to mitral valve biological prosthesis with moderately elevated gradients.  Even though her left atrial appendage has been oversewn and she has not had previous embolic events.  This greatly increases the concern for possible stroke and  other embolic complications.  Anticoagulants started.  2. Anticoagulation: Eliquis dose adjusted for age and body habitus.  Increased bleeding risk due to age, unsteady gait, thrombocytopenia, but benefit appears to outweigh risk in the current setting. 3. A. Fib: With slow ventricular response and roughly 60% ventricular pacing. 4. CHF: Appears clinically euvolemic, no dyspnea (at rest).  She is quite sedentary. 5.  S/P MVR: Chronic moderate elevation in trans-prosthetic gradients (mean gradient 56mmHg on TEE yesterday, 11 mmHg on transthoracic echo performed 2014).  No evidence of endocarditis by TEE.  No significant regurgitation. 6. Bacteremia: Although endocarditis has been described with gram-negative rods and with Streptococcus agalactiae, these would be unusual organisms for prosthetic valve endocarditis.  Rapid improvement with antibiotics suggests an alternative diagnosis, such as urinary tract infection.  7. Thrombocytopenia: Relatively mild and stable.  No recent bleeding problems. 8. Pacemaker: Site appears healthy without evidence of infection and is almost completely healed following her recent generator change out.  The agents causing her bacteremia are not common causes of skin or surgical wound infections..  Device function is normal.  [facility] will sign off.   Medication Recommendations: Eliquis 2.5 mg twice daily Other recommendations (labs, testing, etc): Continue remote pacemaker downloads every 3 months Follow up as an outpatient:  Will arrange virtual visit in 3 months  For questions or updates, please contact [place] Please consult [email or web address] for contact info under     Signed, Sanda Klein, MD  09/06/2018 11:16 AM

## 2018-09-10 ENCOUNTER — Emergency Department (HOSPITAL_COMMUNITY): Payer: Medicare Other

## 2018-09-10 ENCOUNTER — Inpatient Hospital Stay (HOSPITAL_COMMUNITY)
Admission: EM | Admit: 2018-09-10 | Discharge: 2018-09-14 | DRG: 065 | Disposition: A | Discharge status: Skilled Nursing Facility | Payer: Medicare Other | Attending: Internal Medicine | Admitting: Internal Medicine

## 2018-09-10 ENCOUNTER — Other Ambulatory Visit: Payer: Self-pay

## 2018-09-10 ENCOUNTER — Encounter (HOSPITAL_COMMUNITY): Payer: Self-pay | Admitting: Emergency Medicine

## 2018-09-10 DIAGNOSIS — Z8249 Family history of ischemic heart disease and other diseases of the circulatory system: Secondary | ICD-10-CM

## 2018-09-10 DIAGNOSIS — I495 Sick sinus syndrome: Secondary | ICD-10-CM | POA: Diagnosis present

## 2018-09-10 DIAGNOSIS — D696 Thrombocytopenia, unspecified: Secondary | ICD-10-CM | POA: Diagnosis present

## 2018-09-10 DIAGNOSIS — E213 Hyperparathyroidism, unspecified: Secondary | ICD-10-CM | POA: Diagnosis present

## 2018-09-10 DIAGNOSIS — R55 Syncope and collapse: Secondary | ICD-10-CM | POA: Diagnosis not present

## 2018-09-10 DIAGNOSIS — I5032 Chronic diastolic (congestive) heart failure: Secondary | ICD-10-CM | POA: Diagnosis present

## 2018-09-10 DIAGNOSIS — R7881 Bacteremia: Secondary | ICD-10-CM | POA: Diagnosis present

## 2018-09-10 DIAGNOSIS — N179 Acute kidney failure, unspecified: Secondary | ICD-10-CM | POA: Diagnosis present

## 2018-09-10 DIAGNOSIS — E785 Hyperlipidemia, unspecified: Secondary | ICD-10-CM | POA: Diagnosis present

## 2018-09-10 DIAGNOSIS — Z7901 Long term (current) use of anticoagulants: Secondary | ICD-10-CM

## 2018-09-10 DIAGNOSIS — N183 Chronic kidney disease, stage 3 (moderate): Secondary | ICD-10-CM | POA: Diagnosis present

## 2018-09-10 DIAGNOSIS — Z20828 Contact with and (suspected) exposure to other viral communicable diseases: Secondary | ICD-10-CM | POA: Diagnosis present

## 2018-09-10 DIAGNOSIS — I130 Hypertensive heart and chronic kidney disease with heart failure and stage 1 through stage 4 chronic kidney disease, or unspecified chronic kidney disease: Secondary | ICD-10-CM | POA: Diagnosis present

## 2018-09-10 DIAGNOSIS — Z888 Allergy status to other drugs, medicaments and biological substances status: Secondary | ICD-10-CM

## 2018-09-10 DIAGNOSIS — I10 Essential (primary) hypertension: Secondary | ICD-10-CM | POA: Diagnosis present

## 2018-09-10 DIAGNOSIS — I63412 Cerebral infarction due to embolism of left middle cerebral artery: Secondary | ICD-10-CM | POA: Diagnosis not present

## 2018-09-10 DIAGNOSIS — G250 Essential tremor: Secondary | ICD-10-CM | POA: Diagnosis present

## 2018-09-10 DIAGNOSIS — R29701 NIHSS score 1: Secondary | ICD-10-CM | POA: Diagnosis present

## 2018-09-10 DIAGNOSIS — Z952 Presence of prosthetic heart valve: Secondary | ICD-10-CM

## 2018-09-10 DIAGNOSIS — F419 Anxiety disorder, unspecified: Secondary | ICD-10-CM | POA: Diagnosis present

## 2018-09-10 DIAGNOSIS — I4821 Permanent atrial fibrillation: Secondary | ICD-10-CM | POA: Diagnosis present

## 2018-09-10 DIAGNOSIS — Z85828 Personal history of other malignant neoplasm of skin: Secondary | ICD-10-CM

## 2018-09-10 DIAGNOSIS — K219 Gastro-esophageal reflux disease without esophagitis: Secondary | ICD-10-CM | POA: Diagnosis present

## 2018-09-10 DIAGNOSIS — I513 Intracardiac thrombosis, not elsewhere classified: Secondary | ICD-10-CM

## 2018-09-10 DIAGNOSIS — B9620 Unspecified Escherichia coli [E. coli] as the cause of diseases classified elsewhere: Secondary | ICD-10-CM | POA: Diagnosis present

## 2018-09-10 DIAGNOSIS — M8580 Other specified disorders of bone density and structure, unspecified site: Secondary | ICD-10-CM | POA: Diagnosis present

## 2018-09-10 DIAGNOSIS — Z950 Presence of cardiac pacemaker: Secondary | ICD-10-CM

## 2018-09-10 DIAGNOSIS — Z809 Family history of malignant neoplasm, unspecified: Secondary | ICD-10-CM

## 2018-09-10 DIAGNOSIS — K449 Diaphragmatic hernia without obstruction or gangrene: Secondary | ICD-10-CM | POA: Diagnosis present

## 2018-09-10 DIAGNOSIS — N390 Urinary tract infection, site not specified: Secondary | ICD-10-CM | POA: Diagnosis present

## 2018-09-10 DIAGNOSIS — I639 Cerebral infarction, unspecified: Secondary | ICD-10-CM | POA: Diagnosis present

## 2018-09-10 LAB — DIFFERENTIAL
Abs Immature Granulocytes: 0.02 10*3/uL (ref 0.00–0.07)
Basophils Absolute: 0.1 10*3/uL (ref 0.0–0.1)
Basophils Relative: 1 %
Eosinophils Absolute: 0.2 10*3/uL (ref 0.0–0.5)
Eosinophils Relative: 3 %
Immature Granulocytes: 0 %
Lymphocytes Relative: 23 %
Lymphs Abs: 1.4 10*3/uL (ref 0.7–4.0)
Monocytes Absolute: 0.9 10*3/uL (ref 0.1–1.0)
Monocytes Relative: 14 %
Neutro Abs: 3.6 10*3/uL (ref 1.7–7.7)
Neutrophils Relative %: 59 %

## 2018-09-10 LAB — COMPREHENSIVE METABOLIC PANEL
ALT: 24 U/L (ref 0–44)
AST: 33 U/L (ref 15–41)
Albumin: 3.5 g/dL (ref 3.5–5.0)
Alkaline Phosphatase: 51 U/L (ref 38–126)
Anion gap: 10 (ref 5–15)
BUN: 27 mg/dL — ABNORMAL HIGH (ref 8–23)
CO2: 26 mmol/L (ref 22–32)
Calcium: 10.6 mg/dL — ABNORMAL HIGH (ref 8.9–10.3)
Chloride: 104 mmol/L (ref 98–111)
Creatinine, Ser: 1.34 mg/dL — ABNORMAL HIGH (ref 0.44–1.00)
GFR calc Af Amer: 41 mL/min — ABNORMAL LOW (ref >60)
GFR calc non Af Amer: 36 mL/min — ABNORMAL LOW (ref >60)
Glucose, Bld: 132 mg/dL — ABNORMAL HIGH (ref 70–99)
Potassium: 4.5 mmol/L (ref 3.5–5.1)
Sodium: 140 mmol/L (ref 135–145)
Total Bilirubin: 0.4 mg/dL (ref 0.3–1.2)
Total Protein: 5.7 g/dL — ABNORMAL LOW (ref 6.5–8.1)

## 2018-09-10 LAB — URINALYSIS, ROUTINE W REFLEX MICROSCOPIC
Bacteria, UA: NONE SEEN
Bilirubin Urine: NEGATIVE
Glucose, UA: NEGATIVE mg/dL
Ketones, ur: NEGATIVE mg/dL
Nitrite: NEGATIVE
Protein, ur: NEGATIVE mg/dL
Specific Gravity, Urine: 1.041 — ABNORMAL HIGH (ref 1.005–1.030)
pH: 5 (ref 5.0–8.0)

## 2018-09-10 LAB — CBC
HCT: 38.2 % (ref 36.0–46.0)
Hemoglobin: 11.9 g/dL — ABNORMAL LOW (ref 12.0–15.0)
MCH: 31 pg (ref 26.0–34.0)
MCHC: 31.2 g/dL (ref 30.0–36.0)
MCV: 99.5 fL (ref 80.0–100.0)
Platelets: 111 K/uL — ABNORMAL LOW (ref 150–400)
RBC: 3.84 MIL/uL — ABNORMAL LOW (ref 3.87–5.11)
RDW: 14 % (ref 11.5–15.5)
WBC: 6.2 K/uL (ref 4.0–10.5)
nRBC: 0 % (ref 0.0–0.2)

## 2018-09-10 LAB — PROTIME-INR
INR: 1.1 (ref 0.8–1.2)
Prothrombin Time: 14 seconds (ref 11.4–15.2)

## 2018-09-10 LAB — I-STAT CHEM 8, ED
BUN: 29 mg/dL — ABNORMAL HIGH (ref 8–23)
Calcium, Ion: 1.32 mmol/L (ref 1.15–1.40)
Chloride: 103 mmol/L (ref 98–111)
Creatinine, Ser: 1.3 mg/dL — ABNORMAL HIGH (ref 0.44–1.00)
Glucose, Bld: 126 mg/dL — ABNORMAL HIGH (ref 70–99)
HCT: 38 % (ref 36.0–46.0)
Hemoglobin: 12.9 g/dL (ref 12.0–15.0)
Potassium: 4.4 mmol/L (ref 3.5–5.1)
Sodium: 139 mmol/L (ref 135–145)
TCO2: 29 mmol/L (ref 22–32)

## 2018-09-10 LAB — RAPID URINE DRUG SCREEN, HOSP PERFORMED
Amphetamines: NOT DETECTED
Barbiturates: NOT DETECTED
Benzodiazepines: POSITIVE — AB
Cocaine: NOT DETECTED
Opiates: NOT DETECTED
Tetrahydrocannabinol: NOT DETECTED

## 2018-09-10 LAB — CBG MONITORING, ED: Glucose-Capillary: 125 mg/dL — ABNORMAL HIGH (ref 70–99)

## 2018-09-10 LAB — ETHANOL: Alcohol, Ethyl (B): <10 mg/dL

## 2018-09-10 LAB — APTT: aPTT: 30 seconds (ref 24–36)

## 2018-09-10 MED ORDER — IOHEXOL 350 MG/ML SOLN
75.0000 mL | Freq: Once | INTRAVENOUS | Status: AC | PRN
  Administered 2018-09-10: 75 mL via INTRAVENOUS

## 2018-09-10 MED ORDER — ASPIRIN 81 MG PO CHEW
324.0000 mg | CHEWABLE_TABLET | Freq: Once | ORAL | Status: AC
  Administered 2018-09-11: 324 mg via ORAL
  Filled 2018-09-10: qty 4

## 2018-09-10 NOTE — ED Provider Notes (Signed)
[facility] EMERGENCY DEPARTMENT Provider Note   CSN: [id number] Arrival date & time: 09/10/18  2200     History   Chief Complaint No chief complaint on file.   HPI [name] is a 85 y.o. female.     Patient presents to the ED with a chief complaint of stroke like symptoms.  She had an episode of stuttered speech.  She was BIB as a code stroke.  Airway cleared by Dr. Gilford Raid and patient taken to CT.  Patient has recent hx of admission for bacteremia and was found to have atrial thrombus during TEE.  She is anticoagulated.  She has had a pacemaker exchange and is not a candidate for MRI.  No language interpreter was used.    Past Medical History:  Diagnosis Date   Anxiety    Arthritis    "mostly my hands" (01/29/2015)   Basal cell carcinoma of forehead    Benign essential tremor    CAP (community acquired pneumonia) 01/29/2015   CHF (congestive heart failure) ([place])    Depression    Edema of both legs 04-23-2013  pt states feet swelling (wears ted hose)   bilateral venous ablation procedures done in IR, L>R   GERD (gastroesophageal reflux disease)    H/O hiatal hernia    Hyperlipidemia    Hyperparathyroidism ([place])    MILD--  STABLE   Hypertension    Kidney stones    Osteopenia    Permanent atrial fibrillation    CARDIOLOGIST-- DR Sallyanne Kuster   Pneumonia "several times"   Presence of permanent cardiac pacemaker    LAST PACER CHECK  04-23-2013  IN [facility]   Right ureteral stone    S/P mitral valve replacement with bioprosthetic valve    04-03-2009   Schatzki's ring    Sick sinus syndrome ([place])    S/P  PACEMAKER  2009   Thrombocytopenia, immune ([place])    CHRONIC---  HEMOTOLOGIST---  DR ENNEVER   Venous insufficiency, peripheral    Wears glasses     Patient Active Problem List   Diagnosis Date Noted   Bacteremia 09/01/2018   Tachycardia-bradycardia syndrome ([place])    Atrial fibrillation with slow ventricular  response ([place])    Pacemaker battery depletion 08/16/2018   Fall 02/11/2018   GERD (gastroesophageal reflux disease) 02/11/2018   Anxiety 02/11/2018   CKD (chronic kidney disease), stage III ([place]) 02/11/2018   UTI (urinary tract infection) 02/11/2018   Need for prophylactic vaccination and inoculation against influenza 11/19/2015   Acute encephalopathy 01/31/2015   Pneumonia, community acquired 01/28/2015   PNA (pneumonia) 01/28/2015   Pacemaker 05/06/2014   Bilateral leg edema 08/20/2013   S/P mitral valve replacement with bioprosthetic valve - 29 mm Biocor 04/22/2013   Permanent atrial fibrillation 04/22/2013   Essential hypertension 04/22/2013   Hyperlipidemia 04/22/2013   Hypercalcemia 11/28/2012   Ureteral stone 08/02/2012   Acute renal failure ([place]) 08/02/2012   Thrombocytopenia ([place]) 01/20/2011   Chronic diastolic congestive heart failure ([place]) 04/04/2007   BRONCHOPNEUMONIA ORGANISM UNSPECIFIED 03/13/2007    Past Surgical History:  Procedure Laterality Date   CARDIAC CATHETERIZATION  03/27/2009   normal coronaries; grade IV Mitral Insuff.   CARDIAC CATHETERIZATION  2001  &  2006   CARDIAC VALVE REPLACEMENT     CATARACT EXTRACTION W/ INTRAOCULAR LENS  IMPLANT, BILATERAL Bilateral ~ 2012   CYSTOSCOPY W/ URETERAL STENT PLACEMENT Left 08/01/2012   Procedure: CYSTOSCOPY WITH RETROGRADE PYELOGRAM/URETERAL STENT PLACEMENT;  Surgeon: Dutch Gray, MD;  Location: [facility];  Service: Urology;  Laterality: Left;   CYSTOSCOPY WITH URETEROSCOPY Left 08/14/2012   Procedure: LEFT URETEROSCOPY WITH HOLMIUM LASER AND LEFT  STENT PLACEMENT;  Surgeon: Malka So, MD;  Location: [facility];  Service: Urology;  Laterality: Left;   CYSTOSCOPY WITH URETEROSCOPY AND STENT PLACEMENT Right 04/25/2013   Procedure: RIGHT URETEROSCOPY WITH  STENT PLACEMENT;  Surgeon: Irine Seal, MD;  Location: [facility];  Service: Urology;  Laterality: Right;    ESOPHAGOGASTRODUODENOSCOPY (EGD) WITH ESOPHAGEAL DILATION  "a couple times" (01/29/2015)   EXTRACORPOREAL SHOCK WAVE LITHOTRIPSY Left 07-26-2012   HAND SURGERY Right 05/2011   "no fracture; straightened things up   HAND SURGERY Left 2014   "no fracture; straightened things up"   HOLMIUM LASER APPLICATION Left 07/17/3352   Procedure:  HOLMIUM LASER APPLICATION;  Surgeon: Malka So, MD;  Location: [facility];  Service: Urology;  Laterality: Left;   HOLMIUM LASER APPLICATION Right 5/62/5638   Procedure: HOLMIUM LASER APPLICATION;  Surgeon: Irine Seal, MD;  Location: [facility];  Service: Urology;  Laterality: Right;   MITRAL VALVE REPLACEMENT WITH CHORDAL PRESEVATION USING BIOPROSTHETIC VALVE/  LIGATION LEFT ATRIAL APPENDAGE/  LEFT-SIDED MAZE PROCEDURE  04-03-2009  DR Ethel Rana,  SERIAL # [id number]   NM MYOCAR PERF WALL MOTION  03/01/1999   mild lateral ischemia   PERMANENT PACEMAKER INSERTION  02/26/2007   Dual Chamber Medtronic Programmed VVIR--- Generator [id number]/  [id number]    PPM GENERATOR CHANGEOUT N/A 08/20/2018   Procedure: PPM GENERATOR CHANGEOUT;  Surgeon: Sanda Klein, MD;  Location: [facility];  Service: Cardiovascular;  Laterality: N/A;   TEE WITHOUT CARDIOVERSION N/A 09/05/2018   Procedure: TRANSESOPHAGEAL ECHOCARDIOGRAM (TEE);  Surgeon: Pixie Casino, MD;  Location: [facility]COPY;  Service: Cardiovascular;  Laterality: N/A;   TRANSTHORACIC ECHOCARDIOGRAM  09-07-2012  DR CROITORU   MILD LVH/  EF 55-60%/  MILD TO MODERATE AR/   SEVERE LAE  &  RAE/  MILD TO MODERATE TR/   MITRAL VALVE BIOPROSTHESIS LEAFLET NORMAL,  MODERATE STENOSIS WITH NO SIG. REGURG/  MODERATE RVE   VAGINAL HYSTERECTOMY  1982     OB History   No obstetric history on file.      Home Medications    Prior to Admission medications   Medication Sig Start Date End Date Taking? Authorizing Provider  acetaminophen (TYLENOL) 500 MG tablet Take 500-1,000 mg by mouth  daily as needed for moderate pain.    [provider]  ALPRAZolam (XANAX) 0.5 MG tablet TAKE 1 TABLET BY MOUTH AT BEDTIME Patient taking differently: Take 0.25-0.5 mg by mouth See admin instructions. Take 0.22m daily and 0.557mat night 03/27/15   CaGildardo CrankerDO  amLODipine-olmesartan (AZOR) 5-20 MG tablet TAKE 1 TABLET BY MOUTH EVERY DAY Patient taking differently: Take 1 tablet by mouth daily.  08/06/18   Croitoru, Mihai, MD  apixaban (ELIQUIS) 2.5 MG TABS tablet Take 1 tablet (2.5 mg total) by mouth 2 (two) times daily. 09/06/18   Mikhail, MaVelta AddisonDO  B Complex Vitamins (VITAMIN B COMPLEX PO) Take 1 tablet by mouth daily.     [provider]  cefTRIAXone (ROCEPHIN) IVPB Inject 2 g into the vein daily for 23 days. Indication:  Group B strep bacteremia Last Day of Therapy:  09/29/2018 Labs - Once weekly:  CBC/D and BMP, Labs - Every other week:  ESR and CRP 09/06/18 09/29/18  MiCristal FordDO  cholecalciferol (VITAMIN D3) 25 MCG (  1000 UT) tablet Take 1,000 Units by mouth daily.    [provider]  furosemide (LASIX) 40 MG tablet Take 1 tablet (40 mg total) by mouth daily as needed for fluid or edema. Reported on 01/28/2015 02/12/18   Lavina Hamman, MD  KLOR-CON M20 20 MEQ tablet Take 1 tablet (20 mEq total) by mouth daily as needed (take with furosemide or lasix). Patient taking differently: Take 20 mEq by mouth at bedtime.  02/12/18   Lavina Hamman, MD  omeprazole (PRILOSEC) 20 MG capsule Take 20 mg by mouth 2 (two) times a day.     [provider]  OVER THE COUNTER MEDICATION Take 1 tablet by mouth 2 (two) times daily. Nutrifuron (immune supplement)    [provider]  Probiotic CAPS Take 1 capsule by mouth daily.    [provider]  simvastatin (ZOCOR) 10 MG tablet TAKE 1 TABLET BY MOUTH EVERY DAY AT [facility] Patient taking differently: Take 10 mg by mouth at bedtime.  03/30/15   Medina-Vargas, Monina C, NP  Vit B6-Vit B12-Omega 3  Acids (VITAMIN B PLUS+ PO) Take 1 tablet by mouth daily.    [provider]    Family History Family History  Problem Relation Age of Onset   Cancer Mother    Heart attack Father     Social History Social History   Tobacco Use   Smoking status: Never Smoker   Smokeless tobacco: Never Used   Tobacco comment: never used tobacco  Substance Use Topics   Alcohol use: No    Alcohol/week: 0.0 standard drinks   Drug use: No     Allergies   Zoloft [sertraline hcl]   Review of Systems Review of Systems   Physical Exam Updated Vital Signs Wt 72.2 kg    BMI 28.20 kg/m   Physical Exam   ED Treatments / Results  Labs (all labs ordered are listed, but only abnormal results are displayed) Labs Reviewed  CBC - Abnormal; Notable for the following components:      Result Value   RBC 3.84 (*)    Hemoglobin 11.9 (*)    Platelets 111 (*)    All other components within normal limits  COMPREHENSIVE METABOLIC PANEL - Abnormal; Notable for the following components:   Glucose, Bld 132 (*)    BUN 27 (*)    Creatinine, Ser 1.34 (*)    Calcium 10.6 (*)    Total Protein 5.7 (*)    GFR calc non Af Amer 36 (*)    GFR calc Af Amer 41 (*)    All other components within normal limits  CBG MONITORING, ED - Abnormal; Notable for the following components:   Glucose-Capillary 125 (*)    All other components within normal limits  I-STAT CHEM 8, ED - Abnormal; Notable for the following components:   BUN 29 (*)    Creatinine, Ser 1.30 (*)    Glucose, Bld 126 (*)    All other components within normal limits  SARS CORONAVIRUS 2 (HOSPITAL ORDER, [facility])  ETHANOL  PROTIME-INR  APTT  DIFFERENTIAL  RAPID URINE DRUG SCREEN, HOSP PERFORMED  URINALYSIS, ROUTINE W REFLEX MICROSCOPIC    EKG EKG Interpretation  Date/Time:  Monday September 10 2018 22:54:26 EDT Ventricular Rate:  119 PR Interval:    QRS Duration: 120 QT Interval:  367 QTC  Calculation: 402 R Axis:   7 Text Interpretation:  Afib/flut and V-paced complexes No further analysis attempted due  to paced rhythm Artifact in lead(s) I II III aVR aVL aVF V1 V2 V3 V4 V5 V6 VENTRICULAR PACED RHYTHM Poor data quality Confirmed by Isla Pence [phone]) on 09/10/2018 10:58:54 PM   Radiology Ct Angio Head W Or Wo Contrast  Result Date: 09/10/2018 CLINICAL DATA:  Code stroke. Initial evaluation for acute stroke, garbled speech. EXAM: CT ANGIOGRAPHY HEAD AND NECK TECHNIQUE: Multidetector CT imaging of the head and neck was performed using the standard protocol during bolus administration of intravenous contrast. Multiplanar CT image reconstructions and MIPs were obtained to evaluate the vascular anatomy. Carotid stenosis measurements (when applicable) are obtained utilizing NASCET criteria, using the distal internal carotid diameter as the denominator. CONTRAST:  106m OMNIPAQUE IOHEXOL 350 MG/ML SOLN COMPARISON:  Prior CT from 02/11/2018. FINDINGS: CT HEAD FINDINGS Brain: Diffuse prominence of the CSF containing spaces compatible generalized age-related cerebral atrophy. Mild chronic microvascular ischemic disease. No acute intracranial hemorrhage. No acute large vessel territory infarct. No mass lesion, midline shift or mass effect. No hydrocephalus. No extra-axial fluid collection. Vascular: No hyperdense vessel. Scattered vascular calcifications noted within the carotid siphons. Skull: Scalp soft tissues and calvarium within normal limits. Sinuses/Orbits: Globes orbital soft tissues demonstrate no acute finding. Paranasal sinuses are clear. No mastoid effusion. Other: None. ASPECTS (ABrownfieldsStroke Program Early CT Score) - Ganglionic level infarction (caudate, lentiform nuclei, internal capsule, insula, M1-M3 cortex): 7 - Supraganglionic infarction (M4-M6 cortex): 3 Total score (0-10 with 10 being normal): 10 Review of the MIP images confirms the above findings CTA NECK FINDINGS Aortic arch:  Visualized aortic arch of normal caliber with normal 3 vessel morphology. Mild atheromatous plaque within the aortic arch. No hemodynamically significant stenosis seen about the origin of the great vessels. Visualized subclavian arteries widely patent. Right carotid system: Right CCA tortuous proximally but widely patent to the bifurcation without stenosis. No significant atheromatous narrowing about the right bifurcation. Right ICA tortuous but widely patent to the skull base without stenosis, dissection, or occlusion. Left carotid system: Left CCA tortuous proximally but widely patent to the bifurcation without stenosis. No significant atheromatous narrowing about the left bifurcation. Left ICA tortuous but widely patent to the skull base without stenosis, dissection or occlusion. Vertebral arteries: Both vertebral arteries arise from the subclavian arteries. Vertebral arteries tortuous proximally but widely patent to the skull base without stenosis, dissection or occlusion. Skeleton: No acute osseous finding. No discrete lytic or blastic osseous lesions. Other neck: No other acute soft tissue abnormality within the neck. Subcentimeter hypodense left thyroid nodule noted, of doubtful significance. Torus palatini and torus mandibularis noted. Upper chest: Visualized upper chest demonstrates no acute finding. Left-sided pacemaker/AICD noted. Median sternotomy wires noted as well. Review of the MIP images confirms the above findings CTA HEAD FINDINGS Anterior circulation: Petrous segments widely patent. Mild atheromatous plaque within the cavernous/supraclinoid ICAs without hemodynamically significant stenosis. A1 segments widely patent. Normal anterior communicating artery. Anterior cerebral arteries widely patent to their distal aspects without stenosis. Right M1 widely patent. Normal right MCA bifurcation. Distal right MCA branches well perfused. Left M1 widely patent. Normal left MCA bifurcation. There is a focal  abrupt occlusion of a distal left M3 branch, superior division (series 8, image 94). Irregular attenuated flow seen distally. Distal left MCA branches otherwise well perfused to their distal aspects. Posterior circulation: Both vertebral arteries widely patent to the vertebrobasilar junction. Posteroinferior cerebral arteries patent bilaterally. Basilar widely patent to its distal aspect without stenosis. Superior cerebral arteries perfuse bilaterally. Both of the posterior cerebral arteries  primarily supplied via the basilar and are well perfused to their distal aspects. Venous sinuses: Patent. Anatomic variants: None significant. Review of the MIP images confirms the above findings IMPRESSION: CT HEAD IMPRESSION: 1. No acute intracranial abnormality identified. 2. Aspects equals 10/10. 3. Age-related cerebral atrophy with mild chronic microvascular ischemic disease. CTA HEAD AND NECK IMPRESSION: 1. Positive CTA with abrupt occlusion of a distal left M3 branch, superior division. Finding likely embolic in nature. Irregular attenuated flow seen beyond the level of occlusion. 2. Mild atheromatous change for age elsewhere about the aortic arch and carotid siphons without hemodynamically significant stenosis. 3. Diffuse tortuosity about the proximal major arterial vasculature of the neck, suggesting chronic underlying hypertension. These results were communicated to Dr. Rory Percy at 10:41 pmon 7/27/2020by text page via the [facility] messaging system. Electronically Signed   By: Jeannine Boga M.D.   On: 09/10/2018 22:59   Ct Angio Neck W Or Wo Contrast  Result Date: 09/10/2018 CLINICAL DATA:  Code stroke. Initial evaluation for acute stroke, garbled speech. EXAM: CT ANGIOGRAPHY HEAD AND NECK TECHNIQUE: Multidetector CT imaging of the head and neck was performed using the standard protocol during bolus administration of intravenous contrast. Multiplanar CT image reconstructions and MIPs were obtained to evaluate the  vascular anatomy. Carotid stenosis measurements (when applicable) are obtained utilizing NASCET criteria, using the distal internal carotid diameter as the denominator. CONTRAST:  17m OMNIPAQUE IOHEXOL 350 MG/ML SOLN COMPARISON:  Prior CT from 02/11/2018. FINDINGS: CT HEAD FINDINGS Brain: Diffuse prominence of the CSF containing spaces compatible generalized age-related cerebral atrophy. Mild chronic microvascular ischemic disease. No acute intracranial hemorrhage. No acute large vessel territory infarct. No mass lesion, midline shift or mass effect. No hydrocephalus. No extra-axial fluid collection. Vascular: No hyperdense vessel. Scattered vascular calcifications noted within the carotid siphons. Skull: Scalp soft tissues and calvarium within normal limits. Sinuses/Orbits: Globes orbital soft tissues demonstrate no acute finding. Paranasal sinuses are clear. No mastoid effusion. Other: None. ASPECTS (ARiver RidgeStroke Program Early CT Score) - Ganglionic level infarction (caudate, lentiform nuclei, internal capsule, insula, M1-M3 cortex): 7 - Supraganglionic infarction (M4-M6 cortex): 3 Total score (0-10 with 10 being normal): 10 Review of the MIP images confirms the above findings CTA NECK FINDINGS Aortic arch: Visualized aortic arch of normal caliber with normal 3 vessel morphology. Mild atheromatous plaque within the aortic arch. No hemodynamically significant stenosis seen about the origin of the great vessels. Visualized subclavian arteries widely patent. Right carotid system: Right CCA tortuous proximally but widely patent to the bifurcation without stenosis. No significant atheromatous narrowing about the right bifurcation. Right ICA tortuous but widely patent to the skull base without stenosis, dissection, or occlusion. Left carotid system: Left CCA tortuous proximally but widely patent to the bifurcation without stenosis. No significant atheromatous narrowing about the left bifurcation. Left ICA tortuous  but widely patent to the skull base without stenosis, dissection or occlusion. Vertebral arteries: Both vertebral arteries arise from the subclavian arteries. Vertebral arteries tortuous proximally but widely patent to the skull base without stenosis, dissection or occlusion. Skeleton: No acute osseous finding. No discrete lytic or blastic osseous lesions. Other neck: No other acute soft tissue abnormality within the neck. Subcentimeter hypodense left thyroid nodule noted, of doubtful significance. Torus palatini and torus mandibularis noted. Upper chest: Visualized upper chest demonstrates no acute finding. Left-sided pacemaker/AICD noted. Median sternotomy wires noted as well. Review of the MIP images confirms the above findings CTA HEAD FINDINGS Anterior circulation: Petrous segments widely patent. Mild atheromatous plaque within  the cavernous/supraclinoid ICAs without hemodynamically significant stenosis. A1 segments widely patent. Normal anterior communicating artery. Anterior cerebral arteries widely patent to their distal aspects without stenosis. Right M1 widely patent. Normal right MCA bifurcation. Distal right MCA branches well perfused. Left M1 widely patent. Normal left MCA bifurcation. There is a focal abrupt occlusion of a distal left M3 branch, superior division (series 8, image 94). Irregular attenuated flow seen distally. Distal left MCA branches otherwise well perfused to their distal aspects. Posterior circulation: Both vertebral arteries widely patent to the vertebrobasilar junction. Posteroinferior cerebral arteries patent bilaterally. Basilar widely patent to its distal aspect without stenosis. Superior cerebral arteries perfuse bilaterally. Both of the posterior cerebral arteries primarily supplied via the basilar and are well perfused to their distal aspects. Venous sinuses: Patent. Anatomic variants: None significant. Review of the MIP images confirms the above findings IMPRESSION: CT HEAD  IMPRESSION: 1. No acute intracranial abnormality identified. 2. Aspects equals 10/10. 3. Age-related cerebral atrophy with mild chronic microvascular ischemic disease. CTA HEAD AND NECK IMPRESSION: 1. Positive CTA with abrupt occlusion of a distal left M3 branch, superior division. Finding likely embolic in nature. Irregular attenuated flow seen beyond the level of occlusion. 2. Mild atheromatous change for age elsewhere about the aortic arch and carotid siphons without hemodynamically significant stenosis. 3. Diffuse tortuosity about the proximal major arterial vasculature of the neck, suggesting chronic underlying hypertension. These results were communicated to Dr. Rory Percy at 10:41 pmon 7/27/2020by text page via the [facility] messaging system. Electronically Signed   By: Jeannine Boga M.D.   On: 09/10/2018 22:59   Ct Head Code Stroke Wo Contrast  Result Date: 09/10/2018 CLINICAL DATA:  Code stroke. Initial evaluation for acute stroke, garbled speech. EXAM: CT ANGIOGRAPHY HEAD AND NECK TECHNIQUE: Multidetector CT imaging of the head and neck was performed using the standard protocol during bolus administration of intravenous contrast. Multiplanar CT image reconstructions and MIPs were obtained to evaluate the vascular anatomy. Carotid stenosis measurements (when applicable) are obtained utilizing NASCET criteria, using the distal internal carotid diameter as the denominator. CONTRAST:  27m OMNIPAQUE IOHEXOL 350 MG/ML SOLN COMPARISON:  Prior CT from 02/11/2018. FINDINGS: CT HEAD FINDINGS Brain: Diffuse prominence of the CSF containing spaces compatible generalized age-related cerebral atrophy. Mild chronic microvascular ischemic disease. No acute intracranial hemorrhage. No acute large vessel territory infarct. No mass lesion, midline shift or mass effect. No hydrocephalus. No extra-axial fluid collection. Vascular: No hyperdense vessel. Scattered vascular calcifications noted within the carotid siphons.  Skull: Scalp soft tissues and calvarium within normal limits. Sinuses/Orbits: Globes orbital soft tissues demonstrate no acute finding. Paranasal sinuses are clear. No mastoid effusion. Other: None. ASPECTS (APerryStroke Program Early CT Score) - Ganglionic level infarction (caudate, lentiform nuclei, internal capsule, insula, M1-M3 cortex): 7 - Supraganglionic infarction (M4-M6 cortex): 3 Total score (0-10 with 10 being normal): 10 Review of the MIP images confirms the above findings CTA NECK FINDINGS Aortic arch: Visualized aortic arch of normal caliber with normal 3 vessel morphology. Mild atheromatous plaque within the aortic arch. No hemodynamically significant stenosis seen about the origin of the great vessels. Visualized subclavian arteries widely patent. Right carotid system: Right CCA tortuous proximally but widely patent to the bifurcation without stenosis. No significant atheromatous narrowing about the right bifurcation. Right ICA tortuous but widely patent to the skull base without stenosis, dissection, or occlusion. Left carotid system: Left CCA tortuous proximally but widely patent to the bifurcation without stenosis. No significant atheromatous narrowing about the left bifurcation. Left ICA  tortuous but widely patent to the skull base without stenosis, dissection or occlusion. Vertebral arteries: Both vertebral arteries arise from the subclavian arteries. Vertebral arteries tortuous proximally but widely patent to the skull base without stenosis, dissection or occlusion. Skeleton: No acute osseous finding. No discrete lytic or blastic osseous lesions. Other neck: No other acute soft tissue abnormality within the neck. Subcentimeter hypodense left thyroid nodule noted, of doubtful significance. Torus palatini and torus mandibularis noted. Upper chest: Visualized upper chest demonstrates no acute finding. Left-sided pacemaker/AICD noted. Median sternotomy wires noted as well. Review of the MIP  images confirms the above findings CTA HEAD FINDINGS Anterior circulation: Petrous segments widely patent. Mild atheromatous plaque within the cavernous/supraclinoid ICAs without hemodynamically significant stenosis. A1 segments widely patent. Normal anterior communicating artery. Anterior cerebral arteries widely patent to their distal aspects without stenosis. Right M1 widely patent. Normal right MCA bifurcation. Distal right MCA branches well perfused. Left M1 widely patent. Normal left MCA bifurcation. There is a focal abrupt occlusion of a distal left M3 branch, superior division (series 8, image 94). Irregular attenuated flow seen distally. Distal left MCA branches otherwise well perfused to their distal aspects. Posterior circulation: Both vertebral arteries widely patent to the vertebrobasilar junction. Posteroinferior cerebral arteries patent bilaterally. Basilar widely patent to its distal aspect without stenosis. Superior cerebral arteries perfuse bilaterally. Both of the posterior cerebral arteries primarily supplied via the basilar and are well perfused to their distal aspects. Venous sinuses: Patent. Anatomic variants: None significant. Review of the MIP images confirms the above findings IMPRESSION: CT HEAD IMPRESSION: 1. No acute intracranial abnormality identified. 2. Aspects equals 10/10. 3. Age-related cerebral atrophy with mild chronic microvascular ischemic disease. CTA HEAD AND NECK IMPRESSION: 1. Positive CTA with abrupt occlusion of a distal left M3 branch, superior division. Finding likely embolic in nature. Irregular attenuated flow seen beyond the level of occlusion. 2. Mild atheromatous change for age elsewhere about the aortic arch and carotid siphons without hemodynamically significant stenosis. 3. Diffuse tortuosity about the proximal major arterial vasculature of the neck, suggesting chronic underlying hypertension. These results were communicated to Dr. Rory Percy at 10:41 pmon  7/27/2020by text page via the [facility] messaging system. Electronically Signed   By: Jeannine Boga M.D.   On: 09/10/2018 22:59    Procedures Procedures (including critical care time) CRITICAL CARE Performed by: Montine Circle  Code stroke, acute occlusion of distal M3 branch Total critical care time: 40 minutes  Critical care time was exclusive of separately billable procedures and treating other patients.  Critical care was necessary to treat or prevent imminent or life-threatening deterioration.  Critical care was time spent personally by me on the following activities: development of treatment plan with patient and/or surrogate as well as nursing, discussions with consultants, evaluation of patient's response to treatment, examination of patient, obtaining history from patient or surrogate, ordering and performing treatments and interventions, ordering and review of laboratory studies, ordering and review of radiographic studies, pulse oximetry and re-evaluation of patient's condition.  Medications Ordered in ED Medications  iohexol (OMNIPAQUE) 350 MG/ML injection 75 mL (75 mLs Intravenous Contrast Given 09/10/18 2219)     Initial Impression / Assessment and Plan / ED Course  I have reviewed the triage vital signs and the nursing notes.  Pertinent labs & imaging results that were available during my care of the patient were reviewed by me and considered in my medical decision making (see chart for details).          Airway  cleared by Dr. Gilford Raid and patient taken to CT.  Patient has recent hx of admission for bacteremia and was found to have atrial thrombus during TEE.  She is anticoagulated.  She has had a pacemaker exchange and is not a candidate for MRI. Patient seen by Dr. Rory Percy from neurology, who doubts neurologic etiology, but recommends TIA vs syncope workup and observation.  Will likely need repeat echo to evaluate atrial thrombus.  CTA positive for abrupt occlusion  of a distal left M3 branch, thought to be embolic in nature.    Pacer interrogated, shows no ventricular arrhythmias or device malfunctions.  Per Dr. Rory Percy, not a candidate for TPA.  Will need to continue current eliquis and will need medicine admission and cardiology consultation.   Appreciate Dr. Marlowe Sax for admitting the patient.  Family disappointed in what they felt was a premature discharge from the hospital, claiming that patient was still too weak to care for self.  Final Clinical Impressions(s) / ED Diagnoses   Final diagnoses:  Cerebrovascular accident (CVA) due to embolism of left middle cerebral artery [facility])    ED Discharge Orders    None       Montine Circle, PA-C 09/11/18 0002    Isla Pence, MD 09/11/18 [phone]

## 2018-09-10 NOTE — ED Notes (Signed)
Son: Kinzley Savell [phone],   Daughter: Rosalyn Gess [phone]

## 2018-09-10 NOTE — ED Triage Notes (Signed)
Patient was at home with son, patient was coming out of bathroom and heard a noise.  Patient started having garbled speech, unable to recognized common items.  This resolved by the time EMS arrival.  LSN at 2100.  During time with EMS, patient had a second episode of garbled speech and inability to recognize common items with EMS at 2130.  This also resolved at 2140.  Patient is CAOx4 with no symptoms at this time upon arrival to ED.

## 2018-09-10 NOTE — H&P (Signed)
History and Physical    [name] ERX:[id number] DOB: 01/17/1930 DOA: 09/10/2018  PCP: Lawerance Cruel, MD Patient coming from: Home  Chief Complaint: Strokelike symptoms  HPI: [name] is a 85 y.o. female with medical history significant of paroxysmal atrial fibrillation, sick sinus syndrome status post PPM, hypertension, hyperlipidemia, chronic diastolic congestive heart failure, CKD stage III, GERD, essential tremors, basal cell carcinoma of forehead presenting to the hospital for evaluation of garbled speech and difficulty recognizing common items.  Symptoms resolved by the time EMS arrived.  Last seen normal at 2100.  With EMS patient had a second episode of garbled speech and inability to recognize common items at 2130.  Symptoms resolved at 2140.  AAO x4 and asymptomatic upon arrival to the ED. history provided by patient and daughter at bedside.  Daughter states around 49 or 9:30 PM patient's son who lives with her noticed that she had slurred speech and difficulty recognizing even common items.  Symptoms resolved by the time EMS arrived.  Patient denies any headaches, blurry vision, or focal weakness or numbness.  Denies prior history of stroke.  Denies any dysuria but reports having urinary frequency.  Denies any fevers or chills.  Daughter states patient has been compliant with Eliquis since her hospital discharge.  Patient was recently admitted to the hospital from September 01, 2018 to September 06, 2018 for Streptococcus agalactiae bacteremia.  PICC line was placed.  She was placed on IV Rocephin and plan is to continue a 6-week course until September 29, 2018.  Infectious disease was consulted during this hospitalization.  Found to have atrial thrombi but no evidence of endocarditis on TEE done during this hospitalization.  She was seen by cardiology and placed on low-dose Eliquis 2.5 mg twice daily given age, fall risk, thrombocytopenia.  Aspirin was discontinued.  Lower extremity  Doppler negative for DVT.  She was also found to have a E. coli UTI on urine culture done July 17 and treated with ceftriaxone.  ED Course: Afebrile and no leukocytosis.  Hemoglobin 11.9, stable since recent hospital discharge.  Platelet count 111,000, chronically low and stable at present.  BUN 27.  Creatinine 1.3, baseline around 1.1.  Not hypoglycemic.  Blood ethanol level <10.  INR 1.1.  UDS positive for benzodiazepines.  UA with moderate leukocytes, 11-20 WBCs, and negative nitrite.  COVID-19 test pending. Head CT negative for acute finding.  CT angiogram head and neck showing abrupt occlusion of the distal left M3 branch, superior division.  Finding likely embolic in nature.  Irregular attenuated flow seen beyond the level of occlusion. Patient not able to get MRI done due to pacemaker.  Seen by Dr. Malen Gauze from neurology and thought not to be a candidate for TPA. Recommended doing a repeat head CT in 24 hours.  Recommended repeat echocardiogram, cardiology consultation and pacemaker interrogation.  Review of Systems:  All systems reviewed and apart from history of presenting illness, are negative.  Past Medical History:  Diagnosis Date  . Anxiety   . Arthritis    "mostly my hands" (01/29/2015)  . Basal cell carcinoma of forehead   . Benign essential tremor   . CAP (community acquired pneumonia) 01/29/2015  . CHF (congestive heart failure) ([place])   . Depression   . Edema of both legs 04-23-2013  pt states feet swelling (wears ted hose)   bilateral venous ablation procedures done in IR, L>R  . GERD (gastroesophageal reflux disease)   . H/O hiatal hernia   .  Hyperlipidemia   . Hyperparathyroidism ([place])    MILD--  STABLE  . Hypertension   . Kidney stones   . Osteopenia   . Permanent atrial fibrillation    CARDIOLOGIST-- DR YYTKPTWS  . Pneumonia "several times"  . Presence of permanent cardiac pacemaker    LAST PACER CHECK  04-23-2013  IN [facility]  . Right ureteral stone   . S/P mitral  valve replacement with bioprosthetic valve    04-03-2009  . Schatzki's ring   . Sick sinus syndrome ([place])    S/P  PACEMAKER  2009  . Thrombocytopenia, immune ([place])    CHRONIC---  HEMOTOLOGIST---  DR ENNEVER  . Venous insufficiency, peripheral   . Wears glasses     Past Surgical History:  Procedure Laterality Date  . CARDIAC CATHETERIZATION  03/27/2009   normal coronaries; grade IV Mitral Insuff.  Marland Kitchen CARDIAC CATHETERIZATION  2001  &  2006  . CARDIAC VALVE REPLACEMENT    . CATARACT EXTRACTION W/ INTRAOCULAR LENS  IMPLANT, BILATERAL Bilateral ~ 2012  . CYSTOSCOPY W/ URETERAL STENT PLACEMENT Left 08/01/2012   Procedure: CYSTOSCOPY WITH RETROGRADE PYELOGRAM/URETERAL STENT PLACEMENT;  Surgeon: Dutch Gray, MD;  Location: [facility];  Service: Urology;  Laterality: Left;  . CYSTOSCOPY WITH URETEROSCOPY Left 08/14/2012   Procedure: LEFT URETEROSCOPY WITH HOLMIUM LASER AND LEFT  STENT PLACEMENT;  Surgeon: Malka So, MD;  Location: [facility];  Service: Urology;  Laterality: Left;  . CYSTOSCOPY WITH URETEROSCOPY AND STENT PLACEMENT Right 04/25/2013   Procedure: RIGHT URETEROSCOPY WITH  STENT PLACEMENT;  Surgeon: Irine Seal, MD;  Location: [facility];  Service: Urology;  Laterality: Right;  . ESOPHAGOGASTRODUODENOSCOPY (EGD) WITH ESOPHAGEAL DILATION  "a couple times" (01/29/2015)  . EXTRACORPOREAL SHOCK WAVE LITHOTRIPSY Left 07-26-2012  . HAND SURGERY Right 05/2011   "no fracture; straightened things up  . HAND SURGERY Left 2014   "no fracture; straightened things up"  . HOLMIUM LASER APPLICATION Left 06/19/8125   Procedure:  HOLMIUM LASER APPLICATION;  Surgeon: Malka So, MD;  Location: [facility];  Service: Urology;  Laterality: Left;  . HOLMIUM LASER APPLICATION Right 07/01/15   Procedure: HOLMIUM LASER APPLICATION;  Surgeon: Irine Seal, MD;  Location: [facility];  Service: Urology;  Laterality: Right;  . MITRAL VALVE REPLACEMENT WITH CHORDAL PRESEVATION USING BIOPROSTHETIC  VALVE/  LIGATION LEFT ATRIAL APPENDAGE/  LEFT-SIDED MAZE PROCEDURE  04-03-2009  DR Ethel Rana,  SERIAL # [id number]  . NM MYOCAR PERF WALL MOTION  03/01/1999   mild lateral ischemia  . PERMANENT PACEMAKER INSERTION  02/26/2007   Dual Chamber [employer] Programmed VVIR--- Generator ADDR01/  #[id number]   . PPM GENERATOR CHANGEOUT N/A 08/20/2018   Procedure: PPM GENERATOR CHANGEOUT;  Surgeon: Sanda Klein, MD;  Location: [facility];  Service: Cardiovascular;  Laterality: N/A;  . TEE WITHOUT CARDIOVERSION N/A 09/05/2018   Procedure: TRANSESOPHAGEAL ECHOCARDIOGRAM (TEE);  Surgeon: Pixie Casino, MD;  Location: [facility];  Service: Cardiovascular;  Laterality: N/A;  . TRANSTHORACIC ECHOCARDIOGRAM  09-07-2012  DR CROITORU   MILD LVH/  EF 55-60%/  MILD TO MODERATE AR/   SEVERE LAE  &  RAE/  MILD TO MODERATE TR/   MITRAL VALVE BIOPROSTHESIS LEAFLET NORMAL,  MODERATE STENOSIS WITH NO SIG. REGURG/  MODERATE RVE  . VAGINAL HYSTERECTOMY  1982     reports that she has never smoked. She has never used smokeless tobacco. She reports that she does not drink alcohol or use drugs.  Allergies  Allergen Reactions  . Zoloft [Sertraline Hcl]     Family History  Problem Relation Age of Onset  . Cancer Mother   . Heart attack Father     Prior to Admission medications   Medication Sig Start Date End Date Taking? Authorizing Provider  acetaminophen (TYLENOL) 500 MG tablet Take 500-1,000 mg by mouth daily as needed for moderate pain.    [provider]  ALPRAZolam (XANAX) 0.5 MG tablet TAKE 1 TABLET BY MOUTH AT BEDTIME Patient taking differently: Take 0.25-0.5 mg by mouth See admin instructions. Take 0.64m daily and 0.566mat night 03/27/15   CaGildardo CrankerDO  amLODipine-olmesartan (AZOR) 5-20 MG tablet TAKE 1 TABLET BY MOUTH EVERY DAY Patient taking differently: Take 1 tablet by mouth daily.  08/06/18   Croitoru, Mihai, MD  apixaban (ELIQUIS) 2.5 MG TABS tablet Take 1 tablet  (2.5 mg total) by mouth 2 (two) times daily. 09/06/18   Mikhail, MaVelta AddisonDO  B Complex Vitamins (VITAMIN B COMPLEX PO) Take 1 tablet by mouth daily.     [provider]  cefTRIAXone (ROCEPHIN) IVPB Inject 2 g into the vein daily for 23 days. Indication:  Group B strep bacteremia Last Day of Therapy:  09/29/2018 Labs - Once weekly:  CBC/D and BMP, Labs - Every other week:  ESR and CRP 09/06/18 09/29/18  MiCristal FordDO  cholecalciferol (VITAMIN D3) 25 MCG (1000 UT) tablet Take 1,000 Units by mouth daily.    [provider]  furosemide (LASIX) 40 MG tablet Take 1 tablet (40 mg total) by mouth daily as needed for fluid or edema. Reported on 01/28/2015 02/12/18   PaLavina HammanMD  KLOR-CON M20 20 MEQ tablet Take 1 tablet (20 mEq total) by mouth daily as needed (take with furosemide or lasix). Patient taking differently: Take 20 mEq by mouth at bedtime.  02/12/18   PaLavina HammanMD  omeprazole (PRILOSEC) 20 MG capsule Take 20 mg by mouth 2 (two) times a day.     [provider]  OVER THE COUNTER MEDICATION Take 1 tablet by mouth 2 (two) times daily. Nutrifuron (immune supplement)    [provider]  Probiotic CAPS Take 1 capsule by mouth daily.    [provider]  simvastatin (ZOCOR) 10 MG tablet TAKE 1 TABLET BY MOUTH [facility] taking differently: Take 10 mg by mouth at bedtime.  03/30/15   Medina-Vargas, Monina C, NP  Vit B6-Vit B12-Omega 3 Acids (VITAMIN B PLUS+ PO) Take 1 tablet by mouth daily.    [provider]    Physical Exam: Vitals:   09/11/18 0015 09/11/18 0115 09/11/18 0315 09/11/18 0355  BP: 106/72 134/69 (!) 113/57 (!) 102/53  Pulse: 60 93 61 75  Resp: _0 Temp:  97.6 F (36.4 C) 98.2 F (36.8 C) 98.2 F (36.8 C)  TempSrc:  Oral Oral Oral  SpO2: 96% 98% 93% 98%  Weight:        Physical Exam  Constitutional: She is oriented to person, place, and time. No distress.  Frail elderly female   HENT:  Head: Normocephalic.  Eyes: Pupils are equal, round, and reactive to light. EOM are normal. Right eye exhibits no discharge. Left eye exhibits no discharge.  Neck: Neck supple.  Cardiovascular: Normal rate, regular rhythm and intact distal pulses.  Pulmonary/Chest: Effort normal and breath sounds normal. No respiratory distress. She has no wheezes. She has no rales.  Abdominal: Soft. Bowel sounds  are normal. She exhibits no distension. There is no abdominal tenderness. There is no guarding.  Musculoskeletal:        General: No edema.  Neurological: She is alert and oriented to person, place, and time.  Mildly dysarthric, baseline per patient Strength 5 out of 5 in bilateral upper and lower extremities.  Sensation to light touch intact throughout.  Skin: Skin is warm and dry. She is not diaphoretic.    Labs on Admission: I have personally reviewed following labs and imaging studies  CBC: Recent Labs  Lab 09/05/18 0329 09/06/18 0232 09/10/18 2205 09/10/18 2207  WBC 5.3  --  6.2  --   NEUTROABS  --   --  3.6  --   HGB 11.5* 11.5* 11.9* 12.9  HCT 35.8* 36.1 38.2 38.0  MCV 96.0  --  99.5  --   PLT 97* 96* 111*  --    Basic Metabolic Panel: Recent Labs  Lab 09/05/18 0329 09/06/18 0232 09/10/18 2205 09/10/18 2207  NA 138 140 140 139  K 5.5* 4.6 4.5 4.4  CL 107 109 104 103  CO2 _0 --   GLUCOSE 94 82 132* 126*  BUN 27* 21 27* 29*  CREATININE 1.17* 0.93 1.34* 1.30*  CALCIUM 10.3 9.9 10.6*  --    GFR: Estimated Creatinine Clearance: 29 mL/min (A) (by C-G formula based on SCr of 1.3 mg/dL (H)). Liver Function Tests: Recent Labs  Lab 09/10/18 2205  AST 33  ALT 24  ALKPHOS 51  BILITOT 0.4  PROT 5.7*  ALBUMIN 3.5   No results for input(s): LIPASE, AMYLASE in the last 168 hours. No results for input(s): AMMONIA in the last 168 hours. Coagulation Profile: Recent Labs  Lab 09/10/18 2205  INR 1.1   Cardiac Enzymes: No results for input(s): CKTOTAL,  CKMB, CKMBINDEX, TROPONINI in the last 168 hours. BNP (last 3 results) No results for input(s): PROBNP in the last 8760 hours. HbA1C: No results for input(s): HGBA1C in the last 72 hours. CBG: Recent Labs  Lab 09/10/18 2203  GLUCAP 125*   Lipid Profile: No results for input(s): CHOL, HDL, LDLCALC, TRIG, CHOLHDL, LDLDIRECT in the last 72 hours. Thyroid Function Tests: No results for input(s): TSH, T4TOTAL, FREET4, T3FREE, THYROIDAB in the last 72 hours. Anemia Panel: No results for input(s): VITAMINB12, FOLATE, FERRITIN, TIBC, IRON, RETICCTPCT in the last 72 hours. Urine analysis:    Component Value Date/Time   COLORURINE YELLOW 09/10/2018 2308   APPEARANCEUR HAZY (A) 09/10/2018 2308   LABSPEC 1.041 (H) 09/10/2018 2308   PHURINE 5.0 09/10/2018 2308   GLUCOSEU NEGATIVE 09/10/2018 2308   HGBUR SMALL (A) 09/10/2018 2308   BILIRUBINUR NEGATIVE 09/10/2018 2308   KETONESUR NEGATIVE 09/10/2018 2308   PROTEINUR NEGATIVE 09/10/2018 2308   UROBILINOGEN 0.2 04/17/2009 1759   NITRITE NEGATIVE 09/10/2018 2308   LEUKOCYTESUR MODERATE (A) 09/10/2018 2308    Radiological Exams on Admission: Ct Angio Head W Or Wo Contrast  Result Date: 09/10/2018 CLINICAL DATA:  Code stroke. Initial evaluation for acute stroke, garbled speech. EXAM: CT ANGIOGRAPHY HEAD AND NECK TECHNIQUE: Multidetector CT imaging of the head and neck was performed using the standard protocol during bolus administration of intravenous contrast. Multiplanar CT image reconstructions and MIPs were obtained to evaluate the vascular anatomy. Carotid stenosis measurements (when applicable) are obtained utilizing NASCET criteria, using the distal internal carotid diameter as the denominator. CONTRAST:  23m OMNIPAQUE IOHEXOL 350 MG/ML SOLN COMPARISON:  Prior CT from 02/11/2018. FINDINGS: CT HEAD  FINDINGS Brain: Diffuse prominence of the CSF containing spaces compatible generalized age-related cerebral atrophy. Mild chronic microvascular  ischemic disease. No acute intracranial hemorrhage. No acute large vessel territory infarct. No mass lesion, midline shift or mass effect. No hydrocephalus. No extra-axial fluid collection. Vascular: No hyperdense vessel. Scattered vascular calcifications noted within the carotid siphons. Skull: Scalp soft tissues and calvarium within normal limits. Sinuses/Orbits: Globes orbital soft tissues demonstrate no acute finding. Paranasal sinuses are clear. No mastoid effusion. Other: None. ASPECTS ([place] Stroke Program Early CT Score) - Ganglionic level infarction (caudate, lentiform nuclei, internal capsule, insula, M1-M3 cortex): 7 - Supraganglionic infarction (M4-M6 cortex): 3 Total score (0-10 with 10 being normal): 10 Review of the MIP images confirms the above findings CTA NECK FINDINGS Aortic arch: Visualized aortic arch of normal caliber with normal 3 vessel morphology. Mild atheromatous plaque within the aortic arch. No hemodynamically significant stenosis seen about the origin of the great vessels. Visualized subclavian arteries widely patent. Right carotid system: Right CCA tortuous proximally but widely patent to the bifurcation without stenosis. No significant atheromatous narrowing about the right bifurcation. Right ICA tortuous but widely patent to the skull base without stenosis, dissection, or occlusion. Left carotid system: Left CCA tortuous proximally but widely patent to the bifurcation without stenosis. No significant atheromatous narrowing about the left bifurcation. Left ICA tortuous but widely patent to the skull base without stenosis, dissection or occlusion. Vertebral arteries: Both vertebral arteries arise from the subclavian arteries. Vertebral arteries tortuous proximally but widely patent to the skull base without stenosis, dissection or occlusion. Skeleton: No acute osseous finding. No discrete lytic or blastic osseous lesions. Other neck: No other acute soft tissue abnormality within the  neck. Subcentimeter hypodense left thyroid nodule noted, of doubtful significance. Torus palatini and torus mandibularis noted. Upper chest: Visualized upper chest demonstrates no acute finding. Left-sided pacemaker/AICD noted. Median sternotomy wires noted as well. Review of the MIP images confirms the above findings CTA HEAD FINDINGS Anterior circulation: Petrous segments widely patent. Mild atheromatous plaque within the cavernous/supraclinoid ICAs without hemodynamically significant stenosis. A1 segments widely patent. Normal anterior communicating artery. Anterior cerebral arteries widely patent to their distal aspects without stenosis. Right M1 widely patent. Normal right MCA bifurcation. Distal right MCA branches well perfused. Left M1 widely patent. Normal left MCA bifurcation. There is a focal abrupt occlusion of a distal left M3 branch, superior division (series 8, image 94). Irregular attenuated flow seen distally. Distal left MCA branches otherwise well perfused to their distal aspects. Posterior circulation: Both vertebral arteries widely patent to the vertebrobasilar junction. Posteroinferior cerebral arteries patent bilaterally. Basilar widely patent to its distal aspect without stenosis. Superior cerebral arteries perfuse bilaterally. Both of the posterior cerebral arteries primarily supplied via the basilar and are well perfused to their distal aspects. Venous sinuses: Patent. Anatomic variants: None significant. Review of the MIP images confirms the above findings IMPRESSION: CT HEAD IMPRESSION: 1. No acute intracranial abnormality identified. 2. Aspects equals 10/10. 3. Age-related cerebral atrophy with mild chronic microvascular ischemic disease. CTA HEAD AND NECK IMPRESSION: 1. Positive CTA with abrupt occlusion of a distal left M3 branch, superior division. Finding likely embolic in nature. Irregular attenuated flow seen beyond the level of occlusion. 2. Mild atheromatous change for age  elsewhere about the aortic arch and carotid siphons without hemodynamically significant stenosis. 3. Diffuse tortuosity about the proximal major arterial vasculature of the neck, suggesting chronic underlying hypertension. These results were communicated to Dr. Rory Percy at 10:41 pmon 7/27/2020by text page via  the [employer] system. Electronically Signed   By: Jeannine Boga M.D.   On: 09/10/2018 22:59   Ct Angio Neck W Or Wo Contrast  Result Date: 09/10/2018 CLINICAL DATA:  Code stroke. Initial evaluation for acute stroke, garbled speech. EXAM: CT ANGIOGRAPHY HEAD AND NECK TECHNIQUE: Multidetector CT imaging of the head and neck was performed using the standard protocol during bolus administration of intravenous contrast. Multiplanar CT image reconstructions and MIPs were obtained to evaluate the vascular anatomy. Carotid stenosis measurements (when applicable) are obtained utilizing NASCET criteria, using the distal internal carotid diameter as the denominator. CONTRAST:  59m OMNIPAQUE IOHEXOL 350 MG/ML SOLN COMPARISON:  Prior CT from 02/11/2018. FINDINGS: CT HEAD FINDINGS Brain: Diffuse prominence of the CSF containing spaces compatible generalized age-related cerebral atrophy. Mild chronic microvascular ischemic disease. No acute intracranial hemorrhage. No acute large vessel territory infarct. No mass lesion, midline shift or mass effect. No hydrocephalus. No extra-axial fluid collection. Vascular: No hyperdense vessel. Scattered vascular calcifications noted within the carotid siphons. Skull: Scalp soft tissues and calvarium within normal limits. Sinuses/Orbits: Globes orbital soft tissues demonstrate no acute finding. Paranasal sinuses are clear. No mastoid effusion. Other: None. ASPECTS ([place] Early CT Score) - Ganglionic level infarction (caudate, lentiform nuclei, internal capsule, insula, M1-M3 cortex): 7 - Supraganglionic infarction (M4-M6 cortex): 3 Total score (0-10 with 10  being normal): 10 Review of the MIP images confirms the above findings CTA NECK FINDINGS Aortic arch: Visualized aortic arch of normal caliber with normal 3 vessel morphology. Mild atheromatous plaque within the aortic arch. No hemodynamically significant stenosis seen about the origin of the great vessels. Visualized subclavian arteries widely patent. Right carotid system: Right CCA tortuous proximally but widely patent to the bifurcation without stenosis. No significant atheromatous narrowing about the right bifurcation. Right ICA tortuous but widely patent to the skull base without stenosis, dissection, or occlusion. Left carotid system: Left CCA tortuous proximally but widely patent to the bifurcation without stenosis. No significant atheromatous narrowing about the left bifurcation. Left ICA tortuous but widely patent to the skull base without stenosis, dissection or occlusion. Vertebral arteries: Both vertebral arteries arise from the subclavian arteries. Vertebral arteries tortuous proximally but widely patent to the skull base without stenosis, dissection or occlusion. Skeleton: No acute osseous finding. No discrete lytic or blastic osseous lesions. Other neck: No other acute soft tissue abnormality within the neck. Subcentimeter hypodense left thyroid nodule noted, of doubtful significance. Torus palatini and torus mandibularis noted. Upper chest: Visualized upper chest demonstrates no acute finding. Left-sided pacemaker/AICD noted. Median sternotomy wires noted as well. Review of the MIP images confirms the above findings CTA HEAD FINDINGS Anterior circulation: Petrous segments widely patent. Mild atheromatous plaque within the cavernous/supraclinoid ICAs without hemodynamically significant stenosis. A1 segments widely patent. Normal anterior communicating artery. Anterior cerebral arteries widely patent to their distal aspects without stenosis. Right M1 widely patent. Normal right MCA bifurcation. Distal  right MCA branches well perfused. Left M1 widely patent. Normal left MCA bifurcation. There is a focal abrupt occlusion of a distal left M3 branch, superior division (series 8, image 94). Irregular attenuated flow seen distally. Distal left MCA branches otherwise well perfused to their distal aspects. Posterior circulation: Both vertebral arteries widely patent to the vertebrobasilar junction. Posteroinferior cerebral arteries patent bilaterally. Basilar widely patent to its distal aspect without stenosis. Superior cerebral arteries perfuse bilaterally. Both of the posterior cerebral arteries primarily supplied via the basilar and are well perfused to their distal aspects. Venous sinuses: Patent.  Anatomic variants: None significant. Review of the MIP images confirms the above findings IMPRESSION: CT HEAD IMPRESSION: 1. No acute intracranial abnormality identified. 2. Aspects equals 10/10. 3. Age-related cerebral atrophy with mild chronic microvascular ischemic disease. CTA HEAD AND NECK IMPRESSION: 1. Positive CTA with abrupt occlusion of a distal left M3 branch, superior division. Finding likely embolic in nature. Irregular attenuated flow seen beyond the level of occlusion. 2. Mild atheromatous change for age elsewhere about the aortic arch and carotid siphons without hemodynamically significant stenosis. 3. Diffuse tortuosity about the proximal major arterial vasculature of the neck, suggesting chronic underlying hypertension. These results were communicated to Dr. Rory Percy at 10:41 pmon 7/27/2020by text page via the [facility] messaging system. Electronically Signed   By: Jeannine Boga M.D.   On: 09/10/2018 22:59   Ct Head Code Stroke Wo Contrast  Result Date: 09/10/2018 CLINICAL DATA:  Code stroke. Initial evaluation for acute stroke, garbled speech. EXAM: CT ANGIOGRAPHY HEAD AND NECK TECHNIQUE: Multidetector CT imaging of the head and neck was performed using the standard protocol during bolus  administration of intravenous contrast. Multiplanar CT image reconstructions and MIPs were obtained to evaluate the vascular anatomy. Carotid stenosis measurements (when applicable) are obtained utilizing NASCET criteria, using the distal internal carotid diameter as the denominator. CONTRAST:  27m OMNIPAQUE IOHEXOL 350 MG/ML SOLN COMPARISON:  Prior CT from 02/11/2018. FINDINGS: CT HEAD FINDINGS Brain: Diffuse prominence of the CSF containing spaces compatible generalized age-related cerebral atrophy. Mild chronic microvascular ischemic disease. No acute intracranial hemorrhage. No acute large vessel territory infarct. No mass lesion, midline shift or mass effect. No hydrocephalus. No extra-axial fluid collection. Vascular: No hyperdense vessel. Scattered vascular calcifications noted within the carotid siphons. Skull: Scalp soft tissues and calvarium within normal limits. Sinuses/Orbits: Globes orbital soft tissues demonstrate no acute finding. Paranasal sinuses are clear. No mastoid effusion. Other: None. ASPECTS ([place]Stroke Program Early CT Score) - Ganglionic level infarction (caudate, lentiform nuclei, internal capsule, insula, M1-M3 cortex): 7 - Supraganglionic infarction (M4-M6 cortex): 3 Total score (0-10 with 10 being normal): 10 Review of the MIP images confirms the above findings CTA NECK FINDINGS Aortic arch: Visualized aortic arch of normal caliber with normal 3 vessel morphology. Mild atheromatous plaque within the aortic arch. No hemodynamically significant stenosis seen about the origin of the great vessels. Visualized subclavian arteries widely patent. Right carotid system: Right CCA tortuous proximally but widely patent to the bifurcation without stenosis. No significant atheromatous narrowing about the right bifurcation. Right ICA tortuous but widely patent to the skull base without stenosis, dissection, or occlusion. Left carotid system: Left CCA tortuous proximally but widely patent to the  bifurcation without stenosis. No significant atheromatous narrowing about the left bifurcation. Left ICA tortuous but widely patent to the skull base without stenosis, dissection or occlusion. Vertebral arteries: Both vertebral arteries arise from the subclavian arteries. Vertebral arteries tortuous proximally but widely patent to the skull base without stenosis, dissection or occlusion. Skeleton: No acute osseous finding. No discrete lytic or blastic osseous lesions. Other neck: No other acute soft tissue abnormality within the neck. Subcentimeter hypodense left thyroid nodule noted, of doubtful significance. Torus palatini and torus mandibularis noted. Upper chest: Visualized upper chest demonstrates no acute finding. Left-sided pacemaker/AICD noted. Median sternotomy wires noted as well. Review of the MIP images confirms the above findings CTA HEAD FINDINGS Anterior circulation: Petrous segments widely patent. Mild atheromatous plaque within the cavernous/supraclinoid ICAs without hemodynamically significant stenosis. A1 segments widely patent. Normal anterior communicating artery. Anterior cerebral  arteries widely patent to their distal aspects without stenosis. Right M1 widely patent. Normal right MCA bifurcation. Distal right MCA branches well perfused. Left M1 widely patent. Normal left MCA bifurcation. There is a focal abrupt occlusion of a distal left M3 branch, superior division (series 8, image 94). Irregular attenuated flow seen distally. Distal left MCA branches otherwise well perfused to their distal aspects. Posterior circulation: Both vertebral arteries widely patent to the vertebrobasilar junction. Posteroinferior cerebral arteries patent bilaterally. Basilar widely patent to its distal aspect without stenosis. Superior cerebral arteries perfuse bilaterally. Both of the posterior cerebral arteries primarily supplied via the basilar and are well perfused to their distal aspects. Venous sinuses:  Patent. Anatomic variants: None significant. Review of the MIP images confirms the above findings IMPRESSION: CT HEAD IMPRESSION: 1. No acute intracranial abnormality identified. 2. Aspects equals 10/10. 3. Age-related cerebral atrophy with mild chronic microvascular ischemic disease. CTA HEAD AND NECK IMPRESSION: 1. Positive CTA with abrupt occlusion of a distal left M3 branch, superior division. Finding likely embolic in nature. Irregular attenuated flow seen beyond the level of occlusion. 2. Mild atheromatous change for age elsewhere about the aortic arch and carotid siphons without hemodynamically significant stenosis. 3. Diffuse tortuosity about the proximal major arterial vasculature of the neck, suggesting chronic underlying hypertension. These results were communicated to Dr. Rory Percy at 10:41 pmon 7/27/2020by text page via the [facility] messaging system. Electronically Signed   By: Jeannine Boga M.D.   On: 09/10/2018 22:59    EKG: Independently reviewed.  Paced rhythm, poor quality study with artifact in multiple leads.  Repeat EKG ordered and pending.  Assessment/Plan Principal Problem:   CVA (cerebral vascular accident) ([place]) Active Problems:   Essential hypertension   Bacteremia   Thrombus of atrial appendage   E. coli UTI   Suspected embolic CVA versus TIA Head CT negative for acute finding.  CT angiogram head and neck showing abrupt occlusion of the distal left M3 branch, superior division.  Finding likely embolic in nature given presence of atrial thrombi on TEE done 7/22..  Irregular attenuated flow seen beyond the level of occlusion. Patient not able to get MRI done due to pacemaker.  Seen by Dr. Malen Gauze from neurology and thought not to be a candidate for TPA. Recommended doing a repeat head CT in 24 hours.  Recommended repeat echocardiogram, cardiology consultation and pacemaker interrogation.  Pacemaker interrogation showing atrial sensitivity off and no arrhythmias/high-grade  episodes detected since last interrogation on September 05, 2018. -Telemetry monitoring -2D echocardiogram -Hemoglobin A1c, fasting lipid panel -Aspirin 325 milligrams given in the ED -Continue home Zocor -Continue Eliquis for anticoagulation -Repeat head CT in 24 hours -Consult cardiology in a.m. -Pacemaker interrogation -Frequent neurochecks -PT, OT, speech therapy. -N.p.o. until cleared by bedside swallow evaluation or formal speech evaluation  Streptococcus agalactiae bacteremia Patient was recently admitted to the hospital from September 01, 2018 to September 06, 2018 for Streptococcus agalactiae bacteremia.  PICC line was placed.  She was placed on IV Rocephin and plan is to continue a 6-week course until September 29, 2018. -Continue IV ceftriaxone  -Ensure ID follow-up  Atrial thrombi During recent hospitalization, found to have atrial thrombi but no evidence of endocarditis on TEE. She was seen by cardiology and placed on low-dose Eliquis 2.5 mg twice daily given age, fall risk, thrombocytopenia.  Aspirin was discontinued.  Lower extremity Dopplers done 7/23 negative for DVT.   -Continue Eliquis for anticoagulation -Repeat echocardiogram -Consult cardiology in a.m.  E. coli UTI She  was also found to have a E. coli UTI resistant to Cipro but sensitive to other medications on urine culture done July 17 and treated with ceftriaxone.  Continues to complain of urinary frequency.  Repeat UA showing moderate leukocytes, 11-20 WBCs, and negative nitrite. -Continue ceftriaxone -Urine culture  Paroxysmal atrial fibrillation Has a pacemaker, currently rate controlled. -Continue Eliquis  Hypertension -Continue home amlodipine  Chronic diastolic congestive heart failure Currently euvolemic. -Hold Lasix -Hold ARB given mild elevation in creatinine from baseline  Anxiety -Continue home Xanax  DVT prophylaxis: Eliquis Code Status: Full code.  Discussed with patient and daughter. Family  Communication: Daughter at bedside. Disposition Plan: Anticipate discharge after clinical improvement. Consults called: Neurology Admission status: It is my clinical opinion that admission to [place] is reasonable and necessary in this 85 y.o. female . presenting with suspected embolic CVA versus TIA.  CT angiogram with finding concerning for embolic CVA.  Will need repeat head CT in 24 hours and stroke work-up.  Given the aforementioned, the predictability of an adverse outcome is felt to be significant. I expect that the patient will require at least 2 midnights in the hospital to treat this condition.   The medical decision making on this patient was of high complexity and the patient is at high risk for clinical deterioration, therefore this is a level 3 visit.  Shela Leff MD [facility] Pager [phone]  If 7PM-7AM, please contact night-coverage [email or web address] Password [facility]  09/11/2018, 5:13 AM

## 2018-09-10 NOTE — Consult Note (Addendum)
Neurology Consultation  Reason for Consult: Code stroke-garbled speech Referring Physician: Dr. Gilford Raid  CC: Code stroke for garbled speech  History is obtained from: Patient, EMS, chart, patient's daughter who was in the ER.  HPI: [name] is a 85 y.o. female past medical history of congestive heart failure, depression, hyperlipidemia, hypertension, hyperparathyroidism, essential tremor, arthritis, sick sinus syndrome status post pacemaker placement in 2009 and replacement of the pacemaker a week ago, brought in as a code stroke for garbled speech. She was at home and found down with an episode of garbled speech by her son.  Her last known normal time was around 9:10 PM on 09/10/2018.  Her speech started to improve on the way here.  Her EKG initially was nearly normal with intermittent pacing but she had another episode of garbled speech and her EKG was changed to completely pacing at that time. On my examination, NIH stroke scale 1 for dysarthria, that the patient says is baseline.  Also had resting and intention tremor in both arms worse on the right than left. Patient was recently admitted for bacteremia and found to have atrial thrombus during a TEE.  Anticoagulated with Eliquis.  Last dose today.  According to the family, prior to discharge, patient was very frail and the family noticed that her tremors had been becoming more than she had been having more difficulty ambulating and taking care of herself.  She was discharged with instructions for home antibiotics after PICC line was placed.  The family remains concerned that she is not stable enough to be discharged home at this time.  Patient denies any fevers chills.  She denies any cough shortness of breath.  She does report occasional palpitations which have been more over the last few weeks.   LKW: 9:10 PM on 09/10/2018 tpa given?: no, low NIH stroke scale, likely syncopal episode and not a stroke Premorbid modified Rankin  scale (mRS): 3   ROS:  ROS was performed and is negative except as noted in the HPI.   Past Medical History:  Diagnosis Date  . Anxiety   . Arthritis    "mostly my hands" (01/29/2015)  . Basal cell carcinoma of forehead   . Benign essential tremor   . CAP (community acquired pneumonia) 01/29/2015  . CHF (congestive heart failure) ([place])   . Depression   . Edema of both legs 04-23-2013  pt states feet swelling (wears ted hose)   bilateral venous ablation procedures done in IR, L>R  . GERD (gastroesophageal reflux disease)   . H/O hiatal hernia   . Hyperlipidemia   . Hyperparathyroidism ([place])    MILD--  STABLE  . Hypertension   . Kidney stones   . Osteopenia   . Permanent atrial fibrillation    CARDIOLOGIST-- DR WYOVZCHY  . Pneumonia "several times"  . Presence of permanent cardiac pacemaker    LAST PACER CHECK  04-23-2013  IN [facility]  . Right ureteral stone   . S/P mitral valve replacement with bioprosthetic valve    04-03-2009  . Schatzki's ring   . Sick sinus syndrome ([place])    S/P  PACEMAKER  2009  . Thrombocytopenia, immune ([place])    CHRONIC---  HEMOTOLOGIST---  DR ENNEVER  . Venous insufficiency, peripheral   . Wears glasses     Family History  Problem Relation Age of Onset  . Cancer Mother   . Heart attack Father      Social History:   reports that she has never  smoked. She has never used smokeless tobacco. She reports that she does not drink alcohol or use drugs.  Medications No current facility-administered medications for this encounter.   Current Outpatient Medications:  .  acetaminophen (TYLENOL) 500 MG tablet, Take 500-1,000 mg by mouth daily as needed for moderate pain., Disp: , Rfl:  .  ALPRAZolam (XANAX) 0.5 MG tablet, TAKE 1 TABLET BY MOUTH AT BEDTIME (Patient taking differently: Take 0.25-0.5 mg by mouth See admin instructions. Take 0.76m daily and 0.558mat night), Disp: 30 tablet, Rfl: 0 .  amLODipine-olmesartan (AZOR) 5-20 MG tablet, TAKE 1  TABLET BY MOUTH EVERY DAY (Patient taking differently: Take 1 tablet by mouth daily. ), Disp: 90 tablet, Rfl: 0 .  apixaban (ELIQUIS) 2.5 MG TABS tablet, Take 1 tablet (2.5 mg total) by mouth 2 (two) times daily., Disp: 60 tablet, Rfl: 1 .  B Complex Vitamins (VITAMIN B COMPLEX PO), Take 1 tablet by mouth daily. , Disp: , Rfl:  .  cefTRIAXone (ROCEPHIN) IVPB, Inject 2 g into the vein daily for 23 days. Indication:  Group B strep bacteremia Last Day of Therapy:  09/29/2018 Labs - Once weekly:  CBC/D and BMP, Labs - Every other week:  ESR and CRP, Disp: 23 Units, Rfl: 0 .  cholecalciferol (VITAMIN D3) 25 MCG (1000 UT) tablet, Take 1,000 Units by mouth daily., Disp: , Rfl:  .  furosemide (LASIX) 40 MG tablet, Take 1 tablet (40 mg total) by mouth daily as needed for fluid or edema. Reported on 01/28/2015, Disp: 30 tablet, Rfl:  .  KLOR-CON M20 20 MEQ tablet, Take 1 tablet (20 mEq total) by mouth daily as needed (take with furosemide or lasix). (Patient taking differently: Take 20 mEq by mouth at bedtime. ), Disp: , Rfl:  .  omeprazole (PRILOSEC) 20 MG capsule, Take 20 mg by mouth 2 (two) times a day. , Disp: , Rfl:  .  OVER THE COUNTER MEDICATION, Take 1 tablet by mouth 2 (two) times daily. Nutrifuron (immune supplement), Disp: , Rfl:  .  Probiotic CAPS, Take 1 capsule by mouth daily., Disp: , Rfl:  .  simvastatin (ZOCOR) 10 MG tablet, TAKE 1 TABLET BY MOUTH EVERY DAY AT DINNER (Patient taking differently: Take 10 mg by mouth at bedtime. ), Disp: 30 tablet, Rfl: 0 .  Vit B6-Vit B12-Omega 3 Acids (VITAMIN B PLUS+ PO), Take 1 tablet by mouth daily., Disp: , Rfl:   Exam: Current vital signs: BP 106/72   Pulse 60   Temp 98.2 F (36.8 C) (Oral)   Resp 20   Wt 72.2 kg   SpO2 96%   BMI 28.20 kg/m  Vital signs in last 24 hours: Temp:  [98.2 F (36.8 C)] 98.2 F (36.8 C) (07/27 2239) Pulse Rate:  [60-89] 60 (07/28 0015) Resp:  [17-29] 20 (07/28 0015) BP: (98-132)/(62-93) 106/72 (07/28 0015) SpO2:   [94 %-96 %] 96 % (07/28 0015) Weight:  [72.2 kg] 72.2 kg (07/27 2240) General awake alert in no distress HEENT: Normocephalic atraumatic  Clear to auscultation Cardiovascular: S1-S2 heard regular rate rhythm Abdomen: Soft nondistended nontender Extremities: Warm well perfused Neurological exam She is awake alert oriented x3 Speech is mildly dysarthric-but that is baseline per the patient.  Naming, comprehension and repetition are intact Cranial nerves: Pupils are equal round react light, extraocular movements intact, visual fields full, no gross facial asymmetry, although it appeared on rest that right angle of the face might be a little bit more droopy which she is able to  smile normally without any obvious asymmetry, palate midline, tongue midline, shoulder shrug intact. Motor exam: Antigravity with no drift in all 4 extremities.  Has prominent right worse than left tremor which is large amplitude and present at rest and action. Sensation: Intact light touch with no extinction Coordination: In the presence of the tremor, no obvious overshoot undershoot dysmetria noted. Gait testing was deferred at this time NIH- 1 for dysarthria  Labs I have reviewed labs in epic and the results pertinent to this consultation are:  CBC    Component Value Date/Time   WBC 5.3 09/05/2018 0329   RBC 3.73 (L) 09/05/2018 0329   HGB 12.9 09/10/2018 2207   HGB 12.8 08/16/2018 1250   HGB 12.9 11/25/2016 1049   HGB 13.1 11/16/2010 1523   HCT 38.0 09/10/2018 2207   HCT 38.8 08/16/2018 1250   HCT 40.6 11/25/2016 1049   HCT 39.3 11/16/2010 1523   PLT 96 (L) 09/06/2018 0232   PLT 102 (L) 09/01/2017 1120   PLT 88 Platelet count consistent in citrate (L) 11/25/2016 1049   PLT 90 (L) 11/16/2010 1523   MCV 96.0 09/05/2018 0329   MCV 93 08/16/2018 1250   MCV 98 11/25/2016 1049   MCV 93.2 11/16/2010 1523   MCH 30.8 09/05/2018 0329   MCHC 32.1 09/05/2018 0329   RDW 13.5 09/05/2018 0329   RDW 12.0  08/16/2018 1250   RDW 14.6 11/25/2016 1049   RDW 15.3 (H) 11/16/2010 1523   LYMPHSABS 1.5 09/01/2018 1700   LYMPHSABS 1.3 08/16/2018 1250   LYMPHSABS 1.3 11/25/2016 1049   LYMPHSABS 1.1 11/16/2010 1523   MONOABS 0.6 09/01/2018 1700   MONOABS 0.5 11/16/2010 1523   EOSABS 0.2 09/01/2018 1700   EOSABS 0.2 08/16/2018 1250   EOSABS 0.1 11/25/2016 1049   BASOSABS 0.1 09/01/2018 1700   BASOSABS 0.1 08/16/2018 1250   BASOSABS 0.0 11/25/2016 1049   BASOSABS 0.0 11/16/2010 1523    CMP     Component Value Date/Time   NA 139 09/10/2018 2207   NA 140 08/16/2018 1249   K 4.4 09/10/2018 2207   CL 103 09/10/2018 2207   CO2 24 09/06/2018 0232   GLUCOSE 126 (H) 09/10/2018 2207   BUN 29 (H) 09/10/2018 2207   BUN 32 (H) 08/16/2018 1249   CREATININE 1.30 (H) 09/10/2018 2207   CREATININE 1.20 (H) 11/11/2015 1102   CALCIUM 9.9 09/06/2018 0232   PROT 5.1 (L) 09/02/2018 0400   ALBUMIN 3.1 (L) 09/02/2018 0400   AST 20 09/02/2018 0400   ALT 11 09/02/2018 0400   ALKPHOS 41 09/02/2018 0400   BILITOT 0.6 09/02/2018 0400   GFRNONAA 55 (L) 09/06/2018 0232   GFRAA >60 09/06/2018 0232    Imaging I have reviewed the images obtained:  CT-scan of the brain-no acute changes CTA with possible left M3 occlusion versus stenosis with irregular flow beyond that area.  Assessment:  85 year old with recent admission for a atrial thrombus and bacteremia, currently anticoagulated with Eliquis, with sudden onset of garbled speech that resolved and residual NIH 1 on exam. CT with no acute changes and CTA with possible left M3 occlusion versus stenosis-which could have caused possible small stroke versus TIA in the setting of an embolic process to the left M3. Other possibilities include syncope followed by hypoperfusion. Seizure less likely  Impression: Small stroke versus TIA Cardiogenic syncope  Recommendations: I do not think an MRI would be possible due to the fact that she had an old pacemaker put 35  years ago-repeat a CT head in 24 hours.  If an MRI can be done, then I would like to get an MRI done. Interrogate pacemaker I would recommend cardiology to evaluate the patient for any underlying electrographic disturbances. I am not sure an EEG would be of much used as there was no seizure-like activity noted but given her history of atrial thrombus and recent pacemaker change, cardiac etiology is more likely. PT OT. Check A1c, LDL Telemetry Consider repeating an echocardiogram-will defer to cardiology. Continue Eliquis-likely a small stroke and the risk of stopping anticoagulation would put her at more risk than any benefit that would be obtained by discontinuing Eliquis.  Repeat CT head shows increasing size of the stroke, anticoagulation can be held at the time. Stroke team will follow I discussed my plan in detail with the daughter at the bedside. Plan relayed in detail to the hospitalist admitting, Dr Marlowe Sax, over the phone.  -- Amie Portland, MD [facility] Pager: [phone] If 7pm to 7am, please call on call as listed on AMION.

## 2018-09-11 ENCOUNTER — Other Ambulatory Visit (HOSPITAL_COMMUNITY): Payer: Medicare Other

## 2018-09-11 DIAGNOSIS — I5032 Chronic diastolic (congestive) heart failure: Secondary | ICD-10-CM | POA: Diagnosis present

## 2018-09-11 DIAGNOSIS — N179 Acute kidney failure, unspecified: Secondary | ICD-10-CM | POA: Diagnosis present

## 2018-09-11 DIAGNOSIS — R7881 Bacteremia: Secondary | ICD-10-CM

## 2018-09-11 DIAGNOSIS — Z952 Presence of prosthetic heart valve: Secondary | ICD-10-CM | POA: Diagnosis not present

## 2018-09-11 DIAGNOSIS — K449 Diaphragmatic hernia without obstruction or gangrene: Secondary | ICD-10-CM | POA: Diagnosis present

## 2018-09-11 DIAGNOSIS — I63412 Cerebral infarction due to embolism of left middle cerebral artery: Secondary | ICD-10-CM | POA: Diagnosis present

## 2018-09-11 DIAGNOSIS — K219 Gastro-esophageal reflux disease without esophagitis: Secondary | ICD-10-CM | POA: Diagnosis present

## 2018-09-11 DIAGNOSIS — I639 Cerebral infarction, unspecified: Secondary | ICD-10-CM | POA: Diagnosis present

## 2018-09-11 DIAGNOSIS — I4821 Permanent atrial fibrillation: Secondary | ICD-10-CM | POA: Diagnosis present

## 2018-09-11 DIAGNOSIS — I10 Essential (primary) hypertension: Secondary | ICD-10-CM

## 2018-09-11 DIAGNOSIS — R55 Syncope and collapse: Secondary | ICD-10-CM | POA: Diagnosis present

## 2018-09-11 DIAGNOSIS — Z20828 Contact with and (suspected) exposure to other viral communicable diseases: Secondary | ICD-10-CM | POA: Diagnosis present

## 2018-09-11 DIAGNOSIS — B9620 Unspecified Escherichia coli [E. coli] as the cause of diseases classified elsewhere: Secondary | ICD-10-CM

## 2018-09-11 DIAGNOSIS — Z7901 Long term (current) use of anticoagulants: Secondary | ICD-10-CM | POA: Diagnosis not present

## 2018-09-11 DIAGNOSIS — I130 Hypertensive heart and chronic kidney disease with heart failure and stage 1 through stage 4 chronic kidney disease, or unspecified chronic kidney disease: Secondary | ICD-10-CM | POA: Diagnosis present

## 2018-09-11 DIAGNOSIS — Z85828 Personal history of other malignant neoplasm of skin: Secondary | ICD-10-CM | POA: Diagnosis not present

## 2018-09-11 DIAGNOSIS — Z809 Family history of malignant neoplasm, unspecified: Secondary | ICD-10-CM | POA: Diagnosis not present

## 2018-09-11 DIAGNOSIS — Z950 Presence of cardiac pacemaker: Secondary | ICD-10-CM | POA: Diagnosis not present

## 2018-09-11 DIAGNOSIS — I513 Intracardiac thrombosis, not elsewhere classified: Secondary | ICD-10-CM

## 2018-09-11 DIAGNOSIS — I482 Chronic atrial fibrillation, unspecified: Secondary | ICD-10-CM | POA: Diagnosis not present

## 2018-09-11 DIAGNOSIS — I6310 Cerebral infarction due to embolism of unspecified precerebral artery: Secondary | ICD-10-CM

## 2018-09-11 DIAGNOSIS — N390 Urinary tract infection, site not specified: Secondary | ICD-10-CM | POA: Diagnosis present

## 2018-09-11 DIAGNOSIS — N183 Chronic kidney disease, stage 3 (moderate): Secondary | ICD-10-CM | POA: Diagnosis present

## 2018-09-11 DIAGNOSIS — F419 Anxiety disorder, unspecified: Secondary | ICD-10-CM | POA: Diagnosis present

## 2018-09-11 DIAGNOSIS — I495 Sick sinus syndrome: Secondary | ICD-10-CM | POA: Diagnosis present

## 2018-09-11 DIAGNOSIS — E213 Hyperparathyroidism, unspecified: Secondary | ICD-10-CM | POA: Diagnosis present

## 2018-09-11 DIAGNOSIS — M8580 Other specified disorders of bone density and structure, unspecified site: Secondary | ICD-10-CM | POA: Diagnosis present

## 2018-09-11 DIAGNOSIS — Z8249 Family history of ischemic heart disease and other diseases of the circulatory system: Secondary | ICD-10-CM | POA: Diagnosis not present

## 2018-09-11 DIAGNOSIS — E785 Hyperlipidemia, unspecified: Secondary | ICD-10-CM | POA: Diagnosis present

## 2018-09-11 DIAGNOSIS — Z888 Allergy status to other drugs, medicaments and biological substances status: Secondary | ICD-10-CM | POA: Diagnosis not present

## 2018-09-11 LAB — CBC
HCT: 34.7 % — ABNORMAL LOW (ref 36.0–46.0)
Hemoglobin: 11 g/dL — ABNORMAL LOW (ref 12.0–15.0)
MCH: 30.8 pg (ref 26.0–34.0)
MCHC: 31.7 g/dL (ref 30.0–36.0)
MCV: 97.2 fL (ref 80.0–100.0)
Platelets: 100 10*3/uL — ABNORMAL LOW (ref 150–400)
RBC: 3.57 MIL/uL — ABNORMAL LOW (ref 3.87–5.11)
RDW: 14.1 % (ref 11.5–15.5)
WBC: 5.1 10*3/uL (ref 4.0–10.5)
nRBC: 0 % (ref 0.0–0.2)

## 2018-09-11 LAB — BASIC METABOLIC PANEL
Anion gap: 6 (ref 5–15)
BUN: 22 mg/dL (ref 8–23)
CO2: 29 mmol/L (ref 22–32)
Calcium: 10.3 mg/dL (ref 8.9–10.3)
Chloride: 104 mmol/L (ref 98–111)
Creatinine, Ser: 1.27 mg/dL — ABNORMAL HIGH (ref 0.44–1.00)
GFR calc Af Amer: 44 mL/min — ABNORMAL LOW (ref >60)
GFR calc non Af Amer: 38 mL/min — ABNORMAL LOW (ref >60)
Glucose, Bld: 115 mg/dL — ABNORMAL HIGH (ref 70–99)
Potassium: 4.8 mmol/L (ref 3.5–5.1)
Sodium: 139 mmol/L (ref 135–145)

## 2018-09-11 LAB — LIPID PANEL
Cholesterol: 143 mg/dL (ref 0–200)
HDL: 64 mg/dL (ref >40)
LDL Cholesterol: 69 mg/dL (ref 0–99)
Total CHOL/HDL Ratio: 2.2 RATIO
Triglycerides: 51 mg/dL (ref <150)
VLDL: 10 mg/dL (ref 0–40)

## 2018-09-11 LAB — HEMOGLOBIN A1C
Hgb A1c MFr Bld: 5.4 % (ref 4.8–5.6)
Mean Plasma Glucose: 108.28 mg/dL

## 2018-09-11 LAB — SARS CORONAVIRUS 2 BY RT PCR (HOSPITAL ORDER, PERFORMED IN ~~LOC~~ HOSPITAL LAB): SARS Coronavirus 2: NEGATIVE

## 2018-09-11 MED ORDER — APIXABAN 2.5 MG PO TABS
2.5000 mg | ORAL_TABLET | Freq: Two times a day (BID) | ORAL | Status: DC
  Administered 2018-09-11 – 2018-09-12 (×4): 2.5 mg via ORAL
  Filled 2018-09-11 (×4): qty 1

## 2018-09-11 MED ORDER — SODIUM CHLORIDE 0.9 % IV SOLN
2.0000 g | INTRAVENOUS | Status: DC
  Administered 2018-09-11 – 2018-09-14 (×4): 2 g via INTRAVENOUS
  Filled 2018-09-11 (×4): qty 20

## 2018-09-11 MED ORDER — PROBIOTIC PO CAPS: 1.0000 | ORAL_CAPSULE | Freq: Every day | ORAL | Status: DC

## 2018-09-11 MED ORDER — PANTOPRAZOLE SODIUM 20 MG PO TBEC
20.0000 mg | DELAYED_RELEASE_TABLET | Freq: Two times a day (BID) | ORAL | Status: DC
  Administered 2018-09-11 – 2018-09-14 (×7): 20 mg via ORAL
  Filled 2018-09-11 (×7): qty 1

## 2018-09-11 MED ORDER — ACETAMINOPHEN 650 MG RE SUPP: 650.0000 mg | RECTAL | Status: DC | PRN

## 2018-09-11 MED ORDER — AMLODIPINE BESYLATE 5 MG PO TABS
5.0000 mg | ORAL_TABLET | Freq: Every day | ORAL | Status: DC
  Administered 2018-09-11: 5 mg via ORAL
  Filled 2018-09-11: qty 1

## 2018-09-11 MED ORDER — ALPRAZOLAM 0.5 MG PO TABS
0.5000 mg | ORAL_TABLET | Freq: Every day | ORAL | Status: DC
  Administered 2018-09-11 – 2018-09-13 (×4): 0.5 mg via ORAL
  Filled 2018-09-11 (×4): qty 1

## 2018-09-11 MED ORDER — VITAMIN D 25 MCG (1000 UNIT) PO TABS
1000.0000 [IU] | ORAL_TABLET | Freq: Every day | ORAL | Status: DC
  Administered 2018-09-11 – 2018-09-14 (×4): 1000 [IU] via ORAL
  Filled 2018-09-11 (×4): qty 1

## 2018-09-11 MED ORDER — SIMVASTATIN 20 MG PO TABS
10.0000 mg | ORAL_TABLET | Freq: Every day | ORAL | Status: DC
  Administered 2018-09-12 – 2018-09-13 (×2): 10 mg via ORAL
  Filled 2018-09-11 (×2): qty 1

## 2018-09-11 MED ORDER — ALPRAZOLAM 0.25 MG PO TABS
0.2500 mg | ORAL_TABLET | Freq: Every day | ORAL | Status: DC
  Administered 2018-09-11 – 2018-09-14 (×4): 0.25 mg via ORAL
  Filled 2018-09-11 (×4): qty 1

## 2018-09-11 MED ORDER — SENNOSIDES-DOCUSATE SODIUM 8.6-50 MG PO TABS: 1.0000 | ORAL_TABLET | Freq: Every evening | ORAL | Status: DC | PRN

## 2018-09-11 MED ORDER — IRBESARTAN 150 MG PO TABS: 150.0000 mg | ORAL_TABLET | Freq: Every day | ORAL | Status: DC

## 2018-09-11 MED ORDER — CEFTRIAXONE IV (FOR PTA / DISCHARGE USE ONLY): 2.0000 g | INTRAVENOUS | Status: DC

## 2018-09-11 MED ORDER — AMLODIPINE-OLMESARTAN 5-20 MG PO TABS: 1.0000 | ORAL_TABLET | Freq: Every day | ORAL | Status: DC

## 2018-09-11 MED ORDER — ACETAMINOPHEN 325 MG PO TABS: 650.0000 mg | ORAL_TABLET | ORAL | Status: DC | PRN

## 2018-09-11 MED ORDER — STROKE: EARLY STAGES OF RECOVERY BOOK
Freq: Once | Status: AC
  Administered 2018-09-11: 02:00:00
  Filled 2018-09-11: qty 1

## 2018-09-11 MED ORDER — ACETAMINOPHEN 160 MG/5ML PO SOLN: 650.0000 mg | ORAL | Status: DC | PRN

## 2018-09-11 NOTE — ED Notes (Signed)
Attempted report 

## 2018-09-11 NOTE — Evaluation (Signed)
Physical Therapy Evaluation Patient Details Name: [name] MRN: [id number] DOB: 1929/09/07 Today's Date: 09/11/2018   History of Present Illness  Patient is a 85 y/o female presenting to the ED on 09/10/2018 with primary complaints of garbled speech/strokelike symptoms. Past medical history significant of paroxysmal atrial fibrillation, sick sinus syndrome status post PPM, hypertension, hyperlipidemia, chronic diastolic congestive heart failure, CKD stage III, GERD, essential tremors, basal cell carcinoma of forehead. Head CT negative for acute finding. Admitted for continued work-up.    Clinical Impression  Patient admitted with the above. Patient reports she lives with her son who is available to assist with mobility and ADLs. Patient today requiring increased cueing for safety and sequencing with up to Mod A for functional mobility with assist for RW management as she often hits objects in her path. Patient with unsteady gait pattern, with patient stating awareness. Currently will recommend SNF due to instability and general weakness. PT to follow acutely.     Follow Up Recommendations SNF;Supervision/Assistance - 24 hour    Equipment Recommendations  None recommended by PT    Recommendations for Other Services       Precautions / Restrictions Precautions Precautions: Fall Restrictions Weight Bearing Restrictions: No      Mobility  Bed Mobility Overal bed mobility: Needs Assistance Bed Mobility: Supine to Sit     Supine to sit: Min assist;Min guard     General bed mobility comments: Increased time and effort  Transfers Overall transfer level: Needs assistance Equipment used: Rolling walker (2 wheeled) Transfers: Sit to/from [facility] Sit to Stand: Min assist;Mod assist Stand pivot transfers: Min assist;Mod assist       General transfer comment: for safety and sequencing; cueing for hand placement  Ambulation/Gait Ambulation/Gait  assistance: Mod assist Gait Distance (Feet): 30 Feet Assistive device: Rolling walker (2 wheeled) Gait Pattern/deviations: Step-to pattern;Step-through pattern;Decreased stride length;Trunk flexed Gait velocity: reduced   General Gait Details: trunk flexed throughout; requires assist for RW management/obstacle navigation; cueing for safety  Stairs            Wheelchair Mobility    Modified Rankin (Stroke Patients Only)       Balance Overall balance assessment: Needs assistance Sitting-balance support: No upper extremity supported;Feet supported Sitting balance-Leahy Scale: Fair     Standing balance support: Bilateral upper extremity supported;During functional activity Standing balance-Leahy Scale: Poor                               Pertinent Vitals/Pain Pain Assessment: No/denies pain    Home Living Family/patient expects to be discharged to:: Private residence Living Arrangements: Children Available Help at Discharge: Family;Available 24 hours/day Type of Home: House Home Access: Stairs to enter   [employer] of Steps: 1 Home Layout: Two level Home Equipment: Walker - 2 wheels;Cane - single point;Shower seat;Grab bars - toilet;Grab bars - tub/shower      Prior Function Level of Independence: Needs assistance   Gait / Transfers Assistance Needed: Reports she uses RW for ambulation   ADL's / Homemaking Assistance Needed: daughter assist with bathing        Hand Dominance   Dominant Hand: Right    Extremity/Trunk Assessment   Upper Extremity Assessment Upper Extremity Assessment: Defer to OT evaluation    Lower Extremity Assessment Lower Extremity Assessment: Generalized weakness    Cervical / Trunk Assessment Cervical / Trunk Assessment: Kyphotic  Communication   Communication: [facility]  Cognition Arousal/Alertness: Awake/alert Behavior During Therapy: WFL for tasks assessed/performed Overall Cognitive Status: No  family/caregiver present to determine baseline cognitive functioning                                        General Comments      Exercises     Assessment/Plan    PT Assessment Patient needs continued PT services  PT Problem List Decreased strength;Decreased balance;Decreased mobility;Decreased knowledge of use of DME;Decreased activity tolerance;Decreased safety awareness       PT Treatment Interventions Gait training;DME instruction;Stair training;Therapeutic activities;Functional mobility training;Therapeutic exercise;Balance training;Patient/family education    PT Goals (Current goals can be found in the Care Plan section)  Acute Rehab PT Goals Patient Stated Goal: to go home PT Goal Formulation: With patient Time For Goal Achievement: 09/25/18 Potential to Achieve Goals: Good    Frequency Min 3X/week   Barriers to discharge        Co-evaluation PT/OT/SLP Co-Evaluation/Treatment: Yes Reason for Co-Treatment: Necessary to address cognition/behavior during functional activity;For patient/therapist safety;To address functional/ADL transfers PT goals addressed during session: Mobility/safety with mobility;Balance;Proper use of DME         AM-PAC PT "6 Clicks" Mobility  Outcome Measure Help needed turning from your back to your side while in a flat bed without using bedrails?: A Little Help needed moving from lying on your back to sitting on the side of a flat bed without using bedrails?: A Little Help needed moving to and from a bed to a chair (including a wheelchair)?: A Little Help needed standing up from a chair using your arms (e.g., wheelchair or bedside chair)?: A Little Help needed to walk in hospital room?: A Lot Help needed climbing 3-5 steps with a railing? : Total 6 Click Score: 15    End of Session Equipment Utilized During Treatment: Gait belt Activity Tolerance: Patient tolerated treatment well Patient left: in chair;with call  bell/phone within reach Nurse Communication: Mobility status PT Visit Diagnosis: Unsteadiness on feet (R26.81);Muscle weakness (generalized) (M62.81);Other abnormalities of gait and mobility (R26.89)    Time: 7972-8206 PT Time Calculation (min) (ACUTE ONLY): 25 min   Charges:   PT Evaluation $PT Eval Moderate Complexity: 1 Mod           Lanney Gins, PT, DPT Supplemental Physical Therapist 09/11/18 2:39 PM Pager: [phone] Office: [phone]

## 2018-09-11 NOTE — Evaluation (Signed)
Occupational Therapy Evaluation Patient Details Name: [name] MRN: [id number] DOB: 1929/05/07 Today's Date: 09/11/2018    History of Present Illness Patient is a 85 y/o female presenting to the ED on 09/10/2018 with primary complaints of garbled speech/strokelike symptoms. Past medical history significant of paroxysmal atrial fibrillation, sick sinus syndrome status post PPM, hypertension, hyperlipidemia, chronic diastolic congestive heart failure, CKD stage III, GERD, essential tremors, basal cell carcinoma of forehead. Head CT negative for acute finding. Admitted for continued work-up.   Clinical Impression   PT admitted with pending workup for CVA. Pt currently with functional limitiations due to the deficits listed below (see OT problem list). Pt currently mod (A) for RW use for transfer. Pt requires (A) for LB and peri care this session. Pt with decrease sequence for adls needing mod cues.  Pt will benefit from skilled OT to increase their independence and safety with adls and balance to allow discharge SNF.     Follow Up Recommendations  SNF    Equipment Recommendations  3 in 1 bedside commode    Recommendations for Other Services       Precautions / Restrictions Precautions Precautions: Fall Restrictions Weight Bearing Restrictions: No      Mobility Bed Mobility Overal bed mobility: Needs Assistance Bed Mobility: Supine to Sit     Supine to sit: Min assist;Min guard     General bed mobility comments: Increased time and effort  Transfers Overall transfer level: Needs assistance Equipment used: Rolling walker (2 wheeled) Transfers: Sit to/from [facility] Sit to Stand: Min assist;Mod assist Stand pivot transfers: Min assist;Mod assist       General transfer comment: for safety and sequencing; cueing for hand placement    Balance Overall balance assessment: Needs assistance Sitting-balance support: No upper extremity supported;Feet  supported Sitting balance-Leahy Scale: Fair     Standing balance support: Bilateral upper extremity supported;During functional activity Standing balance-Leahy Scale: Poor                             ADL either performed or assessed with clinical judgement   ADL Overall ADL's : Needs assistance/impaired Eating/Feeding: Set up   Grooming: Wash/dry face;Minimal assistance;Standing;Wash/dry hands Grooming Details (indicate cue type and reason): needs cues for sequence with ahnd hyigene Upper Body Bathing: Minimal assistance   Lower Body Bathing: Minimal assistance   Upper Body Dressing : Minimal assistance   Lower Body Dressing: Minimal assistance   Toilet Transfer: Minimal assistance;RW;BSC   Toileting- Clothing Manipulation and Hygiene: Maximal assistance;Sit to/from stand       Functional mobility during ADLs: Moderate assistance;Rolling walker General ADL Comments: pt walking into objects with Rw on Right side     Vision Baseline Vision/History: Wears glasses       Perception     Praxis      Pertinent Vitals/Pain Pain Assessment: No/denies pain     Hand Dominance Right   Extremity/Trunk Assessment Upper Extremity Assessment Upper Extremity Assessment: RUE deficits/detail;LUE deficits/detail RUE Deficits / Details: tremors noted LUE Deficits / Details: tremors noted   Lower Extremity Assessment Lower Extremity Assessment: Defer to PT evaluation   Cervical / Trunk Assessment Cervical / Trunk Assessment: Kyphotic   Communication Communication Communication: HOH   Cognition Arousal/Alertness: Awake/alert Behavior During Therapy: WFL for tasks assessed/performed Overall Cognitive Status: No family/caregiver present to determine baseline cognitive functioning  General Comments  VSS    Exercises     Shoulder Instructions      Home Living Family/patient expects to be discharged to::  Private residence Living Arrangements: Children Available Help at Discharge: Family;Available 24 hours/day Type of Home: House Home Access: Stairs to enter [employer] of Steps: 1   Home Layout: Two level Alternate Level Stairs-Number of Steps: (flight) Alternate Level Stairs-Rails: Right;Left;Can reach both Bathroom Shower/Tub: Occupational psychologist: Handicapped height     Home Equipment: Environmental consultant - 2 wheels;Cane - single point;Shower seat;Grab bars - toilet;Grab bars - tub/shower      Lives With: Family    Prior Functioning/Environment Level of Independence: Needs assistance  Gait / Transfers Assistance Needed: Reports she uses RW for ambulation  ADL's / Homemaking Assistance Needed: daughter assist with bathing            OT Problem List: Decreased strength;Decreased activity tolerance;Impaired balance (sitting and/or standing);Decreased coordination;Decreased cognition;Decreased safety awareness;Decreased knowledge of use of DME or AE;Decreased knowledge of precautions      OT Treatment/Interventions: Self-care/ADL training;Therapeutic exercise;Neuromuscular education;DME and/or AE instruction;Therapeutic activities;Cognitive remediation/compensation;Balance training;Patient/family education    OT Goals(Current goals can be found in the care plan section) Acute Rehab OT Goals Patient Stated Goal: to go home OT Goal Formulation: Patient unable to participate in goal setting Time For Goal Achievement: 09/24/18 Potential to Achieve Goals: Good  OT Frequency: Min 2X/week   Barriers to D/C:    uncertain of family (A) level       Co-evaluation PT/OT/SLP Co-Evaluation/Treatment: Yes Reason for Co-Treatment: Necessary to address cognition/behavior during functional activity PT goals addressed during session: Mobility/safety with mobility;Balance;Proper use of DME OT goals addressed during session: ADL's and self-care;Proper use of Adaptive equipment  and DME;Strengthening/ROM      AM-PAC OT "6 Clicks" Daily Activity     Outcome Measure Help from another person eating meals?: A Little Help from another person taking care of personal grooming?: A Little Help from another person toileting, which includes using toliet, bedpan, or urinal?: A Lot Help from another person bathing (including washing, rinsing, drying)?: A Lot Help from another person to put on and taking off regular upper body clothing?: A Little Help from another person to put on and taking off regular lower body clothing?: A Lot 6 Click Score: 15   End of Session Equipment Utilized During Treatment: Gait belt;Rolling walker Nurse Communication: Mobility status;Precautions  Activity Tolerance: Patient tolerated treatment well Patient left: in chair;with call bell/phone within reach(no alarms availabel on unit. RN in room )  OT Visit Diagnosis: Unsteadiness on feet (R26.81)                Time: 6415-8309 OT Time Calculation (min): 24 min Charges:  OT General Charges $OT Visit: 1 Visit OT Evaluation $OT Eval Moderate Complexity: 1 Mod   Jeri Modena, OTR/L  Acute Rehabilitation Services Pager: [phone] Office: [phone] .   Jeri Modena 09/11/2018, 4:21 PM

## 2018-09-11 NOTE — ED Notes (Signed)
ED TO INPATIENT HANDOFF REPORT  ED Nurse Name and Phone #:  Gretta Cool [phone]  S Name/Age/Gender [name] 85 y.o. female Room/Bed: TRABC/TRABC  Code Status   Code Status: Full Code  Home/SNF/Other Home Patient oriented to: self, time, date, event Is this baseline? Yes   Triage Complete: Triage complete  Chief Complaint CODE STROKE  Triage Note Patient was at home with son, patient was coming out of bathroom and heard a noise.  Patient started having garbled speech, unable to recognized common items.  This resolved by the time EMS arrival.  LSN at 2100.  During time with EMS, patient had a second episode of garbled speech and inability to recognize common items with EMS at 2130.  This also resolved at 2140.  Patient is CAOx4 with no symptoms at this time upon arrival to ED.     Allergies Allergies  Allergen Reactions  . Zoloft [Sertraline Hcl]     Level of Care/Admitting Diagnosis ED Disposition    ED Disposition Condition [facility] Area: [place] [[id number]]  Level of Care: Telemetry Medical [104]  Covid Evaluation: Asymptomatic Screening Protocol (No Symptoms)  Diagnosis: CVA (cerebral vascular accident) [facility]) [[id number]]  Admitting Physician: Shela Leff [[id number]]  Attending Physician: Shela Leff [[id number]]  Estimated length of stay: past midnight tomorrow  Certification:: I certify this patient will need inpatient services for at least 2 midnights  PT Class (Do Not Modify): Inpatient [101]  PT Acc Code (Do Not Modify): Private [1]       B Medical/Surgery History Past Medical History:  Diagnosis Date  . Anxiety   . Arthritis    "mostly my hands" (01/29/2015)  . Basal cell carcinoma of forehead   . Benign essential tremor   . CAP (community acquired pneumonia) 01/29/2015  . CHF (congestive heart failure) ([facility])   . Depression   . Edema of both legs 04-23-2013  pt states feet swelling (wears ted hose)    bilateral venous ablation procedures done in IR, L>R  . GERD (gastroesophageal reflux disease)   . H/O hiatal hernia   . Hyperlipidemia   . Hyperparathyroidism ([place])    MILD--  STABLE  . Hypertension   . Kidney stones   . Osteopenia   . Permanent atrial fibrillation    CARDIOLOGIST-- DR ZJIRCVEL  . Pneumonia "several times"  . Presence of permanent cardiac pacemaker    LAST PACER CHECK  04-23-2013  IN [facility]  . Right ureteral stone   . S/P mitral valve replacement with bioprosthetic valve    04-03-2009  . Schatzki's ring   . Sick sinus syndrome ([place])    S/P  PACEMAKER  2009  . Thrombocytopenia, immune ([place])    CHRONIC---  HEMOTOLOGIST---  DR ENNEVER  . Venous insufficiency, peripheral   . Wears glasses    Past Surgical History:  Procedure Laterality Date  . CARDIAC CATHETERIZATION  03/27/2009   normal coronaries; grade IV Mitral Insuff.  Marland Kitchen CARDIAC CATHETERIZATION  2001  &  2006  . CARDIAC VALVE REPLACEMENT    . CATARACT EXTRACTION W/ INTRAOCULAR LENS  IMPLANT, BILATERAL Bilateral ~ 2012  . CYSTOSCOPY W/ URETERAL STENT PLACEMENT Left 08/01/2012   Procedure: CYSTOSCOPY WITH RETROGRADE PYELOGRAM/URETERAL STENT PLACEMENT;  Surgeon: Dutch Gray, MD;  Location: [facility];  Service: Urology;  Laterality: Left;  . CYSTOSCOPY WITH URETEROSCOPY Left 08/14/2012   Procedure: LEFT URETEROSCOPY WITH HOLMIUM LASER AND LEFT  STENT PLACEMENT;  Surgeon: Malka So, MD;  Location: [facility];  Service: Urology;  Laterality: Left;  . CYSTOSCOPY WITH URETEROSCOPY AND STENT PLACEMENT Right 04/25/2013   Procedure: RIGHT URETEROSCOPY WITH  STENT PLACEMENT;  Surgeon: Irine Seal, MD;  Location[facility];  Service: Urology;  Laterality: Right;  . ESOPHAGOGASTRODUODENOSCOPY (EGD) WITH ESOPHAGEAL DILATION  "a couple times" (01/29/2015)  . EXTRACORPOREAL SHOCK WAVE LITHOTRIPSY Left 07-26-2012  . HAND SURGERY Right 05/2011   "no fracture; straightened things up  . HAND SURGERY Left 2014   "no  fracture; straightened things up"  . HOLMIUM LASER APPLICATION Left 05/18/3152   Procedure:  HOLMIUM LASER APPLICATION;  Surgeon: Malka So, MD;  Location: [facility];  Service: Urology;  Laterality: Left;  . HOLMIUM LASER APPLICATION Right 0/09/6759   Procedure: HOLMIUM LASER APPLICATION;  Surgeon: Irine Seal, MD;  Location: [facility];  Service: Urology;  Laterality: Right;  . MITRAL VALVE REPLACEMENT WITH CHORDAL PRESEVATION USING BIOPROSTHETIC VALVE/  LIGATION LEFT ATRIAL APPENDAGE/  LEFT-SIDED MAZE PROCEDURE  04-03-2009  DR Ethel Rana,  SERIAL # [id number]  . NM MYOCAR PERF WALL MOTION  03/01/1999   mild lateral ischemia  . PERMANENT PACEMAKER INSERTION  02/26/2007   Dual Chamber [employer] Programmed VVIR--- Generator ADDR01/  #[id number]   . PPM GENERATOR CHANGEOUT N/A 08/20/2018   Procedure: PPM GENERATOR CHANGEOUT;  Surgeon: Sanda Klein, MD;  Location: [facility] [facility];  Service: Cardiovascular;  Laterality: N/A;  . TEE WITHOUT CARDIOVERSION N/A 09/05/2018   Procedure: TRANSESOPHAGEAL ECHOCARDIOGRAM (TEE);  Surgeon: Pixie Casino, MD;  Location: [facility];  Service: Cardiovascular;  Laterality: N/A;  . TRANSTHORACIC ECHOCARDIOGRAM  09-07-2012  DR CROITORU   MILD LVH/  EF 55-60%/  MILD TO MODERATE AR/   SEVERE LAE  &  RAE/  MILD TO MODERATE TR/   MITRAL VALVE BIOPROSTHESIS LEAFLET NORMAL,  MODERATE STENOSIS WITH NO SIG. REGURG/  MODERATE RVE  . VAGINAL HYSTERECTOMY  1982     A IV Location/Drains/Wounds Patient Lines/Drains/Airways Status   Active Line/Drains/Airways    Name:   Placement date:   Placement time:   Site:   Days:   Peripheral IV 09/10/18 Left Forearm   09/10/18    -    Forearm   1   PICC Single Lumen 09/06/18 PICC Right Brachial 40 cm 0 cm   09/06/18    1331    Brachial   5   External Urinary Catheter   09/10/18    2305    -   1   Incision (Closed) 09/01/18 Chest Left   09/01/18    2130     10          Intake/Output Last 24 hours No  intake or output data in the 24 hours ending 09/11/18 0013  Labs/Imaging Results for orders placed or performed during the hospital encounter of 09/10/18 (from the past 48 hour(s))  CBG monitoring, ED     Status: Abnormal   Collection Time: 09/10/18 10:03 PM  Result Value Ref Range   Glucose-Capillary 125 (H) 70 - 99 mg/dL  Ethanol     Status: None   Collection Time: 09/10/18 10:05 PM  Result Value Ref Range   Alcohol, Ethyl (B) <10 <10 mg/dL    Comment: (NOTE) Lowest detectable limit for serum alcohol is 10 mg/dL. For medical purposes only. Performed at [facility], [place] [address], [place], [place] [address]   Protime-INR     Status: None   Collection  Time: 09/10/18 10:05 PM  Result Value Ref Range   Prothrombin Time 14.0 11.4 - 15.2 seconds   INR 1.1 0.8 - 1.2    Comment: (NOTE) INR goal varies based on device and disease states. Performed at [facility], [place] [address] [place], [place] [address]   APTT     Status: None   Collection Time: 09/10/18 10:05 PM  Result Value Ref Range   aPTT 30 24 - 36 seconds    Comment: Performed at [facility] [address], [place], [place] [address]  CBC     Status: Abnormal   Collection Time: 09/10/18 10:05 PM  Result Value Ref Range   WBC 6.2 4.0 - 10.5 K/uL   RBC 3.84 (L) 3.87 - 5.11 MIL/uL   Hemoglobin 11.9 (L) 12.0 - 15.0 g/dL   HCT 38.2 36.0 - 46.0 %   MCV 99.5 80.0 - 100.0 fL   MCH 31.0 26.0 - 34.0 pg   MCHC 31.2 30.0 - 36.0 g/dL   RDW 14.0 11.5 - 15.5 %   Platelets 111 (L) 150 - 400 K/uL    Comment: REPEATED TO VERIFY SPECIMEN CHECKED FOR CLOTS Immature Platelet Fraction may be clinically indicated, consider ordering this additional test [id number] CONSISTENT WITH PREVIOUS RESULT    nRBC 0.0 0.0 - 0.2 %    Comment: Performed at [facility], [place] [address] [place], [place] [address]  Differential     Status: None   Collection Time: 09/10/18 10:05 PM  Result Value Ref Range   Neutrophils  Relative % 59 %   Neutro Abs 3.6 1.7 - 7.7 K/uL   Lymphocytes Relative 23 %   Lymphs Abs 1.4 0.7 - 4.0 K/uL   Monocytes Relative 14 %   Monocytes Absolute 0.9 0.1 - 1.0 K/uL   Eosinophils Relative 3 %   Eosinophils Absolute 0.2 0.0 - 0.5 K/uL   Basophils Relative 1 %   Basophils Absolute 0.1 0.0 - 0.1 K/uL   Immature Granulocytes 0 %   Abs Immature Granulocytes 0.02 0.00 - 0.07 K/uL    Comment: Performed at [facility], [place] [address] [place], [place] [address]  Comprehensive metabolic panel     Status: Abnormal   Collection Time: 09/10/18 10:05 PM  Result Value Ref Range   Sodium 140 135 - 145 mmol/L   Potassium 4.5 3.5 - 5.1 mmol/L   Chloride 104 98 - 111 mmol/L   CO2 26 22 - 32 mmol/L   Glucose, Bld 132 (H) 70 - 99 mg/dL   BUN 27 (H) 8 - 23 mg/dL   Creatinine, Ser 1.34 (H) 0.44 - 1.00 mg/dL   Calcium 10.6 (H) 8.9 - 10.3 mg/dL   Total Protein 5.7 (L) 6.5 - 8.1 g/dL   Albumin 3.5 3.5 - 5.0 g/dL   AST 33 15 - 41 U/L   ALT 24 0 - 44 U/L   Alkaline Phosphatase 51 38 - 126 U/L   Total Bilirubin 0.4 0.3 - 1.2 mg/dL   GFR calc non Af Amer 36 (L) >60 mL/min   GFR calc Af Amer 41 (L) >60 mL/min   Anion gap 10 5 - 15    Comment: Performed at [facility] [address] [place],  [address]  I-stat chem 8, ED     Status: Abnormal   Collection Time: 09/10/18 10:07 PM  Result Value Ref Range   Sodium 139 135 - 145 mmol/L  Potassium 4.4 3.5 - 5.1 mmol/L   Chloride 103 98 - 111 mmol/L   BUN 29 (H) 8 - 23 mg/dL   Creatinine, Ser 1.30 (H) 0.44 - 1.00 mg/dL   Glucose, Bld 126 (H) 70 - 99 mg/dL   Calcium, Ion 1.32 1.15 - 1.40 mmol/L   TCO2 29 22 - 32 mmol/L   Hemoglobin 12.9 12.0 - 15.0 g/dL   HCT 38.0 36.0 - 46.0 %  Urine rapid drug screen (hosp performed)     Status: Abnormal   Collection Time: 09/10/18 11:08 PM  Result Value Ref Range   Opiates NONE DETECTED NONE DETECTED   Cocaine NONE DETECTED NONE DETECTED   Benzodiazepines POSITIVE (A) NONE DETECTED    Amphetamines NONE DETECTED NONE DETECTED   Tetrahydrocannabinol NONE DETECTED NONE DETECTED   Barbiturates NONE DETECTED NONE DETECTED    Comment: (NOTE) DRUG SCREEN FOR MEDICAL PURPOSES ONLY.  IF CONFIRMATION IS NEEDED FOR ANY PURPOSE, NOTIFY LAB WITHIN 5 DAYS. LOWEST DETECTABLE LIMITS FOR URINE DRUG SCREEN Drug Class                     Cutoff (ng/mL) Amphetamine and metabolites    1000 Barbiturate and metabolites    200 Benzodiazepine                 412 Tricyclics and metabolites     300 Opiates and metabolites        300 Cocaine and metabolites        300 THC                            50 Performed at [facility], [place] [address] [place], [place] [address]   Urinalysis, Routine w reflex microscopic     Status: Abnormal   Collection Time: 09/10/18 11:08 PM  Result Value Ref Range   Color, Urine YELLOW YELLOW   APPearance HAZY (A) CLEAR   Specific Gravity, Urine 1.041 (H) 1.005 - 1.030   pH 5.0 5.0 - 8.0   Glucose, UA NEGATIVE NEGATIVE mg/dL   Hgb urine dipstick SMALL (A) NEGATIVE   Bilirubin Urine NEGATIVE NEGATIVE   Ketones, ur NEGATIVE NEGATIVE mg/dL   Protein, ur NEGATIVE NEGATIVE mg/dL   Nitrite NEGATIVE NEGATIVE   Leukocytes,Ua MODERATE (A) NEGATIVE   RBC / HPF 0-5 0 - 5 RBC/hpf   WBC, UA 11-20 0 - 5 WBC/hpf   Bacteria, UA NONE SEEN NONE SEEN   Squamous Epithelial / LPF 0-5 0 - 5    Comment: Performed at [facility], [place] [address], [place], [place] [address]   Ct Angio Head W Or Wo Contrast  Result Date: 09/10/2018 CLINICAL DATA:  Code stroke. Initial evaluation for acute stroke, garbled speech. EXAM: CT ANGIOGRAPHY HEAD AND NECK TECHNIQUE: Multidetector CT imaging of the head and neck was performed using the standard protocol during bolus administration of intravenous contrast. Multiplanar CT image reconstructions and MIPs were obtained to evaluate the vascular anatomy. Carotid stenosis measurements (when applicable) are obtained utilizing NASCET  criteria, using the distal internal carotid diameter as the denominator. CONTRAST:  53mL OMNIPAQUE IOHEXOL 350 MG/ML SOLN COMPARISON:  Prior CT from 02/11/2018. FINDINGS: CT HEAD FINDINGS Brain: Diffuse prominence of the CSF containing spaces compatible generalized age-related cerebral atrophy. Mild chronic microvascular ischemic disease. No acute intracranial hemorrhage. No acute large vessel territory infarct. No mass lesion, midline shift or mass effect. No hydrocephalus. No extra-axial  fluid collection. Vascular: No hyperdense vessel. Scattered vascular calcifications noted within the carotid siphons. Skull: Scalp soft tissues and calvarium within normal limits. Sinuses/Orbits: Globes orbital soft tissues demonstrate no acute finding. Paranasal sinuses are clear. No mastoid effusion. Other: None. ASPECTS ([place] Stroke Program Early CT Score) - Ganglionic level infarction (caudate, lentiform nuclei, internal capsule, insula, M1-M3 cortex): 7 - Supraganglionic infarction (M4-M6 cortex): 3 Total score (0-10 with 10 being normal): 10 Review of the MIP images confirms the above findings CTA NECK FINDINGS Aortic arch: Visualized aortic arch of normal caliber with normal 3 vessel morphology. Mild atheromatous plaque within the aortic arch. No hemodynamically significant stenosis seen about the origin of the great vessels. Visualized subclavian arteries widely patent. Right carotid system: Right CCA tortuous proximally but widely patent to the bifurcation without stenosis. No significant atheromatous narrowing about the right bifurcation. Right ICA tortuous but widely patent to the skull base without stenosis, dissection, or occlusion. Left carotid system: Left CCA tortuous proximally but widely patent to the bifurcation without stenosis. No significant atheromatous narrowing about the left bifurcation. Left ICA tortuous but widely patent to the skull base without stenosis, dissection or occlusion. Vertebral arteries:  Both vertebral arteries arise from the subclavian arteries. Vertebral arteries tortuous proximally but widely patent to the skull base without stenosis, dissection or occlusion. Skeleton: No acute osseous finding. No discrete lytic or blastic osseous lesions. Other neck: No other acute soft tissue abnormality within the neck. Subcentimeter hypodense left thyroid nodule noted, of doubtful significance. Torus palatini and torus mandibularis noted. Upper chest: Visualized upper chest demonstrates no acute finding. Left-sided pacemaker/AICD noted. Median sternotomy wires noted as well. Review of the MIP images confirms the above findings CTA HEAD FINDINGS Anterior circulation: Petrous segments widely patent. Mild atheromatous plaque within the cavernous/supraclinoid ICAs without hemodynamically significant stenosis. A1 segments widely patent. Normal anterior communicating artery. Anterior cerebral arteries widely patent to their distal aspects without stenosis. Right M1 widely patent. Normal right MCA bifurcation. Distal right MCA branches well perfused. Left M1 widely patent. Normal left MCA bifurcation. There is a focal abrupt occlusion of a distal left M3 branch, superior division (series 8, image 94). Irregular attenuated flow seen distally. Distal left MCA branches otherwise well perfused to their distal aspects. Posterior circulation: Both vertebral arteries widely patent to the vertebrobasilar junction. Posteroinferior cerebral arteries patent bilaterally. Basilar widely patent to its distal aspect without stenosis. Superior cerebral arteries perfuse bilaterally. Both of the posterior cerebral arteries primarily supplied via the basilar and are well perfused to their distal aspects. Venous sinuses: Patent. Anatomic variants: None significant. Review of the MIP images confirms the above findings IMPRESSION: CT HEAD IMPRESSION: 1. No acute intracranial abnormality identified. 2. Aspects equals 10/10. 3. Age-related  cerebral atrophy with mild chronic microvascular ischemic disease. CTA HEAD AND NECK IMPRESSION: 1. Positive CTA with abrupt occlusion of a distal left M3 branch, superior division. Finding likely embolic in nature. Irregular attenuated flow seen beyond the level of occlusion. 2. Mild atheromatous change for age elsewhere about the aortic arch and carotid siphons without hemodynamically significant stenosis. 3. Diffuse tortuosity about the proximal major arterial vasculature of the neck, suggesting chronic underlying hypertension. These results were communicated to Dr. Rory Percy at 10:41 pmon 7/27/2020by text page via the [facility] messaging system. Electronically Signed   By: Jeannine Boga M.D.   On: 09/10/2018 22:59   Ct Angio Neck W Or Wo Contrast  Result Date: 09/10/2018 CLINICAL DATA:  Code stroke. Initial evaluation for acute stroke, garbled  speech. EXAM: CT ANGIOGRAPHY HEAD AND NECK TECHNIQUE: Multidetector CT imaging of the head and neck was performed using the standard protocol during bolus administration of intravenous contrast. Multiplanar CT image reconstructions and MIPs were obtained to evaluate the vascular anatomy. Carotid stenosis measurements (when applicable) are obtained utilizing NASCET criteria, using the distal internal carotid diameter as the denominator. CONTRAST:  42mL OMNIPAQUE IOHEXOL 350 MG/ML SOLN COMPARISON:  Prior CT from 02/11/2018. FINDINGS: CT HEAD FINDINGS Brain: Diffuse prominence of the CSF containing spaces compatible generalized age-related cerebral atrophy. Mild chronic microvascular ischemic disease. No acute intracranial hemorrhage. No acute large vessel territory infarct. No mass lesion, midline shift or mass effect. No hydrocephalus. No extra-axial fluid collection. Vascular: No hyperdense vessel. Scattered vascular calcifications noted within the carotid siphons. Skull: Scalp soft tissues and calvarium within normal limits. Sinuses/Orbits: Globes orbital soft tissues  demonstrate no acute finding. Paranasal sinuses are clear. No mastoid effusion. Other: None. ASPECTS ([place] Stroke Program Early CT Score) - Ganglionic level infarction (caudate, lentiform nuclei, internal capsule, insula, M1-M3 cortex): 7 - Supraganglionic infarction (M4-M6 cortex): 3 Total score (0-10 with 10 being normal): 10 Review of the MIP images confirms the above findings CTA NECK FINDINGS Aortic arch: Visualized aortic arch of normal caliber with normal 3 vessel morphology. Mild atheromatous plaque within the aortic arch. No hemodynamically significant stenosis seen about the origin of the great vessels. Visualized subclavian arteries widely patent. Right carotid system: Right CCA tortuous proximally but widely patent to the bifurcation without stenosis. No significant atheromatous narrowing about the right bifurcation. Right ICA tortuous but widely patent to the skull base without stenosis, dissection, or occlusion. Left carotid system: Left CCA tortuous proximally but widely patent to the bifurcation without stenosis. No significant atheromatous narrowing about the left bifurcation. Left ICA tortuous but widely patent to the skull base without stenosis, dissection or occlusion. Vertebral arteries: Both vertebral arteries arise from the subclavian arteries. Vertebral arteries tortuous proximally but widely patent to the skull base without stenosis, dissection or occlusion. Skeleton: No acute osseous finding. No discrete lytic or blastic osseous lesions. Other neck: No other acute soft tissue abnormality within the neck. Subcentimeter hypodense left thyroid nodule noted, of doubtful significance. Torus palatini and torus mandibularis noted. Upper chest: Visualized upper chest demonstrates no acute finding. Left-sided pacemaker/AICD noted. Median sternotomy wires noted as well. Review of the MIP images confirms the above findings CTA HEAD FINDINGS Anterior circulation: Petrous segments widely patent. Mild  atheromatous plaque within the cavernous/supraclinoid ICAs without hemodynamically significant stenosis. A1 segments widely patent. Normal anterior communicating artery. Anterior cerebral arteries widely patent to their distal aspects without stenosis. Right M1 widely patent. Normal right MCA bifurcation. Distal right MCA branches well perfused. Left M1 widely patent. Normal left MCA bifurcation. There is a focal abrupt occlusion of a distal left M3 branch, superior division (series 8, image 94). Irregular attenuated flow seen distally. Distal left MCA branches otherwise well perfused to their distal aspects. Posterior circulation: Both vertebral arteries widely patent to the vertebrobasilar junction. Posteroinferior cerebral arteries patent bilaterally. Basilar widely patent to its distal aspect without stenosis. Superior cerebral arteries perfuse bilaterally. Both of the posterior cerebral arteries primarily supplied via the basilar and are well perfused to their distal aspects. Venous sinuses: Patent. Anatomic variants: None significant. Review of the MIP images confirms the above findings IMPRESSION: CT HEAD IMPRESSION: 1. No acute intracranial abnormality identified. 2. Aspects equals 10/10. 3. Age-related cerebral atrophy with mild chronic microvascular ischemic disease. CTA HEAD AND NECK IMPRESSION:  1. Positive CTA with abrupt occlusion of a distal left M3 branch, superior division. Finding likely embolic in nature. Irregular attenuated flow seen beyond the level of occlusion. 2. Mild atheromatous change for age elsewhere about the aortic arch and carotid siphons without hemodynamically significant stenosis. 3. Diffuse tortuosity about the proximal major arterial vasculature of the neck, suggesting chronic underlying hypertension. These results were communicated to Dr. Rory Percy at 10:41 pmon 7/27/2020by text page via the [facility] messaging system. Electronically Signed   By: Jeannine Boga M.D.   On:  09/10/2018 22:59   Ct Head Code Stroke Wo Contrast  Result Date: 09/10/2018 CLINICAL DATA:  Code stroke. Initial evaluation for acute stroke, garbled speech. EXAM: CT ANGIOGRAPHY HEAD AND NECK TECHNIQUE: Multidetector CT imaging of the head and neck was performed using the standard protocol during bolus administration of intravenous contrast. Multiplanar CT image reconstructions and MIPs were obtained to evaluate the vascular anatomy. Carotid stenosis measurements (when applicable) are obtained utilizing NASCET criteria, using the distal internal carotid diameter as the denominator. CONTRAST:  28mL OMNIPAQUE IOHEXOL 350 MG/ML SOLN COMPARISON:  Prior CT from 02/11/2018. FINDINGS: CT HEAD FINDINGS Brain: Diffuse prominence of the CSF containing spaces compatible generalized age-related cerebral atrophy. Mild chronic microvascular ischemic disease. No acute intracranial hemorrhage. No acute large vessel territory infarct. No mass lesion, midline shift or mass effect. No hydrocephalus. No extra-axial fluid collection. Vascular: No hyperdense vessel. Scattered vascular calcifications noted within the carotid siphons. Skull: Scalp soft tissues and calvarium within normal limits. Sinuses/Orbits: Globes orbital soft tissues demonstrate no acute finding. Paranasal sinuses are clear. No mastoid effusion. Other: None. ASPECTS ([place] Stroke Program Early CT Score) - Ganglionic level infarction (caudate, lentiform nuclei, internal capsule, insula, M1-M3 cortex): 7 - Supraganglionic infarction (M4-M6 cortex): 3 Total score (0-10 with 10 being normal): 10 Review of the MIP images confirms the above findings CTA NECK FINDINGS Aortic arch: Visualized aortic arch of normal caliber with normal 3 vessel morphology. Mild atheromatous plaque within the aortic arch. No hemodynamically significant stenosis seen about the origin of the great vessels. Visualized subclavian arteries widely patent. Right carotid system: Right CCA  tortuous proximally but widely patent to the bifurcation without stenosis. No significant atheromatous narrowing about the right bifurcation. Right ICA tortuous but widely patent to the skull base without stenosis, dissection, or occlusion. Left carotid system: Left CCA tortuous proximally but widely patent to the bifurcation without stenosis. No significant atheromatous narrowing about the left bifurcation. Left ICA tortuous but widely patent to the skull base without stenosis, dissection or occlusion. Vertebral arteries: Both vertebral arteries arise from the subclavian arteries. Vertebral arteries tortuous proximally but widely patent to the skull base without stenosis, dissection or occlusion. Skeleton: No acute osseous finding. No discrete lytic or blastic osseous lesions. Other neck: No other acute soft tissue abnormality within the neck. Subcentimeter hypodense left thyroid nodule noted, of doubtful significance. Torus palatini and torus mandibularis noted. Upper chest: Visualized upper chest demonstrates no acute finding. Left-sided pacemaker/AICD noted. Median sternotomy wires noted as well. Review of the MIP images confirms the above findings CTA HEAD FINDINGS Anterior circulation: Petrous segments widely patent. Mild atheromatous plaque within the cavernous/supraclinoid ICAs without hemodynamically significant stenosis. A1 segments widely patent. Normal anterior communicating artery. Anterior cerebral arteries widely patent to their distal aspects without stenosis. Right M1 widely patent. Normal right MCA bifurcation. Distal right MCA branches well perfused. Left M1 widely patent. Normal left MCA bifurcation. There is a focal abrupt occlusion of a distal left M3  branch, superior division (series 8, image 94). Irregular attenuated flow seen distally. Distal left MCA branches otherwise well perfused to their distal aspects. Posterior circulation: Both vertebral arteries widely patent to the vertebrobasilar  junction. Posteroinferior cerebral arteries patent bilaterally. Basilar widely patent to its distal aspect without stenosis. Superior cerebral arteries perfuse bilaterally. Both of the posterior cerebral arteries primarily supplied via the basilar and are well perfused to their distal aspects. Venous sinuses: Patent. Anatomic variants: None significant. Review of the MIP images confirms the above findings IMPRESSION: CT HEAD IMPRESSION: 1. No acute intracranial abnormality identified. 2. Aspects equals 10/10. 3. Age-related cerebral atrophy with mild chronic microvascular ischemic disease. CTA HEAD AND NECK IMPRESSION: 1. Positive CTA with abrupt occlusion of a distal left M3 branch, superior division. Finding likely embolic in nature. Irregular attenuated flow seen beyond the level of occlusion. 2. Mild atheromatous change for age elsewhere about the aortic arch and carotid siphons without hemodynamically significant stenosis. 3. Diffuse tortuosity about the proximal major arterial vasculature of the neck, suggesting chronic underlying hypertension. These results were communicated to Dr. Rory Percy at 10:41 pmon 7/27/2020by text page via the [facility] messaging system. Electronically Signed   By: Jeannine Boga M.D.   On: 09/10/2018 22:59    Pending Labs Unresulted Labs (From admission, onward)    Start     Ordered   09/11/18 0500  Hemoglobin A1c  Tomorrow morning,   R     09/11/18 0010   09/11/18 0500  Lipid panel  Tomorrow morning,   R    Comments: Fasting    09/11/18 0010   09/11/18 0500  CBC  Tomorrow morning,   R     09/11/18 0010   09/11/18 4970  Basic metabolic panel  Tomorrow morning,   R     09/11/18 0010   09/11/18 0010  Urine Culture  Add-on,   AD     09/11/18 0010   09/10/18 2249  SARS Coronavirus 2 (CEPHEID - Performed in [place] hospital lab), [facility] Order  Once,   STAT    Question:  Rule Out  Answer:  Yes   09/10/18 2248          Vitals/Pain Today's Vitals   09/10/18 2300  09/10/18 2315 09/10/18 2330 09/10/18 2345  BP: 115/62 118/73 98/65 104/75  Pulse: 79 73 82 75  Resp: (!) 25 (!) 24 17 (!) 29  Temp:      TempSrc:      SpO2: 95% 95% 94% 94%  Weight:      PainSc:        Isolation Precautions No active isolations  Medications Medications  aspirin chewable tablet 324 mg (has no administration in time range)  cefTRIAXone (ROCEPHIN) IVPB (has no administration in time range)  amLODipine-olmesartan (AZOR) 5-20 MG per tablet 1 tablet (has no administration in time range)  simvastatin (ZOCOR) tablet 10 mg (has no administration in time range)  ALPRAZolam (XANAX) tablet 0.25-0.5 mg (has no administration in time range)  pantoprazole (PROTONIX) EC tablet 20 mg (has no administration in time range)  Probiotic CAPS 1 capsule (has no administration in time range)  apixaban (ELIQUIS) tablet 2.5 mg (has no administration in time range)  cholecalciferol (VITAMIN D3) tablet 1,000 Units (has no administration in time range)   stroke: mapping our early stages of recovery book (has no administration in time range)  acetaminophen (TYLENOL) tablet 650 mg (has no administration in time range)    Or  acetaminophen (TYLENOL) solution 650 mg (  has no administration in time range)    Or  acetaminophen (TYLENOL) suppository 650 mg (has no administration in time range)  senna-docusate (Senokot-S) tablet 1 tablet (has no administration in time range)  iohexol (OMNIPAQUE) 350 MG/ML injection 75 mL (75 mLs Intravenous Contrast Given 09/10/18 2219)    Mobility walks with device High fall risk   Focused Assessments Neuro Assessment Handoff:  Swallow screen pass? Yes    NIH Stroke Scale ( + Modified Stroke Scale Criteria)  Interval: Initial Level of Consciousness (1a.)   : Alert, keenly responsive LOC Questions (1b. )   +: Answers both questions correctly LOC Commands (1c. )   + : Performs both tasks correctly Best Gaze (2. )  +: Normal Visual (3. )  +: No visual  loss Facial Palsy (4. )    : Normal symmetrical movements Motor Arm, Left (5a. )   +: No drift Motor Arm, Right (5b. )   +: No drift Motor Leg, Left (6a. )   +: No drift Motor Leg, Right (6b. )   +: No drift Limb Ataxia (7. ): Absent Sensory (8. )   +: Normal, no sensory loss Best Language (9. )   +: No aphasia Dysarthria (10. ): Mild-to-moderate dysarthria, patient slurs at least some words and, at worst, can be understood with some difficulty Extinction/Inattention (11.)   +: No Abnormality Modified SS Total  +: 0 Complete NIHSS TOTAL: 1 Last date known well: 09/10/18 Last time known well: 2100 Neuro Assessment: Within Defined Limits Neuro Checks:   Initial (09/10/18 2215)  Last Documented NIHSS Modified Score: 0 (09/10/18 2248) Has TPA been given? No If patient is a Neuro Trauma and patient is going to OR before floor call report to [facility] nurse: [phone] or [phone]     R Recommendations: See Admitting Provider Note  Report given to:   Additional Notes:

## 2018-09-11 NOTE — Evaluation (Signed)
Speech Language Pathology Evaluation Patient Details Name: [name] MRN: [id number] DOB: Jun 29, 1929 Today's Date: 09/11/2018 Time: 8527-7824 SLP Time Calculation (min) (ACUTE ONLY): 24 min  Problem List:  Patient Active Problem List   Diagnosis Date Noted  . CVA (cerebral vascular accident) ([place]) 09/11/2018  . Thrombus of atrial appendage 09/11/2018  . E. coli UTI 09/11/2018  . Bacteremia 09/01/2018  . Tachycardia-bradycardia syndrome ([place])   . Atrial fibrillation with slow ventricular response ([place])   . Pacemaker battery depletion 08/16/2018  . Fall 02/11/2018  . GERD (gastroesophageal reflux disease) 02/11/2018  . Anxiety 02/11/2018  . CKD (chronic kidney disease), stage III ([place]) 02/11/2018  . UTI (urinary tract infection) 02/11/2018  . Need for prophylactic vaccination and inoculation against influenza 11/19/2015  . Acute encephalopathy 01/31/2015  . Pneumonia, community acquired 01/28/2015  . PNA (pneumonia) 01/28/2015  . Pacemaker 05/06/2014  . Bilateral leg edema 08/20/2013  . S/P mitral valve replacement with bioprosthetic valve - 29 mm Biocor 04/22/2013  . Permanent atrial fibrillation 04/22/2013  . Essential hypertension 04/22/2013  . Hyperlipidemia 04/22/2013  . Hypercalcemia 11/28/2012  . Ureteral stone 08/02/2012  . Acute renal failure ([place]) 08/02/2012  . Thrombocytopenia ([place]) 01/20/2011  . Chronic diastolic congestive heart failure ([place]) 04/04/2007  . BRONCHOPNEUMONIA ORGANISM UNSPECIFIED 03/13/2007   Past Medical History:  Past Medical History:  Diagnosis Date  . Anxiety   . Arthritis    "mostly my hands" (01/29/2015)  . Basal cell carcinoma of forehead   . Benign essential tremor   . CAP (community acquired pneumonia) 01/29/2015  . CHF (congestive heart failure) ([place])   . Depression   . Edema of both legs 04-23-2013  pt states feet swelling (wears ted hose)   bilateral venous ablation procedures done in IR, L>R  . GERD (gastroesophageal  reflux disease)   . H/O hiatal hernia   . Hyperlipidemia   . Hyperparathyroidism ([place])    MILD--  STABLE  . Hypertension   . Kidney stones   . Osteopenia   . Permanent atrial fibrillation    CARDIOLOGIST-- DR [id number]  . Pneumonia "several times"  . Presence of permanent cardiac pacemaker    LAST PACER CHECK  04-23-2013  IN [facility]  . Right ureteral stone   . S/P mitral valve replacement with bioprosthetic valve    04-03-2009  . Schatzki's ring   . Sick sinus syndrome ([place])    S/P  PACEMAKER  2009  . Thrombocytopenia, immune ([place])    CHRONIC---  HEMOTOLOGIST---  DR ENNEVER  . Venous insufficiency, peripheral   . Wears glasses    Past Surgical History:  Past Surgical History:  Procedure Laterality Date  . CARDIAC CATHETERIZATION  03/27/2009   normal coronaries; grade IV Mitral Insuff.  Marland Kitchen CARDIAC CATHETERIZATION  2001  &  2006  . CARDIAC VALVE REPLACEMENT    . CATARACT EXTRACTION W/ INTRAOCULAR LENS  IMPLANT, BILATERAL Bilateral ~ 2012  . CYSTOSCOPY W/ URETERAL STENT PLACEMENT Left 08/01/2012   Procedure: CYSTOSCOPY WITH RETROGRADE PYELOGRAM/URETERAL STENT PLACEMENT;  Surgeon: Dutch Gray, MD;  Location: [facility];  Service: Urology;  Laterality: Left;  . CYSTOSCOPY WITH URETEROSCOPY Left 08/14/2012   Procedure: LEFT URETEROSCOPY WITH HOLMIUM LASER AND LEFT  STENT PLACEMENT;  Surgeon: Malka So, MD;  Location: [facility];  Service: Urology;  Laterality: Left;  . CYSTOSCOPY WITH URETEROSCOPY AND STENT PLACEMENT Right 04/25/2013   Procedure: RIGHT URETEROSCOPY WITH  STENT PLACEMENT;  Surgeon: Irine Seal, MD;  Location: [facility];  Service: Urology;  Laterality: Right;  . ESOPHAGOGASTRODUODENOSCOPY (EGD) WITH ESOPHAGEAL DILATION  "a couple times" (01/29/2015)  . EXTRACORPOREAL SHOCK WAVE LITHOTRIPSY Left 07-26-2012  . HAND SURGERY Right 05/2011   "no fracture; straightened things up  . HAND SURGERY Left 2014   "no fracture; straightened things up"  . HOLMIUM LASER  APPLICATION Left 07/18/8754   Procedure:  HOLMIUM LASER APPLICATION;  Surgeon: Malka So, MD;  Location: [facility];  Service: Urology;  Laterality: Left;  . HOLMIUM LASER APPLICATION Right 4/33/2951   Procedure: HOLMIUM LASER APPLICATION;  Surgeon: Irine Seal, MD;  Location[facility];  Service: Urology;  Laterality: Right;  . MITRAL VALVE REPLACEMENT WITH CHORDAL PRESEVATION USING BIOPROSTHETIC VALVE/  LIGATION LEFT ATRIAL APPENDAGE/  LEFT-SIDED MAZE PROCEDURE  04-03-2009  DR Ethel Rana,  SERIAL # [id number]  . NM MYOCAR PERF WALL MOTION  03/01/1999   mild lateral ischemia  . PERMANENT PACEMAKER INSERTION  02/26/2007   Dual Chamber [employer]onic Programmed VVIR--- Generator ADDR01/  #[id number] H   . PPM GENERATOR CHANGEOUT N/A 08/20/2018   Procedure: PPM GENERATOR CHANGEOUT;  Surgeon: Sanda Klein, MD;  Location: [facility];  Service: Cardiovascular;  Laterality: N/A;  . TEE WITHOUT CARDIOVERSION N/A 09/05/2018   Procedure: TRANSESOPHAGEAL ECHOCARDIOGRAM (TEE);  Surgeon: Pixie Casino, MD;  Location: [facility];  Service: Cardiovascular;  Laterality: N/A;  . TRANSTHORACIC ECHOCARDIOGRAM  09-07-2012  DR CROITORU   MILD LVH/  EF 55-60%/  MILD TO MODERATE AR/   SEVERE LAE  &  RAE/  MILD TO MODERATE TR/   MITRAL VALVE BIOPROSTHESIS LEAFLET NORMAL,  MODERATE STENOSIS WITH NO SIG. REGURG/  MODERATE RVE  . VAGINAL HYSTERECTOMY  1982   HPI:  Pt is an 85 y.o. female with medical history significant of paroxysmal atrial fibrillation, sick sinus syndrome status post PPM, hypertension, hyperlipidemia, chronic diastolic congestive heart failure, CKD stage III, GERD, essential tremors, basal cell carcinoma of forehead who presented to the hospital for evaluation of "garbled speech and difficulty recognizing common items".  CT of the head was negative for acute changes.    Assessment / Plan / Recommendation Clinical Impression  Pt reported that she was living with her son prior  to admission and that her daughter, [name], has been managing her medications and finances for the past five years. Pt denied any baseline deficits in language but stated that her speech has been imprecise since she changed her maxillary partial denture five years prior. Pt also endorsed having some baseline memory difficulty. With the pt's permission, pt's daughter, [name], was contacted via phone and she corroborated the pt's reports.   Her language skills are currently within normal limits despite but she did exhibit some difficulty with auditory comprehension due to memory. Her cognitive-linguistic skills are currently within functional limits considering her level of support at home and appear to be at baseline based on the daughter's report. Mild articulatory imprecision was noted but appears to be at the pt's baseline. Further skilled SLP services are not clinically indicated at this time. Pt, nursing, and her daughter were educated regarding this and all parties verbalized understanding as well as agreement with plan of care.     SLP Assessment  SLP Recommendation/Assessment: Patient does not need any further Speech Lanaguage Pathology Services SLP Visit Diagnosis: Dysarthria and anarthria (R47.1)    Follow Up Recommendations  None    Frequency and Duration           SLP Evaluation  Cognition  Overall Cognitive Status: History of cognitive impairments - at baseline Arousal/Alertness: Awake/alert Orientation Level: Oriented to place;Oriented to person;Oriented to situation;Disoriented to time(Oriented to month, year not date or day) Attention: Focused;Sustained Focused Attention: Appears intact Sustained Attention: Impaired Sustained Attention Impairment: Verbal complex Memory: Impaired Memory Impairment: Retrieval deficit;Storage deficit;Decreased recall of new information(Immediate: 2/3; delayed: 1/3 with cues: 1/2) Awareness: Appears intact Problem Solving: Impaired Problem Solving  Impairment: Verbal complex       Comprehension  Auditory Comprehension Overall Auditory Comprehension: Appears within functional limits for tasks assessed Yes/No Questions: Impaired Basic Immediate Environment Questions: (5/5) Complex Questions: (2/5) Paragraph Comprehension (via yes/no questions): Not tested Commands: Impaired Two Step Basic Commands: (4/4) Multistep Basic Commands: (2/4) Conversation: Complex Interfering Components: Attention;Working Research scientist (physical sciences) speed Reading Comprehension Reading Status: Not tested    Expression Expression Primary Mode of Expression: Verbal Verbal Expression Overall Verbal Expression: Appears within functional limits for tasks assessed Initiation: No impairment Automatic Speech: Counting;Day of week;Month of year(WNL) Level of Generative/Spontaneous Verbalization: Conversation Repetition: No impairment(Sentence: 5/5) Naming: No impairment Pragmatics: No impairment   Oral / Motor  Oral Motor/Sensory Function Overall Oral Motor/Sensory Function: Within functional limits Motor Speech Overall Motor Speech: Impaired at baseline Respiration: Within functional limits Phonation: Normal Resonance: Within functional limits Articulation: Impaired Level of Impairment: Conversation Intelligibility: Intelligible Motor Planning: Witnin functional limits Motor Speech Errors: Not applicable   Jackalynn Art I. Gongora Negus, [place], [place] Office number [phone] Pager [place] 09/11/2018, 11:35 AM

## 2018-09-11 NOTE — Progress Notes (Signed)
STROKE TEAM PROGRESS NOTE   INTERVAL HISTORY Her PT is at the bedside to work with her.  She was lying in bed initially, stated that her speech is at her baseline. She has no complains. Pending repeat CT in am.   Vitals:   09/11/18 0645 09/11/18 0820 09/11/18 1000 09/11/18 1050  BP: (!) 110/55 (!) 111/98 (!) 109/59 (!) 110/51  Pulse: (!) 59 72  73  Resp: 16 16  16   Temp: 98.2 F (36.8 C) 98.6 F (37 C) 98.5 F (36.9 C) (!) 97.5 F (36.4 C)  TempSrc: Oral Oral Oral Axillary  SpO2: 100% 99%  96%  Weight:      Height:        CBC:  Recent Labs  Lab 09/10/18 2205 09/10/18 2207 09/11/18 0413  WBC 6.2  --  5.1  NEUTROABS 3.6  --   --   HGB 11.9* 12.9 11.0*  HCT 38.2 38.0 34.7*  MCV 99.5  --  97.2  PLT 111*  --  100*    Basic Metabolic Panel:  Recent Labs  Lab 09/10/18 2205 09/10/18 2207 09/11/18 0413  NA 140 139 139  K 4.5 4.4 4.8  CL 104 103 104  CO2 26  --  29  GLUCOSE 132* 126* 115*  BUN 27* 29* 22  CREATININE 1.34* 1.30* 1.27*  CALCIUM 10.6*  --  10.3   Lipid Panel:     Component Value Date/Time   CHOL 143 09/11/2018 0413   TRIG 51 09/11/2018 0413   HDL 64 09/11/2018 0413   CHOLHDL 2.2 09/11/2018 0413   VLDL 10 09/11/2018 0413   LDLCALC 69 09/11/2018 0413   HgbA1c:  Lab Results  Component Value Date   HGBA1C 5.4 09/11/2018   Urine Drug Screen:     Component Value Date/Time   LABOPIA NONE DETECTED 09/10/2018 2308   COCAINSCRNUR NONE DETECTED 09/10/2018 2308   LABBENZ POSITIVE (A) 09/10/2018 2308   AMPHETMU NONE DETECTED 09/10/2018 2308   THCU NONE DETECTED 09/10/2018 2308   LABBARB NONE DETECTED 09/10/2018 2308    Alcohol Level     Component Value Date/Time   ETH <10 09/10/2018 2205    IMAGING Ct Angio Head W Or Wo Contrast  Result Date: 09/10/2018 CLINICAL DATA:  Code stroke. Initial evaluation for acute stroke, garbled speech. EXAM: CT ANGIOGRAPHY HEAD AND NECK TECHNIQUE: Multidetector CT imaging of the head and neck was performed  using the standard protocol during bolus administration of intravenous contrast. Multiplanar CT image reconstructions and MIPs were obtained to evaluate the vascular anatomy. Carotid stenosis measurements (when applicable) are obtained utilizing NASCET criteria, using the distal internal carotid diameter as the denominator. CONTRAST:  21mL OMNIPAQUE IOHEXOL 350 MG/ML SOLN COMPARISON:  Prior CT from 02/11/2018. FINDINGS: CT HEAD FINDINGS Brain: Diffuse prominence of the CSF containing spaces compatible generalized age-related cerebral atrophy. Mild chronic microvascular ischemic disease. No acute intracranial hemorrhage. No acute large vessel territory infarct. No mass lesion, midline shift or mass effect. No hydrocephalus. No extra-axial fluid collection. Vascular: No hyperdense vessel. Scattered vascular calcifications noted within the carotid siphons. Skull: Scalp soft tissues and calvarium within normal limits. Sinuses/Orbits: Globes orbital soft tissues demonstrate no acute finding. Paranasal sinuses are clear. No mastoid effusion. Other: None. ASPECTS [facility] Stroke Program Early CT Score) - Ganglionic level infarction (caudate, lentiform nuclei, internal capsule, insula, M1-M3 cortex): 7 - Supraganglionic infarction (M4-M6 cortex): 3 Total score (0-10 with 10 being normal): 10 Review of the MIP images confirms the above findings  CTA NECK FINDINGS Aortic arch: Visualized aortic arch of normal caliber with normal 3 vessel morphology. Mild atheromatous plaque within the aortic arch. No hemodynamically significant stenosis seen about the origin of the great vessels. Visualized subclavian arteries widely patent. Right carotid system: Right CCA tortuous proximally but widely patent to the bifurcation without stenosis. No significant atheromatous narrowing about the right bifurcation. Right ICA tortuous but widely patent to the skull base without stenosis, dissection, or occlusion. Left carotid system: Left CCA  tortuous proximally but widely patent to the bifurcation without stenosis. No significant atheromatous narrowing about the left bifurcation. Left ICA tortuous but widely patent to the skull base without stenosis, dissection or occlusion. Vertebral arteries: Both vertebral arteries arise from the subclavian arteries. Vertebral arteries tortuous proximally but widely patent to the skull base without stenosis, dissection or occlusion. Skeleton: No acute osseous finding. No discrete lytic or blastic osseous lesions. Other neck: No other acute soft tissue abnormality within the neck. Subcentimeter hypodense left thyroid nodule noted, of doubtful significance. Torus palatini and torus mandibularis noted. Upper chest: Visualized upper chest demonstrates no acute finding. Left-sided pacemaker/AICD noted. Median sternotomy wires noted as well. Review of the MIP images confirms the above findings CTA HEAD FINDINGS Anterior circulation: Petrous segments widely patent. Mild atheromatous plaque within the cavernous/supraclinoid ICAs without hemodynamically significant stenosis. A1 segments widely patent. Normal anterior communicating artery. Anterior cerebral arteries widely patent to their distal aspects without stenosis. Right M1 widely patent. Normal right MCA bifurcation. Distal right MCA branches well perfused. Left M1 widely patent. Normal left MCA bifurcation. There is a focal abrupt occlusion of a distal left M3 branch, superior division (series 8, image 94). Irregular attenuated flow seen distally. Distal left MCA branches otherwise well perfused to their distal aspects. Posterior circulation: Both vertebral arteries widely patent to the vertebrobasilar junction. Posteroinferior cerebral arteries patent bilaterally. Basilar widely patent to its distal aspect without stenosis. Superior cerebral arteries perfuse bilaterally. Both of the posterior cerebral arteries primarily supplied via the basilar and are well perfused  to their distal aspects. Venous sinuses: Patent. Anatomic variants: None significant. Review of the MIP images confirms the above findings IMPRESSION: CT HEAD IMPRESSION: 1. No acute intracranial abnormality identified. 2. Aspects equals 10/10. 3. Age-related cerebral atrophy with mild chronic microvascular ischemic disease. CTA HEAD AND NECK IMPRESSION: 1. Positive CTA with abrupt occlusion of a distal left M3 branch, superior division. Finding likely embolic in nature. Irregular attenuated flow seen beyond the level of occlusion. 2. Mild atheromatous change for age elsewhere about the aortic arch and carotid siphons without hemodynamically significant stenosis. 3. Diffuse tortuosity about the proximal major arterial vasculature of the neck, suggesting chronic underlying hypertension. These results were communicated to Dr. Rory Percy at 10:41 pmon 7/27/2020by text page via the [facility] messaging system. Electronically Signed   By: Jeannine Boga M.D.   On: 09/10/2018 22:59   Ct Angio Neck W Or Wo Contrast  Result Date: 09/10/2018 CLINICAL DATA:  Code stroke. Initial evaluation for acute stroke, garbled speech. EXAM: CT ANGIOGRAPHY HEAD AND NECK TECHNIQUE: Multidetector CT imaging of the head and neck was performed using the standard protocol during bolus administration of intravenous contrast. Multiplanar CT image reconstructions and MIPs were obtained to evaluate the vascular anatomy. Carotid stenosis measurements (when applicable) are obtained utilizing NASCET criteria, using the distal internal carotid diameter as the denominator. CONTRAST:  2mL OMNIPAQUE IOHEXOL 350 MG/ML SOLN COMPARISON:  Prior CT from 02/11/2018. FINDINGS: CT HEAD FINDINGS Brain: Diffuse prominence of the CSF  containing spaces compatible generalized age-related cerebral atrophy. Mild chronic microvascular ischemic disease. No acute intracranial hemorrhage. No acute large vessel territory infarct. No mass lesion, midline shift or mass  effect. No hydrocephalus. No extra-axial fluid collection. Vascular: No hyperdense vessel. Scattered vascular calcifications noted within the carotid siphons. Skull: Scalp soft tissues and calvarium within normal limits. Sinuses/Orbits: Globes orbital soft tissues demonstrate no acute finding. Paranasal sinuses are clear. No mastoid effusion. Other: None. ASPECTS ([place] Stroke Program Early CT Score) - Ganglionic level infarction (caudate, lentiform nuclei, internal capsule, insula, M1-M3 cortex): 7 - Supraganglionic infarction (M4-M6 cortex): 3 Total score (0-10 with 10 being normal): 10 Review of the MIP images confirms the above findings CTA NECK FINDINGS Aortic arch: Visualized aortic arch of normal caliber with normal 3 vessel morphology. Mild atheromatous plaque within the aortic arch. No hemodynamically significant stenosis seen about the origin of the great vessels. Visualized subclavian arteries widely patent. Right carotid system: Right CCA tortuous proximally but widely patent to the bifurcation without stenosis. No significant atheromatous narrowing about the right bifurcation. Right ICA tortuous but widely patent to the skull base without stenosis, dissection, or occlusion. Left carotid system: Left CCA tortuous proximally but widely patent to the bifurcation without stenosis. No significant atheromatous narrowing about the left bifurcation. Left ICA tortuous but widely patent to the skull base without stenosis, dissection or occlusion. Vertebral arteries: Both vertebral arteries arise from the subclavian arteries. Vertebral arteries tortuous proximally but widely patent to the skull base without stenosis, dissection or occlusion. Skeleton: No acute osseous finding. No discrete lytic or blastic osseous lesions. Other neck: No other acute soft tissue abnormality within the neck. Subcentimeter hypodense left thyroid nodule noted, of doubtful significance. Torus palatini and torus mandibularis noted.  Upper chest: Visualized upper chest demonstrates no acute finding. Left-sided pacemaker/AICD noted. Median sternotomy wires noted as well. Review of the MIP images confirms the above findings CTA HEAD FINDINGS Anterior circulation: Petrous segments widely patent. Mild atheromatous plaque within the cavernous/supraclinoid ICAs without hemodynamically significant stenosis. A1 segments widely patent. Normal anterior communicating artery. Anterior cerebral arteries widely patent to their distal aspects without stenosis. Right M1 widely patent. Normal right MCA bifurcation. Distal right MCA branches well perfused. Left M1 widely patent. Normal left MCA bifurcation. There is a focal abrupt occlusion of a distal left M3 branch, superior division (series 8, image 94). Irregular attenuated flow seen distally. Distal left MCA branches otherwise well perfused to their distal aspects. Posterior circulation: Both vertebral arteries widely patent to the vertebrobasilar junction. Posteroinferior cerebral arteries patent bilaterally. Basilar widely patent to its distal aspect without stenosis. Superior cerebral arteries perfuse bilaterally. Both of the posterior cerebral arteries primarily supplied via the basilar and are well perfused to their distal aspects. Venous sinuses: Patent. Anatomic variants: None significant. Review of the MIP images confirms the above findings IMPRESSION: CT HEAD IMPRESSION: 1. No acute intracranial abnormality identified. 2. Aspects equals 10/10. 3. Age-related cerebral atrophy with mild chronic microvascular ischemic disease. CTA HEAD AND NECK IMPRESSION: 1. Positive CTA with abrupt occlusion of a distal left M3 branch, superior division. Finding likely embolic in nature. Irregular attenuated flow seen beyond the level of occlusion. 2. Mild atheromatous change for age elsewhere about the aortic arch and carotid siphons without hemodynamically significant stenosis. 3. Diffuse tortuosity about the  proximal major arterial vasculature of the neck, suggesting chronic underlying hypertension. These results were communicated to Dr. Rory Percy at 10:41 pmon 7/27/2020by text page via the [facility] messaging system. Electronically Signed  By: Jeannine Boga M.D.   On: 09/10/2018 22:59   Ct Head Code Stroke Wo Contrast  Result Date: 09/10/2018 CLINICAL DATA:  Code stroke. Initial evaluation for acute stroke, garbled speech. EXAM: CT ANGIOGRAPHY HEAD AND NECK TECHNIQUE: Multidetector CT imaging of the head and neck was performed using the standard protocol during bolus administration of intravenous contrast. Multiplanar CT image reconstructions and MIPs were obtained to evaluate the vascular anatomy. Carotid stenosis measurements (when applicable) are obtained utilizing NASCET criteria, using the distal internal carotid diameter as the denominator. CONTRAST:  33mL OMNIPAQUE IOHEXOL 350 MG/ML SOLN COMPARISON:  Prior CT from 02/11/2018. FINDINGS: CT HEAD FINDINGS Brain: Diffuse prominence of the CSF containing spaces compatible generalized age-related cerebral atrophy. Mild chronic microvascular ischemic disease. No acute intracranial hemorrhage. No acute large vessel territory infarct. No mass lesion, midline shift or mass effect. No hydrocephalus. No extra-axial fluid collection. Vascular: No hyperdense vessel. Scattered vascular calcifications noted within the carotid siphons. Skull: Scalp soft tissues and calvarium within normal limits. Sinuses/Orbits: Globes orbital soft tissues demonstrate no acute finding. Paranasal sinuses are clear. No mastoid effusion. Other: None. ASPECTS ([place] Stroke Program Early CT Score) - Ganglionic level infarction (caudate, lentiform nuclei, internal capsule, insula, M1-M3 cortex): 7 - Supraganglionic infarction (M4-M6 cortex): 3 Total score (0-10 with 10 being normal): 10 Review of the MIP images confirms the above findings CTA NECK FINDINGS Aortic arch: Visualized aortic arch of  normal caliber with normal 3 vessel morphology. Mild atheromatous plaque within the aortic arch. No hemodynamically significant stenosis seen about the origin of the great vessels. Visualized subclavian arteries widely patent. Right carotid system: Right CCA tortuous proximally but widely patent to the bifurcation without stenosis. No significant atheromatous narrowing about the right bifurcation. Right ICA tortuous but widely patent to the skull base without stenosis, dissection, or occlusion. Left carotid system: Left CCA tortuous proximally but widely patent to the bifurcation without stenosis. No significant atheromatous narrowing about the left bifurcation. Left ICA tortuous but widely patent to the skull base without stenosis, dissection or occlusion. Vertebral arteries: Both vertebral arteries arise from the subclavian arteries. Vertebral arteries tortuous proximally but widely patent to the skull base without stenosis, dissection or occlusion. Skeleton: No acute osseous finding. No discrete lytic or blastic osseous lesions. Other neck: No other acute soft tissue abnormality within the neck. Subcentimeter hypodense left thyroid nodule noted, of doubtful significance. Torus palatini and torus mandibularis noted. Upper chest: Visualized upper chest demonstrates no acute finding. Left-sided pacemaker/AICD noted. Median sternotomy wires noted as well. Review of the MIP images confirms the above findings CTA HEAD FINDINGS Anterior circulation: Petrous segments widely patent. Mild atheromatous plaque within the cavernous/supraclinoid ICAs without hemodynamically significant stenosis. A1 segments widely patent. Normal anterior communicating artery. Anterior cerebral arteries widely patent to their distal aspects without stenosis. Right M1 widely patent. Normal right MCA bifurcation. Distal right MCA branches well perfused. Left M1 widely patent. Normal left MCA bifurcation. There is a focal abrupt occlusion of a  distal left M3 branch, superior division (series 8, image 94). Irregular attenuated flow seen distally. Distal left MCA branches otherwise well perfused to their distal aspects. Posterior circulation: Both vertebral arteries widely patent to the vertebrobasilar junction. Posteroinferior cerebral arteries patent bilaterally. Basilar widely patent to its distal aspect without stenosis. Superior cerebral arteries perfuse bilaterally. Both of the posterior cerebral arteries primarily supplied via the basilar and are well perfused to their distal aspects. Venous sinuses: Patent. Anatomic variants: None significant. Review of the MIP images  confirms the above findings IMPRESSION: CT HEAD IMPRESSION: 1. No acute intracranial abnormality identified. 2. Aspects equals 10/10. 3. Age-related cerebral atrophy with mild chronic microvascular ischemic disease. CTA HEAD AND NECK IMPRESSION: 1. Positive CTA with abrupt occlusion of a distal left M3 branch, superior division. Finding likely embolic in nature. Irregular attenuated flow seen beyond the level of occlusion. 2. Mild atheromatous change for age elsewhere about the aortic arch and carotid siphons without hemodynamically significant stenosis. 3. Diffuse tortuosity about the proximal major arterial vasculature of the neck, suggesting chronic underlying hypertension. These results were communicated to Dr. Rory Percy at 10:41 pmon 7/27/2020by text page via the [facility] messaging system. Electronically Signed   By: Jeannine Boga M.D.   On: 09/10/2018 22:59    PHYSICAL EXAM  Temp:  [97.5 F (36.4 C)-98.6 F (37 C)] 97.5 F (36.4 C) (07/28 1050) Pulse Rate:  [59-93] 73 (07/28 1050) Resp:  [15-29] 16 (07/28 1050) BP: (98-134)/(51-98) 110/51 (07/28 1050) SpO2:  [93 %-100 %] 96 % (07/28 1050) Weight:  [72.2 kg] 72.2 kg (07/27 2240)  General - frail elder lady, thin built, well developed, in no apparent distress.  Ophthalmologic - fundi not visualized due to  noncooperation.  Cardiovascular - irregularly irregular heart rate and rhythm.  Mental Status -  Level of arousal and orientation to age, year, place, and person were intact, but not orientated to month. Language including expression, naming, repetition, comprehension was assessed and found intact, mild dysarthria.  Cranial Nerves II - XII - II - Visual field intact OU. III, IV, VI - Extraocular movements intact. V - Facial sensation intact bilaterally. VII - Facial movement intact bilaterally. VIII - Hearing & vestibular intact bilaterally. X - Palate elevates symmetrically, mild dysarthria, could be chronic at baseline. XI - Chin turning & shoulder shrug intact bilaterally. XII - Tongue protrusion intact.  Motor Strength - The patient's strength was symmetrical in all extremities and pronator drift was absent. Deformity of b/l hand joints, L>R.  Bulk was decreased b/l hands and fasciculations were absent.   Motor Tone - Muscle tone was assessed at the neck and appendages and was normal.  Reflexes - The patient's reflexes were symmetrical in all extremities and she had no pathological reflexes.  Sensory - Light touch, temperature/pinprick were assessed and were symmetrical.    Coordination - The patient had normal movements in the hands with no ataxia or dysmetria.  Essential tremor present bilaterally, mild.  Gait and Station - deferred.   ASSESSMENT/PLAN [name] is a 85 y.o. female with history of AF,  congestive heart failure, depression, hyperlipidemia, hypertension, hyperparathyroidism, essential tremor, arthritis, sick sinus syndrome status post pacemaker placement in 2009 and replacement of the pacemaker a week ago sent home with PICC with abx treatment for bacteremia and Eliquis 2.5 bid for a LA clot presenting with increasing frailty, tremors and with garbled speech.   Small stroke vs TIA - likely related to LAA thrombus on underdosed [facility]  Code Stroke CT head  No acute abnormality.   CTA head & neck distal L M3 branch occlusion superior division. Mild atherosclerosis ICA and aortic arch. Diffused tortuosity of neck.  Repeat CT head  Pending in am  Recent TEE 7/22 large LA thrombus on the atrial septus. LA severely dilated. EF 60-65%. No endocarditis  LE dopplers 7/23 negative for DVT  LDL 69  HgbA1c 5.4  Eliquis for VTE prophylaxis  Eliquis (apixaban) daily 2.5 mg bid prior to admission, now on Eliquis (  apixaban) daily 2.5 mg bid. Recommend increase to therapeutic dose of 5 mg bid after repeat CT shows no large infarct  Therapy recommendations:  pending   Disposition:  pending   Paroxsymal Atrial Fibrillation  Home anticoagulation: on  Eliquis (apixaban) daily  2.5 bid continued in the hospital  LA thrombus found on TEE 7/22  Will increase eliquis to 5mg  bid after CT shows no large infarct   Hypertension  Stable . Permissive hypertension (OK if < 180/105) but gradually normalize in 3-5 days . Long-term BP goal normotensive  Hyperlipidemia  Home meds:  ometa 3 and zocor 10, zocor 10 resumed in hospital  LDL 69, goal < 70  Continue statin at discharge  Thrombocytopenia   Followed with hematologist in [place]  PLT 96->111->100  Not on antiplatelet  OK to continue [facility] with eliquis  Other Stroke Risk Factors  Advanced age  Chronic diastolic congestive heart failure  SSS s/p pacer in 2009, replaced last week  Other Active Problems  Recent bacteremia - Streptococcus agalactiae bacteremia - PICC and Rocephin x 6 wks thru 09/29/2018. No endocarditis  CKD stage III Cr 1.27  E coli UTI  Anxiety   Anemia Hb 11  Hospital day # 0  Rosalin Hawking, MD PhD Stroke Neurology 09/11/2018 3:04 PM   To contact Stroke Continuity provider, please refer to [email or web address] After hours, contact General Neurology

## 2018-09-11 NOTE — Progress Notes (Signed)
I spoke with the patient's son about patient's condition, plan of care and all questions were addressed. 

## 2018-09-11 NOTE — Progress Notes (Signed)
PROGRESS NOTE    [name]  NGE:[id number] DOB: Dec 12, 1929 DOA: 09/10/2018 PCP: Lawerance Cruel, MD    Brief Narrative:  85 year old female who presented with focal neurologic deficits.  She does have significant past medical history for paroxysmal atrial fibrillation, sick sinus syndrome status post pacemaker, hypertension, dyslipidemia, chronic diastolic heart failure, chronic kidney disease stage III, GERD, essential tremors and basal cell carcinoma of the forehead. Patient presented to the hospital with slurred speech and difficulty recognizing common items.  Acute onset of symptoms, resolved by the time EMS arrived.  She had recurrent symptoms for about 10 minutes, which self resolved.  Witnessed by EMS.  Patient is known to have a atrial thrombi per transesophageal echocardiography, taking apixaban 2.5 g twice daily.  On her initial physical examination blood pressure 106/72, heart rate 60, respiratory rate 20, temperature 97.6, oxygen saturation 96%, her lungs are clear to auscultation bilaterally, heart S1-S2 present with me, abdomen soft, no lower extremity edema, she had mild dysarthria apparently baseline per patient, her strength was preserved.  Sodium 140, potassium 4.5, chloride 104, bicarb 26, glucose 132, BUN 27, creatinine 1.34, white count 6.2, hemoglobin 11.9, hematocrit 38.2, platelets 111.  SARS COVID-19 negative.  Urine analysis 11-20 white cells, 0-5 red cells.  Head CT negative for acute changes, CT angiography with abrupt occlusion of the distal left M3 branch superior division.  Likely embolic.  EKG with baseline artifact, 119 bpm, left axis deviation, a sensed, V paced, positive PVC.  Patient was admitted to the hospital with a working diagnosis acute CVA.  Assessment & Plan:   Principal Problem:   CVA (cerebral vascular accident) ([place]) Active Problems:   Essential hypertension   Bacteremia   Thrombus of atrial appendage   E. coli UTI   1. Acute CVA.  Patient continue to have dysarthria, blood pressure 109 to 401 mmHg systolic. Unable to get brain MRI. Will continue neuro checks, physical and speech therapy. Case discussed with Dr. Erlinda Hong from neurology, will repeat head CT in 24 h, patient may need full anticoagulation in the setting of acute CVA, known atrial thrombi and atrial fibrillation. Continue simvastatin.    2. HTN. Blood pressure systolic 027 mmHg, will continue to hold on antihypertensive agents for now. Continue blood pressure monitoring.   3. Streptococcus agalactiae bacteremia. No signs of recurrent infection, patient has a Picc line in her right upper extremity, continue antibiotic therapy with ceftriaxone until 09/29/18.   4. Paroxysmal atrial fibrillation. Patient with paced rhythm, ( V paced), continue telemetry monitoring and anticoagulation with apixaban.  5. Chronic diastolic heart failure. Clinically euvolemic, will continue to hold on furosemide for now, continue blood pressure monitoring.   6. AKI. Renal function with serum cr at 1,27 from 1,34, baseline 0,93 to 1,1, Will continue to hold on furosemide, avoid hypotension and nephrotoxic medications.    DVT prophylaxis: apixaban   Code Status:  full Family Communication: no family at the bedside  Disposition Plan/ discharge barriers: pending clinical improvement   Body mass index is 28.2 kg/m. Malnutrition Type:      Malnutrition Characteristics:      Nutrition Interventions:     RN Pressure Injury Documentation:     Consultants:   Neurology   Procedures:     Antimicrobials:   ceftriaxone IV     Subjective: Patient is feeling better but not yet back to baseline, continue to have slurred speech, no nausea or vomiting, no chest pain or dyspnea.   Objective: Vitals:  09/11/18 0534 09/11/18 0645 09/11/18 0820 09/11/18 1000  BP:  (!) 110/55 (!) 111/98 (!) 109/59  Pulse:  (!) 59 72   Resp:  16 16   Temp:  98.2 F (36.8 C) 98.6 F (37 C)  98.5 F (36.9 C)  TempSrc:  Oral Oral Oral  SpO2:  100% 99%   Weight:      Height: 5\' 3"  (1.6 m)      No intake or output data in the 24 hours ending 09/11/18 1043 Filed Weights   09/10/18 2200 09/10/18 2240  Weight: 72.2 kg 72.2 kg    Examination:   General: deconditioned  Neurology: Awake and alert, positive slurred speech, preserved strength 4 extremities.  E ENT: mild pallor, no icterus, oral mucosa moist Cardiovascular: No JVD. S1-S2 present, rhythmic, no gallops, rubs, or murmurs. No lower extremity edema. Pulmonary: vesicular breath sounds bilaterally, adequate air movement, no wheezing, rhonchi or rales. Gastrointestinal. Abdomen with, no organomegaly, non tender, no rebound or guarding Skin. No rashes Musculoskeletal: no joint deformities     Data Reviewed: I have personally reviewed following labs and imaging studies  CBC: Recent Labs  Lab 09/05/18 0329 09/06/18 0232 09/10/18 2205 09/10/18 2207 09/11/18 0413  WBC 5.3  --  6.2  --  5.1  NEUTROABS  --   --  3.6  --   --   HGB 11.5* 11.5* 11.9* 12.9 11.0*  HCT 35.8* 36.1 38.2 38.0 34.7*  MCV 96.0  --  99.5  --  97.2  PLT 97* 96* 111*  --  992*   Basic Metabolic Panel: Recent Labs  Lab 09/05/18 0329 09/06/18 0232 09/10/18 2205 09/10/18 2207 09/11/18 0413  NA 138 140 140 139 139  K 5.5* 4.6 4.5 4.4 4.8  CL 107 109 104 103 104  CO2 26 24 26   --  29  GLUCOSE 94 82 132* 126* 115*  BUN 27* 21 27* 29* 22  CREATININE 1.17* 0.93 1.34* 1.30* 1.27*  CALCIUM 10.3 9.9 10.6*  --  10.3   GFR: Estimated Creatinine Clearance: 29.7 mL/min (A) (by C-G formula based on SCr of 1.27 mg/dL (H)). Liver Function Tests: Recent Labs  Lab 09/10/18 2205  AST 33  ALT 24  ALKPHOS 51  BILITOT 0.4  PROT 5.7*  ALBUMIN 3.5   No results for input(s): LIPASE, AMYLASE in the last 168 hours. No results for input(s): AMMONIA in the last 168 hours. Coagulation Profile: Recent Labs  Lab 09/10/18 2205  INR 1.1   Cardiac  Enzymes: No results for input(s): CKTOTAL, CKMB, CKMBINDEX, TROPONINI in the last 168 hours. BNP (last 3 results) No results for input(s): PROBNP in the last 8760 hours. HbA1C: Recent Labs    09/11/18 0413  HGBA1C 5.4   CBG: Recent Labs  Lab 09/10/18 2203  GLUCAP 125*   Lipid Profile: Recent Labs    09/11/18 0413  CHOL 143  HDL 64  LDLCALC 69  TRIG 51  CHOLHDL 2.2   Thyroid Function Tests: No results for input(s): TSH, T4TOTAL, FREET4, T3FREE, THYROIDAB in the last 72 hours. Anemia Panel: No results for input(s): VITAMINB12, FOLATE, FERRITIN, TIBC, IRON, RETICCTPCT in the last 72 hours.    Radiology Studies: I have reviewed all of the imaging during this hospital visit personally     Scheduled Meds: . ALPRAZolam  0.25 mg Oral Daily  . ALPRAZolam  0.5 mg Oral QHS  . amLODipine  5 mg Oral Daily  . apixaban  2.5 mg Oral BID  .  cholecalciferol  1,000 Units Oral Daily  . pantoprazole  20 mg Oral BID  . [START ON 09/12/2018] simvastatin  10 mg Oral QHS   Continuous Infusions: . cefTRIAXone (ROCEPHIN)  IV       LOS: 0 days        Merissa Renwick Gerome Apley, MD

## 2018-09-12 ENCOUNTER — Inpatient Hospital Stay (HOSPITAL_COMMUNITY): Payer: Medicare Other

## 2018-09-12 DIAGNOSIS — N390 Urinary tract infection, site not specified: Secondary | ICD-10-CM

## 2018-09-12 DIAGNOSIS — B9620 Unspecified Escherichia coli [E. coli] as the cause of diseases classified elsewhere: Secondary | ICD-10-CM

## 2018-09-12 LAB — BASIC METABOLIC PANEL
Anion gap: 6 (ref 5–15)
BUN: 17 mg/dL (ref 8–23)
CO2: 27 mmol/L (ref 22–32)
Calcium: 10.3 mg/dL (ref 8.9–10.3)
Chloride: 107 mmol/L (ref 98–111)
Creatinine, Ser: 1.01 mg/dL — ABNORMAL HIGH (ref 0.44–1.00)
GFR calc Af Amer: 58 mL/min — ABNORMAL LOW (ref >60)
GFR calc non Af Amer: 50 mL/min — ABNORMAL LOW (ref >60)
Glucose, Bld: 127 mg/dL — ABNORMAL HIGH (ref 70–99)
Potassium: 4.4 mmol/L (ref 3.5–5.1)
Sodium: 140 mmol/L (ref 135–145)

## 2018-09-12 LAB — URINE CULTURE: Culture: NO GROWTH

## 2018-09-12 MED ORDER — HYDRALAZINE HCL 20 MG/ML IJ SOLN: 5.0000 mg | Freq: Once | INTRAMUSCULAR | Status: DC

## 2018-09-12 MED ORDER — APIXABAN 5 MG PO TABS
5.0000 mg | ORAL_TABLET | Freq: Two times a day (BID) | ORAL | Status: DC
  Administered 2018-09-12 – 2018-09-14 (×4): 5 mg via ORAL
  Filled 2018-09-12 (×4): qty 1

## 2018-09-12 MED ORDER — HYDRALAZINE HCL 20 MG/ML IJ SOLN
5.0000 mg | Freq: Once | INTRAMUSCULAR | Status: AC
  Administered 2018-09-12: 5 mg via INTRAVENOUS
  Filled 2018-09-12: qty 1

## 2018-09-12 NOTE — Progress Notes (Signed)
Pt's BP is elevated. On-call provider has been notified via [place]. Waiting for response

## 2018-09-12 NOTE — Progress Notes (Signed)
PROGRESS NOTE    [name]  EVO:[id number] DOB: 1929/05/12 DOA: 09/10/2018 PCP: Lawerance Cruel, MD    Brief Narrative:  85 year old female who presented with focal neurologic deficits.  She does have significant past medical history for paroxysmal atrial fibrillation, sick sinus syndrome status post pacemaker, hypertension, dyslipidemia, chronic diastolic heart failure, chronic kidney disease stage III, GERD, essential tremors and basal cell carcinoma of the forehead. Patient presented to the hospital with slurred speech and difficulty recognizing common items.  Acute onset of symptoms, resolved by the time EMS arrived.  She had recurrent symptoms for about 10 minutes, which self resolved.  Witnessed by EMS.  Patient is known to have a atrial thrombi per transesophageal echocardiography, taking apixaban 2.5 g twice daily.  On her initial physical examination blood pressure 106/72, heart rate 60, respiratory rate 20, temperature 97.6, oxygen saturation 96%, her lungs are clear to auscultation bilaterally, heart S1-S2 present with me, abdomen soft, no lower extremity edema, she had mild dysarthria apparently baseline per patient, her strength was preserved.  Sodium 140, potassium 4.5, chloride 104, bicarb 26, glucose 132, BUN 27, creatinine 1.34, white count 6.2, hemoglobin 11.9, hematocrit 38.2, platelets 111.  SARS COVID-19 negative.  Urine analysis 11-20 white cells, 0-5 red cells.  Head CT negative for acute changes, CT angiography with abrupt occlusion of the distal left M3 branch superior division.  Likely embolic.  EKG with baseline artifact, 119 bpm, left axis deviation, a sensed, V paced, positive PVC.  Patient was admitted to the hospital with a working diagnosis acute CVA.   Assessment & Plan:   Principal Problem:   CVA (cerebral vascular accident) ([place]) Active Problems:   Essential hypertension   Bacteremia   Thrombus of atrial appendage   E. coli UTI    1.  Acute CVA/ small new cortical infarct in the left middle frontal gyrus. Follow up CT positive for acute ischemic infarct. Patient clinically improving but not yet back to baseline. Physical therapy recommendation for SNF. LDL 69. Patient will need full dose of apixaban and continue with statin therapy. Continue neuro checks.   2. HTN. Blood pressure 119 mmHg, will continue to hold on antihypertensive agents, continue blood pressure monitoring.   3. Streptococcus agalactiae bacteremia. Continue with antibiotic therapy with ceftriaxone until 09/29/18. Has a picc line in the right upper extremity.   4. Paroxysmal atrial fibrillation.  Telemetry monitoring is V paced, rate 80. Continue anticoagulation with full dose apixaban.   5. Chronic diastolic heart failure. No clinical signs of exacerbation, continue to hold on diuretics.  6. AKI. Continue to improve renal function, with serum cr at 1,0 with K at 4,4 and serum bicarbonate at 27. F   DVT prophylaxis: apixaban   Code Status:  full Family Communication: no family at the bedside  Disposition Plan/ discharge barriers: plan for dc home in am with home health or SNF.       Body mass index is 28.2 kg/m. Malnutrition Type:      Malnutrition Characteristics:      Nutrition Interventions:     RN Pressure Injury Documentation:     Consultants:   Neurology   Procedures:     Antimicrobials:   Ceftriaxone IV     Subjective: Patient reports improvement in her dysarthria, no nausea or vomiting, no chest pain or dyspnea. Continue to have resting tremors.   Objective: Vitals:   09/12/18 0140 09/12/18 0212 09/12/18 0341 09/12/18 0733  BP: (!) 132/112 (!) 174/135 Marland Kitchen)  112/52 112/65  Pulse: 69 72  85  Resp: 16 20  20   Temp:   98.7 F (37.1 C) 98.2 F (36.8 C)  TempSrc:   Oral Oral  SpO2: 100% 100%  99%  Weight:      Height:        Intake/Output Summary (Last 24 hours) at 09/12/2018 1039 Last data filed at  09/11/2018 2112 Gross per 24 hour  Intake 100 ml  Output -  Net 100 ml   Filed Weights   09/10/18 2200 09/10/18 2240  Weight: 72.2 kg 72.2 kg    Examination:   General: not in pain or dyspnea.  Neurology: Awake and alert, slow speech but no frank dysarthria, positive resting tremors.   E ENT: mild pallor, no icterus, oral mucosa moist Cardiovascular: No JVD. S1-S2 present, rhythmic, no gallops, rubs, or murmurs. No lower extremity edema. Pulmonary: positive breath sounds bilaterally, adequate air movement, no wheezing, rhonchi or rales. Gastrointestinal. Abdomen with no organomegaly, non tender, no rebound or guarding Skin. No rashes Musculoskeletal: hands with proximal MCP joints with cubital deformities.      Data Reviewed: I have personally reviewed following labs and imaging studies  CBC: Recent Labs  Lab 09/06/18 0232 09/10/18 2205 09/10/18 2207 09/11/18 0413  WBC  --  6.2  --  5.1  NEUTROABS  --  3.6  --   --   HGB 11.5* 11.9* 12.9 11.0*  HCT 36.1 38.2 38.0 34.7*  MCV  --  99.5  --  97.2  PLT 96* 111*  --  970*   Basic Metabolic Panel: Recent Labs  Lab 09/06/18 0232 09/10/18 2205 09/10/18 2207 09/11/18 0413  NA 140 140 139 139  K 4.6 4.5 4.4 4.8  CL 109 104 103 104  CO2 24 26  --  29  GLUCOSE 82 132* 126* 115*  BUN 21 27* 29* 22  CREATININE 0.93 1.34* 1.30* 1.27*  CALCIUM 9.9 10.6*  --  10.3   GFR: Estimated Creatinine Clearance: 29.7 mL/min (A) (by C-G formula based on SCr of 1.27 mg/dL (H)). Liver Function Tests: Recent Labs  Lab 09/10/18 2205  AST 33  ALT 24  ALKPHOS 51  BILITOT 0.4  PROT 5.7*  ALBUMIN 3.5   No results for input(s): LIPASE, AMYLASE in the last 168 hours. No results for input(s): AMMONIA in the last 168 hours. Coagulation Profile: Recent Labs  Lab 09/10/18 2205  INR 1.1   Cardiac Enzymes: No results for input(s): CKTOTAL, CKMB, CKMBINDEX, TROPONINI in the last 168 hours. BNP (last 3 results) No results for  input(s): PROBNP in the last 8760 hours. HbA1C: Recent Labs    09/11/18 0413  HGBA1C 5.4   CBG: Recent Labs  Lab 09/10/18 2203  GLUCAP 125*   Lipid Profile: Recent Labs    09/11/18 0413  CHOL 143  HDL 64  LDLCALC 69  TRIG 51  CHOLHDL 2.2   Thyroid Function Tests: No results for input(s): TSH, T4TOTAL, FREET4, T3FREE, THYROIDAB in the last 72 hours. Anemia Panel: No results for input(s): VITAMINB12, FOLATE, FERRITIN, TIBC, IRON, RETICCTPCT in the last 72 hours.    Radiology Studies: I have reviewed all of the imaging during this hospital visit personally     Scheduled Meds: . ALPRAZolam  0.25 mg Oral Daily  . ALPRAZolam  0.5 mg Oral QHS  . apixaban  2.5 mg Oral BID  . cholecalciferol  1,000 Units Oral Daily  . hydrALAZINE  5 mg Intravenous Once  . pantoprazole  20 mg Oral BID  . simvastatin  10 mg Oral QHS   Continuous Infusions: . cefTRIAXone (ROCEPHIN)  IV Stopped (09/11/18 2112)     LOS: 1 day        Chinmay Squier Gerome Apley, MD

## 2018-09-12 NOTE — TOC Initial Note (Signed)
Transition of Care [facility]) - Initial/Assessment Note    Patient Details  Name: [name] MRN: [id number] Date of Birth: 08/20/1929  [facility] CM/SW Contact:    Pollie Friar, RN Phone Number: 09/12/2018, 4:27 PM  Clinical Narrative:                 PT/OT recommending SNF. After speaking to pt at the bedside, son and daughter over the phone the decision has been made to fax the patient out for SNF. Pt lives with son but he is not able to provide the needed assistance pt will require at home.  Son and daughter agreeable to [facility] and [facility]. FL2 completed and pt information sent to the facilities. TOC following.  Expected Discharge Plan: Skilled Nursing Facility Barriers to Discharge: Continued Medical Work up   Patient Goals and CMS Choice   CMS Medicare.gov Compare Post Acute Care list provided to:: Patient Represenative (must comment)(daughter and son) Choice offered to / list presented to : Adult Children, Patient  Expected Discharge Plan and Services Expected Discharge Plan: [place] In-house Referral: Clinical Social Work Discharge Planning Services: CM Consult Post Acute Care Choice: [place] arrangements for the past 2 months: [place]                                      Prior Living Arrangements/Services Living arrangements for the past 2 months: Single Family Home Lives with:: Adult Children(living with son) Patient language and need for interpreter reviewed:: Yes(no needs) Do you feel safe going back to the place where you live?: Yes      Need for Family Participation in Patient Care: Yes (Comment)(pt needs 24 hour supervision and assistance.) Care giver support system in place?: No (comment)(son is not able to provide needed assistance at home) Current home services: Home RN([facility] for IV antibiotics) Criminal Activity/Legal Involvement Pertinent to Current  Situation/Hospitalization: No - Comment as needed  Activities of Daily Living      Permission Sought/Granted                  Emotional Assessment Appearance:: Appears stated age Attitude/Demeanor/Rapport: Engaged Affect (typically observed): Accepting, Pleasant Orientation: : Oriented to Self, Oriented to Place, Oriented to  Time, Oriented to Situation(slurred speech)   Psych Involvement: No (comment)  Admission diagnosis:  Cerebrovascular accident (CVA) due to embolism of left middle cerebral artery ([place]) [I63.412] CVA (cerebral vascular accident) [facility]) [I63.9] Patient Active Problem List   Diagnosis Date Noted  . CVA (cerebral vascular accident) ([place]) 09/11/2018  . Thrombus of atrial appendage 09/11/2018  . E. coli UTI 09/11/2018  . Bacteremia 09/01/2018  . Tachycardia-bradycardia syndrome ([place])   . Atrial fibrillation with slow ventricular response ([place])   . Pacemaker battery depletion 08/16/2018  . Fall 02/11/2018  . GERD (gastroesophageal reflux disease) 02/11/2018  . Anxiety 02/11/2018  . CKD (chronic kidney disease), stage III ([place]) 02/11/2018  . UTI (urinary tract infection) 02/11/2018  . Need for prophylactic vaccination and inoculation against influenza 11/19/2015  . Acute encephalopathy 01/31/2015  . Pneumonia, community acquired 01/28/2015  . PNA (pneumonia) 01/28/2015  . Pacemaker 05/06/2014  . Bilateral leg edema 08/20/2013  . S/P mitral valve replacement with bioprosthetic valve - 29 mm Biocor 04/22/2013  . Permanent atrial fibrillation 04/22/2013  . Essential hypertension 04/22/2013  . Hyperlipidemia 04/22/2013  . Hypercalcemia 11/28/2012  .  Ureteral stone 08/02/2012  . Acute renal failure ([place]) 08/02/2012  . Thrombocytopenia ([place]) 01/20/2011  . Chronic diastolic congestive heart failure ([place]) 04/04/2007  . BRONCHOPNEUMONIA ORGANISM UNSPECIFIED 03/13/2007   PCP:  Lawerance Cruel, MD Pharmacy:   [id number] [address], [place] [address] Phone: [phone] Fax: [phone]     Social Determinants of Health (SDOH) Interventions    Readmission Risk Interventions No flowsheet data found.

## 2018-09-12 NOTE — Progress Notes (Signed)
Pt has gone to radiology for repeat CT

## 2018-09-12 NOTE — Progress Notes (Signed)
STROKE TEAM PROGRESS NOTE   INTERVAL HISTORY Pt sitting in bed for lunch. No complains. CT repeat showed possible small left MCA cortical infarct. Will increase eliquis to 5mg  bid.   Vitals:   09/12/18 0140 09/12/18 0212 09/12/18 0341 09/12/18 0733  BP: (!) 132/112 (!) 174/135 (!) 112/52 112/65  Pulse: 69 72  85  Resp: 16 20  20   Temp:   98.7 F (37.1 C) 98.2 F (36.8 C)  TempSrc:   Oral Oral  SpO2: 100% 100%  99%  Weight:      Height:        CBC:  Recent Labs  Lab 09/10/18 2205 09/10/18 2207 09/11/18 0413  WBC 6.2  --  5.1  NEUTROABS 3.6  --   --   HGB 11.9* 12.9 11.0*  HCT 38.2 38.0 34.7*  MCV 99.5  --  97.2  PLT 111*  --  100*    Basic Metabolic Panel:  Recent Labs  Lab 09/11/18 0413 09/12/18 1104  NA 139 140  K 4.8 4.4  CL 104 107  CO2 29 27  GLUCOSE 115* 127*  BUN 22 17  CREATININE 1.27* 1.01*  CALCIUM 10.3 10.3   Lipid Panel:     Component Value Date/Time   CHOL 143 09/11/2018 0413   TRIG 51 09/11/2018 0413   HDL 64 09/11/2018 0413   CHOLHDL 2.2 09/11/2018 0413   VLDL 10 09/11/2018 0413   LDLCALC 69 09/11/2018 0413   HgbA1c:  Lab Results  Component Value Date   HGBA1C 5.4 09/11/2018   Urine Drug Screen:     Component Value Date/Time   LABOPIA NONE DETECTED 09/10/2018 2308   COCAINSCRNUR NONE DETECTED 09/10/2018 2308   LABBENZ POSITIVE (A) 09/10/2018 2308   AMPHETMU NONE DETECTED 09/10/2018 2308   THCU NONE DETECTED 09/10/2018 2308   LABBARB NONE DETECTED 09/10/2018 2308    Alcohol Level     Component Value Date/Time   ETH <10 09/10/2018 2205    IMAGING Ct Angio Head W Or Wo Contrast  Result Date: 09/10/2018 CLINICAL DATA:  Code stroke. Initial evaluation for acute stroke, garbled speech. EXAM: CT ANGIOGRAPHY HEAD AND NECK TECHNIQUE: Multidetector CT imaging of the head and neck was performed using the standard protocol during bolus administration of intravenous contrast. Multiplanar CT image reconstructions and MIPs were obtained  to evaluate the vascular anatomy. Carotid stenosis measurements (when applicable) are obtained utilizing NASCET criteria, using the distal internal carotid diameter as the denominator. CONTRAST:  22mL OMNIPAQUE IOHEXOL 350 MG/ML SOLN COMPARISON:  Prior CT from 02/11/2018. FINDINGS: CT HEAD FINDINGS Brain: Diffuse prominence of the CSF containing spaces compatible generalized age-related cerebral atrophy. Mild chronic microvascular ischemic disease. No acute intracranial hemorrhage. No acute large vessel territory infarct. No mass lesion, midline shift or mass effect. No hydrocephalus. No extra-axial fluid collection. Vascular: No hyperdense vessel. Scattered vascular calcifications noted within the carotid siphons. Skull: Scalp soft tissues and calvarium within normal limits. Sinuses/Orbits: Globes orbital soft tissues demonstrate no acute finding. Paranasal sinuses are clear. No mastoid effusion. Other: None. ASPECTS ([place] Stroke Program Early CT Score) - Ganglionic level infarction (caudate, lentiform nuclei, internal capsule, insula, M1-M3 cortex): 7 - Supraganglionic infarction (M4-M6 cortex): 3 Total score (0-10 with 10 being normal): 10 Review of the MIP images confirms the above findings CTA NECK FINDINGS Aortic arch: Visualized aortic arch of normal caliber with normal 3 vessel morphology. Mild atheromatous plaque within the aortic arch. No hemodynamically significant stenosis seen about the origin of the great  vessels. Visualized subclavian arteries widely patent. Right carotid system: Right CCA tortuous proximally but widely patent to the bifurcation without stenosis. No significant atheromatous narrowing about the right bifurcation. Right ICA tortuous but widely patent to the skull base without stenosis, dissection, or occlusion. Left carotid system: Left CCA tortuous proximally but widely patent to the bifurcation without stenosis. No significant atheromatous narrowing about the left bifurcation. Left  ICA tortuous but widely patent to the skull base without stenosis, dissection or occlusion. Vertebral arteries: Both vertebral arteries arise from the subclavian arteries. Vertebral arteries tortuous proximally but widely patent to the skull base without stenosis, dissection or occlusion. Skeleton: No acute osseous finding. No discrete lytic or blastic osseous lesions. Other neck: No other acute soft tissue abnormality within the neck. Subcentimeter hypodense left thyroid nodule noted, of doubtful significance. Torus palatini and torus mandibularis noted. Upper chest: Visualized upper chest demonstrates no acute finding. Left-sided pacemaker/AICD noted. Median sternotomy wires noted as well. Review of the MIP images confirms the above findings CTA HEAD FINDINGS Anterior circulation: Petrous segments widely patent. Mild atheromatous plaque within the cavernous/supraclinoid ICAs without hemodynamically significant stenosis. A1 segments widely patent. Normal anterior communicating artery. Anterior cerebral arteries widely patent to their distal aspects without stenosis. Right M1 widely patent. Normal right MCA bifurcation. Distal right MCA branches well perfused. Left M1 widely patent. Normal left MCA bifurcation. There is a focal abrupt occlusion of a distal left M3 branch, superior division (series 8, image 94). Irregular attenuated flow seen distally. Distal left MCA branches otherwise well perfused to their distal aspects. Posterior circulation: Both vertebral arteries widely patent to the vertebrobasilar junction. Posteroinferior cerebral arteries patent bilaterally. Basilar widely patent to its distal aspect without stenosis. Superior cerebral arteries perfuse bilaterally. Both of the posterior cerebral arteries primarily supplied via the basilar and are well perfused to their distal aspects. Venous sinuses: Patent. Anatomic variants: None significant. Review of the MIP images confirms the above findings  IMPRESSION: CT HEAD IMPRESSION: 1. No acute intracranial abnormality identified. 2. Aspects equals 10/10. 3. Age-related cerebral atrophy with mild chronic microvascular ischemic disease. CTA HEAD AND NECK IMPRESSION: 1. Positive CTA with abrupt occlusion of a distal left M3 branch, superior division. Finding likely embolic in nature. Irregular attenuated flow seen beyond the level of occlusion. 2. Mild atheromatous change for age elsewhere about the aortic arch and carotid siphons without hemodynamically significant stenosis. 3. Diffuse tortuosity about the proximal major arterial vasculature of the neck, suggesting chronic underlying hypertension. These results were communicated to Dr. Rory Percy at 10:41 pmon 7/27/2020by text page via the [facility] messaging system. Electronically Signed   By: Jeannine Boga M.D.   On: 09/10/2018 22:59   Ct Head Wo Contrast  Result Date: 09/12/2018 CLINICAL DATA:  85 year old female status post code stroke presentation on 09/10/2018. Distal left M3 branch occlusion in the superior division. EXAM: CT HEAD WITHOUT CONTRAST TECHNIQUE: Contiguous axial images were obtained from the base of the skull through the vertex without intravenous contrast. COMPARISON:  09/10/2018 and earlier. FINDINGS: Brain: No acute intracranial hemorrhage identified. No midline shift, mass effect, or evidence of intracranial mass lesion. There is a small new cortical area of hypodensity in the left middle frontal gyrus seen on series 3, image 20 and coronal image 42. No associated hemorrhage or mass effect. Elsewhere left MCA territory gray-white matter differentiation is stable. Stable gray-white matter differentiation elsewhere. No ventriculomegaly. Vascular: Calcified atherosclerosis at the skull base. Stable vascular density. Skull: No acute osseous abnormality identified. Sinuses/Orbits: Visualized  paranasal sinuses and mastoids are stable and well pneumatized. Other: Stable orbit and scalp soft  tissues. IMPRESSION: 1. Small new cortical infarct in the left middle frontal gyrus. See coronal image 42. No associated hemorrhage or mass effect. 2. Otherwise stable non contrast CT appearance of the brain. Electronically Signed   By: Genevie Ann M.D.   On: 09/12/2018 08:21   Ct Angio Neck W Or Wo Contrast  Result Date: 09/10/2018 CLINICAL DATA:  Code stroke. Initial evaluation for acute stroke, garbled speech. EXAM: CT ANGIOGRAPHY HEAD AND NECK TECHNIQUE: Multidetector CT imaging of the head and neck was performed using the standard protocol during bolus administration of intravenous contrast. Multiplanar CT image reconstructions and MIPs were obtained to evaluate the vascular anatomy. Carotid stenosis measurements (when applicable) are obtained utilizing NASCET criteria, using the distal internal carotid diameter as the denominator. CONTRAST:  28mL OMNIPAQUE IOHEXOL 350 MG/ML SOLN COMPARISON:  Prior CT from 02/11/2018. FINDINGS: CT HEAD FINDINGS Brain: Diffuse prominence of the CSF containing spaces compatible generalized age-related cerebral atrophy. Mild chronic microvascular ischemic disease. No acute intracranial hemorrhage. No acute large vessel territory infarct. No mass lesion, midline shift or mass effect. No hydrocephalus. No extra-axial fluid collection. Vascular: No hyperdense vessel. Scattered vascular calcifications noted within the carotid siphons. Skull: Scalp soft tissues and calvarium within normal limits. Sinuses/Orbits: Globes orbital soft tissues demonstrate no acute finding. Paranasal sinuses are clear. No mastoid effusion. Other: None. ASPECTS ([place] Stroke Program Early CT Score) - Ganglionic level infarction (caudate, lentiform nuclei, internal capsule, insula, M1-M3 cortex): 7 - Supraganglionic infarction (M4-M6 cortex): 3 Total score (0-10 with 10 being normal): 10 Review of the MIP images confirms the above findings CTA NECK FINDINGS Aortic arch: Visualized aortic arch of normal  caliber with normal 3 vessel morphology. Mild atheromatous plaque within the aortic arch. No hemodynamically significant stenosis seen about the origin of the great vessels. Visualized subclavian arteries widely patent. Right carotid system: Right CCA tortuous proximally but widely patent to the bifurcation without stenosis. No significant atheromatous narrowing about the right bifurcation. Right ICA tortuous but widely patent to the skull base without stenosis, dissection, or occlusion. Left carotid system: Left CCA tortuous proximally but widely patent to the bifurcation without stenosis. No significant atheromatous narrowing about the left bifurcation. Left ICA tortuous but widely patent to the skull base without stenosis, dissection or occlusion. Vertebral arteries: Both vertebral arteries arise from the subclavian arteries. Vertebral arteries tortuous proximally but widely patent to the skull base without stenosis, dissection or occlusion. Skeleton: No acute osseous finding. No discrete lytic or blastic osseous lesions. Other neck: No other acute soft tissue abnormality within the neck. Subcentimeter hypodense left thyroid nodule noted, of doubtful significance. Torus palatini and torus mandibularis noted. Upper chest: Visualized upper chest demonstrates no acute finding. Left-sided pacemaker/AICD noted. Median sternotomy wires noted as well. Review of the MIP images confirms the above findings CTA HEAD FINDINGS Anterior circulation: Petrous segments widely patent. Mild atheromatous plaque within the cavernous/supraclinoid ICAs without hemodynamically significant stenosis. A1 segments widely patent. Normal anterior communicating artery. Anterior cerebral arteries widely patent to their distal aspects without stenosis. Right M1 widely patent. Normal right MCA bifurcation. Distal right MCA branches well perfused. Left M1 widely patent. Normal left MCA bifurcation. There is a focal abrupt occlusion of a distal  left M3 branch, superior division (series 8, image 94). Irregular attenuated flow seen distally. Distal left MCA branches otherwise well perfused to their distal aspects. Posterior circulation: Both vertebral arteries widely patent  to the vertebrobasilar junction. Posteroinferior cerebral arteries patent bilaterally. Basilar widely patent to its distal aspect without stenosis. Superior cerebral arteries perfuse bilaterally. Both of the posterior cerebral arteries primarily supplied via the basilar and are well perfused to their distal aspects. Venous sinuses: Patent. Anatomic variants: None significant. Review of the MIP images confirms the above findings IMPRESSION: CT HEAD IMPRESSION: 1. No acute intracranial abnormality identified. 2. Aspects equals 10/10. 3. Age-related cerebral atrophy with mild chronic microvascular ischemic disease. CTA HEAD AND NECK IMPRESSION: 1. Positive CTA with abrupt occlusion of a distal left M3 branch, superior division. Finding likely embolic in nature. Irregular attenuated flow seen beyond the level of occlusion. 2. Mild atheromatous change for age elsewhere about the aortic arch and carotid siphons without hemodynamically significant stenosis. 3. Diffuse tortuosity about the proximal major arterial vasculature of the neck, suggesting chronic underlying hypertension. These results were communicated to Dr. Rory Percy at 10:41 pmon 7/27/2020by text page via the [facility] messaging system. Electronically Signed   By: Jeannine Boga M.D.   On: 09/10/2018 22:59   Ct Head Code Stroke Wo Contrast  Result Date: 09/10/2018 CLINICAL DATA:  Code stroke. Initial evaluation for acute stroke, garbled speech. EXAM: CT ANGIOGRAPHY HEAD AND NECK TECHNIQUE: Multidetector CT imaging of the head and neck was performed using the standard protocol during bolus administration of intravenous contrast. Multiplanar CT image reconstructions and MIPs were obtained to evaluate the vascular anatomy. Carotid  stenosis measurements (when applicable) are obtained utilizing NASCET criteria, using the distal internal carotid diameter as the denominator. CONTRAST:  46mL OMNIPAQUE IOHEXOL 350 MG/ML SOLN COMPARISON:  Prior CT from 02/11/2018. FINDINGS: CT HEAD FINDINGS Brain: Diffuse prominence of the CSF containing spaces compatible generalized age-related cerebral atrophy. Mild chronic microvascular ischemic disease. No acute intracranial hemorrhage. No acute large vessel territory infarct. No mass lesion, midline shift or mass effect. No hydrocephalus. No extra-axial fluid collection. Vascular: No hyperdense vessel. Scattered vascular calcifications noted within the carotid siphons. Skull: Scalp soft tissues and calvarium within normal limits. Sinuses/Orbits: Globes orbital soft tissues demonstrate no acute finding. Paranasal sinuses are clear. No mastoid effusion. Other: None. ASPECTS ([place] Stroke Program Early CT Score) - Ganglionic level infarction (caudate, lentiform nuclei, internal capsule, insula, M1-M3 cortex): 7 - Supraganglionic infarction (M4-M6 cortex): 3 Total score (0-10 with 10 being normal): 10 Review of the MIP images confirms the above findings CTA NECK FINDINGS Aortic arch: Visualized aortic arch of normal caliber with normal 3 vessel morphology. Mild atheromatous plaque within the aortic arch. No hemodynamically significant stenosis seen about the origin of the great vessels. Visualized subclavian arteries widely patent. Right carotid system: Right CCA tortuous proximally but widely patent to the bifurcation without stenosis. No significant atheromatous narrowing about the right bifurcation. Right ICA tortuous but widely patent to the skull base without stenosis, dissection, or occlusion. Left carotid system: Left CCA tortuous proximally but widely patent to the bifurcation without stenosis. No significant atheromatous narrowing about the left bifurcation. Left ICA tortuous but widely patent to the  skull base without stenosis, dissection or occlusion. Vertebral arteries: Both vertebral arteries arise from the subclavian arteries. Vertebral arteries tortuous proximally but widely patent to the skull base without stenosis, dissection or occlusion. Skeleton: No acute osseous finding. No discrete lytic or blastic osseous lesions. Other neck: No other acute soft tissue abnormality within the neck. Subcentimeter hypodense left thyroid nodule noted, of doubtful significance. Torus palatini and torus mandibularis noted. Upper chest: Visualized upper chest demonstrates no acute finding. Left-sided pacemaker/AICD noted.  Median sternotomy wires noted as well. Review of the MIP images confirms the above findings CTA HEAD FINDINGS Anterior circulation: Petrous segments widely patent. Mild atheromatous plaque within the cavernous/supraclinoid ICAs without hemodynamically significant stenosis. A1 segments widely patent. Normal anterior communicating artery. Anterior cerebral arteries widely patent to their distal aspects without stenosis. Right M1 widely patent. Normal right MCA bifurcation. Distal right MCA branches well perfused. Left M1 widely patent. Normal left MCA bifurcation. There is a focal abrupt occlusion of a distal left M3 branch, superior division (series 8, image 94). Irregular attenuated flow seen distally. Distal left MCA branches otherwise well perfused to their distal aspects. Posterior circulation: Both vertebral arteries widely patent to the vertebrobasilar junction. Posteroinferior cerebral arteries patent bilaterally. Basilar widely patent to its distal aspect without stenosis. Superior cerebral arteries perfuse bilaterally. Both of the posterior cerebral arteries primarily supplied via the basilar and are well perfused to their distal aspects. Venous sinuses: Patent. Anatomic variants: None significant. Review of the MIP images confirms the above findings IMPRESSION: CT HEAD IMPRESSION: 1. No acute  intracranial abnormality identified. 2. Aspects equals 10/10. 3. Age-related cerebral atrophy with mild chronic microvascular ischemic disease. CTA HEAD AND NECK IMPRESSION: 1. Positive CTA with abrupt occlusion of a distal left M3 branch, superior division. Finding likely embolic in nature. Irregular attenuated flow seen beyond the level of occlusion. 2. Mild atheromatous change for age elsewhere about the aortic arch and carotid siphons without hemodynamically significant stenosis. 3. Diffuse tortuosity about the proximal major arterial vasculature of the neck, suggesting chronic underlying hypertension. These results were communicated to Dr. Rory Percy at 10:41 pmon 7/27/2020by text page via the [facility] messaging system. Electronically Signed   By: Jeannine Boga M.D.   On: 09/10/2018 22:59    PHYSICAL EXAM Temp:  [97.6 F (36.4 C)-99.1 F (37.3 C)] 98.2 F (36.8 C) (07/29 0733) Pulse Rate:  [69-85] 85 (07/29 0733) Resp:  [16-20] 20 (07/29 0733) BP: (112-174)/(52-135) 112/65 (07/29 0733) SpO2:  [95 %-100 %] 99 % (07/29 0733)  General - frail elder lady, thin built, well developed, in no apparent distress.  Ophthalmologic - fundi not visualized due to noncooperation.  Cardiovascular - irregularly irregular heart rate and rhythm.  Mental Status -  Level of arousal and orientation to age, year, place, and person were intact, but not orientated to month. Language including expression, naming, repetition, comprehension was assessed and found intact, mild dysarthria.  Cranial Nerves II - XII - II - Visual field intact OU. III, IV, VI - Extraocular movements intact. V - Facial sensation intact bilaterally. VII - Facial movement intact bilaterally. VIII - Hearing & vestibular intact bilaterally. X - Palate elevates symmetrically, mild dysarthria, could be chronic at baseline. XI - Chin turning & shoulder shrug intact bilaterally. XII - Tongue protrusion intact.  Motor Strength - The  patient's strength was symmetrical in all extremities and pronator drift was absent. Deformity of b/l hand joints, L>R.  Bulk was decreased b/l hands and fasciculations were absent.   Motor Tone - Muscle tone was assessed at the neck and appendages and was normal.  Reflexes - The patient's reflexes were symmetrical in all extremities and she had no pathological reflexes.  Sensory - Light touch, temperature/pinprick were assessed and were symmetrical.    Coordination - The patient had normal movements in the hands with no ataxia or dysmetria.  Essential tremor present bilaterally, mild.  Gait and Station - deferred.   ASSESSMENT/PLAN [name] is a 85 y.o.  female with history of AF,  congestive heart failure, depression, hyperlipidemia, hypertension, hyperparathyroidism, essential tremor, arthritis, sick sinus syndrome status post pacemaker placement in 2009 and replacement of the pacemaker a week ago sent home with PICC with abx treatment for bacteremia and Eliquis 2.5 bid for a LA clot presenting with increasing frailty, tremors and with garbled speech.   Stroke: Small cortical L MCA infarct - likely related to LAA thrombus on underdosed [facility]  Code Stroke CT head No acute abnormality.   CTA head & neck distal L M3 branch occlusion superior division. Mild atherosclerosis ICA and aortic arch. Diffused tortuosity of neck.  Repeat CT head  new small cortical infarct L middle frontal gyrus  Recent TEE 7/22 large LA thrombus on the atrial septus. LA severely dilated. EF 60-65%. No endocarditis  LE dopplers 7/23 negative for DVT  LDL 69  HgbA1c 5.4  Eliquis for VTE prophylaxis  Eliquis (apixaban) daily 2.5 mg bid prior to admission, now on Eliquis (apixaban) daily 2.5 mg bid. Increase to therapeutic dose of 5 mg bid.  Therapy recommendations:  SNF   Disposition:  pending   Paroxsymal Atrial Fibrillation with LAA thrombus  Home anticoagulation: on  Eliquis (apixaban) daily   2.5 bid continued in the hospital  LAA thrombus found on TEE 7/22  Have increased eliquis to 5mg  bid, continue on discharge  Hypertension  Stable . Permissive hypertension (OK if < 180/105) but gradually normalize in 3-5 days . Long-term BP goal normotensive  Hyperlipidemia  Home meds:  ometa 3 and zocor 10, zocor 10 resumed in hospital  LDL 69, goal < 70  Continue statin at discharge  Thrombocytopenia   Followed with hematologist in [place]  PLT 96->111->100  Not on antiplatelet  OK to continue [facility] with eliquis  Other Stroke Risk Factors  Advanced age  Chronic diastolic congestive heart failure  SSS s/p pacer in 2009, replaced last week  Other Active Problems  Recent bacteremia - Streptococcus agalactiae bacteremia - PICC and Rocephin x 6 wks thru 09/29/2018. No endocarditis  CKD stage III Cr 1.27->1.01  E coli UTI  Anxiety   Anemia Hb 11  Hospital day # 1  Neurology will sign off. Please call with questions. Pt will follow up with stroke clinic NP at [facility] in about 4 weeks. Thanks for the consult.   Rosalin Hawking, MD PhD Stroke Neurology 09/12/2018 12:08 PM   To contact Stroke Continuity provider, please refer to [email or web address] After hours, contact General Neurology

## 2018-09-12 NOTE — NC FL2 (Signed)
[place] LEVEL OF CARE SCREENING TOOL     IDENTIFICATION  Patient Name: [name] Birthdate: January 22, 1930 Sex: female Admission Date (Current Location): 09/10/2018  [facility] and [place] Number:  Herbalist and Address:  [facility] [place], [place] [address] [place], [place] [address]      Provider Number: [id number]  Attending Physician Name and Address:  Tawni Millers  Relative Name and Phone Number:       Current Level of Care: Hospital Recommended Level of Care: [place] Prior Approval Number:    Date Approved/Denied:   PASRR Number: [id number]  Discharge Plan: SNF    Current Diagnoses: Patient Active Problem List   Diagnosis Date Noted  . CVA (cerebral vascular accident) ([place]) 09/11/2018  . Thrombus of atrial appendage 09/11/2018  . E. coli UTI 09/11/2018  . Bacteremia 09/01/2018  . Tachycardia-bradycardia syndrome ([place])   . Atrial fibrillation with slow ventricular response ([place])   . Pacemaker battery depletion 08/16/2018  . Fall 02/11/2018  . GERD (gastroesophageal reflux disease) 02/11/2018  . Anxiety 02/11/2018  . CKD (chronic kidney disease), stage III ([place]) 02/11/2018  . UTI (urinary tract infection) 02/11/2018  . Need for prophylactic vaccination and inoculation against influenza 11/19/2015  . Acute encephalopathy 01/31/2015  . Pneumonia, community acquired 01/28/2015  . PNA (pneumonia) 01/28/2015  . Pacemaker 05/06/2014  . Bilateral leg edema 08/20/2013  . S/P mitral valve replacement with bioprosthetic valve - 29 mm Biocor 04/22/2013  . Permanent atrial fibrillation 04/22/2013  . Essential hypertension 04/22/2013  . Hyperlipidemia 04/22/2013  . Hypercalcemia 11/28/2012  . Ureteral stone 08/02/2012  . Acute renal failure ([place]) 08/02/2012  . Thrombocytopenia ([place]) 01/20/2011  . Chronic diastolic congestive heart failure ([place]) 04/04/2007  . BRONCHOPNEUMONIA ORGANISM  UNSPECIFIED 03/13/2007    Orientation RESPIRATION BLADDER Height & Weight     Self, Time, Situation, Place  Normal Continent Weight: 72.2 kg Height:  5\' 3"  (160 cm)  BEHAVIORAL SYMPTOMS/MOOD NEUROLOGICAL BOWEL NUTRITION STATUS      Continent Diet(heart healthy with thins)  AMBULATORY STATUS COMMUNICATION OF NEEDS Skin   Extensive Assist Verbally Bruising(bruising to bilateral arms/ MASD to bilateral groins)                       Personal Care Assistance Level of Assistance  Bathing, Feeding, Dressing Bathing Assistance: Limited assistance Feeding assistance: Limited assistance Dressing Assistance: Maximum assistance     Functional Limitations Info  Sight, Hearing, Speech Sight Info: Impaired Hearing Info: Adequate Speech Info: Impaired    SPECIAL CARE FACTORS FREQUENCY  PT (By licensed PT), OT (By licensed OT), Speech therapy     PT Frequency: 5x/wk OT Frequency: 5x/wk     Speech Therapy Frequency: 5x/wk      Contractures Contractures Info: Not present    Additional Factors Info  Code Status, Allergies, Psychotropic Code Status Info: full Allergies Info: Zoloft Psychotropic Info: Xanax 0.25 mg daily and 0.5 mg at bedtime         Current Medications (09/12/2018):  This is the current hospital active medication list Current Facility-Administered Medications  Medication Dose Route Frequency Provider Last Rate Last Dose  . acetaminophen (TYLENOL) tablet 650 mg  650 mg Oral Q4H PRN Shela Leff, MD       Or  . acetaminophen (TYLENOL) solution 650 mg  650 mg Per Tube Q4H PRN Shela Leff, MD       Or  . acetaminophen (TYLENOL)  suppository 650 mg  650 mg Rectal Q4H PRN Shela Leff, MD      . ALPRAZolam Duanne Moron) tablet 0.25 mg  0.25 mg Oral Daily Shela Leff, MD   0.25 mg at 09/12/18 1026  . ALPRAZolam Duanne Moron) tablet 0.5 mg  0.5 mg Oral QHS Shela Leff, MD   0.5 mg at 09/11/18 2111  . apixaban (ELIQUIS) tablet 5 mg  5 mg Oral BID  Burnetta Sabin L, NP      . cefTRIAXone (ROCEPHIN) 2 g in sodium chloride 0.9 % 100 mL IVPB  2 g Intravenous Q24H Shela Leff, MD   Stopped at 09/11/18 2112  . cholecalciferol (VITAMIN D3) tablet 1,000 Units  1,000 Units Oral Daily Shela Leff, MD   1,000 Units at 09/12/18 1026  . hydrALAZINE (APRESOLINE) injection 5 mg  5 mg Intravenous Once Kirby-Graham, Karsten Fells, NP      . pantoprazole (PROTONIX) EC tablet 20 mg  20 mg Oral BID Shela Leff, MD   20 mg at 09/12/18 1026  . senna-docusate (Senokot-S) tablet 1 tablet  1 tablet Oral QHS PRN Shela Leff, MD      . simvastatin (ZOCOR) tablet 10 mg  10 mg Oral QHS Shela Leff, MD         Discharge Medications: Please see discharge summary for a list of discharge medications.  Relevant Imaging Results:  Relevant Lab Results:   Additional Information SS#: [id number]  // pt will be on Rocephin IV 2g daily until 8/15--pt has a PICC  Pollie Friar, RN

## 2018-09-13 DIAGNOSIS — I482 Chronic atrial fibrillation, unspecified: Secondary | ICD-10-CM

## 2018-09-13 MED ORDER — BISACODYL 10 MG RE SUPP
10.0000 mg | Freq: Once | RECTAL | Status: AC
  Administered 2018-09-13: 10 mg via RECTAL
  Filled 2018-09-13: qty 1

## 2018-09-13 NOTE — TOC Progression Note (Signed)
Transition of Care [facility]) - Progression Note    Patient Details  Name: [name] MRN: [id number] Date of Birth: 21-Dec-1929  Transition of Care [facility]) CM/SW Contact  Pollie Friar, RN Phone Number: 09/13/2018, 1:51 PM  Clinical Narrative:    Son and daughter selected [facility] for SNF rehab. [place] to being [employer]. TOC following.   Expected Discharge Plan: [place] Barriers to Discharge: Continued Medical Work up  Expected Discharge Plan and Services Expected Discharge Plan: [place] In-house Referral: Clinical Social Work Discharge Planning Services: CM Consult Post Acute Care Choice: [place] arrangements for the past 2 months: Single Family Home                                       Social Determinants of Health (SDOH) Interventions    Readmission Risk Interventions No flowsheet data found.

## 2018-09-13 NOTE — Progress Notes (Addendum)
Physical Therapy Treatment Patient Details Name: [name] MRN: [id number] DOB: 06-20-1929 Today's Date: 09/13/2018    History of Present Illness Patient is a 85 y/o female presenting to the ED on 09/10/2018 with primary complaints of garbled speech/strokelike symptoms. Past medical history significant of paroxysmal atrial fibrillation, sick sinus syndrome status post PPM, hypertension, hyperlipidemia, chronic diastolic congestive heart failure, CKD stage III, GERD, essential tremors, basal cell carcinoma of forehead. Head CT negative for acute finding. Admitted for continued work-up.    PT Comments    Patient seen for mobility progression. Pt is very pleasant and agreeable to participate in therapy. Pt requires min/mod A for OOB mobility and presents with some mild confusion at times. Pt has tendency to run into objects on R side when navigating environment and with increased instability with gait when turning/directional changes. Pt with RR into 30s and with SOB while mobilizing. Continue to progress as tolerated with anticipated d/c to SNF for further skilled PT services.      Follow Up Recommendations  SNF;Supervision/Assistance - 24 hour     Equipment Recommendations  None recommended by PT    Recommendations for Other Services       Precautions / Restrictions Precautions Precautions: Fall Precaution Comments: urine incontinence Restrictions Weight Bearing Restrictions: No    Mobility  Bed Mobility Overal bed mobility: Needs Assistance Bed Mobility: Supine to Sit     Supine to sit: Min guard     General bed mobility comments: min guard for safety  Transfers Overall transfer level: Needs assistance Equipment used: Rolling walker (2 wheeled) Transfers: Sit to/from [facility] Sit to Stand: Min assist Stand pivot transfers: Mod assist       General transfer comment: assist to power up and to steady; increased assist for stand pivot due to  unsafe use of AD and cues needed for sequencing and assist to guide hips to [facility] as pt attempting to sit prematurely  Ambulation/Gait Ambulation/Gait assistance: Mod assist Gait Distance (Feet): 150 Feet Assistive device: Rolling walker (2 wheeled) Gait Pattern/deviations: Step-through pattern;Decreased stride length;Trunk flexed;Drifts right/left Gait velocity: decreased   General Gait Details: assistance required for balance and for safe management of RW; pt with tendency to run into objects on R side despite cues and nearly constantly drifting toward  R side; increased instability with turning/directional changes    Stairs             Wheelchair Mobility    Modified Rankin (Stroke Patients Only)       Balance Overall balance assessment: Needs assistance Sitting-balance support: No upper extremity supported;Feet supported Sitting balance-Leahy Scale: Fair     Standing balance support: Bilateral upper extremity supported;During functional activity Standing balance-Leahy Scale: Poor                              Cognition Arousal/Alertness: Awake/alert Behavior During Therapy: WFL for tasks assessed/performed Overall Cognitive Status: No family/caregiver present to determine baseline cognitive functioning                                 General Comments: mild confusion at times       Exercises      General Comments        Pertinent Vitals/Pain Pain Assessment: No/denies pain    Home Living  Prior Function            PT Goals (current goals can now be found in the care plan section) Progress towards PT goals: Progressing toward goals    Frequency    Min 3X/week      PT Plan Current plan remains appropriate    Co-evaluation              AM-PAC PT "6 Clicks" Mobility   Outcome Measure  Help needed turning from your back to your side while in a flat bed without using bedrails?: A  Little Help needed moving from lying on your back to sitting on the side of a flat bed without using bedrails?: A Little Help needed moving to and from a bed to a chair (including a wheelchair)?: A Little Help needed standing up from a chair using your arms (e.g., wheelchair or bedside chair)?: A Little Help needed to walk in hospital room?: A Lot Help needed climbing 3-5 steps with a railing? : A Lot 6 Click Score: 16    End of Session Equipment Utilized During Treatment: Gait belt Activity Tolerance: Patient tolerated treatment well Patient left: in chair;with call bell/phone within reach;with chair alarm set Nurse Communication: Mobility status PT Visit Diagnosis: Unsteadiness on feet (R26.81);Muscle weakness (generalized) (M62.81);Other abnormalities of gait and mobility (R26.89)     Time: 0947-0962 PT Time Calculation (min) (ACUTE ONLY): 33 min  Charges:  $Gait Training: 23-37 mins                     Earney Navy, PTA Acute Rehabilitation Services Pager: [phone] Office: [phone]     Darliss Cheney 09/13/2018, 4:12 PM

## 2018-09-13 NOTE — Progress Notes (Signed)
PROGRESS NOTE    [name]  ENI:[id number] DOB: 10-29-1929 DOA: 09/10/2018 PCP: Lawerance Cruel, MD    Brief Narrative:  85 year old femalewhopresented with focal neurologic deficits. She does have significant past medical history for paroxysmal atrial fibrillation, sick sinus syndrome status post pacemaker, hypertension, dyslipidemia, chronic diastolic heart failure, chronic kidney disease stage III, GERD, essential tremors and basal cell carcinoma of the forehead. Patient presented to the hospital with slurred speech and difficulty recognizing common items. Acute onset of symptoms, resolved by the time EMS arrived. She had recurrent symptoms for about 10 minutes, which self resolved. Witnessed by EMS.  Patient is known to have a atrial thrombi per transesophageal echocardiography, taking apixaban 2.5 g twice daily.  On her initial physical examination blood pressure 106/72, heart rate 60, respiratory rate 20, temperature 97.6, oxygen saturation 96%, her lungs are clear to auscultation bilaterally, heart S1-S2 present with me, abdomen soft, no lower extremity edema, she had mild dysarthria apparently baseline per patient, her strength was preserved. Sodium 140, potassium 4.5, chloride 104, bicarb 26, glucose 132, BUN 27, creatinine 1.34, white count 6.2, hemoglobin 11.9, hematocrit 38.2, platelets 111.SARS COVID-19 negative.Urine analysis 11-20 white cells, 0-5 red cells. Head CT negative for acute changes, CT angiography with abrupt occlusion of the distal left M3 branch superior division. Likely embolic. EKG with baseline artifact, 119 bpm, left axis deviation, a sensed, V paced, positive PVC.  Patient was admitted to the hospital with a working diagnosis acute CVA   Assessment & Plan:   Principal Problem:   CVA (cerebral vascular accident) [facility]) Active Problems:   Essential hypertension   Bacteremia   Thrombus of atrial appendage   E. coli UTI   1. Acute  CVA/ small new cortical infarct in the left middle frontal gyrus. Patient tolerating well full dose apixaban. Continue with simvastatin. Will need SNF per physical therapy recommendations.   2. HTN.  Her blood pressure has remained controlled, 116/72 today, will continue to hold on antihypertensive agents.   3. Streptococcus agalactiae bacteremia. Antibiotic therapy with ceftriaxone until 09/29/18.  PICC line in place.   4. Paroxysmal atrial fibrillation. Patient has remained reate controlled, will dc telemetry monitoring for now, not on AV blockade. Patient is sp pacer, rhythm V paced. Anticoagulation with full dose apixaban.   5. Chronic diastolic heart failure. Blood pressure and volume status is stable will continue blood pressure monitoring.  No diuretic therapy indicated.   6. AKI. resolved with renal function normalized 07/29.Continue to encourage po intake.   7. Anxiety. Continue as needed alprazolam.   DVT prophylaxis:apixaban Code Status:full Family Communication:no family at the bedside Disposition Plan/ discharge barriers:waiting for SNF placement.  Body mass index is 28.2 kg/m. Malnutrition Type:      Malnutrition Characteristics:      Nutrition Interventions:     RN Pressure Injury Documentation:     Consultants:   Neurology   Procedures:     Antimicrobials:   Ceftriaxone IV     Subjective: Patient continue to improve speech, but continue to have significant weakness and deconditioning, agrees with snf at discharge.   Objective: Vitals:   09/13/18 0341 09/13/18 0342 09/13/18 0349 09/13/18 0700  BP: 120/66   (!) 122/57  Pulse: 78   77  Resp: 18  18 13   Temp: 98.7 F (37.1 C) 98.7 F (37.1 C)  (!) 97.4 F (36.3 C)  TempSrc: Oral Oral  Oral  SpO2:    97%  Weight:  Height:        Intake/Output Summary (Last 24 hours) at 09/13/2018 0802 Last data filed at 09/12/2018 2022 Gross per 24 hour  Intake 760 ml  Output -   Net 760 ml   Filed Weights   09/10/18 2200 09/10/18 2240  Weight: 72.2 kg 72.2 kg    Examination:   General: deconditioned  Neurology: Awake and alert, positive resting tremors, her speech is slow but no slurred.  E ENT: mild pallor, no icterus, oral mucosa moist Cardiovascular: No JVD. S1-S2 present, rhythmic, no gallops, rubs, or murmurs. No lower extremity edema. Pulmonary: positive breath sounds bilaterally, adequate air movement, no wheezing, rhonchi or rales. Gastrointestinal. Abdomen with no organomegaly, non tender, no rebound or guarding Skin. No rashes Musculoskeletal: no joint deformities     Data Reviewed: I have personally reviewed following labs and imaging studies  CBC: Recent Labs  Lab 09/10/18 2205 09/10/18 2207 09/11/18 0413  WBC 6.2  --  5.1  NEUTROABS 3.6  --   --   HGB 11.9* 12.9 11.0*  HCT 38.2 38.0 34.7*  MCV 99.5  --  97.2  PLT 111*  --  696*   Basic Metabolic Panel: Recent Labs  Lab 09/10/18 2205 09/10/18 2207 09/11/18 0413 09/12/18 1104  NA 140 139 139 140  K 4.5 4.4 4.8 4.4  CL 104 103 104 107  CO2 26  --  29 27  GLUCOSE 132* 126* 115* 127*  BUN 27* 29* 22 17  CREATININE 1.34* 1.30* 1.27* 1.01*  CALCIUM 10.6*  --  10.3 10.3   GFR: Estimated Creatinine Clearance: 37.4 mL/min (A) (by C-G formula based on SCr of 1.01 mg/dL (H)). Liver Function Tests: Recent Labs  Lab 09/10/18 2205  AST 33  ALT 24  ALKPHOS 51  BILITOT 0.4  PROT 5.7*  ALBUMIN 3.5   No results for input(s): LIPASE, AMYLASE in the last 168 hours. No results for input(s): AMMONIA in the last 168 hours. Coagulation Profile: Recent Labs  Lab 09/10/18 2205  INR 1.1   Cardiac Enzymes: No results for input(s): CKTOTAL, CKMB, CKMBINDEX, TROPONINI in the last 168 hours. BNP (last 3 results) No results for input(s): PROBNP in the last 8760 hours. HbA1C: Recent Labs    09/11/18 0413  HGBA1C 5.4   CBG: Recent Labs  Lab 09/10/18 2203  GLUCAP 125*    Lipid Profile: Recent Labs    09/11/18 0413  CHOL 143  HDL 64  LDLCALC 69  TRIG 51  CHOLHDL 2.2   Thyroid Function Tests: No results for input(s): TSH, T4TOTAL, FREET4, T3FREE, THYROIDAB in the last 72 hours. Anemia Panel: No results for input(s): VITAMINB12, FOLATE, FERRITIN, TIBC, IRON, RETICCTPCT in the last 72 hours.    Radiology Studies: I have reviewed all of the imaging during this hospital visit personally     Scheduled Meds: . ALPRAZolam  0.25 mg Oral Daily  . ALPRAZolam  0.5 mg Oral QHS  . apixaban  5 mg Oral BID  . cholecalciferol  1,000 Units Oral Daily  . hydrALAZINE  5 mg Intravenous Once  . pantoprazole  20 mg Oral BID  . simvastatin  10 mg Oral QHS   Continuous Infusions: . cefTRIAXone (ROCEPHIN)  IV 2 g (09/12/18 2022)     LOS: 2 days         Gerome Apley, MD

## 2018-09-14 MED ORDER — ALPRAZOLAM 0.5 MG PO TABS: 0.2500 mg | ORAL_TABLET | ORAL | 0 refills | Status: AC

## 2018-09-14 MED ORDER — APIXABAN 5 MG PO TABS: 5.0000 mg | ORAL_TABLET | Freq: Two times a day (BID) | ORAL | 0 refills | Status: DC

## 2018-09-14 NOTE — Progress Notes (Signed)
Pt D/C with [facility] via stretcher, LUE PICC kept in for SNF use wif IV antibiotics.    [name]

## 2018-09-14 NOTE — TOC Transition Note (Signed)
Transition of Care [facility]) - CM/SW Discharge Note   Patient Details  Name: [name] MRN: [id number] Date of Birth: 05-29-1929  Transition of Care [facility]) CM/SW Contact:  Pollie Friar, RN Phone Number: 09/14/2018, 3:37 PM   Clinical Narrative:    Pt discharging to [facility] SNF today. Pt to transport via PT[facility]. [facility] called and arranged. D/c packet at the desk. Bedside RN updated.    Report number: [id number] Pt going to room: 607   Final next level of care: [place] Barriers to Discharge: No Barriers Identified   Patient Goals and CMS Choice   CMS Medicare.gov Compare Post Acute Care list provided to:: Patient Represenative (must comment) Choice offered to / list presented to : Adult Children  Discharge Placement              Patient chooses bed at: [facility] Patient to be transferred to facility by: [facility] Name of family member notified: daughter: [name] Patient and family notified of of transfer: 09/14/18  Discharge Plan and Services In-house Referral: Clinical Social Work Discharge Planning Services: [employer] Consult Post Acute Care Choice: [place]                               Social Determinants of Health (SDOH) Interventions     Readmission Risk Interventions No flowsheet data found.

## 2018-09-14 NOTE — Progress Notes (Signed)
Physical Therapy Treatment Patient Details Name: [name] MRN: [id number] DOB: June 13, 1929 Today's Date: 09/14/2018    History of Present Illness Patient is a 85 y/o female presenting to the ED on 09/10/2018 with primary complaints of garbled speech/strokelike symptoms. Past medical history significant of paroxysmal atrial fibrillation, sick sinus syndrome status post PPM, hypertension, hyperlipidemia, chronic diastolic congestive heart failure, CKD stage III, GERD, essential tremors, basal cell carcinoma of forehead. Head CT negative for acute finding. Admitted for continued work-up.    PT Comments    Patient seen for mobility progression. Pt is very pleasant and agreeable to participate in therapy. Pt continues to present with impaired balance and gait deviations. Pt required mod A with RW for gait training and max directional cues to navigate environment due to running into objects on R side. Continue to progress as tolerated with anticipated d/c to SNF for further skilled PT services.     Follow Up Recommendations  SNF;Supervision/Assistance - 24 hour     Equipment Recommendations  None recommended by PT    Recommendations for Other Services       Precautions / Restrictions Precautions Precautions: Fall Precaution Comments: urine incontinence Restrictions Weight Bearing Restrictions: No    Mobility  Bed Mobility Overal bed mobility: Needs Assistance Bed Mobility: Supine to Sit     Supine to sit: Supervision     General bed mobility comments: supervision for safety  Transfers Overall transfer level: Needs assistance Equipment used: Rolling walker (2 wheeled) Transfers: Sit to/from Stand Sit to Stand: Min assist         General transfer comment: assist to power up and to steady  Ambulation/Gait Ambulation/Gait assistance: Mod assist Gait Distance (Feet): 140 Feet Assistive device: Rolling walker (2 wheeled) Gait Pattern/deviations: Step-through  pattern;Decreased stride length;Trunk flexed;Drifts right/left Gait velocity: decreased   General Gait Details: LOB X 1 with assist required to recover; assistance required for balance and guiding RW as pt runs into objects on R side; max directional cues to navigate environment   Stairs             Wheelchair Mobility    Modified Rankin (Stroke Patients Only)       Balance Overall balance assessment: Needs assistance Sitting-balance support: No upper extremity supported;Feet supported Sitting balance-Leahy Scale: Fair     Standing balance support: Bilateral upper extremity supported;During functional activity Standing balance-Leahy Scale: Poor                              Cognition Arousal/Alertness: Awake/alert Behavior During Therapy: WFL for tasks assessed/performed Overall Cognitive Status: No family/caregiver present to determine baseline cognitive functioning                                 General Comments: mild confusion at times       Exercises      General Comments        Pertinent Vitals/Pain Pain Assessment: No/denies pain    Home Living                      Prior Function            PT Goals (current goals can now be found in the care plan section) Progress towards PT goals: Progressing toward goals    Frequency    Min 3X/week  PT Plan Current plan remains appropriate    Co-evaluation              AM-PAC PT "6 Clicks" Mobility   Outcome Measure  Help needed turning from your back to your side while in a flat bed without using bedrails?: A Little Help needed moving from lying on your back to sitting on the side of a flat bed without using bedrails?: A Little Help needed moving to and from a bed to a chair (including a wheelchair)?: A Little Help needed standing up from a chair using your arms (e.g., wheelchair or bedside chair)?: A Little Help needed to walk in hospital room?: A  Lot Help needed climbing 3-5 steps with a railing? : A Lot 6 Click Score: 16    End of Session Equipment Utilized During Treatment: Gait belt Activity Tolerance: Patient tolerated treatment well Patient left: in chair;with call bell/phone within reach;with chair alarm set Nurse Communication: Mobility status PT Visit Diagnosis: Unsteadiness on feet (R26.81);Muscle weakness (generalized) (M62.81);Other abnormalities of gait and mobility (R26.89)     Time: 1530-1600 PT Time Calculation (min) (ACUTE ONLY): 30 min  Charges:  $Gait Training: 23-37 mins                     Earney Navy, PTA Acute Rehabilitation Services Pager: [phone] Office: [phone]     Darliss Cheney 09/14/2018, 4:11 PM

## 2018-09-14 NOTE — Progress Notes (Signed)
Report called, given to [facility].   [name]

## 2018-09-14 NOTE — Discharge Summary (Signed)
Physician Discharge Summary  [name] IPJ:[id number] DOB: 1929/09/08 DOA: 09/10/2018  PCP: Lawerance Cruel, MD  Admit date: 09/10/2018 Discharge date: 09/14/2018  Admitted From: Home  Disposition:  SNF  Recommendations for Outpatient Follow-up and new medication changes:  1. Follow up with Dr Harrington Challenger in 7 days.  2. Patient has been placed on full dose apixaban.   Home Health: no   Equipment/Devices: na    Discharge Condition: stable  CODE STATUS: full  Diet recommendation: heart healthy   Brief/Interim Summary: 85 year old femalewhopresented with focal neurologic deficits. She does have significant past medical history for paroxysmal atrial fibrillation, sick sinus syndrome status post pacemaker, hypertension, dyslipidemia, chronic diastolic heart failure, chronic kidney disease stage III, GERD, essential tremors and basal cell carcinoma of the forehead. Patient presented to the hospital with slurred speech and difficulty recognizing common items. Acute onset of symptoms, resolved by the time EMS arrived. She had recurrent symptoms for about 10 minutes, which self resolved, wtnessed by EMS.  Patient is known to have a atrial thrombi per transesophageal echocardiography, taking apixaban 2.5 g twice daily.  On her initial physical examination blood pressure 106/72, heart rate 60, respiratory rate 20, temperature 97.6, oxygen saturation 96%, her lungs were clear to auscultation bilaterally, heart S1-S2 present, irregular, abdomen soft, no lower extremity edema, she had mild dysarthria apparently baseline per patient, her strength was preserved. Sodium 140, potassium 4.5, chloride 104, bicarb 26, glucose 132, BUN 27, creatinine 1.34, white count 6.2, hemoglobin 11.9, hematocrit 38.2, platelets 111.SARS COVID-19 negative.Urine analysis 11-20 white cells, 0-5 red cells. Head CT negative for acute changes, CT angiography with abrupt occlusion of the distal left M3 branch  superior division. Likely embolic. EKG with baseline artifact, 119 bpm, left axis deviation, a sensed, V paced, positive PVC.  Patient was admitted to the hospital with a working diagnosis acute CVA  1.  Acute ischemic CVA, small new cortical infarct in the left middle frontal gyrus.  Patient was admitted to the medical ward, she was placed on a remote telemetry monitor, had frequent neuro checks, physical therapy, speech therapy and neurology consultation.  Apixaban was increased to full dose 5 mg twice daily, with good toleration.  She was unable to get a brain MRI due to her pacemaker, a follow-up head CT on July 29 showed the new cortical infarct.  A recent work-up with: transesophageal echocardiography July 22 showed a large left atrial thrombus in the atrial septus.  LDL 69, hemoglobin A1c 5.4, Doppler ultrasonography of the lower extremities negative for deep vein thrombosis.  Continue taking simvastatin.   2.  Hypertension.  Her blood pressure remained well controlled off antihypertensives. At discharge will resume amlodipine olmesartan combination.   3.  Streptococcus agalactiae bacteremia.  Patient does have a PICC line on the right upper extremity, she is plan to continue antibiotic therapy with IV ceftriaxone until September 29, 2018.  After completing the antibiotic therapy the PICC line should be removed.  4.  Paroxysmal atrial fibrillation.  Patient remained rate controlled, paced rhythm on telemetry monitor, patient will take a full anticoagulation for secondary stroke prophylaxis.  5.  Chronic diastolic heart failure.  No clinical signs of exacerbation, patient will continue taking furosemide as needed for volume overload.  6.  Acute kidney injury.  Patient received supportive medical therapy with normalization of renal function.  Discharge creatinine 1.0, potassium 4.4, serum bicarbonate 27.   7.  Anxiety.  Continue as needed alprazolam.  Discharge Diagnoses:  Principal  Problem:   CVA (cerebral vascular accident) [facility]) Active Problems:   Essential hypertension   Bacteremia   Thrombus of atrial appendage   E. coli UTI    Discharge Instructions  Discharge Instructions    Ambulatory referral to Neurology   Complete by: As directed    Follow up with stroke clinic NP (Jessica Vanschaick or Cecille Rubin, if both not available, consider Zachery Dauer, or Ahern) at [facility] in about 4 weeks. Thanks.     Allergies as of 09/14/2018      Reactions   Zoloft [sertraline Hcl]       Medication List    TAKE these medications   acetaminophen 500 MG tablet Commonly known as: TYLENOL Take 500-1,000 mg by mouth daily as needed for moderate pain.   ALPRAZolam 0.5 MG tablet Commonly known as: XANAX Take 0.5-1 tablets (0.25-0.5 mg total) by mouth See admin instructions. Take 1/2 tablet every morning and take 1 tablet at night   amLODipine-olmesartan 5-20 MG tablet Commonly known as: AZOR TAKE 1 TABLET BY MOUTH EVERY DAY   apixaban 5 MG Tabs tablet Commonly known as: ELIQUIS Take 1 tablet (5 mg total) by mouth 2 (two) times daily. What changed:   medication strength  how much to take   cefTRIAXone  IVPB Commonly known as: ROCEPHIN Inject 2 g into the vein daily for 23 days. Indication:  Group B strep bacteremia Last Day of Therapy:  09/29/2018 Labs - Once weekly:  CBC/D and BMP, Labs - Every other week:  ESR and CRP   cholecalciferol 25 MCG (1000 UT) tablet Commonly known as: VITAMIN D3 Take 1,000 Units by mouth daily.   furosemide 40 MG tablet Commonly known as: LASIX Take 1 tablet (40 mg total) by mouth daily as needed for fluid or edema. Reported on 01/28/2015   Klor-Con M20 20 MEQ tablet Generic drug: potassium chloride SA Take 1 tablet (20 mEq total) by mouth daily as needed (take with furosemide or lasix). What changed: when to take this   omeprazole 20 MG capsule Commonly known as: PRILOSEC Take 20 mg by mouth 2 (two) times a day.    OVER THE COUNTER MEDICATION Take 1 tablet by mouth 2 (two) times daily. Nutrifuron (immune supplement)   Probiotic Caps Take 1 capsule by mouth daily.   simvastatin 10 MG tablet Commonly known as: ZOCOR TAKE 1 TABLET BY MOUTH EVERY DAY AT DINNER What changed: See the new instructions.   VITAMIN B COMPLEX PO Take 1 tablet by mouth daily.   VITAMIN B PLUS+ PO Take 1 tablet by mouth daily.       Contact information for follow-up providers    [facility]. Schedule an appointment as soon as possible for a visit in 4 week(s).   Specialty: Neurology Contact information: [address] [place] [phone]           Contact information for after-discharge care    Destination    [facility] .   Service: Skilled Nursing Contact information: [address] [place] [phone]                 Allergies  Allergen Reactions  . Zoloft [Sertraline Hcl]     Consultations:  Neurology    Procedures/Studies: Ct Angio Head W Or Wo Contrast  Result Date: 09/10/2018 CLINICAL DATA:  Code stroke. Initial evaluation for acute stroke, garbled speech. EXAM: CT ANGIOGRAPHY HEAD AND NECK TECHNIQUE: Multidetector CT imaging  of the head and neck was performed using the standard protocol during bolus administration of intravenous contrast. Multiplanar CT image reconstructions and MIPs were obtained to evaluate the vascular anatomy. Carotid stenosis measurements (when applicable) are obtained utilizing NASCET criteria, using the distal internal carotid diameter as the denominator. CONTRAST:  65m OMNIPAQUE IOHEXOL 350 MG/ML SOLN COMPARISON:  Prior CT from 02/11/2018. FINDINGS: CT HEAD FINDINGS Brain: Diffuse prominence of the CSF containing spaces compatible generalized age-related cerebral atrophy. Mild chronic microvascular ischemic disease. No acute intracranial hemorrhage. No acute  large vessel territory infarct. No mass lesion, midline shift or mass effect. No hydrocephalus. No extra-axial fluid collection. Vascular: No hyperdense vessel. Scattered vascular calcifications noted within the carotid siphons. Skull: Scalp soft tissues and calvarium within normal limits. Sinuses/Orbits: Globes orbital soft tissues demonstrate no acute finding. Paranasal sinuses are clear. No mastoid effusion. Other: None. ASPECTS (ARomeStroke Program Early CT Score) - Ganglionic level infarction (caudate, lentiform nuclei, internal capsule, insula, M1-M3 cortex): 7 - Supraganglionic infarction (M4-M6 cortex): 3 Total score (0-10 with 10 being normal): 10 Review of the MIP images confirms the above findings CTA NECK FINDINGS Aortic arch: Visualized aortic arch of normal caliber with normal 3 vessel morphology. Mild atheromatous plaque within the aortic arch. No hemodynamically significant stenosis seen about the origin of the great vessels. Visualized subclavian arteries widely patent. Right carotid system: Right CCA tortuous proximally but widely patent to the bifurcation without stenosis. No significant atheromatous narrowing about the right bifurcation. Right ICA tortuous but widely patent to the skull base without stenosis, dissection, or occlusion. Left carotid system: Left CCA tortuous proximally but widely patent to the bifurcation without stenosis. No significant atheromatous narrowing about the left bifurcation. Left ICA tortuous but widely patent to the skull base without stenosis, dissection or occlusion. Vertebral arteries: Both vertebral arteries arise from the subclavian arteries. Vertebral arteries tortuous proximally but widely patent to the skull base without stenosis, dissection or occlusion. Skeleton: No acute osseous finding. No discrete lytic or blastic osseous lesions. Other neck: No other acute soft tissue abnormality within the neck. Subcentimeter hypodense left thyroid nodule noted, of  doubtful significance. Torus palatini and torus mandibularis noted. Upper chest: Visualized upper chest demonstrates no acute finding. Left-sided pacemaker/AICD noted. Median sternotomy wires noted as well. Review of the MIP images confirms the above findings CTA HEAD FINDINGS Anterior circulation: Petrous segments widely patent. Mild atheromatous plaque within the cavernous/supraclinoid ICAs without hemodynamically significant stenosis. A1 segments widely patent. Normal anterior communicating artery. Anterior cerebral arteries widely patent to their distal aspects without stenosis. Right M1 widely patent. Normal right MCA bifurcation. Distal right MCA branches well perfused. Left M1 widely patent. Normal left MCA bifurcation. There is a focal abrupt occlusion of a distal left M3 branch, superior division (series 8, image 94). Irregular attenuated flow seen distally. Distal left MCA branches otherwise well perfused to their distal aspects. Posterior circulation: Both vertebral arteries widely patent to the vertebrobasilar junction. Posteroinferior cerebral arteries patent bilaterally. Basilar widely patent to its distal aspect without stenosis. Superior cerebral arteries perfuse bilaterally. Both of the posterior cerebral arteries primarily supplied via the basilar and are well perfused to their distal aspects. Venous sinuses: Patent. Anatomic variants: None significant. Review of the MIP images confirms the above findings IMPRESSION: CT HEAD IMPRESSION: 1. No acute intracranial abnormality identified. 2. Aspects equals 10/10. 3. Age-related cerebral atrophy with mild chronic microvascular ischemic disease. CTA HEAD AND NECK IMPRESSION: 1. Positive CTA with abrupt occlusion of a distal left M3  branch, superior division. Finding likely embolic in nature. Irregular attenuated flow seen beyond the level of occlusion. 2. Mild atheromatous change for age elsewhere about the aortic arch and carotid siphons without  hemodynamically significant stenosis. 3. Diffuse tortuosity about the proximal major arterial vasculature of the neck, suggesting chronic underlying hypertension. These results were communicated to Dr. Rory Percy at 10:41 pmon 7/27/2020by text page via the [facility] messaging system. Electronically Signed   By: Jeannine Boga M.D.   On: 09/10/2018 22:59   Ct Head Wo Contrast  Result Date: 09/12/2018 CLINICAL DATA:  85 year old female status post code stroke presentation on 09/10/2018. Distal left M3 branch occlusion in the superior division. EXAM: CT HEAD WITHOUT CONTRAST TECHNIQUE: Contiguous axial images were obtained from the base of the skull through the vertex without intravenous contrast. COMPARISON:  09/10/2018 and earlier. FINDINGS: Brain: No acute intracranial hemorrhage identified. No midline shift, mass effect, or evidence of intracranial mass lesion. There is a small new cortical area of hypodensity in the left middle frontal gyrus seen on series 3, image 20 and coronal image 42. No associated hemorrhage or mass effect. Elsewhere left MCA territory gray-white matter differentiation is stable. Stable gray-white matter differentiation elsewhere. No ventriculomegaly. Vascular: Calcified atherosclerosis at the skull base. Stable vascular density. Skull: No acute osseous abnormality identified. Sinuses/Orbits: Visualized paranasal sinuses and mastoids are stable and well pneumatized. Other: Stable orbit and scalp soft tissues. IMPRESSION: 1. Small new cortical infarct in the left middle frontal gyrus. See coronal image 42. No associated hemorrhage or mass effect. 2. Otherwise stable non contrast CT appearance of the brain. Electronically Signed   By: Genevie Ann M.D.   On: 09/12/2018 08:21   Ct Angio Neck W Or Wo Contrast  Result Date: 09/10/2018 CLINICAL DATA:  Code stroke. Initial evaluation for acute stroke, garbled speech. EXAM: CT ANGIOGRAPHY HEAD AND NECK TECHNIQUE: Multidetector CT imaging of the head  and neck was performed using the standard protocol during bolus administration of intravenous contrast. Multiplanar CT image reconstructions and MIPs were obtained to evaluate the vascular anatomy. Carotid stenosis measurements (when applicable) are obtained utilizing NASCET criteria, using the distal internal carotid diameter as the denominator. CONTRAST:  43m OMNIPAQUE IOHEXOL 350 MG/ML SOLN COMPARISON:  Prior CT from 02/11/2018. FINDINGS: CT HEAD FINDINGS Brain: Diffuse prominence of the CSF containing spaces compatible generalized age-related cerebral atrophy. Mild chronic microvascular ischemic disease. No acute intracranial hemorrhage. No acute large vessel territory infarct. No mass lesion, midline shift or mass effect. No hydrocephalus. No extra-axial fluid collection. Vascular: No hyperdense vessel. Scattered vascular calcifications noted within the carotid siphons. Skull: Scalp soft tissues and calvarium within normal limits. Sinuses/Orbits: Globes orbital soft tissues demonstrate no acute finding. Paranasal sinuses are clear. No mastoid effusion. Other: None. ASPECTS (ASulligentStroke Program Early CT Score) - Ganglionic level infarction (caudate, lentiform nuclei, internal capsule, insula, M1-M3 cortex): 7 - Supraganglionic infarction (M4-M6 cortex): 3 Total score (0-10 with 10 being normal): 10 Review of the MIP images confirms the above findings CTA NECK FINDINGS Aortic arch: Visualized aortic arch of normal caliber with normal 3 vessel morphology. Mild atheromatous plaque within the aortic arch. No hemodynamically significant stenosis seen about the origin of the great vessels. Visualized subclavian arteries widely patent. Right carotid system: Right CCA tortuous proximally but widely patent to the bifurcation without stenosis. No significant atheromatous narrowing about the right bifurcation. Right ICA tortuous but widely patent to the skull base without stenosis, dissection, or occlusion. Left  carotid system: Left CCA tortuous  proximally but widely patent to the bifurcation without stenosis. No significant atheromatous narrowing about the left bifurcation. Left ICA tortuous but widely patent to the skull base without stenosis, dissection or occlusion. Vertebral arteries: Both vertebral arteries arise from the subclavian arteries. Vertebral arteries tortuous proximally but widely patent to the skull base without stenosis, dissection or occlusion. Skeleton: No acute osseous finding. No discrete lytic or blastic osseous lesions. Other neck: No other acute soft tissue abnormality within the neck. Subcentimeter hypodense left thyroid nodule noted, of doubtful significance. Torus palatini and torus mandibularis noted. Upper chest: Visualized upper chest demonstrates no acute finding. Left-sided pacemaker/AICD noted. Median sternotomy wires noted as well. Review of the MIP images confirms the above findings CTA HEAD FINDINGS Anterior circulation: Petrous segments widely patent. Mild atheromatous plaque within the cavernous/supraclinoid ICAs without hemodynamically significant stenosis. A1 segments widely patent. Normal anterior communicating artery. Anterior cerebral arteries widely patent to their distal aspects without stenosis. Right M1 widely patent. Normal right MCA bifurcation. Distal right MCA branches well perfused. Left M1 widely patent. Normal left MCA bifurcation. There is a focal abrupt occlusion of a distal left M3 branch, superior division (series 8, image 94). Irregular attenuated flow seen distally. Distal left MCA branches otherwise well perfused to their distal aspects. Posterior circulation: Both vertebral arteries widely patent to the vertebrobasilar junction. Posteroinferior cerebral arteries patent bilaterally. Basilar widely patent to its distal aspect without stenosis. Superior cerebral arteries perfuse bilaterally. Both of the posterior cerebral arteries primarily supplied via the  basilar and are well perfused to their distal aspects. Venous sinuses: Patent. Anatomic variants: None significant. Review of the MIP images confirms the above findings IMPRESSION: CT HEAD IMPRESSION: 1. No acute intracranial abnormality identified. 2. Aspects equals 10/10. 3. Age-related cerebral atrophy with mild chronic microvascular ischemic disease. CTA HEAD AND NECK IMPRESSION: 1. Positive CTA with abrupt occlusion of a distal left M3 branch, superior division. Finding likely embolic in nature. Irregular attenuated flow seen beyond the level of occlusion. 2. Mild atheromatous change for age elsewhere about the aortic arch and carotid siphons without hemodynamically significant stenosis. 3. Diffuse tortuosity about the proximal major arterial vasculature of the neck, suggesting chronic underlying hypertension. These results were communicated to Dr. Rory Percy at 10:41 pmon 7/27/2020by text page via the [facility] messaging system. Electronically Signed   By: Jeannine Boga M.D.   On: 09/10/2018 22:59   Dg Chest Port 1 View  Result Date: 09/06/2018 CLINICAL DATA:  Status post right PICC placement today. EXAM: PORTABLE CHEST 1 VIEW COMPARISON:  Single-view of the chest 08/31/2018. FINDINGS: New right PICC is in place with its tip projecting in the mid superior vena cava. Pacing device is noted. There is cardiomegaly. Left basilar atelectasis is seen. Right lung is clear. Aortic atherosclerosis noted. The patient is rotated on the study. IMPRESSION: New right PICC tip projects in the mid superior vena cava. Subsegmental atelectasis left lung base. Electronically Signed   By: Inge Rise M.D.   On: 09/06/2018 15:10   Dg Chest Portable 1 View  Result Date: 08/31/2018 CLINICAL DATA:  Weakness, confusion EXAM: PORTABLE CHEST 1 VIEW COMPARISON:  03/20/2018 FINDINGS: Left pacer remains in place, unchanged. Cardiomegaly. Lungs clear. No effusions. No acute bony abnormality. IMPRESSION: Cardiomegaly.  No active  disease. Electronically Signed   By: Rolm Baptise M.D.   On: 08/31/2018 20:56   Ct Head Code Stroke Wo Contrast  Result Date: 09/10/2018 CLINICAL DATA:  Code stroke. Initial evaluation for acute stroke, garbled speech. EXAM: CT  ANGIOGRAPHY HEAD AND NECK TECHNIQUE: Multidetector CT imaging of the head and neck was performed using the standard protocol during bolus administration of intravenous contrast. Multiplanar CT image reconstructions and MIPs were obtained to evaluate the vascular anatomy. Carotid stenosis measurements (when applicable) are obtained utilizing NASCET criteria, using the distal internal carotid diameter as the denominator. CONTRAST:  62m OMNIPAQUE IOHEXOL 350 MG/ML SOLN COMPARISON:  Prior CT from 02/11/2018. FINDINGS: CT HEAD FINDINGS Brain: Diffuse prominence of the CSF containing spaces compatible generalized age-related cerebral atrophy. Mild chronic microvascular ischemic disease. No acute intracranial hemorrhage. No acute large vessel territory infarct. No mass lesion, midline shift or mass effect. No hydrocephalus. No extra-axial fluid collection. Vascular: No hyperdense vessel. Scattered vascular calcifications noted within the carotid siphons. Skull: Scalp soft tissues and calvarium within normal limits. Sinuses/Orbits: Globes orbital soft tissues demonstrate no acute finding. Paranasal sinuses are clear. No mastoid effusion. Other: None. ASPECTS (ABlaineStroke Program Early CT Score) - Ganglionic level infarction (caudate, lentiform nuclei, internal capsule, insula, M1-M3 cortex): 7 - Supraganglionic infarction (M4-M6 cortex): 3 Total score (0-10 with 10 being normal): 10 Review of the MIP images confirms the above findings CTA NECK FINDINGS Aortic arch: Visualized aortic arch of normal caliber with normal 3 vessel morphology. Mild atheromatous plaque within the aortic arch. No hemodynamically significant stenosis seen about the origin of the great vessels. Visualized subclavian  arteries widely patent. Right carotid system: Right CCA tortuous proximally but widely patent to the bifurcation without stenosis. No significant atheromatous narrowing about the right bifurcation. Right ICA tortuous but widely patent to the skull base without stenosis, dissection, or occlusion. Left carotid system: Left CCA tortuous proximally but widely patent to the bifurcation without stenosis. No significant atheromatous narrowing about the left bifurcation. Left ICA tortuous but widely patent to the skull base without stenosis, dissection or occlusion. Vertebral arteries: Both vertebral arteries arise from the subclavian arteries. Vertebral arteries tortuous proximally but widely patent to the skull base without stenosis, dissection or occlusion. Skeleton: No acute osseous finding. No discrete lytic or blastic osseous lesions. Other neck: No other acute soft tissue abnormality within the neck. Subcentimeter hypodense left thyroid nodule noted, of doubtful significance. Torus palatini and torus mandibularis noted. Upper chest: Visualized upper chest demonstrates no acute finding. Left-sided pacemaker/AICD noted. Median sternotomy wires noted as well. Review of the MIP images confirms the above findings CTA HEAD FINDINGS Anterior circulation: Petrous segments widely patent. Mild atheromatous plaque within the cavernous/supraclinoid ICAs without hemodynamically significant stenosis. A1 segments widely patent. Normal anterior communicating artery. Anterior cerebral arteries widely patent to their distal aspects without stenosis. Right M1 widely patent. Normal right MCA bifurcation. Distal right MCA branches well perfused. Left M1 widely patent. Normal left MCA bifurcation. There is a focal abrupt occlusion of a distal left M3 branch, superior division (series 8, image 94). Irregular attenuated flow seen distally. Distal left MCA branches otherwise well perfused to their distal aspects. Posterior circulation: Both  vertebral arteries widely patent to the vertebrobasilar junction. Posteroinferior cerebral arteries patent bilaterally. Basilar widely patent to its distal aspect without stenosis. Superior cerebral arteries perfuse bilaterally. Both of the posterior cerebral arteries primarily supplied via the basilar and are well perfused to their distal aspects. Venous sinuses: Patent. Anatomic variants: None significant. Review of the MIP images confirms the above findings IMPRESSION: CT HEAD IMPRESSION: 1. No acute intracranial abnormality identified. 2. Aspects equals 10/10. 3. Age-related cerebral atrophy with mild chronic microvascular ischemic disease. CTA HEAD AND NECK IMPRESSION: 1. Positive CTA  with abrupt occlusion of a distal left M3 branch, superior division. Finding likely embolic in nature. Irregular attenuated flow seen beyond the level of occlusion. 2. Mild atheromatous change for age elsewhere about the aortic arch and carotid siphons without hemodynamically significant stenosis. 3. Diffuse tortuosity about the proximal major arterial vasculature of the neck, suggesting chronic underlying hypertension. These results were communicated to Dr. Rory Percy at 10:41 pmon 7/27/2020by text page via the [facility] messaging system. Electronically Signed   By: Jeannine Boga M.D.   On: 09/10/2018 22:59   Vas [place] Lower Extremity Venous (dvt)  Result Date: 09/06/2018  Lower Venous Study Indications: Atrial thrombus.  Comparison Study: no prior Performing Technologist: June Leap RDMS, RVT  Examination Guidelines: A complete evaluation includes B-mode imaging, spectral Doppler, color Doppler, and power Doppler as needed of all accessible portions of each vessel. Bilateral testing is considered an integral part of a complete examination. Limited examinations for reoccurring indications may be performed as noted.  +---------+---------------+---------+-----------+----------+-------+ RIGHT     CompressibilityPhasicitySpontaneityPropertiesSummary +---------+---------------+---------+-----------+----------+-------+ CFV      Full           Yes      Yes                          +---------+---------------+---------+-----------+----------+-------+ SFJ      Full                                                 +---------+---------------+---------+-----------+----------+-------+ FV Prox  Full                                                 +---------+---------------+---------+-----------+----------+-------+ FV Mid   Full                                                 +---------+---------------+---------+-----------+----------+-------+ FV DistalFull                                                 +---------+---------------+---------+-----------+----------+-------+ PFV      Full                                                 +---------+---------------+---------+-----------+----------+-------+ POP      Full           Yes      Yes                          +---------+---------------+---------+-----------+----------+-------+ PTV      Full                                                 +---------+---------------+---------+-----------+----------+-------+  PERO     Full                                                 +---------+---------------+---------+-----------+----------+-------+   +---------+---------------+---------+-----------+----------+-------+ LEFT     CompressibilityPhasicitySpontaneityPropertiesSummary +---------+---------------+---------+-----------+----------+-------+ CFV      Full           Yes      Yes                          +---------+---------------+---------+-----------+----------+-------+ SFJ      Full                                                 +---------+---------------+---------+-----------+----------+-------+ FV Prox  Full                                                  +---------+---------------+---------+-----------+----------+-------+ FV Mid   Full                                                 +---------+---------------+---------+-----------+----------+-------+ FV DistalFull                                                 +---------+---------------+---------+-----------+----------+-------+ PFV      Full                                                 +---------+---------------+---------+-----------+----------+-------+ POP      Full           Yes      Yes                          +---------+---------------+---------+-----------+----------+-------+ PTV      Full                                                 +---------+---------------+---------+-----------+----------+-------+ PERO     Full                                                 +---------+---------------+---------+-----------+----------+-------+     Summary: Right: There is no evidence of deep vein thrombosis in the lower extremity. No cystic structure found in the popliteal fossa. Left: There is no evidence of deep vein thrombosis in the lower extremity. No cystic structure found in the popliteal fossa.  *See table(s) above for measurements  and observations. Electronically signed by Monica Martinez MD on 09/06/2018 at 5:41:10 PM.    Final    [place] Ekg Site Rite  Result Date: 09/06/2018 If Site Rite image not attached, placement could not be confirmed due to current cardiac rhythm.     Procedures:   Subjective: Patient is feeling better, but continue to be very weak and deconditioned, no nausea or vomiting, no chest pain, dyspnea or palpitations.   Discharge Exam: Vitals:   09/14/18 0800 09/14/18 0940  BP: 134/68 117/69  Pulse: 68 75  Resp: 13 15  Temp: 97.7 F (36.5 C) 98.1 F (36.7 C)  SpO2: 98% 96%   Vitals:   09/13/18 2303 09/14/18 0402 09/14/18 0800 09/14/18 0940  BP: (!) 124/54 131/69 134/68 117/69  Pulse: 64 65 68 75  Resp: _0 Temp:  98.1 F (36.7 C) 97.9 F (36.6 C) 97.7 F (36.5 C) 98.1 F (36.7 C)  TempSrc: Oral Oral Oral Oral  SpO2: 97% 96% 98% 96%  Weight:      Height:        General: Not in pain or dyspnea  Neurology: Awake and alert, non focal. Slow speech, positive resting and intentional tremors.   E ENT: no pallor, no icterus, oral mucosa moist Cardiovascular: No JVD. S1-S2 present, rhythmic, no gallops, rubs, or murmurs. No lower extremity edema. Pulmonary: positive breath sounds bilaterally, adequate air movement, no wheezing, rhonchi or rales. Gastrointestinal. Abdomen withno organomegaly, non tender, no rebound or guarding Skin. No rashes Musculoskeletal: no joint deformities   The results of significant diagnostics from this hospitalization (including imaging, microbiology, ancillary and laboratory) are listed below for reference.     Microbiology: Recent Results (from the past 240 hour(s))  SARS Coronavirus 2 (CEPHEID - Performed in [place] hospital lab), Hosp Order     Status: None   Collection Time: 09/11/18 12:05 AM   Specimen: Nasopharyngeal Swab  Result Value Ref Range Status   SARS Coronavirus 2 NEGATIVE NEGATIVE Final    Comment: (NOTE) If result is NEGATIVE SARS-CoV-2 target nucleic acids are NOT DETECTED. The SARS-CoV-2 RNA is generally detectable in upper and lower  respiratory specimens during the acute phase of infection. The lowest  concentration of SARS-CoV-2 viral copies this assay can detect is 250  copies / mL. A negative result does not preclude SARS-CoV-2 infection  and should not be used as the sole basis for treatment or other  patient management decisions.  A negative result may occur with  improper specimen collection / handling, submission of specimen other  than nasopharyngeal swab, presence of viral mutation(s) within the  areas targeted by this assay, and inadequate number of viral copies  (<250 copies / mL). A negative result must be combined with clinical   observations, patient history, and epidemiological information. If result is POSITIVE SARS-CoV-2 target nucleic acids are DETECTED. The SARS-CoV-2 RNA is generally detectable in upper and lower  respiratory specimens dur ing the acute phase of infection.  Positive  results are indicative of active infection with SARS-CoV-2.  Clinical  correlation with patient history and other diagnostic information is  necessary to determine patient infection status.  Positive results do  not rule out bacterial infection or co-infection with other viruses. If result is PRESUMPTIVE POSTIVE SARS-CoV-2 nucleic acids MAY BE PRESENT.   A presumptive positive result was obtained on the submitted specimen  and confirmed on repeat testing.  While 2019 novel coronavirus  (SARS-CoV-2) nucleic acids may be present in  the submitted sample  additional confirmatory testing may be necessary for epidemiological  and / or clinical management purposes  to differentiate between  SARS-CoV-2 and other Sarbecovirus currently known to infect humans.  If clinically indicated additional testing with an alternate test  methodology [phone]) is advised. The SARS-CoV-2 RNA is generally  detectable in upper and lower respiratory sp ecimens during the acute  phase of infection. The expected result is Negative. Fact Sheet for Patients:  StrictlyIdeas.no Fact Sheet for Healthcare Providers: BankingDealers.co.za This test is not yet approved or cleared by the [place] FDA and has been authorized for detection and/or diagnosis of SARS-CoV-2 by FDA under an Emergency Use Authorization (EUA).  This EUA will remain in effect (meaning this test can be used) for the duration of the COVID-19 declaration under Section 564(b)(1) of the Act, 21 U.S.C. section 360bbb-3(b)(1), unless the authorization is terminated or revoked sooner. Performed at [facility], [place] [address],  [place], [place] [address]   Urine Culture     Status: None   Collection Time: 09/11/18 12:10 AM   Specimen: Urine, Random  Result Value Ref Range Status   Specimen Description URINE, RANDOM  Final   Special Requests NONE  Final   Culture   Final    NO GROWTH Performed at [facility], [place] [address], [place], [place] [address]    Report Status 09/12/2018 FINAL  Final     Labs: BNP (last 3 results) Recent Labs    02/11/18 0340  BNP 95.0   Basic Metabolic Panel: Recent Labs  Lab 09/10/18 2205 09/10/18 2207 09/11/18 0413 09/12/18 1104  NA 140 139 139 140  K 4.5 4.4 4.8 4.4  CL 104 103 104 107  CO2 26  --  29 27  GLUCOSE 132* 126* 115* 127*  BUN 27* 29* 22 17  CREATININE 1.34* 1.30* 1.27* 1.01*  CALCIUM 10.6*  --  10.3 10.3   Liver Function Tests: Recent Labs  Lab 09/10/18 2205  AST 33  ALT 24  ALKPHOS 51  BILITOT 0.4  PROT 5.7*  ALBUMIN 3.5   No results for input(s): LIPASE, AMYLASE in the last 168 hours. No results for input(s): AMMONIA in the last 168 hours. CBC: Recent Labs  Lab 09/10/18 2205 09/10/18 2207 09/11/18 0413  WBC 6.2  --  5.1  NEUTROABS 3.6  --   --   HGB 11.9* 12.9 11.0*  HCT 38.2 38.0 34.7*  MCV 99.5  --  97.2  PLT 111*  --  100*   Cardiac Enzymes: No results for input(s): CKTOTAL, CKMB, CKMBINDEX, TROPONINI in the last 168 hours. BNP: Invalid input(s): POCBNP CBG: Recent Labs  Lab 09/10/18 2203  GLUCAP 125*   D-Dimer No results for input(s): DDIMER in the last 72 hours. Hgb A1c No results for input(s): HGBA1C in the last 72 hours. Lipid Profile No results for input(s): CHOL, HDL, LDLCALC, TRIG, CHOLHDL, LDLDIRECT in the last 72 hours. Thyroid function studies No results for input(s): TSH, T4TOTAL, T3FREE, THYROIDAB in the last 72 hours.  Invalid input(s): FREET3 Anemia work up No results for input(s): VITAMINB12, FOLATE, FERRITIN, TIBC, IRON, RETICCTPCT in the last 72 hours. Urinalysis    Component Value  Date/Time   COLORURINE YELLOW 09/10/2018 2308   APPEARANCEUR HAZY (A) 09/10/2018 2308   LABSPEC 1.041 (H) 09/10/2018 2308   PHURINE 5.0 09/10/2018 2308   GLUCOSEU NEGATIVE 09/10/2018 2308   HGBUR SMALL (A) 09/10/2018 2308   Tusculum NEGATIVE 09/10/2018 2308  KETONESUR NEGATIVE 09/10/2018 2308   PROTEINUR NEGATIVE 09/10/2018 2308   UROBILINOGEN 0.2 04/17/2009 1759   NITRITE NEGATIVE 09/10/2018 2308   LEUKOCYTESUR MODERATE (A) 09/10/2018 2308   Sepsis Labs Invalid input(s): PROCALCITONIN,  WBC,  LACTICIDVEN Microbiology Recent Results (from the past 240 hour(s))  SARS Coronavirus 2 (CEPHEID - Performed in [place] hospital lab), Hosp Order     Status: None   Collection Time: 09/11/18 12:05 AM   Specimen: Nasopharyngeal Swab  Result Value Ref Range Status   SARS Coronavirus 2 NEGATIVE NEGATIVE Final    Comment: (NOTE) If result is NEGATIVE SARS-CoV-2 target nucleic acids are NOT DETECTED. The SARS-CoV-2 RNA is generally detectable in upper and lower  respiratory specimens during the acute phase of infection. The lowest  concentration of SARS-CoV-2 viral copies this assay can detect is 250  copies / mL. A negative result does not preclude SARS-CoV-2 infection  and should not be used as the sole basis for treatment or other  patient management decisions.  A negative result may occur with  improper specimen collection / handling, submission of specimen other  than nasopharyngeal swab, presence of viral mutation(s) within the  areas targeted by this assay, and inadequate number of viral copies  (<250 copies / mL). A negative result must be combined with clinical  observations, patient history, and epidemiological information. If result is POSITIVE SARS-CoV-2 target nucleic acids are DETECTED. The SARS-CoV-2 RNA is generally detectable in upper and lower  respiratory specimens dur ing the acute phase of infection.  Positive  results are indicative of active infection with  SARS-CoV-2.  Clinical  correlation with patient history and other diagnostic information is  necessary to determine patient infection status.  Positive results do  not rule out bacterial infection or co-infection with other viruses. If result is PRESUMPTIVE POSTIVE SARS-CoV-2 nucleic acids MAY BE PRESENT.   A presumptive positive result was obtained on the submitted specimen  and confirmed on repeat testing.  While 2019 novel coronavirus  (SARS-CoV-2) nucleic acids may be present in the submitted sample  additional confirmatory testing may be necessary for epidemiological  and / or clinical management purposes  to differentiate between  SARS-CoV-2 and other Sarbecovirus currently known to infect humans.  If clinically indicated additional testing with an alternate test  methodology [phone]) is advised. The SARS-CoV-2 RNA is generally  detectable in upper and lower respiratory sp ecimens during the acute  phase of infection. The expected result is Negative. Fact Sheet for Patients:  StrictlyIdeas.no Fact Sheet for Healthcare Providers: BankingDealers.co.za This test is not yet approved or cleared by the [place] FDA and has been authorized for detection and/or diagnosis of SARS-CoV-2 by FDA under an Emergency Use Authorization (EUA).  This EUA will remain in effect (meaning this test can be used) for the duration of the COVID-19 declaration under Section 564(b)(1) of the Act, 21 U.S.C. section 360bbb-3(b)(1), unless the authorization is terminated or revoked sooner. Performed at [facility], [place] [address] [place], [place] [address]   Urine Culture     Status: None   Collection Time: 09/11/18 12:10 AM   Specimen: Urine, Random  Result Value Ref Range Status   Specimen Description URINE, RANDOM  Final   Special Requests NONE  Final   Culture   Final    NO GROWTH Performed at [facility], [place] [address]  [place], [place] [address]    Report Status 09/12/2018 FINAL  Final     Time  coordinating discharge: 45 minutes  SIGNED:   Tawni Millers, MD  [facility] 09/14/2018, 10:17 AM

## 2018-09-14 NOTE — Care Management Important Message (Signed)
Important Message  Patient Details  Name: [name] MRN: [id number] Date of Birth: 04-29-1929   Medicare Important Message Given:  Yes     Orbie Pyo 09/14/2018, 2:45 PM

## 2018-09-14 NOTE — Discharge Instructions (Signed)

## 2018-09-25 ENCOUNTER — Telehealth: Payer: Self-pay | Admitting: *Deleted

## 2018-09-25 ENCOUNTER — Ambulatory Visit (INDEPENDENT_AMBULATORY_CARE_PROVIDER_SITE_OTHER): Payer: Medicare Other | Admitting: Internal Medicine

## 2018-09-25 ENCOUNTER — Other Ambulatory Visit: Payer: Self-pay

## 2018-09-25 ENCOUNTER — Encounter: Payer: Self-pay | Admitting: Internal Medicine

## 2018-09-25 VITALS — BP 151/81 | HR 99

## 2018-09-25 DIAGNOSIS — D696 Thrombocytopenia, unspecified: Secondary | ICD-10-CM

## 2018-09-25 DIAGNOSIS — N183 Chronic kidney disease, stage 3 unspecified: Secondary | ICD-10-CM

## 2018-09-25 DIAGNOSIS — R7881 Bacteremia: Secondary | ICD-10-CM

## 2018-09-25 NOTE — Assessment & Plan Note (Signed)
Renal function has remained stable on medication

## 2018-09-25 NOTE — Telephone Encounter (Signed)
Ann at [facility] called to see if Dr Linus Salmons would give verbal order for PICC to be pulled on 8/17 at [facility] rather than 8/15.  Per Lelon Frohlich, she will not have anyone available to pull the PICC until the 17th, PICC care and maintenance will be covered by their protocols until the PICC is removed. Please advise if ok to wait until 8/17. Landis Gandy, RN

## 2018-09-25 NOTE — Assessment & Plan Note (Signed)
Doing well and will complete treatment.  I have asked the NH to do 2 sets of blood cultures 5-7 days after her last dose of antibiotics to be sure group B Strep does not grow again.  Follow up as needed

## 2018-09-25 NOTE — Assessment & Plan Note (Signed)
Stable level on antibiotics.

## 2018-09-25 NOTE — Progress Notes (Signed)
   Subjective:    Patient ID: [name], female    DOB: 1929/12/13, 85 y.o.   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  TEE with no vegetation though a thrombus noted.  Placed on ceftriaxone for 4 weeks and completing antibiotics 8/15.  No new issues with no associated rash or diarrhea.  Her course has been complicated by a CVA and she was rehospitalized after discharge and now in rehab.  Picc line working well.  Here with her son.   Labs evaluated from 8/10 and WBC wnl, platelets stable at 105.    Review of Systems  Gastrointestinal: Negative for diarrhea.  Skin: Negative for rash.       Objective:   Physical Exam Constitutional:      Appearance: Normal appearance.  Eyes:     General: No scleral icterus. Musculoskeletal:        General: No swelling.     Comments: picc with some dried blood but no drainage  Skin:    Findings: Bruising present. No rash.  Neurological:     Mental Status: She is alert.  Psychiatric:        Mood and Affect: Mood normal.   SH: now at [facility]:

## 2018-09-25 NOTE — Telephone Encounter (Signed)
Ok with me 

## 2018-09-26 NOTE — Telephone Encounter (Signed)
Left message for director of nursing with DR Comer's verbal order.

## 2018-10-02 ENCOUNTER — Telehealth: Payer: Self-pay | Admitting: Cardiovascular Disease

## 2018-10-02 ENCOUNTER — Telehealth: Payer: Self-pay | Admitting: Internal Medicine

## 2018-10-02 NOTE — Telephone Encounter (Signed)
Returned the call to the patient's son. He was calling to give an update on his mother. She was recently diagnosed with a mini stroke and now is at [facility] for rehab.  She currently has been diagnosed with pneumonia.   He has asked that we call [facility] and get the records from them. He would like for Dr. Sallyanne Kuster to know what has been going on and to see if he has any recommendations.

## 2018-10-02 NOTE — Telephone Encounter (Signed)
New message  Patient is currently at [facility], patient's son states that patient was too weak to return home after having a mini stroke on 09/10/18 and had to go to the rehab facility. Also states that the patient is still having swelling in her ankles and is still being instructed elevate them and Lasix is being increased. Patient has now been diagnosed with Pneumonia and has fluid in her lower right lung. Patient's son is concerned with the fluid being in her lung and is concerned that there may be fluid around her heart to. Please give patient's son a call back to discuss.

## 2018-10-02 NOTE — Telephone Encounter (Signed)
No message needed °

## 2018-10-04 NOTE — Telephone Encounter (Signed)
Spoke with pt son, aware lisa is not here today and will forward to her to follow up with him tomorrow. He reports they are going to start wrapping her legs during the day and unwrapping them at night. Will forward to lisa, dr croitoru's nurse.

## 2018-10-04 NOTE — Telephone Encounter (Signed)
Pt's son called just following up on answer to his earlier question please give him a call back when ready.

## 2018-10-05 ENCOUNTER — Telehealth: Payer: Self-pay | Admitting: Cardiovascular Disease

## 2018-10-05 NOTE — Telephone Encounter (Signed)
Spoke with the son. He has been advised that I have reached out to [facility] on several occasions to get the records from them but have not heard back (Medical records-[phone])  He has also been advised that Dr. Sallyanne Kuster would be made aware of the update on the patient's condition upon his return next week.  He verbalized his understanding.

## 2018-10-05 NOTE — Telephone Encounter (Signed)
Message left at [facility] records to call back.  Message left with the son to call back as well.

## 2018-10-05 NOTE — Telephone Encounter (Signed)
The son called back to give [place] the number of the nurse supervisor at [facility]. Left a message for her to call back: [phone]

## 2018-10-05 NOTE — Telephone Encounter (Signed)
Duplicate

## 2018-10-05 NOTE — Telephone Encounter (Signed)
New message:   Patient son returning your call back.

## 2018-10-08 NOTE — Telephone Encounter (Signed)
Is she strong enough to stand on a scale? Info about her weight could be very helpful in adjusting the furosemide dose. Please make sure she is on a sodium restricted diet.

## 2018-10-08 NOTE — Telephone Encounter (Signed)
Left a message for the nurse supervisor to call back.

## 2018-10-12 NOTE — Telephone Encounter (Signed)
Left another message for the supervisor to call back.

## 2018-10-16 ENCOUNTER — Telehealth: Payer: Self-pay | Admitting: *Deleted

## 2018-10-16 NOTE — Telephone Encounter (Signed)
Patient daughter called to ask if we had gotten the final lab results back to make sure the infection had cleared. She advised it was sent out to a different lab and the provider said it would take at least 2 weeks. Advised her nothing in the system an I will have to ask the provider and someone will give her a call when he responds.

## 2018-10-17 NOTE — Telephone Encounter (Signed)
Yea, the NH was doing it and I am not sure when they sent the blood cultures but should be final after 1 week. thanks

## 2018-10-18 ENCOUNTER — Ambulatory Visit (INDEPENDENT_AMBULATORY_CARE_PROVIDER_SITE_OTHER): Payer: Medicare Other | Admitting: Neurology

## 2018-10-18 ENCOUNTER — Encounter: Payer: Self-pay | Admitting: Neurology

## 2018-10-18 ENCOUNTER — Other Ambulatory Visit: Payer: Self-pay

## 2018-10-18 DIAGNOSIS — G301 Alzheimer's disease with late onset: Secondary | ICD-10-CM | POA: Diagnosis not present

## 2018-10-18 DIAGNOSIS — F0390 Unspecified dementia without behavioral disturbance: Secondary | ICD-10-CM

## 2018-10-18 DIAGNOSIS — F0280 Dementia in other diseases classified elsewhere without behavioral disturbance: Secondary | ICD-10-CM

## 2018-10-18 HISTORY — DX: Unspecified dementia, unspecified severity, without behavioral disturbance, psychotic disturbance, mood disturbance, and anxiety: F03.90

## 2018-10-18 NOTE — Progress Notes (Signed)
Reason for visit: Memory disturbance, tremor, recent stroke  Referring physician: Dr. Abundio Miu [name] is a 85 y.o. female  History of present illness:  [name] is an 85 year old right-handed white female with a history of a progressive memory disturbance over the last 2 years or so.  The patient had been living at home with her son since Jul 14, 2017.  Her husband died in 2017/07/14.  The patient has required assistance keeping up with medications and appointments, she no longer operates a motor vehicle, she has not driven in over 10 years.  She has had some problems with gait instability, she has been using a walker over the last 5 years.  She has a history of rheumatoid arthritis, and a history of atrial fibrillation.  The patient recently was in the hospital on 10 September 2018 with onset of aphasia that fortunately was transient nature.  The patient was found to have an embolic stroke affecting the M3 segment of the middle cerebral artery on the left.  The patient had a demonstrated clot in left atrium, she was under anticoagulated with Eliquis.  The dosing of Eliquis was adjusted.  The patient currently is in [facility] rehab facility, she has had a recent bout of urinary tract infection, sepsis, and pneumonia.  She is getting physical therapy but she is not back to her baseline yet.  The patient is limited with her ability to walk with a walker.  She has no residual visual changes or speech changes.  She is swallowing fairly well but at times has trouble swallowing pills.  She has had problems with tremors over the last 20 years, her mother also had a similar tremor.  The patient comes to this office for an evaluation.  She has not been on any medications for memory in the past.  Past Medical History:  Diagnosis Date  . Anxiety   . Arthritis    "mostly my hands" (01/29/2015)  . Basal cell carcinoma of forehead   . Benign essential tremor   . CAP (community acquired pneumonia)  01/29/2015  . CHF (congestive heart failure) ([place])   . Depression   . Edema of both legs 04-23-2013  pt states feet swelling (wears ted hose)   bilateral venous ablation procedures done in IR, L>R  . GERD (gastroesophageal reflux disease)   . H/O hiatal hernia   . Hyperlipidemia   . Hyperparathyroidism ([place])    MILD--  STABLE  . Hypertension   . Kidney stones   . Osteopenia   . Permanent atrial fibrillation    CARDIOLOGIST-- DR NO:[id number]  . Pneumonia "several times"  . Presence of permanent cardiac pacemaker    LAST PACER CHECK  04-23-2013  IN [facility]  . Right ureteral stone   . S/P mitral valve replacement with bioprosthetic valve    04-03-2009  . Schatzki's ring   . Sick sinus syndrome ([place])    S/P  PACEMAKER  2009  . Thrombocytopenia, immune ([place])    CHRONIC---  HEMOTOLOGIST---  DR ENNEVER  . Venous insufficiency, peripheral   . Wears glasses     Past Surgical History:  Procedure Laterality Date  . CARDIAC CATHETERIZATION  03/27/2009   normal coronaries; grade IV Mitral Insuff.  Marland Kitchen CARDIAC CATHETERIZATION  2001  &  2006  . CARDIAC VALVE REPLACEMENT    . CATARACT EXTRACTION W/ INTRAOCULAR LENS  IMPLANT, BILATERAL Bilateral ~ 2012  . CYSTOSCOPY W/ URETERAL STENT PLACEMENT Left 08/01/2012  Procedure: CYSTOSCOPY WITH RETROGRADE PYELOGRAM/URETERAL STENT PLACEMENT;  Surgeon: Dutch Gray, MD;  Location: [facility];  Service: Urology;  Laterality: Left;  . CYSTOSCOPY WITH URETEROSCOPY Left 08/14/2012   Procedure: LEFT URETEROSCOPY WITH HOLMIUM LASER AND LEFT  STENT PLACEMENT;  Surgeon: Malka So, MD;  Location: [facility];  Service: Urology;  Laterality: Left;  . CYSTOSCOPY WITH URETEROSCOPY AND STENT PLACEMENT Right 04/25/2013   Procedure: RIGHT URETEROSCOPY WITH  STENT PLACEMENT;  Surgeon: Irine Seal, MD;  Location[facility];  Service: Urology;  Laterality: Right;  . ESOPHAGOGASTRODUODENOSCOPY (EGD) WITH ESOPHAGEAL DILATION  "a couple times" (01/29/2015)  . EXTRACORPOREAL  SHOCK WAVE LITHOTRIPSY Left 07-26-2012  . HAND SURGERY Right 05/2011   "no fracture; straightened things up  . HAND SURGERY Left 2014   "no fracture; straightened things up"  . HOLMIUM LASER APPLICATION Left [id number]   Procedure:  HOLMIUM LASER APPLICATION;  Surgeon: Malka So, MD;  Location: [facility];  Service: Urology;  Laterality: Left;  . HOLMIUM LASER APPLICATION Right [id number]   Procedure: HOLMIUM LASER APPLICATION;  Surgeon: Irine Seal, MD;  Location: [facility];  Service: Urology;  Laterality: Right;  . MITRAL VALVE REPLACEMENT WITH CHORDAL PRESEVATION USING BIOPROSTHETIC VALVE/  LIGATION LEFT ATRIAL APPENDAGE/  LEFT-SIDED MAZE PROCEDURE  04-03-2009  DR Ethel Rana,  SERIAL # [id number]  . NM MYOCAR PERF WALL MOTION  03/01/1999   mild lateral ischemia  . PERMANENT PACEMAKER INSERTION  02/26/2007   Dual Chamber Med[employer]onic Programmed VVIR--- Generator [id number]/  [id number]   . PPM GENERATOR CHANGEOUT N/A 08/20/2018   Procedure: PPM GENERATOR CHANGEOUT;  Surgeon: Sanda Klein, MD;  Location: [facility];  Service: Cardiovascular;  Laterality: N/A;  . TEE WITHOUT CARDIOVERSION N/A 09/05/2018   Procedure: TRANSESOPHAGEAL ECHOCARDIOGRAM (TEE);  Surgeon: Pixie Casino, MD;  Location: [facility];  Service: Cardiovascular;  Laterality: N/A;  . TRANSTHORACIC ECHOCARDIOGRAM  09-07-2012  DR CROITORU   MILD LVH/  EF 55-60%/  MILD TO MODERATE AR/   SEVERE LAE  &  RAE/  MILD TO MODERATE TR/   MITRAL VALVE BIOPROSTHESIS LEAFLET NORMAL,  MODERATE STENOSIS WITH NO SIG. REGURG/  MODERATE RVE  . VAGINAL HYSTERECTOMY  1982    Family History  Problem Relation Age of Onset  . Cancer Mother   . Heart attack Father     Social history:  reports that she has never smoked. She has never used smokeless tobacco. She reports that she does not drink alcohol or use drugs.  Medications:  Prior to Admission medications   Medication Sig Start Date End Date Taking? Authorizing  Provider  acetaminophen (TYLENOL) 500 MG tablet Take 500-1,000 mg by mouth daily as needed for moderate pain.    [provider]  ALPRAZolam Duanne Moron) 0.5 MG tablet Take 0.5-1 tablets (0.25-0.5 mg total) by mouth See admin instructions. Take 1/2 tablet every morning and take 1 tablet at night 09/14/18   Arrien, Jimmy Picket, MD  amLODipine-olmesartan (AZOR) 5-20 MG tablet TAKE 1 TABLET BY MOUTH EVERY DAY Patient taking differently: Take 1 tablet by mouth daily.  08/06/18   Croitoru, Mihai, MD  apixaban (ELIQUIS) 5 MG TABS tablet Take 1 tablet (5 mg total) by mouth 2 (two) times daily. 09/14/18 10/14/18  Arrien, Jimmy Picket, MD  B Complex Vitamins (VITAMIN B COMPLEX PO) Take 1 tablet by mouth daily.     [provider]  cholecalciferol (VITAMIN D3) 25 MCG (1000 UT) tablet Take 1,000 Units  by mouth daily.    [provider]  furosemide (LASIX) 40 MG tablet Take 1 tablet (40 mg total) by mouth daily as needed for fluid or edema. Reported on 01/28/2015 02/12/18   Lavina Hamman, MD  KLOR-CON M20 20 MEQ tablet Take 1 tablet (20 mEq total) by mouth daily as needed (take with furosemide or lasix). Patient taking differently: Take 20 mEq by mouth at bedtime.  02/12/18   Lavina Hamman, MD  omeprazole (PRILOSEC) 20 MG capsule Take 20 mg by mouth 2 (two) times a day.     [provider]  OVER THE COUNTER MEDICATION Take 1 tablet by mouth 2 (two) times daily. Nutrifuron (immune supplement)    [provider]  Probiotic CAPS Take 1 capsule by mouth daily.    [provider]  simvastatin (ZOCOR) 10 MG tablet TAKE 1 TABLET BY MOUTH EVERY DAY AT [facility] Patient taking differently: Take 10 mg by mouth at bedtime.  03/30/15   Medina-Vargas, Monina C, NP  Vit B6-Vit B12-Omega 3 Acids (VITAMIN B PLUS+ PO) Take 1 tablet by mouth daily.    [provider]      Allergies  Allergen Reactions  . Zoloft [Sertraline Hcl]     ROS:  Out of a complete 14  system review of symptoms, the patient complains only of the following symptoms, and all other reviewed systems are negative.  Memory disturbance Tremor Walking problems Joint pains  Blood pressure (!) 148/77, pulse 82.  Physical Exam  General: The patient is alert and cooperative at the time of the examination.  Eyes: Pupils are equal, round, and reactive to light. Discs are flat bilaterally.  Neck: The neck is supple, no carotid bruits are noted.  Respiratory: The respiratory examination is clear.  Cardiovascular: The cardiovascular examination reveals an occasionally irregular rate and rhythm, no obvious murmurs or rubs are noted.  Skin: Extremities are without significant edema.  Neurologic Exam  Mental status: The patient is alert and oriented x 3 at the time of the examination. The Mini-Mental status examination done today shows a total score of 18/30.  Cranial nerves: Facial symmetry is present. There is good sensation of the face to pinprick and soft touch bilaterally. The strength of the facial muscles and the muscles to head turning and shoulder shrug are normal bilaterally. Speech is well enunciated, no aphasia or dysarthria is noted. Extraocular movements are full. Visual fields are full. The tongue is midline, and the patient has symmetric elevation of the soft palate. No obvious hearing deficits are noted.  Some head and neck tremor was noted.  Motor: The motor testing reveals 5 over 5 strength of all 4 extremities. Good symmetric motor tone is noted throughout.  Sensory: Sensory testing is intact to pinprick, soft touch, vibration sensation, and position sense on all 4 extremities, with exception of some decreased position sense on the left foot. No evidence of extinction is noted.  Coordination: Cerebellar testing reveals good finger-nose-finger bilaterally.  The patient has difficulty performing heel-to-shin on either side.  Gait and station: The patient can stand  with assistance, once up, she can take a few steps with the examiner.  She has a wide based unsteady gait.  She can only stand for several minutes without fatigue.  Reflexes: Deep tendon reflexes are symmetric, but are depressed bilaterally. Toes are downgoing bilaterally.   CT head 09/12/18:  IMPRESSION: 1. Small new cortical infarct in the left middle frontal gyrus. See coronal image 42. No  associated hemorrhage or mass effect. 2. Otherwise stable non contrast CT appearance of the brain.  * CT scan images were reviewed online. I agree with the written report.   CTA head and neck 09/10/18:  CTA HEAD AND NECK IMPRESSION:  1. Positive CTA with abrupt occlusion of a distal left M3 branch, superior division. Finding likely embolic in nature. Irregular attenuated flow seen beyond the level of occlusion. 2. Mild atheromatous change for age elsewhere about the aortic arch and carotid siphons without hemodynamically significant stenosis. 3. Diffuse tortuosity about the proximal major arterial vasculature of the neck, suggesting chronic underlying hypertension.   TEE 09/05/18:  IMPRESSIONS    1. The left ventricle has normal systolic function, with an ejection fraction of 60-65%. The cavity size was normal. There is mildly increased left ventricular wall thickness. No evidence of left ventricular regional wall motion abnormalities.  2. The right ventricle has normal systolc function. The cavity was normal. There is no increase in right ventricular wall thickness.  3. Large mobile left atrial thrombus on the atrial septum.  4. Left atrial size was severely dilated.  5. Right atrial size was mildly dilated.  6. Moderate mitral valve stenosis.  7. The tricuspid valve was grossly normal. Tricuspid valve regurgitation is moderate.  8. The aortic valve is tricuspid Moderate sclerosis of the aortic valve. Aortic valve regurgitation is mild by color flow Doppler. No stenosis of the aortic  valve.  9. The ascending aorta and aortic root are normal in size and structure.     Assessment/Plan:  1.  Progressive memory disturbance, Alzheimer's disease  2.  Recent left brain stroke, embolic  3.  Atrial fibrillation, left atrial clot  4.  Essential tremor  5.  Gait disorder  The patient is not at her baseline with her ability to ambulate or with her mentation.  The patient will remain at [facility] for now.  I discussed the possibility of starting a medication such as Aricept and Namenda, the son wishes to discuss this with his sister, they will contact me if they desire to go on medication.  The patient will follow-up in 6 months for reevaluation.  Jill Alexanders MD 10/18/2018 5:01 PM  [facility] [address] [place], [place] [address]  Phone [phone] Fax [phone]

## 2018-10-19 ENCOUNTER — Telehealth: Payer: Self-pay | Admitting: Neurology

## 2018-10-19 NOTE — Telephone Encounter (Signed)
[name] from [facility] called needing to speak to the RN for clarification on the orders from the pt's last visit. Please advise.

## 2018-10-19 NOTE — Telephone Encounter (Signed)
Left a message for the family to call back. Have been unable to reach anyone at [facility] and they are not returning any phone messages.   Message left with the son to inquire if anything further was needed.

## 2018-10-23 NOTE — Telephone Encounter (Signed)
° ° ° °  Please return call to son 

## 2018-10-23 NOTE — Telephone Encounter (Signed)
I reached out to [facility] and spoke with Anne(nurse). I advised on the recommendation from Dr. Jannifer Franklin on 10/18/2018. The patient is not at her baseline with her ability to ambulate or with her mentation.  The patient will remain at [facility] for now.  I discussed the possibility of starting a medication such as Aricept and Namenda, the son wishes to discuss this with his sister, they will contact me if they desire to go on medication.  She verbalized understanding.

## 2018-10-23 NOTE — Telephone Encounter (Signed)
Returned call to son, notified of [facility] issue and them not calling back for lasix dosage and diet restriction. He states that he has a contact there he will call them and have them call us to discuss these issues

## 2018-10-29 ENCOUNTER — Telehealth: Payer: Self-pay | Admitting: Cardiovascular Disease

## 2018-10-29 NOTE — Telephone Encounter (Signed)
New Message   Pt c/o medication issue:  1. Name of Medication: amLODipine-olmesartan (AZOR) 5-20 MG tablet and furosemide (LASIX) 40 MG tablet and KLOR-CON M20 20 MEQ tablet     2. How are you currently taking this medication (dosage and times per day)?   3. Are you having a reaction (difficulty breathing--STAT)?   4. What is your medication issue? Patient son is calling to see if the patient needs to be back on the amlodipine-olmesartan because she came home from [place] and it not part of her medication. He is also inquiring about whether or not the patient should continue with the lasix and if so at what dosage furosemide (LASIX) 40 MG tablet and if the KLOR-CON M20 20 MEQ tablet Is in conjuntion with the lasix.

## 2018-10-29 NOTE — Telephone Encounter (Signed)
Let's try switching both the furosemide and the KCl to every other day and make sure she takes it in the mornings. Please weigh daily and call if she gains > 3lb or develops edema again.

## 2018-10-29 NOTE — Telephone Encounter (Signed)
Spoke with patients son regarding medications changes made when she came home from [facility]  Lasix 40 mg daily  KlorCon 20 meq daily   Was as needed. She was having swelling in her ankles which has improved. She is getting up 6-8 times a night and her son has to get up with her  Taking Xanax 1 mg twice a day now. Son wants to make sure this is appropriate.   Azor was not on her list when she left [facility].  Left message with Tarri Glenn [phone] to call back regarding why Azor may have been stopped.  Son does not have a way of checking her blood pressure at home but [facility] came to see her today and blood pressure 110/70  Will forward to Dr Sallyanne Kuster for review

## 2018-10-29 NOTE — Telephone Encounter (Signed)
If she has been off Azor all the time when she was at [facility] and this is her BP (110/70), she does not need it. We do need to get the [facility] from [facility] to be sure. How often will [facility] come and for how long? Xanax is OK, but I would try too make an effort to slowly wean off it. Maybe start by cutting the daytime dose in half, then after a week or so try stopping it altogether and just take before bedtime.

## 2018-10-30 NOTE — Telephone Encounter (Signed)
Advised son of recommendations  BLOOD PRESSURE 100/70 YESTERDAY WITH [facility] NOT 110/70

## 2018-10-30 NOTE — Telephone Encounter (Signed)
Left message to call back  

## 2018-10-31 ENCOUNTER — Observation Stay (HOSPITAL_BASED_OUTPATIENT_CLINIC_OR_DEPARTMENT_OTHER)
Admission: EM | Admit: 2018-10-31 | Discharge: 2018-11-02 | Disposition: A | Discharge status: Home / Self Care | Payer: Medicare Other | Attending: Emergency Medicine | Admitting: Emergency Medicine

## 2018-10-31 ENCOUNTER — Encounter (HOSPITAL_BASED_OUTPATIENT_CLINIC_OR_DEPARTMENT_OTHER): Payer: Self-pay

## 2018-10-31 ENCOUNTER — Emergency Department (HOSPITAL_BASED_OUTPATIENT_CLINIC_OR_DEPARTMENT_OTHER): Payer: Medicare Other

## 2018-10-31 ENCOUNTER — Other Ambulatory Visit: Payer: Self-pay

## 2018-10-31 DIAGNOSIS — N183 Chronic kidney disease, stage 3 unspecified: Secondary | ICD-10-CM | POA: Diagnosis present

## 2018-10-31 DIAGNOSIS — Z952 Presence of prosthetic heart valve: Secondary | ICD-10-CM | POA: Diagnosis not present

## 2018-10-31 DIAGNOSIS — F419 Anxiety disorder, unspecified: Secondary | ICD-10-CM | POA: Diagnosis present

## 2018-10-31 DIAGNOSIS — Z79899 Other long term (current) drug therapy: Secondary | ICD-10-CM | POA: Insufficient documentation

## 2018-10-31 DIAGNOSIS — Z953 Presence of xenogenic heart valve: Secondary | ICD-10-CM

## 2018-10-31 DIAGNOSIS — Z23 Encounter for immunization: Secondary | ICD-10-CM | POA: Diagnosis not present

## 2018-10-31 DIAGNOSIS — G308 Other Alzheimer's disease: Secondary | ICD-10-CM | POA: Diagnosis not present

## 2018-10-31 DIAGNOSIS — Z85828 Personal history of other malignant neoplasm of skin: Secondary | ICD-10-CM | POA: Insufficient documentation

## 2018-10-31 DIAGNOSIS — I4821 Permanent atrial fibrillation: Secondary | ICD-10-CM | POA: Diagnosis not present

## 2018-10-31 DIAGNOSIS — R2689 Other abnormalities of gait and mobility: Secondary | ICD-10-CM | POA: Insufficient documentation

## 2018-10-31 DIAGNOSIS — I130 Hypertensive heart and chronic kidney disease with heart failure and stage 1 through stage 4 chronic kidney disease, or unspecified chronic kidney disease: Secondary | ICD-10-CM | POA: Insufficient documentation

## 2018-10-31 DIAGNOSIS — R2990 Unspecified symptoms and signs involving the nervous system: Secondary | ICD-10-CM | POA: Diagnosis present

## 2018-10-31 DIAGNOSIS — F0390 Unspecified dementia without behavioral disturbance: Secondary | ICD-10-CM | POA: Diagnosis present

## 2018-10-31 DIAGNOSIS — Z20828 Contact with and (suspected) exposure to other viral communicable diseases: Secondary | ICD-10-CM | POA: Insufficient documentation

## 2018-10-31 DIAGNOSIS — R4182 Altered mental status, unspecified: Secondary | ICD-10-CM | POA: Insufficient documentation

## 2018-10-31 DIAGNOSIS — I509 Heart failure, unspecified: Secondary | ICD-10-CM | POA: Insufficient documentation

## 2018-10-31 DIAGNOSIS — F0281 Dementia in other diseases classified elsewhere with behavioral disturbance: Secondary | ICD-10-CM | POA: Diagnosis not present

## 2018-10-31 DIAGNOSIS — F0280 Dementia in other diseases classified elsewhere without behavioral disturbance: Secondary | ICD-10-CM

## 2018-10-31 DIAGNOSIS — D696 Thrombocytopenia, unspecified: Secondary | ICD-10-CM | POA: Diagnosis not present

## 2018-10-31 DIAGNOSIS — G459 Transient cerebral ischemic attack, unspecified: Secondary | ICD-10-CM

## 2018-10-31 DIAGNOSIS — Z950 Presence of cardiac pacemaker: Secondary | ICD-10-CM | POA: Diagnosis not present

## 2018-10-31 DIAGNOSIS — I639 Cerebral infarction, unspecified: Secondary | ICD-10-CM | POA: Diagnosis present

## 2018-10-31 DIAGNOSIS — I10 Essential (primary) hypertension: Secondary | ICD-10-CM | POA: Diagnosis present

## 2018-10-31 DIAGNOSIS — I69320 Aphasia following cerebral infarction: Secondary | ICD-10-CM | POA: Diagnosis not present

## 2018-10-31 DIAGNOSIS — G9340 Encephalopathy, unspecified: Secondary | ICD-10-CM | POA: Diagnosis present

## 2018-10-31 DIAGNOSIS — R4701 Aphasia: Secondary | ICD-10-CM | POA: Diagnosis present

## 2018-10-31 LAB — PROTIME-INR
INR: 1.2 (ref 0.8–1.2)
Prothrombin Time: 15.2 seconds (ref 11.4–15.2)

## 2018-10-31 LAB — DIFFERENTIAL
Abs Immature Granulocytes: 0.03 10*3/uL (ref 0.00–0.07)
Basophils Absolute: 0.1 10*3/uL (ref 0.0–0.1)
Basophils Relative: 1 %
Eosinophils Absolute: 0.1 10*3/uL (ref 0.0–0.5)
Eosinophils Relative: 3 %
Immature Granulocytes: 1 %
Lymphocytes Relative: 33 %
Lymphs Abs: 1.5 10*3/uL (ref 0.7–4.0)
Monocytes Absolute: 0.6 10*3/uL (ref 0.1–1.0)
Monocytes Relative: 14 %
Neutro Abs: 2.2 10*3/uL (ref 1.7–7.7)
Neutrophils Relative %: 48 %
Smear Review: NORMAL

## 2018-10-31 LAB — COMPREHENSIVE METABOLIC PANEL
ALT: 13 U/L (ref 0–44)
AST: 20 U/L (ref 15–41)
Albumin: 3.7 g/dL (ref 3.5–5.0)
Alkaline Phosphatase: 40 U/L (ref 38–126)
Anion gap: 7 (ref 5–15)
BUN: 27 mg/dL — ABNORMAL HIGH (ref 8–23)
CO2: 27 mmol/L (ref 22–32)
Calcium: 10.6 mg/dL — ABNORMAL HIGH (ref 8.9–10.3)
Chloride: 105 mmol/L (ref 98–111)
Creatinine, Ser: 1.16 mg/dL — ABNORMAL HIGH (ref 0.44–1.00)
GFR calc Af Amer: 49 mL/min — ABNORMAL LOW (ref >60)
GFR calc non Af Amer: 42 mL/min — ABNORMAL LOW (ref >60)
Glucose, Bld: 116 mg/dL — ABNORMAL HIGH (ref 70–99)
Potassium: 4.2 mmol/L (ref 3.5–5.1)
Sodium: 139 mmol/L (ref 135–145)
Total Bilirubin: 0.6 mg/dL (ref 0.3–1.2)
Total Protein: 6 g/dL — ABNORMAL LOW (ref 6.5–8.1)

## 2018-10-31 LAB — URINALYSIS, MICROSCOPIC (REFLEX)

## 2018-10-31 LAB — URINALYSIS, ROUTINE W REFLEX MICROSCOPIC
Bilirubin Urine: NEGATIVE
Glucose, UA: NEGATIVE mg/dL
Ketones, ur: NEGATIVE mg/dL
Leukocytes,Ua: NEGATIVE
Nitrite: NEGATIVE
Protein, ur: NEGATIVE mg/dL
Specific Gravity, Urine: >1.03 — ABNORMAL HIGH (ref 1.005–1.030)
pH: 5 (ref 5.0–8.0)

## 2018-10-31 LAB — CBC
HCT: 42.6 % (ref 36.0–46.0)
Hemoglobin: 13.1 g/dL (ref 12.0–15.0)
MCH: 30.5 pg (ref 26.0–34.0)
MCHC: 30.8 g/dL (ref 30.0–36.0)
MCV: 99.3 fL (ref 80.0–100.0)
Platelets: 81 10*3/uL — ABNORMAL LOW (ref 150–400)
RBC: 4.29 MIL/uL (ref 3.87–5.11)
RDW: 14.5 % (ref 11.5–15.5)
WBC: 4.6 10*3/uL (ref 4.0–10.5)
nRBC: 0 % (ref 0.0–0.2)

## 2018-10-31 LAB — CBG MONITORING, ED: Glucose-Capillary: 127 mg/dL — ABNORMAL HIGH (ref 70–99)

## 2018-10-31 LAB — APTT: aPTT: 31 seconds (ref 24–36)

## 2018-10-31 NOTE — Telephone Encounter (Signed)
Left message for [facility] to call back

## 2018-10-31 NOTE — ED Provider Notes (Addendum)
[facility] Provider Note: Christina Spurling, MD, FACEP  CSN: [id number] MRN: [id number] ARRIVAL: 10/31/18 at 2108 ROOM: [place]  Aphasia  Level 5 caveat: Dementia HISTORY OF PRESENT ILLNESS  10/31/18 11:03 PM [name] is a 85 y.o. female who was recently treated for streptococcal endocarditis.  She is now off antibiotics but continues to be on Eliquis for atrial fibrillation.  She has had a cognitive decline since her hospitalization in July for the infection and later for cerebrovascular accident.  She was "not right" when she went to bed for a nap about 230 today according to her daughter but it is not clear what "not right" means.  Her son states that when she awakened about 5:30 PM she was having difficulty finding the words for objects.  For example she called a [place] scissors a banana.  She continues to be confused and missed naming some, but not all, objects or ideas.  She denies pain.  She has not had a fever.   Past Medical History:  Diagnosis Date  . Anxiety   . Arthritis    "mostly my hands" (01/29/2015)  . Basal cell carcinoma of forehead   . Benign essential tremor   . CAP (community acquired pneumonia) 01/29/2015  . CHF (congestive heart failure) ([place])   . Dementia ([place]) 10/18/2018  . Depression   . Edema of both legs 04-23-2013  pt states feet swelling (wears ted hose)   bilateral venous ablation procedures done in IR, L>R  . GERD (gastroesophageal reflux disease)   . H/O hiatal hernia   . Hyperlipidemia   . Hyperparathyroidism ([place])    MILD--  STABLE  . Hypertension   . Kidney stones   . Osteopenia   . Permanent atrial fibrillation    CARDIOLOGIST-- DR [id number]  . Pneumonia "several times"  . Presence of permanent cardiac pacemaker    LAST PACER CHECK  04-23-2013  IN [facility]  . Right ureteral stone   . S/P mitral valve replacement with bioprosthetic valve    04-03-2009  . Schatzki's ring   . Sick sinus syndrome ([place])     S/P  PACEMAKER  2009  . Thrombocytopenia, immune ([place])    CHRONIC---  HEMOTOLOGIST---  DR ENNEVER  . Venous insufficiency, peripheral   . Wears glasses     Past Surgical History:  Procedure Laterality Date  . CARDIAC CATHETERIZATION  03/27/2009   normal coronaries; grade IV Mitral Insuff.  Marland Kitchen CARDIAC CATHETERIZATION  2001  &  2006  . CARDIAC VALVE REPLACEMENT    . CATARACT EXTRACTION W/ INTRAOCULAR LENS  IMPLANT, BILATERAL Bilateral ~ 2012  . CYSTOSCOPY W/ URETERAL STENT PLACEMENT Left 08/01/2012   Procedure: CYSTOSCOPY WITH RETROGRADE PYELOGRAM/URETERAL STENT PLACEMENT;  Surgeon: Dutch Gray, MD;  Location: [facility];  Service: Urology;  Laterality: Left;  . CYSTOSCOPY WITH URETEROSCOPY Left 08/14/2012   Procedure: LEFT URETEROSCOPY WITH HOLMIUM LASER AND LEFT  STENT PLACEMENT;  Surgeon: Malka So, MD;  Location: [facility];  Service: Urology;  Laterality: Left;  . CYSTOSCOPY WITH URETEROSCOPY AND STENT PLACEMENT Right 04/25/2013   Procedure: RIGHT URETEROSCOPY WITH  STENT PLACEMENT;  Surgeon: Irine Seal, MD;  Location: [facility];  Service: Urology;  Laterality: Right;  . ESOPHAGOGASTRODUODENOSCOPY (EGD) WITH ESOPHAGEAL DILATION  "a couple times" (01/29/2015)  . EXTRACORPOREAL SHOCK WAVE LITHOTRIPSY Left 07-26-2012  . HAND SURGERY Right 05/2011   "no fracture; straightened things up  . HAND SURGERY Left 2014   "  no fracture; straightened things up"  . HOLMIUM LASER APPLICATION Left [id number]   Procedure:  HOLMIUM LASER APPLICATION;  Surgeon: Malka So, MD;  Location: [facility];  Service: Urology;  Laterality: Left;  . HOLMIUM LASER APPLICATION Right [id number]   Procedure: HOLMIUM LASER APPLICATION;  Surgeon: Irine Seal, MD;  Location: [facility];  Service: Urology;  Laterality: Right;  . MITRAL VALVE REPLACEMENT WITH CHORDAL PRESEVATION USING BIOPROSTHETIC VALVE/  LIGATION LEFT ATRIAL APPENDAGE/  LEFT-SIDED MAZE PROCEDURE  04-03-2009  DR Ethel Rana,   SERIAL # [id number]  . NM MYOCAR PERF WALL MOTION  03/01/1999   mild lateral ischemia  . PERMANENT PACEMAKER INSERTION  02/26/2007   Dual Chamber Med[employer]onic Programmed VVIR--- Generator [id number]/  [id number]   . PPM GENERATOR CHANGEOUT N/A 08/20/2018   Procedure: PPM GENERATOR CHANGEOUT;  Surgeon: Sanda Klein, MD;  Location: [facility];  Service: Cardiovascular;  Laterality: N/A;  . TEE WITHOUT CARDIOVERSION N/A 09/05/2018   Procedure: TRANSESOPHAGEAL ECHOCARDIOGRAM (TEE);  Surgeon: Pixie Casino, MD;  Location: [facility]OS[facility];  Service: Cardiovascular;  Laterality: N/A;  . TRANSTHORACIC ECHOCARDIOGRAM  09-07-2012  DR CROITORU   MILD LVH/  EF 55-60%/  MILD TO MODERATE AR/   SEVERE LAE  &  RAE/  MILD TO MODERATE TR/   MITRAL VALVE BIOPROSTHESIS LEAFLET NORMAL,  MODERATE STENOSIS WITH NO SIG. REGURG/  MODERATE RVE  . VAGINAL HYSTERECTOMY  1982    Family History  Problem Relation Age of Onset  . Cancer Mother   . Heart attack Father     Social History   Tobacco Use  . Smoking status: Never Smoker  . Smokeless tobacco: Never Used  . Tobacco comment: never used tobacco  Substance Use Topics  . Alcohol use: No    Alcohol/week: 0.0 standard drinks  . Drug use: No    Prior to Admission medications   Medication Sig Start Date End Date Taking? Authorizing Provider  acetaminophen (TYLENOL) 500 MG tablet Take 500-1,000 mg by mouth daily as needed for moderate pain.    [provider]  ALPRAZolam Duanne Moron) 0.5 MG tablet Take 0.5-1 tablets (0.25-0.5 mg total) by mouth See admin instructions. Take 1/2 tablet every morning and take 1 tablet at night 09/14/18   Arrien, Jimmy Picket, MD  amLODipine-olmesartan (AZOR) 5-20 MG tablet TAKE 1 TABLET BY MOUTH EVERY DAY Patient not taking: No sig reported 08/06/18   Croitoru, Mihai, MD  apixaban (ELIQUIS) 5 MG TABS tablet Take 1 tablet (5 mg total) by mouth 2 (two) times daily. 09/14/18 10/14/18  Arrien, Jimmy Picket, MD  B Complex  Vitamins (VITAMIN B COMPLEX PO) Take 1 tablet by mouth daily.     [provider]  cholecalciferol (VITAMIN D3) 25 MCG (1000 UT) tablet Take 1,000 Units by mouth daily.    [provider]  furosemide (LASIX) 40 MG tablet Take 40 mg by mouth as directed. Every other day and as needed for weight gain    [provider]  omeprazole (PRILOSEC) 20 MG capsule Take 20 mg by mouth 2 (two) times a day.     [provider]  OVER THE COUNTER MEDICATION Take 1 tablet by mouth 2 (two) times daily. Nutrifuron (immune supplement)    [provider]  potassium chloride SA (K-DUR) 20 MEQ tablet Take 20 mEq by mouth as directed. Every other day and days you take extra Lasix    [provider]  Probiotic CAPS Take 1 capsule  by mouth daily.    [provider]  simvastatin (ZOCOR) 10 MG tablet TAKE 1 TABLET BY MOUTH EVERY DAY AT [facility] Patient taking differently: Take 10 mg by mouth at bedtime.  03/30/15   Medina-Vargas, Monina C, NP  Vit B6-Vit B12-Omega 3 Acids (VITAMIN B PLUS+ PO) Take 1 tablet by mouth daily.    [provider]    Allergies Zoloft [sertraline hcl]   REVIEW OF SYSTEMS     PHYSICAL EXAMINATION  Initial Vital Signs Blood pressure 126/68, pulse 79, temperature 97.6 F (36.4 C), temperature source Oral, resp. rate 18, SpO2 99 %.  Examination General: Well-developed, well-nourished female in no acute distress; appearance consistent with age of record HENT: normocephalic; atraumatic Eyes: pupils equal, round and reactive to light; extraocular muscles intact Neck: supple Heart: Irregular rhythm; no murmur heard Lungs: clear to auscultation bilaterally Abdomen: soft; nondistended; nontender; bowel sounds present Extremities: Ulnar deviation of fingers; pulses normal; no edema Neurologic: Awake, alert, oriented to person and place but speaks nonsense when asked what day it is; correctly refers to pen, but unable to say the  word "stethoscope" or "nose" when prompted; motor function intact in all extremities and symmetric; no facial droop Skin: Warm and dry Psychiatric: Normal mood and affect   RESULTS  Summary of this visit's results, reviewed by myself:   EKG Interpretation  Date/Time:  Wednesday October 31 2018 21:36:50 EDT Ventricular Rate:  76 PR Interval:    QRS Duration: 100 QT Interval:  354 QTC Calculation: 398 R Axis:   -49 Text Interpretation:  Atrial fibrillation Ventricular premature complex Left anterior fascicular block Consider anterior infarct Confirmed by Monigue Spraggins [phone]) on 10/31/2018 10:45:37 PM      Laboratory Studies: Results for orders placed or performed during the hospital encounter of 10/31/18 (from the past 24 hour(s))  CBG monitoring, ED     Status: Abnormal   Collection Time: 10/31/18  9:24 PM  Result Value Ref Range   Glucose-Capillary 127 (H) 70 - 99 mg/dL  Protime-INR     Status: None   Collection Time: 10/31/18 10:09 PM  Result Value Ref Range   Prothrombin Time 15.2 11.4 - 15.2 seconds   INR 1.2 0.8 - 1.2  APTT     Status: None   Collection Time: 10/31/18 10:09 PM  Result Value Ref Range   aPTT 31 24 - 36 seconds  CBC     Status: Abnormal   Collection Time: 10/31/18 10:09 PM  Result Value Ref Range   WBC 4.6 4.0 - 10.5 K/uL   RBC 4.29 3.87 - 5.11 MIL/uL   Hemoglobin 13.1 12.0 - 15.0 g/dL   HCT 42.6 36.0 - 46.0 %   MCV 99.3 80.0 - 100.0 fL   MCH 30.5 26.0 - 34.0 pg   MCHC 30.8 30.0 - 36.0 g/dL   RDW 14.5 11.5 - 15.5 %   Platelets 81 (L) 150 - 400 K/uL   nRBC 0.0 0.0 - 0.2 %  Differential     Status: None   Collection Time: 10/31/18 10:09 PM  Result Value Ref Range   Neutrophils Relative % 48 %   Neutro Abs 2.2 1.7 - 7.7 K/uL   Lymphocytes Relative 33 %   Lymphs Abs 1.5 0.7 - 4.0 K/uL   Monocytes Relative 14 %   Monocytes Absolute 0.6 0.1 - 1.0 K/uL   Eosinophils Relative 3 %   Eosinophils Absolute 0.1 0.0 - 0.5 K/uL   Basophils Relative 1 %  Basophils Absolute 0.1 0.0 - 0.1 K/uL   WBC Morphology MORPHOLOGY UNREMARKABLE    RBC Morphology MORPHOLOGY UNREMARKABLE    Smear Review Normal platelet morphology    Immature Granulocytes 1 %   Abs Immature Granulocytes 0.03 0.00 - 0.07 K/uL  Comprehensive metabolic panel     Status: Abnormal   Collection Time: 10/31/18 10:09 PM  Result Value Ref Range   Sodium 139 135 - 145 mmol/L   Potassium 4.2 3.5 - 5.1 mmol/L   Chloride 105 98 - 111 mmol/L   CO2 27 22 - 32 mmol/L   Glucose, Bld 116 (H) 70 - 99 mg/dL   BUN 27 (H) 8 - 23 mg/dL   Creatinine, Ser 1.16 (H) 0.44 - 1.00 mg/dL   Calcium 10.6 (H) 8.9 - 10.3 mg/dL   Total Protein 6.0 (L) 6.5 - 8.1 g/dL   Albumin 3.7 3.5 - 5.0 g/dL   AST 20 15 - 41 U/L   ALT 13 0 - 44 U/L   Alkaline Phosphatase 40 38 - 126 U/L   Total Bilirubin 0.6 0.3 - 1.2 mg/dL   GFR calc non Af Amer 42 (L) >60 mL/min   GFR calc Af Amer 49 (L) >60 mL/min   Anion gap 7 5 - 15  Urinalysis, Routine w reflex microscopic- may I&O cath if menses     Status: Abnormal   Collection Time: 10/31/18 11:11 PM  Result Value Ref Range   Color, Urine YELLOW YELLOW   APPearance HAZY (A) CLEAR   Specific Gravity, Urine >1.030 (H) 1.005 - 1.030   pH 5.0 5.0 - 8.0   Glucose, UA NEGATIVE NEGATIVE mg/dL   Hgb urine dipstick TRACE (A) NEGATIVE   Bilirubin Urine NEGATIVE NEGATIVE   Ketones, ur NEGATIVE NEGATIVE mg/dL   Protein, ur NEGATIVE NEGATIVE mg/dL   Nitrite NEGATIVE NEGATIVE   Leukocytes,Ua NEGATIVE NEGATIVE  Urinalysis, Microscopic (reflex)     Status: Abnormal   Collection Time: 10/31/18 11:11 PM  Result Value Ref Range   RBC / HPF 0-5 0 - 5 RBC/hpf   WBC, UA 0-5 0 - 5 WBC/hpf   Bacteria, UA FEW (A) NONE SEEN   Squamous Epithelial / LPF 6-10 0 - 5   Ca Oxalate Crys, UA PRESENT    Imaging Studies: Ct Head Wo Contrast  Result Date: 10/31/2018 CLINICAL DATA:  Altered mental status, concern for stroke EXAM: CT HEAD WITHOUT CONTRAST TECHNIQUE: Contiguous axial images  were obtained from the base of the skull through the vertex without intravenous contrast. COMPARISON:  CT 09/12/2018 FINDINGS: Brain: Subcortical regions of hypoattenuation in the superior frontal gyri bilaterally are similar to prior. Expected evolution of the gliotic changes in the left middle frontal gyrus at the site seen on prior comparison CT. No evidence of acute infarction, hemorrhage, hydrocephalus, extra-axial collection or mass lesion/mass effect. Symmetric prominence of the ventricles, cisterns and sulci compatible with parenchymal volume loss. Patchy areas of white matter hypoattenuation are most compatible with chronic microvascular angiopathy. Vascular: Atherosclerotic calcification of the carotid siphons and intradural vertebral arteries. No hyperdense vessel. Skull: No calvarial fracture or suspicious osseous lesion. No scalp swelling or hematoma. Sinuses/Orbits: Paranasal sinuses and mastoid air cells are predominantly clear. Included orbital structures are unremarkable. Other: None IMPRESSION: 1. No acute intracranial abnormality. If there is persisting clinical concern for acute infarct, MRI could be obtained. 2. Expected evolution of the gliotic changes in the left middle frontal gyrus at the site seen on prior comparison CT. 3. Stable parenchymal volume  loss and chronic microvascular ischemic white matter disease. Electronically Signed   By: Lovena Le M.D.   On: 10/31/2018 21:55    ED COURSE and MDM  Nursing notes and initial vitals signs, including pulse oximetry, reviewed.  Vitals:   11/01/18 0322 11/01/18 0400 11/01/18 0530 11/01/18 0600  BP: (!) 109/56 116/63 (!) 103/53 106/60  Pulse: 64 65 (!) 59 63  Resp:  18 16 16   Temp:      TempSrc:      SpO2: 90% 98% 98% 97%   Dr. Maudie Mercury accepts for transfer to [facility] hospitalist service.  Suspect stroke is subacute but recent given lack of acute changes on CT scan.  She did not fit into the Code Stroke window.  PROCEDURES     ED DIAGNOSES     ICD-10-CM   1. Aphasia due to recent stroke  I69.320   2. Cerebrovascular accident (CVA), unspecified mechanism ([place])  I63.9        Hiran Leard, MD 11/01/18 0351    Shanon Rosser, MD 11/01/18 [phone]

## 2018-10-31 NOTE — ED Notes (Signed)
Patient transported to CT 

## 2018-10-31 NOTE — ED Triage Notes (Addendum)
Per son pt with slurred speech and fatigue when she woke from a nap at 530pm-sister was with pt when nap started ~230pm and "didn't seem right"-he is unsure what that means-pt is alert to name, age and place-she denies pain-speech is slow/slurred-NAD-to triage in w/c

## 2018-11-01 ENCOUNTER — Encounter (HOSPITAL_COMMUNITY): Payer: Self-pay | Admitting: Internal Medicine

## 2018-11-01 DIAGNOSIS — R2689 Other abnormalities of gait and mobility: Secondary | ICD-10-CM | POA: Diagnosis not present

## 2018-11-01 DIAGNOSIS — I4821 Permanent atrial fibrillation: Secondary | ICD-10-CM | POA: Diagnosis not present

## 2018-11-01 DIAGNOSIS — I509 Heart failure, unspecified: Secondary | ICD-10-CM | POA: Diagnosis not present

## 2018-11-01 DIAGNOSIS — Z85828 Personal history of other malignant neoplasm of skin: Secondary | ICD-10-CM | POA: Diagnosis not present

## 2018-11-01 DIAGNOSIS — G309 Alzheimer's disease, unspecified: Secondary | ICD-10-CM | POA: Diagnosis not present

## 2018-11-01 DIAGNOSIS — D696 Thrombocytopenia, unspecified: Secondary | ICD-10-CM | POA: Diagnosis not present

## 2018-11-01 DIAGNOSIS — F0281 Dementia in other diseases classified elsewhere with behavioral disturbance: Secondary | ICD-10-CM | POA: Diagnosis not present

## 2018-11-01 DIAGNOSIS — N183 Chronic kidney disease, stage 3 (moderate): Secondary | ICD-10-CM | POA: Diagnosis not present

## 2018-11-01 DIAGNOSIS — R2990 Unspecified symptoms and signs involving the nervous system: Secondary | ICD-10-CM | POA: Diagnosis not present

## 2018-11-01 DIAGNOSIS — R4701 Aphasia: Secondary | ICD-10-CM | POA: Diagnosis present

## 2018-11-01 DIAGNOSIS — G308 Other Alzheimer's disease: Secondary | ICD-10-CM | POA: Diagnosis not present

## 2018-11-01 DIAGNOSIS — I69320 Aphasia following cerebral infarction: Secondary | ICD-10-CM | POA: Diagnosis not present

## 2018-11-01 DIAGNOSIS — Z23 Encounter for immunization: Secondary | ICD-10-CM | POA: Diagnosis not present

## 2018-11-01 DIAGNOSIS — Z79899 Other long term (current) drug therapy: Secondary | ICD-10-CM | POA: Diagnosis not present

## 2018-11-01 DIAGNOSIS — Z952 Presence of prosthetic heart valve: Secondary | ICD-10-CM | POA: Diagnosis not present

## 2018-11-01 DIAGNOSIS — Z20828 Contact with and (suspected) exposure to other viral communicable diseases: Secondary | ICD-10-CM | POA: Diagnosis not present

## 2018-11-01 DIAGNOSIS — I130 Hypertensive heart and chronic kidney disease with heart failure and stage 1 through stage 4 chronic kidney disease, or unspecified chronic kidney disease: Secondary | ICD-10-CM | POA: Diagnosis not present

## 2018-11-01 DIAGNOSIS — F419 Anxiety disorder, unspecified: Secondary | ICD-10-CM | POA: Diagnosis not present

## 2018-11-01 DIAGNOSIS — Z950 Presence of cardiac pacemaker: Secondary | ICD-10-CM | POA: Diagnosis not present

## 2018-11-01 LAB — CREATININE, SERUM
Creatinine, Ser: 0.99 mg/dL (ref 0.44–1.00)
GFR calc Af Amer: 59 mL/min — ABNORMAL LOW (ref >60)
GFR calc non Af Amer: 51 mL/min — ABNORMAL LOW (ref >60)

## 2018-11-01 LAB — CBC
HCT: 44.8 % (ref 36.0–46.0)
Hemoglobin: 14.6 g/dL (ref 12.0–15.0)
MCH: 31.9 pg (ref 26.0–34.0)
MCHC: 32.6 g/dL (ref 30.0–36.0)
MCV: 98 fL (ref 80.0–100.0)
Platelets: UNDETERMINED 10*3/uL (ref 150–400)
RBC: 4.57 MIL/uL (ref 3.87–5.11)
RDW: 14.2 % (ref 11.5–15.5)
WBC: 5.2 10*3/uL (ref 4.0–10.5)
nRBC: 0 % (ref 0.0–0.2)

## 2018-11-01 LAB — SARS CORONAVIRUS 2 (TAT 6-24 HRS): SARS Coronavirus 2: NEGATIVE

## 2018-11-01 MED ORDER — ACETAMINOPHEN 325 MG PO TABS
650.0000 mg | ORAL_TABLET | ORAL | Status: DC | PRN
  Administered 2018-11-01 – 2018-11-02 (×2): 650 mg via ORAL
  Filled 2018-11-01 (×2): qty 2

## 2018-11-01 MED ORDER — INFLUENZA VAC A&B SA ADJ QUAD 0.5 ML IM PRSY
0.5000 mL | PREFILLED_SYRINGE | INTRAMUSCULAR | Status: AC
  Administered 2018-11-02: 13:00:00 0.5 mL via INTRAMUSCULAR
  Filled 2018-11-01 (×2): qty 0.5

## 2018-11-01 MED ORDER — ALPRAZOLAM 0.25 MG PO TABS
0.2500 mg | ORAL_TABLET | Freq: Every morning | ORAL | Status: DC
  Administered 2018-11-02: 0.25 mg via ORAL
  Filled 2018-11-01: qty 1

## 2018-11-01 MED ORDER — ACETAMINOPHEN 160 MG/5ML PO SOLN
650.0000 mg | Freq: Once | ORAL | Status: AC
  Administered 2018-11-01: 650 mg via ORAL
  Filled 2018-11-01: qty 20.3

## 2018-11-01 MED ORDER — ACETAMINOPHEN 500 MG PO TABS: 500.0000 mg | ORAL_TABLET | Freq: Every day | ORAL | Status: DC | PRN

## 2018-11-01 MED ORDER — PANTOPRAZOLE SODIUM 40 MG PO TBEC
40.0000 mg | DELAYED_RELEASE_TABLET | Freq: Every day | ORAL | Status: DC
  Administered 2018-11-01 – 2018-11-02 (×2): 40 mg via ORAL
  Filled 2018-11-01 (×2): qty 1

## 2018-11-01 MED ORDER — ALPRAZOLAM 0.25 MG PO TABS: 0.2500 mg | ORAL_TABLET | ORAL | Status: DC

## 2018-11-01 MED ORDER — STROKE: EARLY STAGES OF RECOVERY BOOK
Freq: Once | Status: AC
  Administered 2018-11-01: 18:00:00
  Filled 2018-11-01: qty 1

## 2018-11-01 MED ORDER — ACETAMINOPHEN 650 MG RE SUPP: 650.0000 mg | RECTAL | Status: DC | PRN

## 2018-11-01 MED ORDER — ASPIRIN 81 MG PO CHEW
81.0000 mg | CHEWABLE_TABLET | Freq: Every day | ORAL | Status: DC
  Administered 2018-11-01 – 2018-11-02 (×2): 81 mg via ORAL
  Filled 2018-11-01 (×2): qty 1

## 2018-11-01 MED ORDER — SENNOSIDES-DOCUSATE SODIUM 8.6-50 MG PO TABS: 1.0000 | ORAL_TABLET | Freq: Every evening | ORAL | Status: DC | PRN

## 2018-11-01 MED ORDER — ALPRAZOLAM 0.5 MG PO TABS
0.5000 mg | ORAL_TABLET | Freq: Every day | ORAL | Status: DC
  Administered 2018-11-01: 22:00:00 0.5 mg via ORAL
  Filled 2018-11-01: qty 1

## 2018-11-01 MED ORDER — APIXABAN 5 MG PO TABS
5.0000 mg | ORAL_TABLET | Freq: Two times a day (BID) | ORAL | Status: DC
  Administered 2018-11-01 – 2018-11-02 (×3): 5 mg via ORAL
  Filled 2018-11-01: qty 1
  Filled 2018-11-01: qty 2
  Filled 2018-11-01: qty 1

## 2018-11-01 MED ORDER — ATORVASTATIN CALCIUM 80 MG PO TABS
80.0000 mg | ORAL_TABLET | Freq: Every day | ORAL | Status: DC
  Administered 2018-11-01 – 2018-11-02 (×2): 80 mg via ORAL
  Filled 2018-11-01 (×2): qty 1

## 2018-11-01 MED ORDER — ACETAMINOPHEN 160 MG/5ML PO SOLN: 650.0000 mg | ORAL | Status: DC | PRN

## 2018-11-01 MED ORDER — ALPRAZOLAM 0.5 MG PO TABS
0.2500 mg | ORAL_TABLET | Freq: Once | ORAL | Status: AC
  Administered 2018-11-01: 0.25 mg via ORAL
  Filled 2018-11-01: qty 1

## 2018-11-01 NOTE — ED Provider Notes (Signed)
85 year old female with history of A. fib on Eliquis, pacemaker.  Here overnight for aphasia.  Admitted to [facility] but waiting for a bed assignment.  I was asked by the nurse to review 2 EKGs as the patient seemed to have a rhythm change.  Initial EKG does show a widened QRS with possible ST elevations.  Patient has no complaints.  We repeated the EKG which shows likely a paced EKG with some PVCs.  Doubt slow V. tach because the patient is got a heart rate of 73.  Likely paced with difficult to appreciate pacer spikes.  We will continue to monitor.   Hayden Rasmussen, MD 11/01/18 [phone]

## 2018-11-01 NOTE — ED Notes (Signed)
ST elevation noted on cardiac monitor, EKG obtained and given to ED MD, denies chest pain

## 2018-11-01 NOTE — Plan of Care (Signed)
  Problem: Clinical Measurements: Goal: Ability to maintain clinical measurements within normal limits will improve Outcome: Progressing   Problem: Safety: Goal: Ability to remain free from injury will improve Outcome: Progressing   

## 2018-11-01 NOTE — ED Notes (Signed)
Apple juice given.  Patient tolerated it well.

## 2018-11-01 NOTE — Telephone Encounter (Signed)
Follow Up

## 2018-11-01 NOTE — Progress Notes (Signed)
Called daughter to complete admission profile. Left daughter message to return call to facility.

## 2018-11-01 NOTE — ED Notes (Addendum)
Note entered on wrong patient. 

## 2018-11-01 NOTE — Consult Note (Signed)
Requesting Physician: Dr. Doristine Bosworth    Chief Complaint: Slurred speech  History obtained from: Patient and Chart     HPI:                                                                                                                                       [name] is a 85 y.o. female with past medical history significant for atrial fibrillation on Eliquis,  hypertension, hyperlipidemia, sick sinus syndrome post pacemaker, congestive heart failure, CKD stage III, GERD, essential tremors, basal cell carcinoma, history of strep endocarditis presented to ED at [facility] yesterday after her daughter noticed patient was having difficulty with words and slurred speech.  The patient was evaluated by EDP at bedside at [facility], she did not receive IV TPA as she was on Eliquis.  She was transferred to [facility] for further management and neurology consultation.  On arrival to [facility] patient is doing much better and no longer having significant word finding difficulty.  Date last known well: 9.16-20  tPA Given: No, outside window  Baseline MRS 0     Past Medical History:  Diagnosis Date  . Anxiety   . Arthritis    "mostly my hands" (01/29/2015)  . Basal cell carcinoma of forehead   . Benign essential tremor   . CAP (community acquired pneumonia) 01/29/2015  . CHF (congestive heart failure) ([place])   . Dementia ([place]) 10/18/2018  . Depression   . Edema of both legs 04-23-2013  pt states feet swelling (wears ted hose)   bilateral venous ablation procedures done in IR, L>R  . GERD (gastroesophageal reflux disease)   . H/O hiatal hernia   . Hyperlipidemia   . Hyperparathyroidism ([place])    MILD--  STABLE  . Hypertension   . Kidney stones   . Osteopenia   . Permanent atrial fibrillation    CARDIOLOGIST-- DR [id number]  . Pneumonia "several times"  . Presence of permanent cardiac pacemaker    LAST PACER CHECK  04-23-2013  IN EPIC  . Right ureteral stone   . S/P  mitral valve replacement with bioprosthetic valve    04-03-2009  . Schatzki's ring   . Sick sinus syndrome ([place])    S/P  PACEMAKER  2009  . Stroke ([place]) 08/2018  . Stroke ([place])   . Thrombocytopenia, immune ([place])    CHRONIC---  HEMOTOLOGIST---  DR ENNEVER  . Venous insufficiency, peripheral   . Wears glasses     Past Surgical History:  Procedure Laterality Date  . CARDIAC CATHETERIZATION  03/27/2009   normal coronaries; grade IV Mitral Insuff.  Marland Kitchen CARDIAC CATHETERIZATION  2001  &  2006  . CARDIAC VALVE REPLACEMENT    . CATARACT EXTRACTION W/ INTRAOCULAR LENS  IMPLANT, BILATERAL Bilateral ~ 2012  . CYSTOSCOPY W/ URETERAL STENT PLACEMENT Left 08/01/2012   Procedure: CYSTOSCOPY WITH RETROGRADE PYELOGRAM/URETERAL STENT  PLACEMENT;  Surgeon: Dutch Gray, MD;  Location: [facility];  Service: Urology;  Laterality: Left;  . CYSTOSCOPY WITH URETEROSCOPY Left 08/14/2012   Procedure: LEFT URETEROSCOPY WITH HOLMIUM LASER AND LEFT  STENT PLACEMENT;  Surgeon: Malka So, MD;  Location: [facility];  Service: Urology;  Laterality: Left;  . CYSTOSCOPY WITH URETEROSCOPY AND STENT PLACEMENT Right 04/25/2013   Procedure: RIGHT URETEROSCOPY WITH  STENT PLACEMENT;  Surgeon: Irine Seal, MD;  Location: [facility];  Service: Urology;  Laterality: Right;  . ESOPHAGOGASTRODUODENOSCOPY (EGD) WITH ESOPHAGEAL DILATION  "a couple times" (01/29/2015)  . EXTRACORPOREAL SHOCK WAVE LITHOTRIPSY Left 07-26-2012  . HAND SURGERY Right 05/2011   "no fracture; straightened things up  . HAND SURGERY Left 2014   "no fracture; straightened things up"  . HOLMIUM LASER APPLICATION Left [id number]   Procedure:  HOLMIUM LASER APPLICATION;  Surgeon: Malka So, MD;  Location: [facility];  Service: Urology;  Laterality: Left;  . HOLMIUM LASER APPLICATION Right [id number]   Procedure: HOLMIUM LASER APPLICATION;  Surgeon: Irine Seal, MD;  Location: [facility];  Service: Urology;  Laterality: Right;  . MITRAL VALVE  REPLACEMENT WITH CHORDAL PRESEVATION USING BIOPROSTHETIC VALVE/  LIGATION LEFT ATRIAL APPENDAGE/  LEFT-SIDED MAZE PROCEDURE  04-03-2009  DR Ethel Rana,  SERIAL # [id number]  . NM MYOCAR PERF WALL MOTION  03/01/1999   mild lateral ischemia  . PERMANENT PACEMAKER INSERTION  02/26/2007   Dual Chamber [employer] Programmed VVIR--- Generator [id number]  [id number]   . PPM GENERATOR CHANGEOUT N/A 08/20/2018   Procedure: PPM GENERATOR CHANGEOUT;  Surgeon: Sanda Klein, MD;  Location: [place] [facility];  Service: Cardiovascular;  Laterality: N/A;  . TEE WITHOUT CARDIOVERSION N/A 09/05/2018   Procedure: TRANSESOPHAGEAL ECHOCARDIOGRAM (TEE);  Surgeon: Pixie Casino, MD;  Location: [facility];  Service: Cardiovascular;  Laterality: N/A;  . TRANSTHORACIC ECHOCARDIOGRAM  09-07-2012  DR CROITORU   MILD LVH/  EF 55-60%/  MILD TO MODERATE AR/   SEVERE LAE  &  RAE/  MILD TO MODERATE TR/   MITRAL VALVE BIOPROSTHESIS LEAFLET NORMAL,  MODERATE STENOSIS WITH NO SIG. REGURG/  MODERATE RVE  . VAGINAL HYSTERECTOMY  1982    Family History  Problem Relation Age of Onset  . Cancer Mother   . Heart attack Father    Social History:  reports that she has never smoked. She has never used smokeless tobacco. She reports that she does not drink alcohol or use drugs.  Allergies:  Allergies  Allergen Reactions  . Zoloft [Sertraline Hcl] Other (See Comments)    Hallucinations, hearing things; per daughter "made her have a trip"    Medications:                                                                                                                        I reviewed home medications   ROS:  14 systems reviewed and negative except above    Examination:                                                                                                      General: Appears  well-developed  Psych: Affect appropriate to situation Eyes: No scleral injection HENT: No OP obstrucion Head: Normocephalic.  Cardiovascular: regular rate and rhythm Respiratory: Effort normal and breath sounds normal to anterior ascultation GI: Soft.  No distension. There is no tenderness.  Skin: WDI    Neurological Examination Mental Status: Alert, oriented to place and month, thought content appropriate.  Speech" mild word findng difficulty, naming with with slightly impaired repitition. Able to follow 3 step commands without difficulty. Cranial Nerves: II: Visual fields grossly normal,  III,IV, VI: ptosis not present, extra-ocular motions intact bilaterally, pupils equal, round, reactive to light and accommodation V,VII: smile symmetric, facial light touch sensation normal bilaterally VIII: hearing normal bilaterally IX,X: uvula rises symmetrically XI: bilateral shoulder shrug XII: midline tongue extension Motor: Right : Upper extremity   5/5    Left:     Upper extremity   5/5  Lower extremity   5/5     Lower extremity   5/5 Tone and bulk:normal tone throughout; no atrophy noted Sensory: Pinprick and light touch intact throughout, bilaterally Plantars: Right: downgoing   Left: downgoing Cerebellar: normal finger-to-nose, normal rapid alternating movements and normal heel-to-shin test Gait: Not assessed due to safety     Lab Results: Basic Metabolic Panel: Recent Labs  Lab 10/31/18 05/12/07 11/01/18 1640  NA 139  --   K 4.2  --   CL 105  --   CO2 27  --   GLUCOSE 116*  --   BUN 27*  --   CREATININE 1.16* 0.99  CALCIUM 10.6*  --     CBC: Recent Labs  Lab 10/31/18 05-12-2207 11/01/18 1640  WBC 4.6 5.2  NEUTROABS 2.2  --   HGB 13.1 14.6  HCT 42.6 44.8  MCV 99.3 98.0  PLT 81* PLATELET CLUMPS NOTED ON SMEAR, UNABLE TO ESTIMATE    Coagulation Studies: Recent Labs    10/31/18 05-12-2207  LABPROT 15.2  INR 1.2    Imaging: Ct Head Wo Contrast  Result Date:  10/31/2018 CLINICAL DATA:  Altered mental status, concern for stroke EXAM: CT HEAD WITHOUT CONTRAST TECHNIQUE: Contiguous axial images were obtained from the base of the skull through the vertex without intravenous contrast. COMPARISON:  CT 09/12/2018 FINDINGS: Brain: Subcortical regions of hypoattenuation in the superior frontal gyri bilaterally are similar to prior. Expected evolution of the gliotic changes in the left middle frontal gyrus at the site seen on prior comparison CT. No evidence of acute infarction, hemorrhage, hydrocephalus, extra-axial collection or mass lesion/mass effect. Symmetric prominence of the ventricles, cisterns and sulci compatible with parenchymal volume loss. Patchy areas of white matter hypoattenuation are most compatible with chronic microvascular angiopathy. Vascular: Atherosclerotic calcification of the carotid siphons and intradural vertebral arteries. No hyperdense vessel. Skull: No calvarial fracture or suspicious osseous lesion. No scalp swelling or hematoma. Sinuses/Orbits:  Paranasal sinuses and mastoid air cells are predominantly clear. Included orbital structures are unremarkable. Other: None IMPRESSION: 1. No acute intracranial abnormality. If there is persisting clinical concern for acute infarct, MRI could be obtained. 2. Expected evolution of the gliotic changes in the left middle frontal gyrus at the site seen on prior comparison CT. 3. Stable parenchymal volume loss and chronic microvascular ischemic white matter disease. Electronically Signed   By: Lovena Le M.D.   On: 10/31/2018 21:55     ASSESSMENT AND PLAN  85 y.o. female with past medical history significant for atrial fibrillation on Eliquis,  hypertension, hyperlipidemia, sick sinus syndrome post pacemaker, congestive heart failure, CKD stage III, GERD, essential tremors, basal cell carcinoma, history of strep endocarditis presented to ED at [facility] yesterday after her daughter noticed  patient was having difficulty with words and slurred speech. Etiology likely cardioembolic, however would continue Eliquis for now.    Suspect Mild Ischemic stroke  Recommendations Repeat CT head in 24 hours to rule out large infarct (unable to perform MRI brain due to pacemaker) -Carotid Dopplers -Has recent echocardiogram, did not need to repeat as patient will continue to remain on anticoagulation will not actively change management -Continue Eliquis for now   Stroke team will follow     [facility] Pager Number [phone]

## 2018-11-01 NOTE — Telephone Encounter (Signed)
Spoke with Helene Kelp who is not a medical person. Explained needed copy of MAR and if patient was getting [facility] while in facility. She stated she would send email to Shellee Milo, supervisor Kathreen Cornfield discharge coordinator and De Nurse in medical records  Fax number given

## 2018-11-01 NOTE — ED Notes (Signed)
Rosalyn Gess, patient's daughter, notified of patient's room assignment.  I will call her back once ambulance comes to pick her up.

## 2018-11-01 NOTE — ED Notes (Signed)
Mr. [name] called and inquired if patient was transported to [facility].  Informed him that when the ambulance comes, I will give [name], his sister, as call.

## 2018-11-01 NOTE — ED Notes (Signed)
Mrs. Rosalyn Gess notified of patient's leaving [facility] via [facility].

## 2018-11-01 NOTE — H&P (Signed)
History and Physical    [name] [id number] DOB: Jul 09, 1929 DOA: 10/31/2018  PCP: Lawerance Cruel, MD  Patient coming [facility]: [facility] [place] I have personally briefly reviewed patient's old medical records in [place]  Chief Complaint: Altered mental status and slurred speech  HPI: [name] is a 85 y.o. female with medical history significant of hypertension, paroxysmal A. Fib-on Eliquis, sick sinus syndrome status post PPM, hyperlipidemia, chronic diastolic congestive heart failure, CKD stage III, GERD, essential tremors, basal cell carcinoma of forehead, mitral valve regurgitation, strep endocarditis (finished antibiotics course for 6 weeks) is a direct admit from [facility].  As per note her daughter noted that when she awakened about 5:30 PM she was having difficulty finding the words and was not like herself.  She took her to the emergency department for further evaluation and management.  CT head without contrast was obtained which came back negative for acute findings.  Patient was transferred to [facility] for further management.  Upon evaluation: Patient resting comfortably on the bed, alert and oriented x3, communicating well, reports that she is doing well, denies any symptoms such as headache, blurry vision, chest pain, shortness of breath, palpitation, leg swelling, lightheadedness, dizziness, numbness tingling sensation in extremities, nausea, vomiting, fever, chills, abdominal pain, urinary or bowel changes.  Reports that she is doing well and at her baseline.   Review of Systems: As per HPI otherwise negative.    Past Medical History:  Diagnosis Date   Anxiety    Arthritis    "mostly my hands" (01/29/2015)   Basal cell carcinoma of forehead    Benign essential tremor    CAP (community acquired pneumonia) 01/29/2015   CHF (congestive heart failure) ([place])    Dementia ([place]) 10/18/2018   Depression    Edema of both legs  04-23-2013  pt states feet swelling (wears ted hose)   bilateral venous ablation procedures done in IR, L>R   GERD (gastroesophageal reflux disease)    H/O hiatal hernia    Hyperlipidemia    Hyperparathyroidism ([place])    MILD--  STABLE   Hypertension    Kidney stones    Osteopenia    Permanent atrial fibrillation    CARDIOLOGIST-- DR [id number]   Pneumonia "several times"   Presence of permanent cardiac pacemaker    LAST PACER CHECK  04-23-2013  IN EPIC   Right ureteral stone    S/P mitral valve replacement with bioprosthetic valve    04-03-2009   Schatzki's ring    Sick sinus syndrome ([place])    S/P  PACEMAKER  2009   Stroke ([place]) 08/2018   Stroke ([employer])    Thrombocytopenia, immune ([place])    CHRONIC---  HEMOTOLOGIST---  DR ENNEVER   Venous insufficiency, peripheral    Wears glasses     Past Surgical History:  Procedure Laterality Date   CARDIAC CATHETERIZATION  03/27/2009   normal coronaries; grade IV Mitral Insuff.   CARDIAC CATHETERIZATION  2001  &  2006   CARDIAC VALVE REPLACEMENT     CATARACT EXTRACTION W/ INTRAOCULAR LENS  IMPLANT, BILATERAL Bilateral ~ 2012   CYSTOSCOPY W/ URETERAL STENT PLACEMENT Left 08/01/2012   Procedure: CYSTOSCOPY WITH RETROGRADE PYELOGRAM/URETERAL STENT PLACEMENT;  Surgeon: Dutch Gray, MD;  Location: [facility];  Service: Urology;  Laterality: Left;   CYSTOSCOPY WITH URETEROSCOPY Left 08/14/2012   Procedure: LEFT URETEROSCOPY WITH HOLMIUM LASER AND LEFT  STENT PLACEMENT;  Surgeon: Malka So, MD;  Location: [facility];  Service: Urology;  Laterality: Left;   CYSTOSCOPY WITH URETEROSCOPY AND STENT PLACEMENT Right 04/25/2013   Procedure: RIGHT URETEROSCOPY WITH  STENT PLACEMENT;  Surgeon: Irine Seal, MD;  Location: [facility];  Service: Urology;  Laterality: Right;   ESOPHAGOGASTRODUODENOSCOPY (EGD) WITH ESOPHAGEAL DILATION  "a couple times" (01/29/2015)   EXTRACORPOREAL SHOCK WAVE LITHOTRIPSY Left 07-26-2012   HAND  SURGERY Right 05/2011   "no fracture; straightened things up   HAND SURGERY Left 2014   "no fracture; straightened things up"   HOLMIUM LASER APPLICATION Left [id number]   Procedure:  HOLMIUM LASER APPLICATION;  Surgeon: Malka So, MD;  Location: [facility];  Service: Urology;  Laterality: Left;   HOLMIUM LASER APPLICATION Right [id number]   Procedure: HOLMIUM LASER APPLICATION;  Surgeon: Irine Seal, MD;  [facility];  Service: Urology;  Laterality: Right;   MITRAL VALVE REPLACEMENT WITH CHORDAL PRESEVATION USING BIOPROSTHETIC VALVE/  LIGATION LEFT ATRIAL APPENDAGE/  LEFT-SIDED MAZE PROCEDURE  04-03-2009  DR Ethel Rana,  SERIAL # [id number]   NM MYOCAR PERF WALL MOTION  03/01/1999   mild lateral ischemia   PERMANENT PACEMAKER INSERTION  02/26/2007   Dual Chamber Medtronic Programmed VVIR--- Generator [id number]/  [id number] H    PPM GENERATOR CHANGEOUT N/A 08/20/2018   Procedure: PPM GENERATOR CHANGEOUT;  Surgeon: Sanda Klein, MD;  Location: [facility];  Service: Cardiovascular;  Laterality: N/A;   TEE WITHOUT CARDIOVERSION N/A 09/05/2018   Procedure: TRANSESOPHAGEAL ECHOCARDIOGRAM (TEE);  Surgeon: Pixie Casino, MD;  Location[facility];  Service: Cardiovascular;  Laterality: N/A;   TRANSTHORACIC ECHOCARDIOGRAM  09-07-2012  DR CROITORU   MILD LVH/  EF 55-60%/  MILD TO MODERATE AR/   SEVERE LAE  &  RAE/  MILD TO MODERATE TR/   MITRAL VALVE BIOPROSTHESIS LEAFLET NORMAL,  MODERATE STENOSIS WITH NO SIG. REGURG/  MODERATE RVE   VAGINAL HYSTERECTOMY  1982     reports that she has never smoked. She has never used smokeless tobacco. She reports that she does not drink alcohol or use drugs.  Allergies  Allergen Reactions   Zoloft [Sertraline Hcl]     Family History  Problem Relation Age of Onset   Cancer Mother    Heart attack Father     Prior to Admission medications   Medication Sig Start Date End Date Taking? Authorizing Provider    acetaminophen (TYLENOL) 500 MG tablet Take 500-1,000 mg by mouth daily as needed for moderate pain.    [provider]  ALPRAZolam Duanne Moron) 0.5 MG tablet Take 0.5-1 tablets (0.25-0.5 mg total) by mouth See admin instructions. Take 1/2 tablet every morning and take 1 tablet at night 09/14/18   Arrien, Jimmy Picket, MD  amLODipine-olmesartan (AZOR) 5-20 MG tablet TAKE 1 TABLET BY MOUTH EVERY DAY Patient not taking: No sig reported 08/06/18   Croitoru, Mihai, MD  apixaban (ELIQUIS) 5 MG TABS tablet Take 1 tablet (5 mg total) by mouth 2 (two) times daily. 09/14/18 10/14/18  Arrien, Jimmy Picket, MD  B Complex Vitamins (VITAMIN B COMPLEX PO) Take 1 tablet by mouth daily.     [provider]  cholecalciferol (VITAMIN D3) 25 MCG (1000 UT) tablet Take 1,000 Units by mouth daily.    [provider]  furosemide (LASIX) 40 MG tablet Take 40 mg by mouth as directed. Every other day and as needed for weight gain    [provider]  omeprazole (PRILOSEC) 20 MG capsule  Take 20 mg by mouth 2 (two) times a day.     [provider]  OVER THE COUNTER MEDICATION Take 1 tablet by mouth 2 (two) times daily. Nutrifuron (immune supplement)    [provider]  potassium chloride SA (K-DUR) 20 MEQ tablet Take 20 mEq by mouth as directed. Every other day and days you take extra Lasix    [provider]  Probiotic CAPS Take 1 capsule by mouth daily.    [provider]  simvastatin (ZOCOR) 10 MG tablet TAKE 1 TABLET BY MOUTH EVERY DAY AT [facility] Patient taking differently: Take 10 mg by mouth at bedtime.  03/30/15   Medina-Vargas, Monina C, NP  Vit B6-Vit B12-Omega 3 Acids (VITAMIN B PLUS+ PO) Take 1 tablet by mouth daily.    [provider]    Physical Exam: Vitals:   11/01/18 1313 11/01/18 1330 11/01/18 1400 11/01/18 1532  BP: (!) 135/91 128/61 133/69 (!) 112/99  Pulse: 77 75 71 83  Resp: 13 17 20 16   Temp:    99.4 F (37.4 C)  TempSrc:     Oral  SpO2: 99% 98% 95% 100%  Weight:    72.2 kg  Height:    5\' 3"  (1.6 m)    Constitutional: NAD, calm, comfortable Vitals:   11/01/18 1313 11/01/18 1330 11/01/18 1400 11/01/18 1532  BP: (!) 135/91 128/61 133/69 (!) 112/99  Pulse: 77 75 71 83  Resp: 13 17 20 16   Temp:    99.4 F (37.4 C)  TempSrc:    Oral  SpO2: 99% 98% 95% 100%  Weight:    72.2 kg  Height:    5\' 3"  (1.6 m)   Constitutional: Alert and oriented x3, communicating well, not in acute distress.  Eyes: PERRL, lids and conjunctivae normal ENMT: Mucous membranes are moist. Posterior pharynx clear of any exudate or lesions.Normal dentition.  Neck: normal, supple, no masses, no thyromegaly Respiratory: clear to auscultation bilaterally, no wheezing, no crackles. Normal respiratory effort. No accessory muscle use.  Cardiovascular: Regular rate and rhythm, no murmurs / rubs / gallops. No extremity edema. 2+ pedal pulses. No carotid bruits.  He has pacemaker on left side of chest. Abdomen: no tenderness, no masses palpated. No hepatosplenomegaly. Bowel sounds positive.  Musculoskeletal: no clubbing / cyanosis. No joint deformity upper and lower extremities. Good ROM, no contractures. Normal muscle tone.  Skin: no rashes, lesions, ulcers. No induration Neurologic: CN 2-12 grossly intact. Sensation intact, DTR normal. Strength 5/5 in all 4.  Psychiatric: Normal judgment and insight. Alert and oriented x 3. Normal mood.    Labs on Admission: I have personally reviewed following labs and imaging studies  CBC: Recent Labs  Lab 10/31/18 2209  WBC 4.6  NEUTROABS 2.2  HGB 13.1  HCT 42.6  MCV 99.3  PLT 81*   Basic Metabolic Panel: Recent Labs  Lab 10/31/18 2209  NA 139  K 4.2  CL 105  CO2 27  GLUCOSE 116*  BUN 27*  CREATININE 1.16*  CALCIUM 10.6*   GFR: Estimated Creatinine Clearance: 32.5 mL/min (A) (by C-G formula based on SCr of 1.16 mg/dL (H)). Liver Function Tests: Recent Labs  Lab 10/31/18 2209  AST  20  ALT 13  ALKPHOS 40  BILITOT 0.6  PROT 6.0*  ALBUMIN 3.7   No results for input(s): LIPASE, AMYLASE in the last 168 hours. No results for input(s): AMMONIA in the last 168 hours. Coagulation Profile: Recent Labs  Lab 10/31/18 2209  INR 1.2  Cardiac Enzymes: No results for input(s): CKTOTAL, CKMB, CKMBINDEX, TROPONINI in the last 168 hours. BNP (last 3 results) No results for input(s): PROBNP in the last 8760 hours. HbA1C: No results for input(s): HGBA1C in the last 72 hours. CBG: Recent Labs  Lab 10/31/18 2124  GLUCAP 127*   Lipid Profile: No results for input(s): CHOL, HDL, LDLCALC, TRIG, CHOLHDL, LDLDIRECT in the last 72 hours. Thyroid Function Tests: No results for input(s): TSH, T4TOTAL, FREET4, T3FREE, THYROIDAB in the last 72 hours. Anemia Panel: No results for input(s): VITAMINB12, FOLATE, FERRITIN, TIBC, IRON, RETICCTPCT in the last 72 hours. Urine analysis:    Component Value Date/Time   COLORURINE YELLOW 10/31/2018 2311   APPEARANCEUR HAZY (A) 10/31/2018 2311   LABSPEC >1.030 (H) 10/31/2018 2311   PHURINE 5.0 10/31/2018 2311   GLUCOSEU NEGATIVE 10/31/2018 2311   HGBUR TRACE (A) 10/31/2018 2311   BILIRUBINUR NEGATIVE 10/31/2018 2311   KETONESUR NEGATIVE 10/31/2018 2311   PROTEINUR NEGATIVE 10/31/2018 2311   UROBILINOGEN 0.2 04/17/2009 1759   NITRITE NEGATIVE 10/31/2018 2311   LEUKOCYTESUR NEGATIVE 10/31/2018 2311    Radiological Exams on Admission: Ct Head Wo Contrast  Result Date: 10/31/2018 CLINICAL DATA:  Altered mental status, concern for stroke EXAM: CT HEAD WITHOUT CONTRAST TECHNIQUE: Contiguous axial images were obtained from the base of the skull through the vertex without intravenous contrast. COMPARISON:  CT 09/12/2018 FINDINGS: Brain: Subcortical regions of hypoattenuation in the superior frontal gyri bilaterally are similar to prior. Expected evolution of the gliotic changes in the left middle frontal gyrus at the site seen on prior  comparison CT. No evidence of acute infarction, hemorrhage, hydrocephalus, extra-axial collection or mass lesion/mass effect. Symmetric prominence of the ventricles, cisterns and sulci compatible with parenchymal volume loss. Patchy areas of white matter hypoattenuation are most compatible with chronic microvascular angiopathy. Vascular: Atherosclerotic calcification of the carotid siphons and intradural vertebral arteries. No hyperdense vessel. Skull: No calvarial fracture or suspicious osseous lesion. No scalp swelling or hematoma. Sinuses/Orbits: Paranasal sinuses and mastoid air cells are predominantly clear. Included orbital structures are unremarkable. Other: None IMPRESSION: 1. No acute intracranial abnormality. If there is persisting clinical concern for acute infarct, MRI could be obtained. 2. Expected evolution of the gliotic changes in the left middle frontal gyrus at the site seen on prior comparison CT. 3. Stable parenchymal volume loss and chronic microvascular ischemic white matter disease. Electronically Signed   By: Lovena Le M.D.   On: 10/31/2018 21:55    Assessment/Plan Principal Problem:   Stroke-like symptoms Active Problems:   Thrombocytopenia ([place])   Essential hypertension   Pacemaker   Acute encephalopathy   Anxiety   CKD (chronic kidney disease), stage III (HCC)   Stroke (HCC)   Dementia (HCC)    TIA: -Patient presented with slurred speech and confusion.  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 -CT head without contrast came back negative for acute findings. -Place patient under observation.  Currently patient is alert and oriented x3, moving all extremities. -Order Doppler ultrasound of bilateral carotid arteries -Echo reviewed which was done in July 2020 which showed preserved left ventricular ejection fraction of 60 to 65%.  Large mobile left atrial l thrombus on the a atrial l septum, moderate mitral valve stenosis, aortic valve sclerosis. -Continue aspirin,  atorvastatin and Eliquis -On telemetry.  Check A1c and lipid panel.  Repeat head CT in 24 hours-orders are already placed. -Neurochecks every 4 hour.  PT/OT/ST consulted. -We will keep her n.p.o. until cleared by speech  therapist for swallow evaluation. -Discussed with neurology on-call Dr. Mal Amabile recommended repeat CT head without contrast in 24 hours.    Essential hypertension: Blood pressure is currently stable -We will monitor blood pressure closely.  Paroxysmal A. fib: Stable -Continue Eliquis 5 mg p.o. twice daily, on telemetry  Sick sinus syndrome: Status post pacemaker -Stable.  Patient denies any symptoms, on telemetry.  History of strep agalactiae bacteremia: -Patient recently admitted in July 2022.  Received and finished IV Rocephin for 6 weeks. -Currently she is not on antibiotics.  Chronic diastolic congestive heart failure: Not in exacerbation. -Hold Lasix for now. -INO's and daily weight.  Chronic thrombocytopenia: -Monitor platelet count. -No signs of active bleeding.  CKD stage IIIb: -Kidney function is at baseline.  Avoid nephrotoxic medication. -Monitor BMP tomorrow a.m.  Anxiety: Stable -Continue home dose of Xanax.    DVT prophylaxis: On Eliquis Code Status: Full code  family Communication: None present at bedside.  Plan of care discussed with patient in length and he verbalized understanding and agreed with it. Disposition Plan: To be determined Consults called: Neurology Dr. Lucky Rathke Admission status: Observation.   Mckinley Jewel MD [facility] Pager [phone]  If 7PM-7AM, please contact night-coverage [email or web address] Password [facility]  11/01/2018, 4:45 PM

## 2018-11-01 NOTE — ED Notes (Signed)
Report read by accepting nurse, Ms. Meredith Mody, RN.  Conversation was made via messaging.

## 2018-11-01 NOTE — Telephone Encounter (Signed)
Received return call from [place] and was unable to speak with her secondary to being on phone with patient. Left message to call back

## 2018-11-01 NOTE — Progress Notes (Signed)
Spoke to daughter [name] who answered admission profile questions. Answered questions for daughter and updated on pt room and condition.

## 2018-11-02 ENCOUNTER — Observation Stay (HOSPITAL_COMMUNITY): Payer: Medicare Other

## 2018-11-02 ENCOUNTER — Observation Stay (HOSPITAL_BASED_OUTPATIENT_CLINIC_OR_DEPARTMENT_OTHER): Payer: Medicare Other

## 2018-11-02 DIAGNOSIS — R2990 Unspecified symptoms and signs involving the nervous system: Secondary | ICD-10-CM

## 2018-11-02 DIAGNOSIS — F0281 Dementia in other diseases classified elsewhere with behavioral disturbance: Secondary | ICD-10-CM

## 2018-11-02 DIAGNOSIS — G459 Transient cerebral ischemic attack, unspecified: Secondary | ICD-10-CM

## 2018-11-02 DIAGNOSIS — G309 Alzheimer's disease, unspecified: Secondary | ICD-10-CM | POA: Diagnosis not present

## 2018-11-02 DIAGNOSIS — F0280 Dementia in other diseases classified elsewhere without behavioral disturbance: Secondary | ICD-10-CM

## 2018-11-02 DIAGNOSIS — I69320 Aphasia following cerebral infarction: Secondary | ICD-10-CM | POA: Diagnosis not present

## 2018-11-02 LAB — LIPID PANEL
Cholesterol: 160 mg/dL (ref 0–200)
HDL: 63 mg/dL (ref >40)
LDL Cholesterol: 82 mg/dL (ref 0–99)
Total CHOL/HDL Ratio: 2.5 RATIO
Triglycerides: 73 mg/dL (ref <150)
VLDL: 15 mg/dL (ref 0–40)

## 2018-11-02 NOTE — Evaluation (Signed)
Physical Therapy Evaluation Patient Details Name: [name] MRN: [id number] DOB: 31-Oct-1929 Today's Date: 11/02/2018   History of Present Illness  85yo female who presented to [facility] with slurred speech and difficulty with words, transferred to [facility] for further care. No TPA given due to patient being on Eliquis. CT negative for acute infarct, unable to do MRI due to ICD. PMH essential tremor, CHF, dementia, HLD, A-fib, ICD, mtiral valve replacement, sick sinus syndrome, CVA, cardiac cath, hand surgery  Clinical Impression   Patient received in bathroom with OT present, doing well and requesting to participate with PT as well this morning. Able to perform functional transfers, gait approximately 147f with RW and min guard this morning; able to maintain balance with no UE support to perform peri-care in bathroom with min guard for safety. She was left up the chair with chair alarm active and all needs otherwise met/questions and concerns addressed. She will continue to benefit from skilled PT services in the acute setting, also recommend 24/7 assist and skilled HHPT services moving forward.     Follow Up Recommendations Home health PT;Supervision/Assistance - 24 hour    Equipment Recommendations  Rolling walker with 5" wheels    Recommendations for Other Services       Precautions / Restrictions Precautions Precautions: Fall Restrictions Weight Bearing Restrictions: No      Mobility  Bed Mobility               General bed mobility comments: OOB with OT  Transfers Overall transfer level: Needs assistance Equipment used: Rolling walker (2 wheeled) Transfers: Sit to/from Stand Sit to Stand: Min guard         General transfer comment: min guard and cues for hand placement  Ambulation/Gait Ambulation/Gait assistance: Min guard Gait Distance (Feet): 150 Feet Assistive device: Rolling walker (2 wheeled) Gait Pattern/deviations: Step-through pattern;Decreased  stride length;Trunk flexed;Drifts right/left Gait velocity: decreased   General Gait Details: slow but steady at self-selected pace with RW  Stairs            Wheelchair Mobility    Modified Rankin (Stroke Patients Only)       Balance Overall balance assessment: Needs assistance Sitting-balance support: No upper extremity supported;Feet supported Sitting balance-Leahy Scale: Good     Standing balance support: Bilateral upper extremity supported;During functional activity Standing balance-Leahy Scale: Fair Standing balance comment: reliant on external support, min guard for steadying with no UE support                             Pertinent Vitals/Pain Pain Assessment: No/denies pain    Home Living Family/patient expects to be discharged to:: Private residence Living Arrangements: Children;Other (Comment)(personal care attendants every other day) Available Help at Discharge: Family;Available 24 hours/day Type of Home: House Home Access: Stairs to enter Entrance Stairs-Rails: Right;Left;Can reach both Entrance Stairs-Number of Steps: 1 Home Layout: Two level;Able to live on main level with bedroom/bathroom Home Equipment: W[place]ford Rile- 2 wheels;Cane - single point;Shower seat;Grab bars - toilet;Grab bars - tub/shower      Prior Function Level of Independence: Needs assistance   Gait / Transfers Assistance Needed: Reports she uses RW for ambulation   ADL's / Homemaking Assistance Needed: daughter assist with bathing        Hand Dominance   Dominant Hand: Right    Extremity/Trunk Assessment   Upper Extremity Assessment Upper Extremity Assessment: Defer to OT evaluation  Lower Extremity Assessment Lower Extremity Assessment: Generalized weakness    Cervical / Trunk Assessment Cervical / Trunk Assessment: Kyphotic  Communication   Communication: HOH  Cognition Arousal/Alertness: Awake/alert Behavior During Therapy: WFL for tasks  assessed/performed Overall Cognitive Status: Within Functional Limits for tasks assessed                                        General Comments      Exercises     Assessment/Plan    PT Assessment Patient needs continued PT services  PT Problem List Decreased strength;Decreased mobility;Decreased safety awareness;Decreased coordination;Decreased activity tolerance;Decreased balance       PT Treatment Interventions Gait training;DME instruction;Stair training;Therapeutic activities;Functional mobility training;Therapeutic exercise;Balance training;Patient/family education    PT Goals (Current goals can be found in the Care Plan section)  Acute Rehab PT Goals Patient Stated Goal: to go home PT Goal Formulation: With patient Time For Goal Achievement: 11/16/18 Potential to Achieve Goals: Good    Frequency Min 3X/week   Barriers to discharge        Co-evaluation               AM-PAC PT "6 Clicks" Mobility  Outcome Measure Help needed turning from your back to your side while in a flat bed without using bedrails?: A Little Help needed moving from lying on your back to sitting on the side of a flat bed without using bedrails?: A Little Help needed moving to and from a bed to a chair (including a wheelchair)?: A Little Help needed standing up from a chair using your arms (e.g., wheelchair or bedside chair)?: A Little Help needed to walk in hospital room?: A Little Help needed climbing 3-5 steps with a railing? : A Lot 6 Click Score: 17    End of Session Equipment Utilized During Treatment: Gait belt Activity Tolerance: Patient tolerated treatment well Patient left: in chair;with call bell/phone within reach;with chair alarm set   PT Visit Diagnosis: Unsteadiness on feet (R26.81);Muscle weakness (generalized) (M62.81);Other abnormalities of gait and mobility (R26.89)    Time: 8208-1388 PT Time Calculation (min) (ACUTE ONLY): 23 min   Charges:    PT Evaluation $PT Eval Low Complexity: 1 Low PT Treatments $Gait Training: 8-22 mins        Deniece Ree PT, DPT, CBIS  Supplemental Physical Therapist [place]    Pager [phone] Acute Rehab Office [phone]

## 2018-11-02 NOTE — Progress Notes (Signed)
Carotid duplex has been completed.   Preliminary results in CV Proc.   Abram Sander 11/02/2018 10:37 AM

## 2018-11-02 NOTE — Progress Notes (Signed)
Courtesy visit. Her neuro deficits seem better than previously described, just a little hesitation in initiating speech. Agree with care plan. She already has a pacemaker download scheduled on 11/29/2018 and then a virtual office visit w me on 12/03/2018. I am still uncertain whether or not she is still receiving amlodipine/olmesartan at the nursing facility.  Could not find an MAR from [facility]. Her BP is fine right now. Thank you for taking care of this delightful lady.  Sanda Klein, MD, [facility] [phone] office [phone] pager

## 2018-11-02 NOTE — TOC Transition Note (Signed)
Transition of Care [facility]) - CM/SW Discharge Note   Patient Details  Name: Khyrie Doleman MRN: [id number] Date of Birth: 01-30-1930  Transition of Care [facility] CM/SW Contact:  Pollie Friar, RN Phone Number: 11/02/2018, 4:59 PM   Clinical Narrative:    Pt is discharging back home today with resumption of [facility] services. Butch Penny with [facility] aware of d/c.  Pts son to provide transport home.  CM provided number to daughter for assistance in finding ALF.    Final next level of care: Home w Home Health Services Barriers to Discharge: No Barriers Identified   Patient Goals and CMS Choice   CMS Medicare.gov Compare Post Acute Care list provided to:: Patient Represenative (must comment) Choice offered to / list presented to : Adult Children  Discharge Placement                       Discharge Plan and Services   Discharge Planning Services: CM Consult                      HH Arranged: PT, OT, Nurse's Aide, Speech Therapy HH Agency: [facility] [place] [facility]     Representative spoke with at [place]: [place] with [facility] aware of d/c home today  Social Determinants of Health (SDOH) Interventions     Readmission Risk Interventions No flowsheet data found.

## 2018-11-02 NOTE — Care Management Obs Status (Signed)
[place] NOTIFICATION   Patient Details  Name: Oletha Emshoff MRN: [id number] Date of Birth: 1929/04/19   Medicare Observation Status Notification Given:  Yes    Pollie Friar, RN 11/02/2018, 12:26 PM

## 2018-11-02 NOTE — TOC Initial Note (Signed)
Transition of Care [facility]) - Initial/Assessment Note    Patient Details  Name: [name] MRN: [id number] Date of Birth: [birth year]  [facility]) CM/SW Contact:    Pollie Friar, RN Phone Number: 11/02/2018, 12:07 PM  Clinical Narrative:                 Pt recently admitted with a stroke and d/ced to [facility]. She has been d/ced from [facility] and is currently active with [facility]. Butch Penny with [facility] is aware of admission. Awaiting PT/OT evals. CM following for d/c disposition.   Expected Discharge Plan: [place] Barriers to Discharge: Continued Medical Work up   Patient Goals and CMS Choice   CMS Medicare.gov Compare Post Acute Care list provided to:: Patient Represenative (must comment) Choice offered to / list presented to : Adult Children  Expected Discharge Plan and Services Expected Discharge Plan: [place]   Discharge Planning Services: CM Consult   Living arrangements for the past 2 months: Single Family Home Expected Discharge Date: 11/03/18                                    Prior Living Arrangements/Services Living arrangements for the past 2 months: Single Family Home Lives with:: Adult Children Patient language and need for interpreter reviewed:: Yes(no needs) Do you feel safe going back to the place where you live?: Yes          Current home services: Home PT, Home OT, Other (comment)(speech) Criminal Activity/Legal Involvement Pertinent to Current Situation/Hospitalization: No - Comment as needed  Activities of Daily Living Home Assistive Devices/Equipment: Walker (specify type) ADL Screening (condition at time of admission) Patient's cognitive ability adequate to safely complete daily activities?: No Is the patient deaf or have difficulty hearing?: No Does the patient have difficulty seeing, even when wearing glasses/contacts?: No Does the patient have difficulty concentrating, remembering, or  making decisions?: Yes Patient able to express need for assistance with ADLs?: No Does the patient have difficulty dressing or bathing?: Yes Independently performs ADLs?: No Dressing (OT): Needs assistance Is this a change from baseline?: Pre-admission baseline Grooming: Needs assistance Is this a change from baseline?: Pre-admission baseline Feeding: Independent Is this a change from baseline?: Pre-admission baseline Bathing: Dependent Is this a change from baseline?: Pre-admission baseline Toileting: Needs assistance Is this a change from baseline?: Pre-admission baseline In/Out Bed: Needs assistance Is this a change from baseline?: Pre-admission baseline Walks in Home: Independent with device (comment)(rolling walker) Does the patient have difficulty walking or climbing stairs?: Yes Weakness of Legs: Both Weakness of Arms/Hands: None  Permission Sought/Granted                  Emotional Assessment Appearance:: Appears stated age   Affect (typically observed): Accepting, Pleasant Orientation: : Oriented to Self, Oriented to Place, Oriented to  Time, Oriented to Situation   Psych Involvement: No (comment)  Admission diagnosis:  Aphasia due to recent stroke [I69.320] Cerebrovascular accident (CVA), unspecified mechanism ([place]) [I63.9] Patient Active Problem List   Diagnosis Date Noted  . Stroke-like symptoms 11/01/2018  . AMS (altered mental status) 10/31/2018  . Dementia ([place]) 10/18/2018  . Stroke ([place]) 09/11/2018  . Thrombus of atrial appendage 09/11/2018  . Bacteremia 09/01/2018  . Tachycardia-bradycardia syndrome ([place])   . Atrial fibrillation with slow ventricular response ([place])   . Pacemaker battery depletion 08/16/2018  .  Fall 02/11/2018  . GERD (gastroesophageal reflux disease) 02/11/2018  . Anxiety 02/11/2018  . CKD (chronic kidney disease), stage III ([place]) 02/11/2018  . Need for prophylactic vaccination and inoculation against influenza 11/19/2015  .  Acute encephalopathy 01/31/2015  . Pneumonia, community acquired 01/28/2015  . PNA (pneumonia) 01/28/2015  . Pacemaker 05/06/2014  . Bilateral leg edema 08/20/2013  . S/P mitral valve replacement with bioprosthetic valve - 29 mm Biocor 04/22/2013  . Permanent atrial fibrillation 04/22/2013  . Essential hypertension 04/22/2013  . Hyperlipidemia 04/22/2013  . Hypercalcemia 11/28/2012  . Ureteral stone 08/02/2012  . Thrombocytopenia ([place]) 01/20/2011  . Chronic diastolic congestive heart failure ([place]) 04/04/2007  . BRONCHOPNEUMONIA ORGANISM UNSPECIFIED 03/13/2007   PCP:  Lawerance Cruel, MD Pharmacy:   [id number] [address], [place] [address] Phone: [phone] Fax: [phone]     Social Determinants of Health (SDOH) Interventions    Readmission Risk Interventions No flowsheet data found.

## 2018-11-02 NOTE — Evaluation (Signed)
Speech Language Pathology Evaluation Patient Details Name: [name] MRN: [id number] DOB: [birth year] Today's Date: 11/02/2018 Time: [id number] SLP Time Calculation (min) (ACUTE ONLY): 20 min  Problem List:  Patient Active Problem List   Diagnosis Date Noted  . Stroke-like symptoms 11/01/2018  . AMS (altered mental status) 10/31/2018  . Dementia ([place]) 10/18/2018  . Stroke ([place]) 09/11/2018  . Thrombus of atrial appendage 09/11/2018  . Bacteremia 09/01/2018  . Tachycardia-bradycardia syndrome ([place])   . Atrial fibrillation with slow ventricular response ([place])   . Pacemaker battery depletion 08/16/2018  . Fall 02/11/2018  . GERD (gastroesophageal reflux disease) 02/11/2018  . Anxiety 02/11/2018  . CKD (chronic kidney disease), stage III ([place]) 02/11/2018  . Need for prophylactic vaccination and inoculation against influenza 11/19/2015  . Acute encephalopathy 01/31/2015  . Pneumonia, community acquired 01/28/2015  . PNA (pneumonia) 01/28/2015  . Pacemaker 05/06/2014  . Bilateral leg edema 08/20/2013  . S/P mitral valve replacement with bioprosthetic valve - 29 mm Biocor 04/22/2013  . Permanent atrial fibrillation 04/22/2013  . Essential hypertension 04/22/2013  . Hyperlipidemia 04/22/2013  . Hypercalcemia 11/28/2012  . Ureteral stone 08/02/2012  . Thrombocytopenia ([place]) 01/20/2011  . Chronic diastolic congestive heart failure ([place]) 04/04/2007  . BRONCHOPNEUMONIA ORGANISM UNSPECIFIED 03/13/2007   Past Medical History:  Past Medical History:  Diagnosis Date  . Anxiety   . Arthritis    "mostly my hands" (01/29/2015)  . Basal cell carcinoma of forehead   . Benign essential tremor   . CAP (community acquired pneumonia) 01/29/2015  . CHF (congestive heart failure) ([place])   . Dementia ([place]) 10/18/2018  . Depression   . Edema of both legs 04-23-2013  pt states feet swelling (wears ted hose)   bilateral venous ablation procedures done in IR, L>R  . GERD (gastroesophageal  reflux disease)   . H/O hiatal hernia   . Hyperlipidemia   . Hyperparathyroidism ([place])    MILD--  STABLE  . Hypertension   . Kidney stones   . Osteopenia   . Permanent atrial fibrillation    CARDIOLOGIST-- DR [id number]  . Pneumonia "several times"  . Presence of permanent cardiac pacemaker    LAST PACER CHECK  04-23-2013  IN EPIC  . Right ureteral stone   . S/P mitral valve replacement with bioprosthetic valve    04-03-2009  . Schatzki's ring   . Sick sinus syndrome ([place])    S/P  PACEMAKER  2009  . Stroke ([place]) 08/2018  . Stroke ([place])   . Thrombocytopenia, immune ([place])    CHRONIC---  HEMOTOLOGIST---  DR ENNEVER  . Venous insufficiency, peripheral   . Wears glasses    Past Surgical History:  Past Surgical History:  Procedure Laterality Date  . CARDIAC CATHETERIZATION  03/27/2009   normal coronaries; grade IV Mitral Insuff.  Marland Kitchen CARDIAC CATHETERIZATION  2001  &  2006  . CARDIAC VALVE REPLACEMENT    . CATARACT EXTRACTION W/ INTRAOCULAR LENS  IMPLANT, BILATERAL Bilateral ~ 2012  . CYSTOSCOPY W/ URETERAL STENT PLACEMENT Left 08/01/2012   Procedure: CYSTOSCOPY WITH RETROGRADE PYELOGRAM/URETERAL STENT PLACEMENT;  Surgeon: Dutch Gray, MD;  Location: [facility];  Service: Urology;  Laterality: Left;  . CYSTOSCOPY WITH URETEROSCOPY Left 08/14/2012   Procedure: LEFT URETEROSCOPY WITH HOLMIUM LASER AND LEFT  STENT PLACEMENT;  Surgeon: Malka So, MD;  Location: [facility];  Service: Urology;  Laterality: Left;  . CYSTOSCOPY WITH URETEROSCOPY AND STENT PLACEMENT Right 04/25/2013   Procedure: RIGHT URETEROSCOPY WITH  STENT PLACEMENT;  Surgeon:  Irine Seal, MD;  [facility];  Service: Urology;  Laterality: Right;  . ESOPHAGOGASTRODUODENOSCOPY (EGD) WITH ESOPHAGEAL DILATION  "a couple times" (01/29/2015)  . EXTRACORPOREAL SHOCK WAVE LITHOTRIPSY Left 07-26-2012  . HAND SURGERY Right 05/2011   "no fracture; straightened things up  . HAND SURGERY Left 2014   "no fracture;  straightened things up"  . HOLMIUM LASER APPLICATION Left [id number]   Procedure:  HOLMIUM LASER APPLICATION;  Surgeon: Malka So, MD;  Location: [facility];  Service: Urology;  Laterality: Left;  . HOLMIUM LASER APPLICATION Right [id number]   Procedure: HOLMIUM LASER APPLICATION;  Surgeon: Irine Seal, MD;  Location: [facility];  Service: Urology;  Laterality: Right;  . MITRAL VALVE REPLACEMENT WITH CHORDAL PRESEVATION USING BIOPROSTHETIC VALVE/  LIGATION LEFT ATRIAL APPENDAGE/  LEFT-SIDED MAZE PROCEDURE  04-03-2009  DR Ethel Rana,  SERIAL # [id number]  . NM MYOCAR PERF WALL MOTION  03/01/1999   mild lateral ischemia  . PERMANENT PACEMAKER INSERTION  02/26/2007   Dual Chamber [employer]onic Programmed VVIR--- Generator [id number]/  [id number]   . PPM GENERATOR CHANGEOUT N/A 08/20/2018   Procedure: PPM GENERATOR CHANGEOUT;  Surgeon: Sanda Klein, MD;  Location: [place] [facility];  Service: Cardiovascular;  Laterality: N/A;  . TEE WITHOUT CARDIOVERSION N/A 09/05/2018   Procedure: TRANSESOPHAGEAL ECHOCARDIOGRAM (TEE);  Surgeon: Pixie Casino, MD;  Location: [facility];  Service: Cardiovascular;  Laterality: N/A;  . TRANSTHORACIC ECHOCARDIOGRAM  09-07-2012  DR CROITORU   MILD LVH/  EF 55-60%/  MILD TO MODERATE AR/   SEVERE LAE  &  RAE/  MILD TO MODERATE TR/   MITRAL VALVE BIOPROSTHESIS LEAFLET NORMAL,  MODERATE STENOSIS WITH NO SIG. REGURG/  MODERATE RVE  . VAGINAL HYSTERECTOMY  1982   HPI:  Pt is an 85 y.o. female with medical history significant of hypertension, paroxysmal A. Fib-on Eliquis, sick sinus syndrome status post PPM, hyperlipidemia, chronic diastolic congestive heart failure, CKD stage III, GERD, essential tremors, basal cell carcinoma of forehead, mitral valve regurgitation, strep endocarditis who was admitted to [place] secondary to word finding difficulty and was transferred to [facility] for further management. CT of the head was negative for acute  changes.    Assessment / Plan / Recommendation Clinical Impression  Pt is known to speech pathology and participated in a speech/language/cognition evaluation on 09/11/18. Her language skills were within normal limits at that time and her speech and cognitive-linguistic skills were at baseline. Pt reported again today that she is still living with her son and that her daughter, [name], is still managing her medications. She indicated that there is currently someone at home with her at all times. Pt's motor speech skills and cognitive-linguistic skills are currently at baseline with impairments in both areas. Her language deficits have resolved and are currently within normal limits but repetition is needed for completion of commands due to impairments in memory. Further skilled SLP services are not clinically indicated at this time. Pt and nursing were educated regarding results and recommendations; both parties verbalized understanding as well as agreement with plan of care.     SLP Assessment  SLP Recommendation/Assessment: Patient does not need any further Speech Lanaguage Pathology Services SLP Visit Diagnosis: Aphasia (R47.01)    Follow Up Recommendations  None    Frequency and Duration           SLP Evaluation Cognition  Overall Cognitive Status: History of cognitive impairments - at baseline Arousal/Alertness:  Awake/alert Orientation Level: Oriented to person;Oriented to place;Oriented to situation;Disoriented to time(Oriented to year, not month, day or date) Attention: Focused;Sustained Focused Attention: Appears intact Sustained Attention: Impaired Sustained Attention Impairment: Verbal complex Memory: Impaired Memory Impairment: Storage deficit;Retrieval deficit;Decreased recall of new information(Immediate: 2/3; delayed: 1/3; with cues: 1/2) Awareness: Appears intact Problem Solving: Impaired Problem Solving Impairment: Verbal complex       Comprehension  Auditory  Comprehension Overall Auditory Comprehension: Appears within functional limits for tasks assessed Basic Immediate Environment Questions: (5/5) Complex Questions: (2/5) Paragraph Comprehension (via yes/no questions): Not tested Two Step Basic Commands: (3/4) Multistep Basic Commands: Not tested Conversation: Complex Interfering Components: Attention;Processing speed;Working Marine scientist Reading Comprehension Reading Status: Not tested    Expression Expression Primary Mode of Expression: Verbal Verbal Expression Overall Verbal Expression: Appears within functional limits for tasks assessed Initiation: No impairment Automatic Speech: Counting;Day of week;Month of year(WNL) Level of Generative/Spontaneous Verbalization: Conversation Repetition: No impairment(4/4) Naming: No impairment Pragmatics: No impairment Written Expression Dominant Hand: Right   Oral / Motor  Motor Speech Overall Motor Speech: Impaired at baseline Respiration: Within functional limits Phonation: Normal Resonance: Within functional limits Articulation: Impaired Level of Impairment: Conversation Intelligibility: Intelligible Motor Planning: Witnin functional limits Motor Speech Errors: Not applicable   Matheo Rathbone I. [place], [place], [place] Office number [phone] Pager [place] 11/02/2018, 1:01 PM

## 2018-11-02 NOTE — Progress Notes (Deleted)
While in the room patient doing shift assessment, patient stated "I don't feel good, I think Im going to vomit". Patient vomited a small amount of what appeared to be dark red blood (unmeasureable). Per patient's request zofran was given (see MAR). MD will be notified.

## 2018-11-02 NOTE — Evaluation (Signed)
Occupational Therapy Evaluation Patient Details Name: [name] MRN: [id number] DOB: Dec 24, 1929 Today's Date: 11/02/2018    History of Present Illness 85yo female who presented to [facility] with slurred speech and difficulty with words, transferred to [facility] for further care. No TPA given due to patient being on Eliquis. CT negative for acute infarct, unable to do MRI due to ICD. PMH essential tremor, CHF, dementia, HLD, A-fib, ICD, mtiral valve replacement, sick sinus syndrome, CVA, cardiac cath, hand surgery   Clinical Impression   Pt admitted with the above diagnoses and presents with below problem list. Pt will benefit from continued acute OT to address the below listed deficits and maximize independence with basic ADLs prior to d/c. PTA pt reports needing assistance with bathing and using a walker. Pt is from home where she lives with her son, daughter also assists. Pt eager to walk this session. Pt min guard to min A with most ADLs and functional mobility. Suspect she is not too far from her baseline.      Follow Up Recommendations  Home health OT;Supervision/Assistance - 24 hour    Equipment Recommendations  None recommended by OT    Recommendations for Other Services       Precautions / Restrictions Precautions Precautions: Fall Restrictions Weight Bearing Restrictions: No      Mobility Bed Mobility Overal bed mobility: Needs Assistance Bed Mobility: Supine to Sit     Supine to sit: Supervision     General bed mobility comments: Increased time and effort  Transfers Overall transfer level: Needs assistance Equipment used: Rolling walker (2 wheeled) Transfers: Sit to/from Stand Sit to Stand: Min guard         General transfer comment: min guard and cues for hand placement    Balance Overall balance assessment: Needs assistance Sitting-balance support: No upper extremity supported;Feet supported Sitting balance-Leahy Scale: Good     Standing  balance support: Bilateral upper extremity supported;During functional activity Standing balance-Leahy Scale: Fair Standing balance comment: reliant on external support, min guard for steadying with no UE support                           ADL either performed or assessed with clinical judgement   ADL Overall ADL's : Needs assistance/impaired Eating/Feeding: Set up   Grooming: Wash/dry hands;Min guard;Standing Grooming Details (indicate cue type and reason): external support utilized Upper Body Bathing: Minimal assistance   Lower Body Bathing: Minimal assistance   Upper Body Dressing : Set up;Minimal assistance   Lower Body Dressing: Min guard;Minimal assistance   Toilet Transfer: Min guard;Ambulation;BSC;RW   Toileting- Clothing Manipulation and Hygiene: Sit to/from stand;Minimal assistance   Tub/ Shower Transfer: Minimal assistance;Ambulation;Shower seat;Rolling walker   Functional mobility during ADLs: Min guard;Rolling walker General ADL Comments: Pt completed toilet transfer and pericare then asking to walk in hall. Pt likely close to baseline with ADLs and mobility. Walked household distance at min guard level.      Vision Baseline Vision/History: Wears glasses       Perception     Praxis      Pertinent Vitals/Pain Pain Assessment: No/denies pain     Hand Dominance Right   Extremity/Trunk Assessment Upper Extremity Assessment Upper Extremity Assessment: Generalized weakness RUE Deficits / Details: tremors noted LUE Deficits / Details: tremors noted   Lower Extremity Assessment Lower Extremity Assessment: Defer to PT evaluation   Cervical / Trunk Assessment Cervical / Trunk Assessment: Kyphotic  Communication Communication Communication: HOH   Cognition Arousal/Alertness: Awake/alert Behavior During Therapy: Flat affect;WFL for tasks assessed/performed Overall Cognitive Status: History of cognitive impairments - at baseline                                      General Comments       Exercises     Shoulder Instructions      Home Living Family/patient expects to be discharged to:: Private residence Living Arrangements: Children;Other (Comment)(personal care attendants every other day) Available Help at Discharge: Family;Available 24 hours/day Type of Home: House Home Access: Stairs to enter [employer] of Steps: 1 Entrance Stairs-Rails: Right;Left;Can reach both Home Layout: Two level;Able to live on main level with bedroom/bathroom Alternate Level Stairs-Number of Steps: reports her children do not let her go up the stairs Alternate Level Stairs-Rails: Right;Left;Can reach both Bathroom Shower/Tub: Occupational psychologist: Handicapped height     Home Equipment: Environmental consultant - 2 wheels;Cane - single point;Shower seat;Grab bars - toilet;Grab bars - tub/shower      Lives With: Family    Prior Functioning/Environment Level of Independence: Needs assistance  Gait / Transfers Assistance Needed: Reports she uses RW for ambulation  ADL's / Homemaking Assistance Needed: daughter assist with bathing            OT Problem List: Decreased strength;Decreased activity tolerance;Impaired balance (sitting and/or standing);Decreased cognition;Decreased knowledge of use of DME or AE;Decreased knowledge of precautions      OT Treatment/Interventions: Self-care/ADL training;Therapeutic exercise;Neuromuscular education;DME and/or AE instruction;Therapeutic activities;Cognitive remediation/compensation;Balance training;Patient/family education    OT Goals(Current goals can be found in the care plan section) Acute Rehab OT Goals Patient Stated Goal: to go home OT Goal Formulation: With patient Time For Goal Achievement: 11/16/18 Potential to Achieve Goals: Good ADL Goals Pt Will Perform Grooming: with supervision;standing;sitting Pt Will Perform Lower Body Bathing: sit to/from stand;with min  guard assist Pt Will Perform Lower Body Dressing: with min guard assist;sit to/from stand Pt Will Transfer to Toilet: with supervision;ambulating Pt Will Perform Toileting - Clothing Manipulation and hygiene: with min guard assist;sit to/from stand  OT Frequency: Min 2X/week   Barriers to D/C:            Co-evaluation              AM-PAC OT "6 Clicks" Daily Activity     Outcome Measure Help from another person eating meals?: A Little Help from another person taking care of personal grooming?: A Little Help from another person toileting, which includes using toliet, bedpan, or urinal?: A Little Help from another person bathing (including washing, rinsing, drying)?: A Little Help from another person to put on and taking off regular upper body clothing?: A Little Help from another person to put on and taking off regular lower body clothing?: A Little 6 Click Score: 18   End of Session Equipment Utilized During Treatment: Gait belt;Rolling walker  Activity Tolerance: Patient tolerated treatment well Patient left: Other (comment)(in bathroom with PT)  OT Visit Diagnosis: Unsteadiness on feet (R26.81)                Time: [id number] OT Time Calculation (min): 18 min Charges:  OT General Charges $OT Visit: 1 Visit OT Evaluation $OT Eval Low Complexity: [place], OT Acute Rehabilitation Services Pager: [phone] Office: [phone]   Hortencia Pilar 11/02/2018, 12:26 PM

## 2018-11-02 NOTE — Progress Notes (Signed)
STROKE TEAM PROGRESS NOTE   INTERVAL HISTORY I have personally reviewed reviewed history of presenting illness, electronic medical records and imaging films in PACS.  She presented with transient slurred speech which has improved.  She is on Eliquis and states she was compliant with it.  CT scan of the head x2 is negative for acute stroke.  She has a pacemaker hence MRI cannot be done.  She had a recent stroke in July 2020 from which she recovered well  Vitals:   11/02/18 0015 11/02/18 0425 11/02/18 0907 11/02/18 1122  BP: 121/63 (!) 127/55 (!) 144/74 122/78  Pulse: 76 64 71 68  Resp: 16 18 20 20   Temp: 97.7 F (36.5 C) 97.8 F (36.6 C) 98.2 F (36.8 C) 97.8 F (36.6 C)  TempSrc: Oral Oral Oral Oral  SpO2: 96% 97% 95% 96%  Weight:      Height:        CBC:  Recent Labs  Lab 10/31/18 2209 11/01/18 1640  WBC 4.6 5.2  NEUTROABS 2.2  --   HGB 13.1 14.6  HCT 42.6 44.8  MCV 99.3 98.0  PLT 81* PLATELET CLUMPS NOTED ON SMEAR, UNABLE TO ESTIMATE    Basic Metabolic Panel:  Recent Labs  Lab 10/31/18 2209 11/01/18 1640  NA 139  --   K 4.2  --   CL 105  --   CO2 27  --   GLUCOSE 116*  --   BUN 27*  --   CREATININE 1.16* 0.99  CALCIUM 10.6*  --    Lipid Panel:     Component Value Date/Time   CHOL 160 11/02/2018 0327   TRIG 73 11/02/2018 0327   HDL 63 11/02/2018 0327   CHOLHDL 2.5 11/02/2018 0327   VLDL 15 11/02/2018 0327   LDLCALC 82 11/02/2018 0327   HgbA1c:  Lab Results  Component Value Date   HGBA1C 5.4 09/11/2018   Urine Drug Screen:     Component Value Date/Time   LABOPIA NONE DETECTED 09/10/2018 2308   COCAINSCRNUR NONE DETECTED 09/10/2018 2308   LABBENZ POSITIVE (A) 09/10/2018 2308   AMPHETMU NONE DETECTED 09/10/2018 2308   THCU NONE DETECTED 09/10/2018 2308   LABBARB NONE DETECTED 09/10/2018 2308    Alcohol Level     Component Value Date/Time   ETH <10 09/10/2018 2205    IMAGING Ct Head Wo Contrast  Result Date: 11/02/2018 CLINICAL DATA:   Follow-up stroke. EXAM: CT HEAD WITHOUT CONTRAST TECHNIQUE: Contiguous axial images were obtained from the base of the skull through the vertex without intravenous contrast. COMPARISON:  10/31/2018 and 09/12/2018 FINDINGS: Brain: Ventricles and cisterns are within normal. Prominent CSF spaces compatible with age related atrophic change and stable. No mass, mass effect, shift of midline structures or acute hemorrhage. Mild chronic ischemic microvascular disease is present. No evidence of acute infarction. Vascular: No hyperdense vessel or unexpected calcification. Skull: Normal. Negative for fracture or focal lesion. Sinuses/Orbits: No acute finding. Other: None. IMPRESSION: No acute findings. Age related atrophy and chronic ischemic microvascular disease. Electronically Signed   By: Marin Olp M.D.   On: 11/02/2018 09:24   Ct Head Wo Contrast  Result Date: 10/31/2018 CLINICAL DATA:  Altered mental status, concern for stroke EXAM: CT HEAD WITHOUT CONTRAST TECHNIQUE: Contiguous axial images were obtained from the base of the skull through the vertex without intravenous contrast. COMPARISON:  CT 09/12/2018 FINDINGS: Brain: Subcortical regions of hypoattenuation in the superior frontal gyri bilaterally are similar to prior. Expected evolution of the gliotic  changes in the left middle frontal gyrus at the site seen on prior comparison CT. No evidence of acute infarction, hemorrhage, hydrocephalus, extra-axial collection or mass lesion/mass effect. Symmetric prominence of the ventricles, cisterns and sulci compatible with parenchymal volume loss. Patchy areas of white matter hypoattenuation are most compatible with chronic microvascular angiopathy. Vascular: Atherosclerotic calcification of the carotid siphons and intradural vertebral arteries. No hyperdense vessel. Skull: No calvarial fracture or suspicious osseous lesion. No scalp swelling or hematoma. Sinuses/Orbits: Paranasal sinuses and mastoid air cells are  predominantly clear. Included orbital structures are unremarkable. Other: None IMPRESSION: 1. No acute intracranial abnormality. If there is persisting clinical concern for acute infarct, MRI could be obtained. 2. Expected evolution of the gliotic changes in the left middle frontal gyrus at the site seen on prior comparison CT. 3. Stable parenchymal volume loss and chronic microvascular ischemic white matter disease. Electronically Signed   By: Lovena Le M.D.   On: 10/31/2018 21:55   Vas US Carotid (at [facility] Only)  Result Date: 11/02/2018 Carotid Arterial Duplex Study Indications:       Confusion. Risk Factors:      Hypertension, hyperlipidemia, prior CVA. Limitations        Today's exam was limited due to vessel tourtuousity. Comparison Study:  no prior Performing Technologist: Abram Sander RVS  Examination Guidelines: A complete evaluation includes B-mode imaging, spectral Doppler, color Doppler, and power Doppler as needed of all accessible portions of each vessel. Bilateral testing is considered an integral part of a complete examination. Limited examinations for reoccurring indications may be performed as noted.  Right Carotid Findings: +----------+--------+--------+--------+------------------+--------+           PSV cm/sEDV cm/sStenosisPlaque DescriptionComments +----------+--------+--------+--------+------------------+--------+ CCA Prox  90                      heterogenous               +----------+--------+--------+--------+------------------+--------+ CCA Distal43      6               heterogenous               +----------+--------+--------+--------+------------------+--------+ ICA Prox  65      19      1-39%   heterogenous      tortuous +----------+--------+--------+--------+------------------+--------+ ICA Distal73      12                                         +----------+--------+--------+--------+------------------+--------+ ECA       61      12                                          +----------+--------+--------+--------+------------------+--------+ +----------+--------+-------+--------+-------------------+           PSV cm/sEDV cmsDescribeArm Pressure (mmHG) +----------+--------+-------+--------+-------------------+ [id number]                                         +----------+--------+-------+--------+-------------------+ +---------+--------+--+--------+-+---------+ VertebralPSV cm/s32EDV cm/s6Antegrade +---------+--------+--+--------+-+---------+  Left Carotid Findings: +----------+--------+--------+--------+------------------+--------+           PSV cm/sEDV cm/sStenosisPlaque DescriptionComments +----------+--------+--------+--------+------------------+--------+ CCA Prox  68      11  heterogenous               +----------+--------+--------+--------+------------------+--------+ CCA Distal45      8               heterogenous               +----------+--------+--------+--------+------------------+--------+ ICA Prox  113     23      1-39%   heterogenous      tortuous +----------+--------+--------+--------+------------------+--------+ ICA Distal64      16                                         +----------+--------+--------+--------+------------------+--------+ ECA       77      8                                          +----------+--------+--------+--------+------------------+--------+ +----------+--------+--------+--------+-------------------+           PSV cm/sEDV cm/sDescribeArm Pressure (mmHG) +----------+--------+--------+--------+-------------------+ [id number]                                          +----------+--------+--------+--------+-------------------+ +---------+--------+--+--------+-+---------+ VertebralPSV cm/s30EDV cm/s7Antegrade +---------+--------+--+--------+-+---------+     Preliminary     PHYSICAL EXAM Frail elderly Caucasian lady not  in distress. . Afebrile. Head is nontraumatic. Neck is supple without bruit.    Cardiac exam no murmur or gallop. Lungs are clear to auscultation. Distal pulses are well felt. Neurological Exam ;  Awake  Alert oriented x 3. Normal speech and language.eye movements full without nystagmus.fundi were not visualized. Vision acuity and fields appear normal. Hearing is normal. Palatal movements are normal. Face symmetric. Tongue midline. Normal strength, tone, reflexes and coordination. Normal sensation. Gait deferred.  ASSESSMENT/PLAN Ms. [name] is a 86 y.o. female with history of AF on Eliquis, hypertension, hyperlipidemia, sick sinus syndrome post pacemaker, congestive heart failure, CKD stage III, GERD, essential tremors, basal cell carcinoma,  strep endocarditis presenting to [place] with difficulty getting her words out and slurred speech.   TIA vs tiny stroke not seen on CT in setting of known AF and LAA thrombus  CT head No acute abnormality. L MCA gliotic changes. Stable small vessel disease and atrophy.   Repeat CT head no acute finding  Carotid ultrasound no significant extracranial stenosis  2D Echo 09/05/2018 with large mobile LA thrombus on the atrial septum  LDL 82  HgbA1c 5.4  Eliquis for VTE prophylaxis  Eliquis (apixaban) daily prior to admission, now on aspirin 81 mg daily and Eliquis (apixaban) daily 5mg . Continue Eliquis 5mg  at d/c. Would not include additional aspirin given hx thrombocytopenia    Therapy recommendations:  HH PT, OT  Disposition:  pending   Atrial Fibrillation w/ hx LAA thrombus from 09/05/2018  Home anticoagulation:  Eliquis (apixaban) daily 5mg   Last INR 1.2  In  08/2018  Admitted on eliquis 2.5 bid, d/c on 5 bid . Continue Eliquis (apixaban) daily 5mg at discharge   Hypertension  Stable . BP goal normotensive  Hyperlipidemia  Home meds:  zocor 20  Now on lipitor 80  LDL 82, goal < 70  Continue statin at  discharge  Other Stroke Risk Factors  Advanced age  Overweight, Body mass index is 28.2 kg/m., recommend weight loss, diet and exercise as appropriate   Hx stroke/TIA  08/2018 - Small cortical L MCA infarct - likely related to LAA thrombus on underdosed [facility]  Coronary artery disease  Hx Chronic diastolic Congestive heart failure  SSS s/p pacer 2009  Other Active Problems  History of strep agalactiae bacteremia  Chronic thrombocytopenia 81->clumped  CKD stage IIIb  Anxiety   Hospital day # 0  I have personally obtained history,examined this patient, reviewed notes, independently viewed imaging studies, participated in medical decision making and plan of care.ROS completed by me personally and pertinent positives fully documented  I have made any additions or clarifications directly to the above note. Agree with note above.  She presented with transient slurred speech which appears to have improved.  She is on long-term Eliquis given history of atrial fibrillation as well as recent left atrial clot in July 2020.  I do not believe adding aspirin is indicated as she has increased bleeding risk due to thrombocytopenia, advanced age and being on Eliquis.  Maintain aggressive risk factor modification.  Long discussion with patient and answered questions.  Discussed with Dr. Wynelle Cleveland.  Greater than 50% time during this 25-minute visit was spent on counseling and coordination of care about her TIA and need for being on anticoagulation and answering questions Antony Contras, [place] Pager: [phone] 11/02/2018 6:36 PM   To contact Stroke Continuity provider, please refer to [email or web address] After hours, contact General Neurology

## 2018-11-02 NOTE — Discharge Summary (Addendum)
Physician Discharge Summary  Kealey Vasbinder [id number] DOB: August 08, 1929 DOA: 10/31/2018  PCP: Lawerance Cruel, MD  Admit date: 10/31/2018 Discharge date: 11/02/2018  Admitted From: home Disposition:  home   Recommendations for Outpatient Follow-up:  1. Follow-up on Alzheimer's dementia and long-term plans as far as caretakers and how family will manage progression of dementia 2. Recommend wean off of Xanax now and try Seroquel or similar antipsychotic at night.  Home Health: PT, OT and speech  Discharge Condition: Stable CODE STATUS: Full code Diet recommendation: Heart healthy Consultations:  Neurology   Discharge Diagnoses:  Principal Problem:   TIA (transient ischemic attack) Active Problems:   Alzheimer's dementia - with behavioral disturbances   Thrombocytopenia (HCC)   S/P mitral valve replacement with bioprosthetic valve - 29 mm Biocor   Permanent atrial fibrillation   Essential hypertension   Pacemaker   Anxiety   CKD (chronic kidney disease), stage III ([place])    Brief Summary: This is an 85 year old female with a history of Alzheimer's dementia, atrial fibrillation on Eliquis, CVA, sick sinus syndrome with pacemaker, chronic diastolic heart failure, hypertension, hyperlipidemia was brought into the hospital by her daughter for slurred speech and confusion.  She lives at home and is taken care of by a son who lives in the same home and a daughter who comes to check on her.  She was admitted for CVA work-up She was admitted in July with a CVA and was started on Eliquis at that time.  Hospital Course:  TIA-slurred speech and confusion - She is unable to get an MRI due to a pacemaker and a CT scan which has been repeated is negative - Neuro did not recommend for 2D echo to be repeated - Carotid duplex is unrevealing -PT/OT and speech therapy recommend home health therapies -Neurology recommends to continue Eliquis -I feel that her confusion is more chronic  rather than acute due to the TIA-I have spoken with her daughter and with her son and updated them that Dr Tobey Grim notes mentioned she had Alzheimers dementia- see below  Confusion - Her daughter talks about her ongoing confusion-she states that she was taken to see Dr. Jannifer Franklin who told the daughter and the brother it was coming from "old age" -Upon reviewing Dr. Tobey Grim notes, I see that he is diagnosed her with Alzheimer's dementia and did offer Namenda or Aricept but the brother stated that he needed to speak with his sister before he would start this -Of note part of her confusion is repeatedly feeling like she has to use the restroom- she wakes up at least 4 times in the middle of the night to use the restroom -The daughter says it is not due to an overactive bladder but she feels it is more psychological -I recommend they speak with the physician who has prescribed her Xanax and requests that it be weaned off and replaced with an antipsychotic at night  Atrial fibrillation Continue Eliquis  Sick sinus syndrome Status post pacemaker  Chronic diastolic heart failure Continue Lasix  CKD stage III - Stable  Discharge Exam: Vitals:   11/02/18 1122 11/02/18 1522  BP: 122/78 137/69  Pulse: 68 63  Resp: 20 18  Temp: 97.8 F (36.6 C) 97.9 F (36.6 C)  SpO2: 96% 98%   Vitals:   11/02/18 0425 11/02/18 0907 11/02/18 1122 11/02/18 1522  BP: (!) 127/55 (!) 144/74 122/78 137/69  Pulse: 64 71 68 63  Resp: 18 20 20 18   Temp: 97.8 F (  36.6 C) 98.2 F (36.8 C) 97.8 F (36.6 C) 97.9 F (36.6 C)  TempSrc: Oral Oral Oral Oral  SpO2: 97% 95% 96% 98%  Weight:      Height:        General: Pt is alert, awake, not in acute distres- Cardiovascular: RRR, S1/S2 +, no rubs, no gallops Respiratory: CTA bilaterally, no wheezing, no rhonchi Abdominal: Soft, NT, ND, bowel sounds + Extremities: no edema, no cyanosis   Discharge Instructions  Discharge Instructions    Diet - low sodium  heart healthy   Complete by: As directed    Increase activity slowly   Complete by: As directed      Allergies as of 11/02/2018      Reactions   Zoloft [sertraline Hcl] Other (See Comments)   Hallucinations, hearing things; per daughter "made her have a trip"      Medication List    TAKE these medications   acetaminophen 500 MG tablet Commonly known as: TYLENOL Take 500-1,000 mg by mouth daily as needed for moderate pain or headache.   ALPRAZolam 0.5 MG tablet Commonly known as: XANAX Take 0.5-1 tablets (0.25-0.5 mg total) by mouth See admin instructions. Take 1/2 tablet every morning and take 1 tablet at night   amLODipine-olmesartan 5-20 MG tablet Commonly known as: AZOR TAKE 1 TABLET BY MOUTH EVERY DAY   apixaban 5 MG Tabs tablet Commonly known as: ELIQUIS Take 1 tablet (5 mg total) by mouth 2 (two) times daily.   cholecalciferol 25 MCG (1000 UT) tablet Commonly known as: VITAMIN D3 Take 1,000 Units by mouth daily.   furosemide 40 MG tablet Commonly known as: LASIX Take 40 mg by mouth as directed. Every other day and as needed for weight gain   omeprazole 20 MG capsule Commonly known as: PRILOSEC Take 20 mg by mouth daily.   OVER THE COUNTER MEDICATION Take 1 tablet by mouth 2 (two) times daily. Nutrifuron (immune supplement)   potassium chloride SA 20 MEQ tablet Commonly known as: K-DUR Take 20 mEq by mouth as directed. Every other day and days you take extra Lasix   Probiotic Caps Take 1 capsule by mouth daily.   simvastatin 10 MG tablet Commonly known as: ZOCOR TAKE 1 TABLET BY MOUTH EVERY DAY AT DINNER What changed: See the new instructions.   VITAMIN B COMPLEX PO Take 1 tablet by mouth daily.   VITAMIN B PLUS+ PO Take 1 tablet by mouth daily.   VITAMIN C PO Take 1 tablet by mouth daily.       Allergies  Allergen Reactions  . Zoloft [Sertraline Hcl] Other (See Comments)    Hallucinations, hearing things; per daughter "made her have a  trip"     Procedures/Studies:    Ct Head Wo Contrast  Result Date: 11/02/2018 CLINICAL DATA:  Follow-up stroke. EXAM: CT HEAD WITHOUT CONTRAST TECHNIQUE: Contiguous axial images were obtained from the base of the skull through the vertex without intravenous contrast. COMPARISON:  10/31/2018 and 09/12/2018 FINDINGS: Brain: Ventricles and cisterns are within normal. Prominent CSF spaces compatible with age related atrophic change and stable. No mass, mass effect, shift of midline structures or acute hemorrhage. Mild chronic ischemic microvascular disease is present. No evidence of acute infarction. Vascular: No hyperdense vessel or unexpected calcification. Skull: Normal. Negative for fracture or focal lesion. Sinuses/Orbits: No acute finding. Other: None. IMPRESSION: No acute findings. Age related atrophy and chronic ischemic microvascular disease. Electronically Signed   By: Marin Olp M.D.   On:  11/02/2018 09:24   Ct Head Wo Contrast  Result Date: 10/31/2018 CLINICAL DATA:  Altered mental status, concern for stroke EXAM: CT HEAD WITHOUT CONTRAST TECHNIQUE: Contiguous axial images were obtained from the base of the skull through the vertex without intravenous contrast. COMPARISON:  CT 09/12/2018 FINDINGS: Brain: Subcortical regions of hypoattenuation in the superior frontal gyri bilaterally are similar to prior. Expected evolution of the gliotic changes in the left middle frontal gyrus at the site seen on prior comparison CT. No evidence of acute infarction, hemorrhage, hydrocephalus, extra-axial collection or mass lesion/mass effect. Symmetric prominence of the ventricles, cisterns and sulci compatible with parenchymal volume loss. Patchy areas of white matter hypoattenuation are most compatible with chronic microvascular angiopathy. Vascular: Atherosclerotic calcification of the carotid siphons and intradural vertebral arteries. No hyperdense vessel. Skull: No calvarial fracture or suspicious  osseous lesion. No scalp swelling or hematoma. Sinuses/Orbits: Paranasal sinuses and mastoid air cells are predominantly clear. Included orbital structures are unremarkable. Other: None IMPRESSION: 1. No acute intracranial abnormality. If there is persisting clinical concern for acute infarct, MRI could be obtained. 2. Expected evolution of the gliotic changes in the left middle frontal gyrus at the site seen on prior comparison CT. 3. Stable parenchymal volume loss and chronic microvascular ischemic white matter disease. Electronically Signed   By: Lovena Le M.D.   On: 10/31/2018 21:55   Vas US Carotid (at [place] Only)  Result Date: 11/02/2018 Carotid Arterial Duplex Study Indications:       Confusion. Risk Factors:      Hypertension, hyperlipidemia, prior CVA. Limitations        Today's exam was limited due to vessel tourtuousity. Comparison Study:  no prior Performing Technologist: Abram Sander RVS  Examination Guidelines: A complete evaluation includes B-mode imaging, spectral Doppler, color Doppler, and power Doppler as needed of all accessible portions of each vessel. Bilateral testing is considered an integral part of a complete examination. Limited examinations for reoccurring indications may be performed as noted.  Right Carotid Findings: +----------+--------+--------+--------+------------------+--------+           PSV cm/sEDV cm/sStenosisPlaque DescriptionComments +----------+--------+--------+--------+------------------+--------+ CCA Prox  90                      heterogenous               +----------+--------+--------+--------+------------------+--------+ CCA Distal43      6               heterogenous               +----------+--------+--------+--------+------------------+--------+ ICA Prox  65      19      1-39%   heterogenous      tortuous +----------+--------+--------+--------+------------------+--------+ ICA Distal73      12                                          +----------+--------+--------+--------+------------------+--------+ ECA       61      12                                         +----------+--------+--------+--------+------------------+--------+ +----------+--------+-------+--------+-------------------+           PSV cm/sEDV cmsDescribeArm Pressure (mmHG) +----------+--------+-------+--------+-------------------+ [id number]                                         +----------+--------+-------+--------+-------------------+ +---------+--------+--+--------+-+---------+  VertebralPSV cm/s32EDV cm/s6Antegrade +---------+--------+--+--------+-+---------+  Left Carotid Findings: +----------+--------+--------+--------+------------------+--------+           PSV cm/sEDV cm/sStenosisPlaque DescriptionComments +----------+--------+--------+--------+------------------+--------+ CCA Prox  68      11              heterogenous               +----------+--------+--------+--------+------------------+--------+ CCA Distal45      8               heterogenous               +----------+--------+--------+--------+------------------+--------+ ICA Prox  113     23      1-39%   heterogenous      tortuous +----------+--------+--------+--------+------------------+--------+ ICA Distal64      16                                         +----------+--------+--------+--------+------------------+--------+ ECA       77      8                                          +----------+--------+--------+--------+------------------+--------+ +----------+--------+--------+--------+-------------------+           PSV cm/sEDV cm/sDescribeArm Pressure (mmHG) +----------+--------+--------+--------+-------------------+ [id number]                                          +----------+--------+--------+--------+-------------------+ +---------+--------+--+--------+-+---------+ VertebralPSV cm/s30EDV cm/s7Antegrade  +---------+--------+--+--------+-+---------+  Summary: Right Carotid: Velocities in the right ICA are consistent with a 1-39% stenosis. Left Carotid: Velocities in the left ICA are consistent with a 1-39% stenosis. Vertebrals:  Bilateral vertebral arteries demonstrate antegrade flow. Subclavians: Normal flow hemodynamics were seen in bilateral subclavian              arteries. *See table(s) above for measurements and observations.  Electronically signed by Antony Contras MD on 11/02/2018 at 2:52:00 PM.    Final      The results of significant diagnostics from this hospitalization (including imaging, microbiology, ancillary and laboratory) are listed below for reference.     Microbiology: Recent Results (from the past 240 hour(s))  SARS CORONAVIRUS 2 (TAT 6-24 HRS) Nasopharyngeal Nasopharyngeal Swab     Status: None   Collection Time: 10/31/18 11:11 PM   Specimen: Nasopharyngeal Swab  Result Value Ref Range Status   SARS Coronavirus 2 NEGATIVE NEGATIVE Final    Comment: (NOTE) SARS-CoV-2 target nucleic acids are NOT DETECTED. The SARS-CoV-2 RNA is generally detectable in upper and lower respiratory specimens during the acute phase of infection. Negative results do not preclude SARS-CoV-2 infection, do not rule out co-infections with other pathogens, and should not be used as the sole basis for treatment or other patient management decisions. Negative results must be combined with clinical observations, patient history, and epidemiological information. The expected result is Negative. Fact Sheet for Patients: SugarRoll.be Fact Sheet for Healthcare Providers: https://www.woods-mathews.com/ This test is not yet approved or cleared by the [place] FDA and  has been authorized for detection and/or diagnosis of SARS-CoV-2 by FDA under an Emergency Use Authorization (EUA). This EUA will remain  in effect (meaning this test can be used) for the duration  of the COVID-19 declaration  under Section 56 4(b)(1) of the Act, 21 U.S.C. section 360bbb-3(b)(1), unless the authorization is terminated or revoked sooner. Performed at [facility], [place] [address] [place], [place] [address]      Labs: BNP (last 3 results) Recent Labs    02/11/18 0340  BNP [id number]   Basic Metabolic Panel: Recent Labs  Lab 10/31/18 2209 11/01/18 1640  NA 139  --   K 4.2  --   CL 105  --   CO2 27  --   GLUCOSE 116*  --   BUN 27*  --   CREATININE 1.16* 0.99  CALCIUM 10.6*  --    Liver Function Tests: Recent Labs  Lab 10/31/18 2209  AST 20  ALT 13  ALKPHOS 40  BILITOT 0.6  PROT 6.0*  ALBUMIN 3.7   No results for input(s): LIPASE, AMYLASE in the last 168 hours. No results for input(s): AMMONIA in the last 168 hours. CBC: Recent Labs  Lab 10/31/18 2209 11/01/18 1640  WBC 4.6 5.2  NEUTROABS 2.2  --   HGB 13.1 14.6  HCT 42.6 44.8  MCV 99.3 98.0  PLT 81* PLATELET CLUMPS NOTED ON SMEAR, UNABLE TO ESTIMATE   Cardiac Enzymes: No results for input(s): CKTOTAL, CKMB, CKMBINDEX, TROPONINI in the last 168 hours. BNP: Invalid input(s): POCBNP CBG: Recent Labs  Lab 10/31/18 2124  GLUCAP 127*   D-Dimer No results for input(s): DDIMER in the last 72 hours. Hgb A1c No results for input(s): HGBA1C in the last 72 hours. Lipid Profile Recent Labs    11/02/18 0327  CHOL 160  HDL 63  LDLCALC 82  TRIG 73  CHOLHDL 2.5   Thyroid function studies No results for input(s): TSH, T4TOTAL, T3FREE, THYROIDAB in the last 72 hours.  Invalid input(s): FREET3 Anemia work up No results for input(s): VITAMINB12, FOLATE, FERRITIN, TIBC, IRON, RETICCTPCT in the last 72 hours. Urinalysis    Component Value Date/Time   COLORURINE YELLOW 10/31/2018 2311   APPEARANCEUR HAZY (A) 10/31/2018 2311   LABSPEC >1.030 (H) 10/31/2018 2311   PHURINE 5.0 10/31/2018 2311   GLUCOSEU NEGATIVE 10/31/2018 2311   HGBUR TRACE (A) 10/31/2018 2311   BILIRUBINUR  NEGATIVE 10/31/2018 2311   KETONESUR NEGATIVE 10/31/2018 2311   PROTEINUR NEGATIVE 10/31/2018 2311   UROBILINOGEN 0.2 04/17/2009 1759   NITRITE NEGATIVE 10/31/2018 2311   LEUKOCYTESUR NEGATIVE 10/31/2018 2311   Sepsis Labs Invalid input(s): PROCALCITONIN,  WBC,  LACTICIDVEN Microbiology Recent Results (from the past 240 hour(s))  SARS CORONAVIRUS 2 (TAT 6-24 HRS) Nasopharyngeal Nasopharyngeal Swab     Status: None   Collection Time: 10/31/18 11:11 PM   Specimen: Nasopharyngeal Swab  Result Value Ref Range Status   SARS Coronavirus 2 NEGATIVE NEGATIVE Final    Comment: (NOTE) SARS-CoV-2 target nucleic acids are NOT DETECTED. The SARS-CoV-2 RNA is generally detectable in upper and lower respiratory specimens during the acute phase of infection. Negative results do not preclude SARS-CoV-2 infection, do not rule out co-infections with other pathogens, and should not be used as the sole basis for treatment or other patient management decisions. Negative results must be combined with clinical observations, patient history, and epidemiological information. The expected result is Negative. Fact Sheet for Patients: SugarRoll.be Fact Sheet for Healthcare Providers: https://www.woods-mathews.com/ This test is not yet approved or cleared by the [place] FDA and  has been authorized for detection and/or diagnosis of SARS-CoV-2 by FDA under an Emergency Use Authorization (EUA). This EUA will remain  in effect (meaning this  test can be used) for the duration of the COVID-19 declaration under Section 56 4(b)(1) of the Act, 21 U.S.C. section 360bbb-3(b)(1), unless the authorization is terminated or revoked sooner. Performed at [facility], [place] [address] [place], [place] [address]      Time coordinating discharge in minutes: 16  SIGNED:   Debbe Odea, MD  [facility] 11/02/2018, 3:38 PM Pager   If 7PM-7AM, please contact  night-coverage [email or web address] Password [email or web address]

## 2018-11-02 NOTE — Discharge Instructions (Addendum)
[name] has Alzheimer's dementia which will most likely get worse and resulted in increasing confusion and possibly agitation and argumentative behavior.  Please follow-up closely with her PCP or her neurologist to continue to help manage her confusion holding from Alzheimer's dementia.   I recommend that she be weaned off of Xanax and placed on more appropriate medication for dementia.    Information on my medicine - ELIQUIS (apixaban)  This medication education was reviewed with me or my healthcare representative as part of my discharge preparation.  Why was Eliquis prescribed for you? Eliquis was prescribed for you to reduce the risk of a blood clot forming that can cause a stroke if you have a medical condition called atrial fibrillation (a type of irregular heartbeat).  What do You need to know about Eliquis ? Take your Eliquis TWICE DAILY - one tablet in the morning and one tablet in the evening with or without food. If you have difficulty swallowing the tablet whole please discuss with your pharmacist how to take the medication safely.  Take Eliquis exactly as prescribed by your doctor and DO NOT stop taking Eliquis without talking to the doctor who prescribed the medication.  Stopping may increase your risk of developing a stroke.  Refill your prescription before you run out.  After discharge, you should have regular check-up appointments with your healthcare provider that is prescribing your Eliquis.  In the future your dose may need to be changed if your kidney function or weight changes by a significant amount or as you get older.  What do you do if you miss a dose? If you miss a dose, take it as soon as you remember on the same day and resume taking twice daily.  Do not take more than one dose of ELIQUIS at the same time to make up a missed dose.  Important Safety Information A possible side effect of Eliquis is bleeding. You should call your healthcare provider right  away if you experience any of the following: ? Bleeding from an injury or your nose that does not stop. ? Unusual colored urine (red or dark brown) or unusual colored stools (red or black). ? Unusual bruising for unknown reasons. ? A serious fall or if you hit your head (even if there is no bleeding).  Some medicines may interact with Eliquis and might increase your risk of bleeding or clotting while on Eliquis. To help avoid this, consult your healthcare provider or pharmacist prior to using any new prescription or non-prescription medications, including herbals, vitamins, non-steroidal anti-inflammatory drugs (NSAIDs) and supplements.  This website has more information on Eliquis (apixaban): http://www.eliquis.com/eliquis/home

## 2018-11-03 LAB — HEMOGLOBIN A1C
Hgb A1c MFr Bld: 5.2 % (ref 4.8–5.6)
Mean Plasma Glucose: 103 mg/dL

## 2018-11-06 NOTE — Telephone Encounter (Signed)
Spoke with the patient's daughter per the dpr. The patient has not been on the Azor. Her blood pressure yesterday taken by the home health nurse was 130/75. This was the only reading she was able to give.

## 2018-11-06 NOTE — Telephone Encounter (Signed)
The daughter has been made aware and verbalized her understanding.

## 2018-11-06 NOTE — Telephone Encounter (Signed)
Fax received from [facility]. The patient was not on Azor while at [facility]. The medication list will be given to the provider.

## 2018-11-06 NOTE — Telephone Encounter (Signed)
Can you please call the patient for me? She was admitted late last week for stroke and DC'd home on the Azor - they did not bother to get the [facility] from [facility]. She should probably not be on it. Please find out if she is taking it and what her BP is.

## 2018-11-06 NOTE — Telephone Encounter (Signed)
Yes: stop the Azor and please check BP at least 3 times a week, let us know if SBP >150 or DBP >90

## 2018-11-12 ENCOUNTER — Other Ambulatory Visit: Payer: Self-pay | Admitting: Cardiovascular Disease

## 2018-11-29 ENCOUNTER — Ambulatory Visit (INDEPENDENT_AMBULATORY_CARE_PROVIDER_SITE_OTHER): Payer: Medicare Other | Admitting: *Deleted

## 2018-11-29 DIAGNOSIS — I4821 Permanent atrial fibrillation: Secondary | ICD-10-CM

## 2018-11-29 DIAGNOSIS — I495 Sick sinus syndrome: Secondary | ICD-10-CM | POA: Diagnosis not present

## 2018-11-29 LAB — CUP PACEART REMOTE DEVICE CHECK
Battery Remaining Longevity: 129 mo
Battery Voltage: 3.19 V
Brady Statistic AP VP Percent: 0 %
Brady Statistic AP VS Percent: 0 %
Brady Statistic AS VP Percent: 48.58 %
Brady Statistic AS VS Percent: 51.42 %
Brady Statistic RA Percent Paced: 0 %
Brady Statistic RV Percent Paced: 48.58 %
Date Time Interrogation Session: 20201015034215
Implantable Lead Implant Date: 20090112
Implantable Lead Implant Date: 20090112
Implantable Lead Location: 753859
Implantable Lead Location: 753860
Implantable Lead Model: 4092
Implantable Lead Model: 5076
Implantable Pulse Generator Implant Date: 20200706
Lead Channel Impedance Value: 342 Ohm
Lead Channel Impedance Value: 399 Ohm
Lead Channel Impedance Value: 532 Ohm
Lead Channel Pacing Threshold Amplitude: 1.875 V
Lead Channel Pacing Threshold Pulse Width: 0.4 ms
Lead Channel Sensing Intrinsic Amplitude: 0.875 mV
Lead Channel Sensing Intrinsic Amplitude: 4.75 mV
Lead Channel Setting Pacing Amplitude: 3.75 V
Lead Channel Setting Pacing Pulse Width: 0.4 ms
Lead Channel Setting Sensing Sensitivity: 1.2 mV

## 2018-12-03 ENCOUNTER — Encounter: Payer: Self-pay | Admitting: Cardiovascular Disease

## 2018-12-03 ENCOUNTER — Telehealth (INDEPENDENT_AMBULATORY_CARE_PROVIDER_SITE_OTHER): Payer: Medicare Other | Admitting: Cardiovascular Disease

## 2018-12-03 VITALS — BP 140/70 | Ht 63.0 in | Wt 151.0 lb

## 2018-12-03 DIAGNOSIS — I4821 Permanent atrial fibrillation: Secondary | ICD-10-CM

## 2018-12-03 DIAGNOSIS — I5032 Chronic diastolic (congestive) heart failure: Secondary | ICD-10-CM

## 2018-12-03 DIAGNOSIS — Z950 Presence of cardiac pacemaker: Secondary | ICD-10-CM | POA: Diagnosis not present

## 2018-12-03 DIAGNOSIS — Z953 Presence of xenogenic heart valve: Secondary | ICD-10-CM

## 2018-12-03 DIAGNOSIS — G309 Alzheimer's disease, unspecified: Secondary | ICD-10-CM | POA: Diagnosis not present

## 2018-12-03 DIAGNOSIS — I342 Nonrheumatic mitral (valve) stenosis: Secondary | ICD-10-CM

## 2018-12-03 DIAGNOSIS — I4891 Unspecified atrial fibrillation: Secondary | ICD-10-CM

## 2018-12-03 DIAGNOSIS — I10 Essential (primary) hypertension: Secondary | ICD-10-CM

## 2018-12-03 DIAGNOSIS — F0281 Dementia in other diseases classified elsewhere with behavioral disturbance: Secondary | ICD-10-CM

## 2018-12-03 NOTE — Progress Notes (Signed)
Virtual Visit via Telephone Note   This visit type was conducted due to national recommendations for restrictions regarding the COVID-19 Pandemic (e.g. social distancing) in an effort to limit this patient's exposure and mitigate transmission in our community.  Due to her co-morbid illnesses, this patient is at least at moderate risk for complications without adequate follow up.  This format is felt to be most appropriate for this patient at this time.  The patient did not have access to video technology/had technical difficulties with video requiring transitioning to audio format only (telephone).  All issues noted in this document were discussed and addressed.  No physical exam could be performed with this format.  Please refer to the patient's chart for her  consent to telehealth for [name].   Date:  12/05/2018   ID:  [name], DOB 1929-06-14, MRN [id number]  Patient Location: Home Provider Location: Home  PCP:  Lawerance Cruel, MD  Cardiologist:  Sanda Klein, MD  Electrophysiologist:  None   Evaluation Performed:  Follow-Up Visit  Chief Complaint:  Pacemaker ERI  History of Present Illness:    [name] is a 85 y.o. female with with long-standing atrial fibrillation with slow ventricular response and permanent pacemaker (recent generator change July 2020, Medtronic), systemic hypertension and hyperlipidemia, chronic diastolic heart failure and a biological mitral valve prosthesis (2011, 29 mm Biocor, Dr. Prescott Gum, with left atrial appendage ligation).  For a long time she was not receiving anticoagulants due to a perceived increased risk of bleeding due to frequent falls.  On July 22 she underwent a TEE to evaluate for bacteremia and look at her mitral valve, which incidentally showed large mobile thrombi in the left atrium.  She was subsequently started on anticoagulants.  She was hospitalized with transient aphasia on September 16.  She has moderate  prosthetic mitral valve stenosis (mean gradient 9 mmHg at 75 bpm).   She is not pacemaker dependent.  Her last device download just a couple of days ago showed roughly 49% ventricular pacing.    Rate control is generally good, with occasional episodes of high ventricular rates that appear to be irregular, likely atrial fibrillation rapid ventricular response.  Possibly a couple of episodes of true nonsustained VT, very brief.  Her device is programmed VVIR for what is now permanent atrial fibrillation, but she has both atrial and ventricular leads in place.  She has been home now for about 4 weeks.  The patient specifically denies any chest pain at rest exertion, dyspnea at rest or with exertion, orthopnea, paroxysmal nocturnal dyspnea, syncope, palpitations, focal neurological deficits, intermittent claudication, lower extremity edema, unexplained weight gain, cough, hemoptysis or wheezing.  Has not had any bleeding problems.  She has not had any falls but has had a couple of close calls when she tries to sit down in a chair.  Her son reports that she is been a little weak recently and is often hunched over and sometimes seems a little sleepy.  Her oral intake has not been great.  The patient does not have symptoms concerning for COVID-19 infection (fever, chills, cough, or new shortness of breath).    Past Medical History:  Diagnosis Date  . Anxiety   . Arthritis    "mostly my hands" (01/29/2015)  . Basal cell carcinoma of forehead   . Benign essential tremor   . CAP (community acquired pneumonia) 01/29/2015  . CHF (congestive heart failure) ([place])   . Dementia ([place])  10/18/2018  . Depression   . Edema of both legs 04-23-2013  pt states feet swelling (wears ted hose)   bilateral venous ablation procedures done in IR, L>R  . GERD (gastroesophageal reflux disease)   . H/O hiatal hernia   . Hyperlipidemia   . Hyperparathyroidism ([place])    MILD--  STABLE  . Hypertension   . Kidney stones    . Osteopenia   . Permanent atrial fibrillation ([place])    CARDIOLOGIST-- DR [id number]  . Pneumonia "several times"  . Presence of permanent cardiac pacemaker    LAST PACER CHECK  04-23-2013  IN [facility]  . Right ureteral stone   . S/P mitral valve replacement with bioprosthetic valve    04-03-2009  . Schatzki's ring   . Sick sinus syndrome ([place])    S/P  PACEMAKER  2009  . Stroke ([place]) 08/2018  . Stroke ([place])   . Thrombocytopenia, immune ([place])    CHRONIC---  HEMOTOLOGIST---  DR ENNEVER  . Venous insufficiency, peripheral   . Wears glasses    Past Surgical History:  Procedure Laterality Date  . CARDIAC CATHETERIZATION  03/27/2009   normal coronaries; grade IV Mitral Insuff.  Marland Kitchen CARDIAC CATHETERIZATION  2001  &  2006  . CARDIAC VALVE REPLACEMENT    . CATARACT EXTRACTION W/ INTRAOCULAR LENS  IMPLANT, BILATERAL Bilateral ~ 2012  . CYSTOSCOPY W/ URETERAL STENT PLACEMENT Left 08/01/2012   Procedure: CYSTOSCOPY WITH RETROGRADE PYELOGRAM/URETERAL STENT PLACEMENT;  Surgeon: Dutch Gray, MD;  Location: [facility];  Service: Urology;  Laterality: Left;  . CYSTOSCOPY WITH URETEROSCOPY Left 08/14/2012   Procedure: LEFT URETEROSCOPY WITH HOLMIUM LASER AND LEFT  STENT PLACEMENT;  Surgeon: Malka So, MD;  Location: [facility];  Service: Urology;  Laterality: Left;  . CYSTOSCOPY WITH URETEROSCOPY AND STENT PLACEMENT Right 04/25/2013   Procedure: RIGHT URETEROSCOPY WITH  STENT PLACEMENT;  Surgeon: Irine Seal, MD;  Location: [facility];  Service: Urology;  Laterality: Right;  . ESOPHAGOGASTRODUODENOSCOPY (EGD) WITH ESOPHAGEAL DILATION  "a couple times" (01/29/2015)  . EXTRACORPOREAL SHOCK WAVE LITHOTRIPSY Left 07-26-2012  . HAND SURGERY Right 05/2011   "no fracture; straightened things up  . HAND SURGERY Left 2014   "no fracture; straightened things up"  . HOLMIUM LASER APPLICATION Left [id number]   Procedure:  HOLMIUM LASER APPLICATION;  Surgeon: Malka So, MD;  Location: [facility];  Service: Urology;   Laterality: Left;  . HOLMIUM LASER APPLICATION Right [id number]   Procedure: HOLMIUM LASER APPLICATION;  Surgeon: Irine Seal, MD;  Location: [facility];  Service: Urology;  Laterality: Right;  . MITRAL VALVE REPLACEMENT WITH CHORDAL PRESEVATION USING BIOPROSTHETIC VALVE/  LIGATION LEFT ATRIAL APPENDAGE/  LEFT-SIDED MAZE PROCEDURE  04-03-2009  DR Ethel Rana,  SERIAL # [id number]  . NM MYOCAR PERF WALL MOTION  03/01/1999   mild lateral ischemia  . PERMANENT PACEMAKER INSERTION  02/26/2007   Dual Chamber Medtronic Programmed VVIR--- Generator [id number]/  [id number]   . PPM GENERATOR CHANGEOUT N/A 08/20/2018   Procedure: PPM GENERATOR CHANGEOUT;  Surgeon: Sanda Klein, MD;  Location: [place] [facility];  Service: Cardiovascular;  Laterality: N/A;  . TEE WITHOUT CARDIOVERSION N/A 09/05/2018   Procedure: TRANSESOPHAGEAL ECHOCARDIOGRAM (TEE);  Surgeon: Pixie Casino, MD;  Location: [facility];  Service: Cardiovascular;  Laterality: N/A;  . TRANSTHORACIC ECHOCARDIOGRAM  09-07-2012  DR Ramatoulaye Pack   MILD LVH/  EF 55-60%/  MILD TO MODERATE AR/   SEVERE LAE  &  RAE/  MILD TO MODERATE TR/   MITRAL VALVE BIOPROSTHESIS LEAFLET NORMAL,  MODERATE STENOSIS WITH NO SIG. REGURG/  MODERATE RVE  . VAGINAL HYSTERECTOMY  1982     Current Meds  Medication Sig  . acetaminophen (TYLENOL) 500 MG tablet Take 500-1,000 mg by mouth daily as needed for moderate pain or headache.   . ALPRAZolam (XANAX) 0.5 MG tablet Take 0.5-1 tablets (0.25-0.5 mg total) by mouth See admin instructions. Take 1/2 tablet every morning and take 1 tablet at night  . Ascorbic Acid (VITAMIN C PO) Take 1 tablet by mouth daily.  . B Complex Vitamins (VITAMIN B COMPLEX PO) Take 1 tablet by mouth daily.   . cholecalciferol (VITAMIN D3) 25 MCG (1000 UT) tablet Take 1,000 Units by mouth daily.  . Melatonin 10 MG TABS Take 1 tablet by mouth at bedtime.  Marland Kitchen omeprazole (PRILOSEC) 20 MG capsule Take 20 mg by mouth daily.   Marland Kitchen OVER  THE COUNTER MEDICATION Take 1 tablet by mouth 2 (two) times daily. Nutrifuron (immune supplement)  . Probiotic CAPS Take 1 capsule by mouth daily.  . simvastatin (ZOCOR) 10 MG tablet TAKE 1 TABLET BY MOUTH EVERY DAY AT DINNER (Patient taking differently: Take 10 mg by mouth at bedtime. )  . Vit B6-Vit B12-Omega 3 Acids (VITAMIN B PLUS+ PO) Take 1 tablet by mouth daily.  . [DISCONTINUED] amLODipine-olmesartan (AZOR) 5-20 MG tablet TAKE 1 TABLET BY MOUTH EVERY DAY  . [DISCONTINUED] furosemide (LASIX) 40 MG tablet Take 40 mg by mouth as directed. Every other day and as needed for weight gain  . [DISCONTINUED] potassium chloride SA (K-DUR) 20 MEQ tablet Take 20 mEq by mouth as directed. Every other day and days you take extra Lasix     Allergies:   Zoloft [sertraline hcl]   Social History   Tobacco Use  . Smoking status: Never Smoker  . Smokeless tobacco: Never Used  . Tobacco comment: never used tobacco  Substance Use Topics  . Alcohol use: No    Alcohol/week: 0.0 standard drinks  . Drug use: No     Family Hx: The patient's family history includes Cancer in her mother; Heart attack in her father.  ROS:   Please see the history of present illness.     All other systems are reviewed and are negative Prior CV studies:   The following studies were reviewed today:  Pacemaker download November 29, 2018  Labs/Other Tests and Data Reviewed:    EKG:  Intracardiac electrogram shows ventricular sensed rhythm, irregular consistent with atrial fibrillation Recent Labs: 02/11/2018: B Natriuretic Peptide 79.2 10/31/2018: ALT 13; BUN 27; Potassium 4.2; Sodium 139 11/01/2018: Creatinine, Ser 0.99; Hemoglobin 14.6; Platelets PLATELET CLUMPS NOTED ON SMEAR, UNABLE TO ESTIMATE   Recent Lipid Panel Lab Results  Component Value Date/Time   CHOL 160 11/02/2018 03:27 AM   TRIG 73 11/02/2018 03:27 AM   HDL 63 11/02/2018 03:27 AM   CHOLHDL 2.5 11/02/2018 03:27 AM   LDLCALC 82 11/02/2018 03:27 AM     Wt Readings from Last 3 Encounters:  12/03/18 151 lb (68.5 kg)  11/01/18 159 lb 2.8 oz (72.2 kg)  09/10/18 159 lb 2.8 oz (72.2 kg)     Objective:    Vital Signs:  BP 140/70 Comment: last taken 10/14  Ht 5\' 3"  (1.6 m)   Wt 151 lb (68.5 kg) Comment: taken 10/18  BMI 26.75 kg/m    VITAL SIGNS:  reviewed Unable to examine  ASSESSMENT & PLAN:    1.  AFib: She has a dual-chamber pacemaker implanted initially for tachycardia-bradycardia syndrome but is now in permanent atrial fibrillation with a device programmed VVIR.  Anticoagulants were started after a transesophageal echocardiogram incidentally showed a couple of very large mobile thrombi in the left atrium.  Not long thereafter she had an ischemic stroke.  She remains a fall risk. despite high embolic risk she is not on anticoagulants due to frequent falls, serious injuries and thrombocytopenia.  She is not receiving any AV blocking agents.  Rate control is maintained spontaneously with rare episodes of RVR, roughly 50% vent ricular pacing. 2. PM : Recent generator change, normal device function, continue remote downloads every 3 months. 3. S/P bioMVR: moderately elevated gradients, otherwise normal prosthetic valve function at last check.  She is not a good candidate for redo surgery.  She is quite sedentary and will hopefully not have a lot of symptoms from the mitral stenosis. 4. CHF: She is roughly 8 pounds lighter than when I last saw her.  I wonder if she is a little excessively diuresed, which explains her weakness.  We will ask her to stop her daily doses of furosemide and potassium and only administer them as needed, on days when her weight reaches 155 pounds or higher. 5. HTN: good blood pressure control.    COVID-19 Education: The signs and symptoms of COVID-19 were discussed with the patient and how to seek care for testing (follow up with PCP or arrange E-visit).  The importance of social distancing was discussed today.   Time:   Today, I have spent 25 minutes with the patient with telehealth technology discussing the above problems.     Medication Adjustments/Labs and Tests Ordered: Current medicines are reviewed at length with the patient today.  Concerns regarding medicines are outlined above.   Tests Ordered: No orders of the defined types were placed in this encounter.   Medication Changes: No orders of the defined types were placed in this encounter.   Follow Up: Virtual wound check visit 10-14 days after [employer] ; Virtual Visit 3 months after pacemaker [employer], Sanda Klein, MD  12/05/2018 4:27 PM    [place]

## 2018-12-03 NOTE — Patient Instructions (Addendum)
Medication Instructions:  STOP YOUR FUROSEMIDE  STOP POTASSIUM CHLORIDE  *If you need a refill on your cardiac medications before your next appointment, please call your pharmacy*  Lab Work: NONE   Testing/Procedures: NONE   Follow-Up: At [facility], you and your health needs are our priority.  As part of our continuing mission to provide you with exceptional heart care, we have created designated Provider Care Teams.  These Care Teams include your primary Cardiologist (physician) and Advanced Practice Providers (APPs -  Physician Assistants and Nurse Practitioners) who all work together to provide you with the care you need, when you need it.  Your next appointment:   6 months  You will receive a reminder letter in the mail two months in advance. If you don't receive a letter, please call our office to schedule the follow-up appointment.   The format for your next appointment:   Either In Person or Virtual  Provider:   You may see Sanda Klein, MD   or one of the following Advanced Practice Providers on your designated Care Team:    Almyra Deforest, PA-C  Fabian Sharp, PA-C or   Roby Lofts, PA-C  Other Instructions  MONITOR YOU WEIGHT AT [place] UP TO [place]

## 2018-12-13 NOTE — Progress Notes (Signed)
Remote pacemaker transmission.   

## 2018-12-17 ENCOUNTER — Telehealth: Payer: Self-pay | Admitting: Cardiovascular Disease

## 2018-12-17 ENCOUNTER — Other Ambulatory Visit: Payer: Self-pay

## 2018-12-17 NOTE — Telephone Encounter (Signed)
Okay, it seems that the system we set in place is working reasonably well.  Keep the same plan, take furosemide only as needed, on any day that the weight is 155 pounds or higher.

## 2018-12-17 NOTE — Telephone Encounter (Signed)
Spoke to pt son. Gave Dr advice with lasix. Verbalized understanding. Says that pt is still swollen on left ankle and left leg. Pt son is also concerned because they took the potassium pill with the lasix and the pt is having a hard time swallowing it. States the pt gets sick trying to take it so he cuts the pill into tiny pieces. He wants to know if there is something smaller or in liquid form. Will forward to Dr C and pharmacy for review.

## 2018-12-17 NOTE — Telephone Encounter (Signed)
Pt c/o swelling: STAT is pt has developed SOB within 24 hours  1) How much weight have you gained and in what time span? Gained about 5 pounds in 5 days  2) If swelling, where is the swelling located? Her left ankle and leg  3) Are you currently taking a fluid pill? Gave her lasix and cholecalciferol (VITAMIN D3) 25 MCG (1000 UT) tablet  4) Are you currently SOB? no  5) Do you have a log of your daily weights (if so, list)?   10/23 150.8  10/26 153.4  10/27 153.6  10/28 154.2  10/30 154.8  10/31 152.4  11/1 155.6  11/2 151.2 6) Have you gained 3 pounds in a day or 5 pounds in a week? Yes 5 pounds in a week.  7) Have you traveled recently? no  Patients son states that she was told to get off of lasix via her telehealth visit on 10/19 and if her weight got to 155 to let Dr. Sallyanne Kuster know. She got to 155.6 yesterday but has gone back down to 151 today because she took a lasix.

## 2018-12-17 NOTE — Telephone Encounter (Signed)
LM2CB 

## 2018-12-17 NOTE — Telephone Encounter (Signed)
Left message with Dr Lurline Del advice.

## 2018-12-17 NOTE — Telephone Encounter (Signed)
It does come in a liquid formulation, but taste is 'horrible".  Would suggest family check with pharmacy about size of 3mEq tabs and see if that is something she can swallow.  If so, change rx to 10 mEq, 2 tablets/dose.

## 2018-12-17 NOTE — Telephone Encounter (Signed)
I agree w Erasmo Downer the liquid form causes even more nausea. They make 8 mEq caplets (KLor-Con) and we can Rx them (they tend to cost more).

## 2018-12-18 MED ORDER — FUROSEMIDE 20 MG PO TABS: ORAL_TABLET | ORAL | 3 refills | Status: DC

## 2018-12-18 MED ORDER — POTASSIUM CHLORIDE ER 8 MEQ PO TBCR: EXTENDED_RELEASE_TABLET | ORAL | 2 refills | Status: DC

## 2018-12-18 MED ORDER — FUROSEMIDE 40 MG PO TABS: ORAL_TABLET | ORAL | Status: DC

## 2018-12-18 NOTE — Telephone Encounter (Signed)
Follow up   Patient son is returning call.

## 2018-12-18 NOTE — Telephone Encounter (Signed)
Follow up  ° ° °Patient is returning call.  °

## 2018-12-18 NOTE — Addendum Note (Signed)
Addended by: Ricci Barker on: 12/18/2018 04:37 PM   Modules accepted: Orders

## 2018-12-18 NOTE — Addendum Note (Signed)
Addended by: Ricci Barker on: 12/18/2018 03:21 PM   Modules accepted: Orders

## 2018-12-18 NOTE — Telephone Encounter (Signed)
Returned the call to the patient's son and daughter, per the dpr.  Per Dr. Sallyanne Kuster, they can try the potassium (Klor-con) 24 mEq (3 of the 8 mEq tablets) as needed with the Furosemide.  They have verbalized their understanding and the prescription has been sent in.  The daughter stated that the patient has a rash on her arm and more bruising then normal. She stated that the patient has not started any other new medications or food and denies any other symptoms. She has an appointment tomorrow with her PCP. She has been advised to have the PCP look at her arm. She has verbalized her understanding.

## 2018-12-18 NOTE — Telephone Encounter (Signed)
Left a message for the patient to call back.  

## 2018-12-18 NOTE — Telephone Encounter (Signed)
Patient returning your call.

## 2018-12-18 NOTE — Addendum Note (Signed)
Addended by: Ricci Barker on: 12/18/2018 04:42 PM   Modules accepted: Orders

## 2018-12-18 NOTE — Telephone Encounter (Signed)
Called patient's son (DPR) back. Informed him of Dr. Victorino December recommendations. He understands this, but he is stating that patient's weight is 154.6 lbs today and her left leg has some swelling. Patient's son stated they are going to keep doing Lasix 40 mg daily until her swelling has gone down. Also, while they are still taking Lasix, patient was taking potassium as well, but has trouble with the larger pills. Patient would like smaller pills, 10 meq tablets or liquid form to take with her lasix. Lasix and potassium are not on patient's medication list. Will send message to Dr. Sallyanne Kuster for advisement and for new order for lasix and potassium if patient is going to continue to take.

## 2018-12-19 NOTE — Telephone Encounter (Signed)
OK. I think [name] has sent in new Rx.

## 2018-12-26 ENCOUNTER — Other Ambulatory Visit: Payer: Self-pay | Admitting: Cardiovascular Disease

## 2018-12-26 NOTE — Telephone Encounter (Signed)
°*  STAT* If patient is at the pharmacy, call can be transferred to refill team.   1. Which medications need to be refilled? (please list name of each medication and dose if known) apixaban (ELIQUIS) 5 MG TABS tablet  2. Which pharmacy/location (including street and city if local pharmacy) is medication to be sent to? [facility] [address]  3. Do they need a 30 day or 90 day supply? 30 day  Patient only has two pills left

## 2018-12-27 ENCOUNTER — Telehealth: Payer: Self-pay | Admitting: Cardiovascular Disease

## 2018-12-27 MED ORDER — APIXABAN 5 MG PO TABS: 5.0000 mg | ORAL_TABLET | Freq: Two times a day (BID) | ORAL | 0 refills | Status: DC

## 2018-12-27 NOTE — Telephone Encounter (Signed)
Those readings are not immediately dangerous. Will wait for the one week log.

## 2018-12-27 NOTE — Telephone Encounter (Signed)
° °  Pt c/o BP issue: STAT if pt c/o blurred vision, one-sided weakness or slurred speech  1. What are your last 5 BP readings?  158/82 168/85  2. Are you having any other symptoms (ex. Dizziness, headache, blurred vision, passed out)? no  3. What is your BP issue? Home Health PT called to report some high BP for the patient . Son has been calling in the past to monitor, and the service is required to notify the doctor when they take an elevated reading.   Contact the son if any adjustments in medication need to be made

## 2018-12-27 NOTE — Telephone Encounter (Signed)
Returned call to [employer]) he states that pt BP has been high today 158/82 and 168/85 these were taken by the physical therapist today. He states that he does not have a BP cuff but will go purchase one and start taking pt's BP daily, he will get arm cuff for accurate readings. Explained BP directions to son, verbalized understanding.Pt does not take any BP medication. Takes all other medications as directed. He will take pt's BP BID for a week and call us back with the readings. He will call back if BP >160 for further direction.

## 2018-12-30 ENCOUNTER — Other Ambulatory Visit: Payer: Self-pay | Admitting: Cardiovascular Disease

## 2018-12-31 NOTE — Telephone Encounter (Signed)
Noted. Agreement with plan.

## 2019-01-14 ENCOUNTER — Telehealth: Payer: Self-pay | Admitting: Cardiovascular Disease

## 2019-01-14 NOTE — Telephone Encounter (Signed)
New Message    Pt c/o swelling: STAT is pt has developed SOB within 24 hours  1) How much weight have you gained and in what time span? Has gained no weight   2) If swelling, where is the swelling located? Feet and ankles   3) Are you currently taking a fluid pill? Has taken Lasix   4) Are you currently SOB? No   5) Do you have a log of your daily weights (if so, list)? Yes   6) Have you gained 3 pounds in a day or 5 pounds in a week?  No   7) Have you traveled recently? No     Jeneen Rinks is calling and says the pt is having some swelling that is not going away. He is also calling to leave updated BP readings   11/15 160/90 11/17 153/92 11/23 160/92 11/24 158/89 11/28 159/82

## 2019-01-14 NOTE — Telephone Encounter (Signed)
Spoke with pt's son who report for the past 3 days pt has been taking lasix daily due to swelling in feet and ankle. He state pt denies SOB or increased weight gain. Son report swelling has decreased some but worried since it hasn't resolved completely. Son also wanted to make MD aware that pt's BP has still remained elevated. Readings listed below.  11/15 160/90 11/17 153/92 11/23 160/92 11/24 158/89 11/28 159/82   Will route to MD for recommendations.

## 2019-01-15 MED ORDER — OLMESARTAN MEDOXOMIL 20 MG PO TABS: 20.0000 mg | ORAL_TABLET | Freq: Every day | ORAL | 11 refills | Status: AC

## 2019-01-15 NOTE — Telephone Encounter (Signed)
Left message to call back  

## 2019-01-15 NOTE — Telephone Encounter (Signed)
Son updated and verbalized understanding. New orders placed.

## 2019-01-15 NOTE — Telephone Encounter (Signed)
Please restart olmesartan 20 mg daily (this is one of the ingredients in her previous BP medication and may help assist with reducing the swelling). We are not restarting the amlodipine component, which could worsen the edema. Continue the furosemide. Please let us know how BP is going after about a week on the olmesartan.

## 2019-01-22 ENCOUNTER — Other Ambulatory Visit: Payer: Self-pay | Admitting: Cardiovascular Disease

## 2019-01-22 ENCOUNTER — Telehealth: Payer: Self-pay | Admitting: Cardiovascular Disease

## 2019-01-22 NOTE — Telephone Encounter (Signed)
New Message:   Pt have lost 10 lbs since 01-11-19. Son is very concerned about this, please call.

## 2019-01-22 NOTE — Telephone Encounter (Signed)
Pt's son calling re 10 # weight loss since 01/14/19 This is when pt restarted Furosemide 40 mg qd Weight on 01/11/19 was 151.2 and today is 141.4 Per son diet has not changed and pt's left ankle remains swollen not as bad as last week and also therapists came by and did walking exercise and today was 1 lap less than previous due to SOB Pt's B/p today was 136/82 and HR 71 Will forward to Dr Sallyanne Kuster for review and recommendations ./cy

## 2019-01-23 NOTE — Telephone Encounter (Signed)
I think a lot of the weight recently lost at a fast pace was just excess water. I believe that she has steadily lost true weight (muscle fat, bone weight) over the last year.

## 2019-01-23 NOTE — Telephone Encounter (Signed)
LM for son to call back

## 2019-01-23 NOTE — Telephone Encounter (Signed)
Son notified of advice per MD. Voiced understanding. He is asking if Dr. Loletha Grayer thinks patient may be dehydrated. He reports she drinks some milk in the morning and 1 bottle of water during the day. No recent labs in epic and none per son from PCP either.   He would like Dr. Lurline Del opinion on why she may have dropped so much weight so fast

## 2019-01-23 NOTE — Telephone Encounter (Signed)
Please stop the daily furosemide and give only intermittently, only on days when weight is 145 lb or higher, please.

## 2019-01-23 NOTE — Telephone Encounter (Signed)
Follow up   Please return call to son with advice

## 2019-01-28 NOTE — Telephone Encounter (Signed)
Left a message for the patient to call back.  

## 2019-01-29 NOTE — Telephone Encounter (Signed)
The patient's son has been made aware and verbalized his understanding.

## 2019-02-23 ENCOUNTER — Other Ambulatory Visit: Payer: Self-pay | Admitting: Cardiovascular Disease

## 2019-02-28 ENCOUNTER — Ambulatory Visit (INDEPENDENT_AMBULATORY_CARE_PROVIDER_SITE_OTHER): Payer: Medicare PPO | Admitting: *Deleted

## 2019-02-28 DIAGNOSIS — I495 Sick sinus syndrome: Secondary | ICD-10-CM | POA: Diagnosis not present

## 2019-03-01 LAB — CUP PACEART REMOTE DEVICE CHECK
Battery Remaining Longevity: 120 mo
Battery Voltage: 3.15 V
Brady Statistic AP VP Percent: 0 %
Brady Statistic AP VS Percent: 0 %
Brady Statistic AS VP Percent: 46.34 %
Brady Statistic AS VS Percent: 53.66 %
Brady Statistic RA Percent Paced: 0 %
Brady Statistic RV Percent Paced: 46.34 %
Date Time Interrogation Session: 20210112224905
Implantable Lead Implant Date: 20090112
Implantable Lead Implant Date: 20090112
Implantable Lead Location: 753859
Implantable Lead Location: 753860
Implantable Lead Model: 4092
Implantable Lead Model: 5076
Implantable Pulse Generator Implant Date: 20200706
Lead Channel Impedance Value: 304 Ohm
Lead Channel Impedance Value: 380 Ohm
Lead Channel Impedance Value: 532 Ohm
Lead Channel Pacing Threshold Amplitude: 2 V
Lead Channel Pacing Threshold Pulse Width: 0.4 ms
Lead Channel Sensing Intrinsic Amplitude: 0.875 mV
Lead Channel Sensing Intrinsic Amplitude: 4.25 mV
Lead Channel Setting Pacing Amplitude: 4 V
Lead Channel Setting Pacing Pulse Width: 0.4 ms
Lead Channel Setting Sensing Sensitivity: 1.2 mV

## 2019-03-01 NOTE — Progress Notes (Signed)
PPM Remote  

## 2019-03-20 ENCOUNTER — Other Ambulatory Visit: Payer: Self-pay | Admitting: Cardiovascular Disease

## 2019-04-15 ENCOUNTER — Other Ambulatory Visit: Payer: Self-pay

## 2019-04-15 ENCOUNTER — Emergency Department (HOSPITAL_COMMUNITY): Payer: Medicare PPO

## 2019-04-15 ENCOUNTER — Inpatient Hospital Stay (HOSPITAL_COMMUNITY)
Admission: EM | Admit: 2019-04-15 | Discharge: 2019-04-18 | DRG: 605 | Disposition: A | Discharge status: Home Health | Payer: Medicare PPO | Attending: Family Medicine | Admitting: Family Medicine

## 2019-04-15 DIAGNOSIS — Y92009 Unspecified place in unspecified non-institutional (private) residence as the place of occurrence of the external cause: Secondary | ICD-10-CM

## 2019-04-15 DIAGNOSIS — Y92002 Bathroom of unspecified non-institutional (private) residence single-family (private) house as the place of occurrence of the external cause: Secondary | ICD-10-CM

## 2019-04-15 DIAGNOSIS — I77810 Thoracic aortic ectasia: Secondary | ICD-10-CM | POA: Diagnosis present

## 2019-04-15 DIAGNOSIS — Z950 Presence of cardiac pacemaker: Secondary | ICD-10-CM

## 2019-04-15 DIAGNOSIS — Z9229 Personal history of other drug therapy: Secondary | ICD-10-CM

## 2019-04-15 DIAGNOSIS — Z79899 Other long term (current) drug therapy: Secondary | ICD-10-CM

## 2019-04-15 DIAGNOSIS — S0990XA Unspecified injury of head, initial encounter: Secondary | ICD-10-CM

## 2019-04-15 DIAGNOSIS — D649 Anemia, unspecified: Secondary | ICD-10-CM | POA: Diagnosis not present

## 2019-04-15 DIAGNOSIS — Z8673 Personal history of transient ischemic attack (TIA), and cerebral infarction without residual deficits: Secondary | ICD-10-CM

## 2019-04-15 DIAGNOSIS — R262 Difficulty in walking, not elsewhere classified: Secondary | ICD-10-CM

## 2019-04-15 DIAGNOSIS — Z20822 Contact with and (suspected) exposure to covid-19: Secondary | ICD-10-CM | POA: Diagnosis present

## 2019-04-15 DIAGNOSIS — I482 Chronic atrial fibrillation, unspecified: Secondary | ICD-10-CM

## 2019-04-15 DIAGNOSIS — Y939 Activity, unspecified: Secondary | ICD-10-CM | POA: Diagnosis not present

## 2019-04-15 DIAGNOSIS — S0003XA Contusion of scalp, initial encounter: Secondary | ICD-10-CM | POA: Diagnosis not present

## 2019-04-15 DIAGNOSIS — Z66 Do not resuscitate: Secondary | ICD-10-CM | POA: Diagnosis present

## 2019-04-15 DIAGNOSIS — Z7901 Long term (current) use of anticoagulants: Secondary | ICD-10-CM

## 2019-04-15 DIAGNOSIS — D696 Thrombocytopenia, unspecified: Secondary | ICD-10-CM

## 2019-04-15 DIAGNOSIS — R269 Unspecified abnormalities of gait and mobility: Secondary | ICD-10-CM | POA: Diagnosis present

## 2019-04-15 DIAGNOSIS — W1830XA Fall on same level, unspecified, initial encounter: Secondary | ICD-10-CM | POA: Diagnosis present

## 2019-04-15 DIAGNOSIS — I10 Essential (primary) hypertension: Secondary | ICD-10-CM

## 2019-04-15 DIAGNOSIS — M25511 Pain in right shoulder: Secondary | ICD-10-CM | POA: Diagnosis present

## 2019-04-15 DIAGNOSIS — N390 Urinary tract infection, site not specified: Secondary | ICD-10-CM | POA: Diagnosis present

## 2019-04-15 DIAGNOSIS — E785 Hyperlipidemia, unspecified: Secondary | ICD-10-CM | POA: Diagnosis present

## 2019-04-15 DIAGNOSIS — Z8672 Personal history of thrombophlebitis: Secondary | ICD-10-CM

## 2019-04-15 DIAGNOSIS — W19XXXA Unspecified fall, initial encounter: Secondary | ICD-10-CM | POA: Diagnosis not present

## 2019-04-15 DIAGNOSIS — B9620 Unspecified Escherichia coli [E. coli] as the cause of diseases classified elsewhere: Secondary | ICD-10-CM | POA: Diagnosis present

## 2019-04-15 DIAGNOSIS — Z86718 Personal history of other venous thrombosis and embolism: Secondary | ICD-10-CM

## 2019-04-15 DIAGNOSIS — M5030 Other cervical disc degeneration, unspecified cervical region: Secondary | ICD-10-CM | POA: Diagnosis present

## 2019-04-15 DIAGNOSIS — Z952 Presence of prosthetic heart valve: Secondary | ICD-10-CM

## 2019-04-15 DIAGNOSIS — E875 Hyperkalemia: Secondary | ICD-10-CM | POA: Diagnosis present

## 2019-04-15 DIAGNOSIS — S8001XA Contusion of right knee, initial encounter: Secondary | ICD-10-CM | POA: Diagnosis present

## 2019-04-15 DIAGNOSIS — I088 Other rheumatic multiple valve diseases: Secondary | ICD-10-CM | POA: Diagnosis present

## 2019-04-15 LAB — COMPREHENSIVE METABOLIC PANEL
ALT: 16 U/L (ref 0–44)
AST: 28 U/L (ref 15–41)
Albumin: 3.5 g/dL (ref 3.5–5.0)
Alkaline Phosphatase: 49 U/L (ref 38–126)
Anion gap: 9 (ref 5–15)
BUN: 10 mg/dL (ref 8–23)
CO2: 28 mmol/L (ref 22–32)
Calcium: 10.3 mg/dL (ref 8.9–10.3)
Chloride: 105 mmol/L (ref 98–111)
Creatinine, Ser: 0.98 mg/dL (ref 0.44–1.00)
GFR calc Af Amer: >60 mL/min (ref >60)
GFR calc non Af Amer: 52 mL/min — ABNORMAL LOW (ref >60)
Glucose, Bld: 96 mg/dL (ref 70–99)
Potassium: 3.8 mmol/L (ref 3.5–5.1)
Sodium: 142 mmol/L (ref 135–145)
Total Bilirubin: 1.3 mg/dL — ABNORMAL HIGH (ref 0.3–1.2)
Total Protein: 5.4 g/dL — ABNORMAL LOW (ref 6.5–8.1)

## 2019-04-15 LAB — URINALYSIS, ROUTINE W REFLEX MICROSCOPIC
Bilirubin Urine: NEGATIVE
Glucose, UA: NEGATIVE mg/dL
Ketones, ur: NEGATIVE mg/dL
Nitrite: POSITIVE — AB
Protein, ur: NEGATIVE mg/dL
Specific Gravity, Urine: 1.015 (ref 1.005–1.030)
pH: 9 — ABNORMAL HIGH (ref 5.0–8.0)

## 2019-04-15 LAB — CBC WITH DIFFERENTIAL/PLATELET
Abs Immature Granulocytes: 0.01 10*3/uL (ref 0.00–0.07)
Basophils Absolute: 0.1 10*3/uL (ref 0.0–0.1)
Basophils Relative: 1 %
Eosinophils Absolute: 0.1 10*3/uL (ref 0.0–0.5)
Eosinophils Relative: 3 %
HCT: 41.8 % (ref 36.0–46.0)
Hemoglobin: 13 g/dL (ref 12.0–15.0)
Immature Granulocytes: 0 %
Lymphocytes Relative: 22 %
Lymphs Abs: 1 10*3/uL (ref 0.7–4.0)
MCH: 30.4 pg (ref 26.0–34.0)
MCHC: 31.1 g/dL (ref 30.0–36.0)
MCV: 97.9 fL (ref 80.0–100.0)
Monocytes Absolute: 0.4 10*3/uL (ref 0.1–1.0)
Monocytes Relative: 9 %
Neutro Abs: 2.9 10*3/uL (ref 1.7–7.7)
Neutrophils Relative %: 65 %
Platelets: 87 10*3/uL — ABNORMAL LOW (ref 150–400)
RBC: 4.27 MIL/uL (ref 3.87–5.11)
RDW: 15.2 % (ref 11.5–15.5)
WBC: 4.5 10*3/uL (ref 4.0–10.5)
nRBC: 0 % (ref 0.0–0.2)

## 2019-04-15 LAB — I-STAT CHEM 8, ED
BUN: 14 mg/dL (ref 8–23)
Calcium, Ion: 1.25 mmol/L (ref 1.15–1.40)
Chloride: 105 mmol/L (ref 98–111)
Creatinine, Ser: 0.8 mg/dL (ref 0.44–1.00)
Glucose, Bld: 89 mg/dL (ref 70–99)
HCT: 41 % (ref 36.0–46.0)
Hemoglobin: 13.9 g/dL (ref 12.0–15.0)
Potassium: 5.3 mmol/L — ABNORMAL HIGH (ref 3.5–5.1)
Sodium: 139 mmol/L (ref 135–145)
TCO2: 33 mmol/L — ABNORMAL HIGH (ref 22–32)

## 2019-04-15 LAB — TROPONIN I (HIGH SENSITIVITY)
Troponin I (High Sensitivity): 25 ng/L — ABNORMAL HIGH (ref <18)
Troponin I (High Sensitivity): 27 ng/L — ABNORMAL HIGH (ref <18)

## 2019-04-15 LAB — SARS CORONAVIRUS 2 (TAT 6-24 HRS): SARS Coronavirus 2: NEGATIVE

## 2019-04-15 MED ORDER — HEPARIN SODIUM (PORCINE) 5000 UNIT/ML IJ SOLN: 5000.0000 [IU] | Freq: Two times a day (BID) | INTRAMUSCULAR | Status: DC

## 2019-04-15 MED ORDER — ACETAMINOPHEN 500 MG PO TABS
1000.0000 mg | ORAL_TABLET | Freq: Once | ORAL | Status: AC
  Administered 2019-04-15: 1000 mg via ORAL
  Filled 2019-04-15: qty 2

## 2019-04-15 MED ORDER — IOHEXOL 300 MG/ML  SOLN
100.0000 mL | Freq: Once | INTRAMUSCULAR | Status: AC | PRN
  Administered 2019-04-15: 100 mL via INTRAVENOUS

## 2019-04-15 MED ORDER — POTASSIUM CHLORIDE ER 8 MEQ PO TBCR: 8.0000 meq | EXTENDED_RELEASE_TABLET | ORAL | Status: DC

## 2019-04-15 MED ORDER — B COMPLEX VITAMINS PO CAPS: 1.0000 | ORAL_CAPSULE | Freq: Every day | ORAL | Status: DC

## 2019-04-15 MED ORDER — VITAMIN D 25 MCG (1000 UNIT) PO TABS
2000.0000 [IU] | ORAL_TABLET | Freq: Every day | ORAL | Status: DC
  Administered 2019-04-15 – 2019-04-18 (×4): 2000 [IU] via ORAL
  Filled 2019-04-15 (×4): qty 2

## 2019-04-15 MED ORDER — PANTOPRAZOLE SODIUM 40 MG PO TBEC
40.0000 mg | DELAYED_RELEASE_TABLET | Freq: Every day | ORAL | Status: DC
  Administered 2019-04-15 – 2019-04-18 (×4): 40 mg via ORAL
  Filled 2019-04-15 (×4): qty 1

## 2019-04-15 MED ORDER — FUROSEMIDE 20 MG PO TABS: 40.0000 mg | ORAL_TABLET | Freq: Every day | ORAL | Status: DC | PRN

## 2019-04-15 MED ORDER — LIDOCAINE 5 % EX PTCH
1.0000 | MEDICATED_PATCH | CUTANEOUS | Status: DC
  Administered 2019-04-15 – 2019-04-17 (×3): 1 via TRANSDERMAL
  Filled 2019-04-15 (×3): qty 1

## 2019-04-15 MED ORDER — OXYCODONE HCL 5 MG PO TABS: 5.0000 mg | ORAL_TABLET | ORAL | Status: DC | PRN

## 2019-04-15 MED ORDER — IMMUNE ENHANCE PO TABS: 1.0000 | ORAL_TABLET | Freq: Every day | ORAL | Status: DC

## 2019-04-15 MED ORDER — DONEPEZIL HCL 5 MG PO TABS
5.0000 mg | ORAL_TABLET | Freq: Every day | ORAL | Status: DC
  Administered 2019-04-15 – 2019-04-17 (×3): 5 mg via ORAL
  Filled 2019-04-15 (×3): qty 1

## 2019-04-15 MED ORDER — ALPRAZOLAM 0.5 MG PO TABS
0.5000 mg | ORAL_TABLET | Freq: Every day | ORAL | Status: DC | PRN
  Administered 2019-04-15 – 2019-04-17 (×3): 0.5 mg via ORAL
  Filled 2019-04-15 (×3): qty 1

## 2019-04-15 MED ORDER — PROBIOTIC-10 ULTIMATE PO CAPS: ORAL_CAPSULE | Freq: Every day | ORAL | Status: DC

## 2019-04-15 MED ORDER — ACETAMINOPHEN 325 MG PO TABS: 650.0000 mg | ORAL_TABLET | Freq: Four times a day (QID) | ORAL | Status: DC | PRN

## 2019-04-15 MED ORDER — IRBESARTAN 75 MG PO TABS
150.0000 mg | ORAL_TABLET | Freq: Every day | ORAL | Status: DC
  Administered 2019-04-15 – 2019-04-18 (×4): 150 mg via ORAL
  Filled 2019-04-15: qty 2
  Filled 2019-04-15: qty 1
  Filled 2019-04-15 (×2): qty 2

## 2019-04-15 MED ORDER — RISAQUAD PO CAPS
1.0000 | ORAL_CAPSULE | Freq: Every day | ORAL | Status: DC
  Administered 2019-04-15 – 2019-04-18 (×4): 1 via ORAL
  Filled 2019-04-15 (×4): qty 1

## 2019-04-15 MED ORDER — ASCORBIC ACID 500 MG PO TABS
500.0000 mg | ORAL_TABLET | Freq: Every day | ORAL | Status: DC
  Administered 2019-04-15 – 2019-04-18 (×4): 500 mg via ORAL
  Filled 2019-04-15 (×4): qty 1

## 2019-04-15 MED ORDER — ACETAMINOPHEN 650 MG RE SUPP: 650.0000 mg | Freq: Four times a day (QID) | RECTAL | Status: DC | PRN

## 2019-04-15 MED ORDER — SIMVASTATIN 20 MG PO TABS
10.0000 mg | ORAL_TABLET | Freq: Every day | ORAL | Status: DC
  Administered 2019-04-15 – 2019-04-18 (×4): 10 mg via ORAL
  Filled 2019-04-15 (×4): qty 1

## 2019-04-15 MED ORDER — B COMPLEX-C PO TABS
1.0000 | ORAL_TABLET | Freq: Every day | ORAL | Status: DC
  Administered 2019-04-16 – 2019-04-18 (×3): 1 via ORAL
  Filled 2019-04-15 (×4): qty 1

## 2019-04-15 NOTE — ED Notes (Signed)
Back in unit from CT.

## 2019-04-15 NOTE — Progress Notes (Signed)
Orthopedic Tech Progress Note Patient Details:  [name] [birth year] [id number]  Ortho Devices Ortho Device/Splint Location: Level 2 trauma alert       [name] 04/15/2019, 1:26 PM

## 2019-04-15 NOTE — ED Notes (Signed)
Patient taken to CT; accompanied by Nira Conn, RN

## 2019-04-15 NOTE — Progress Notes (Addendum)
NEW ADMISSION NOTE New Admission Note:   Arrival Method: via stretcher from ED Mental Orientation: alert and oriented x4 Telemetry: box 14, CCMD notiifed Assessment: In progress Skin: warm, dry, bruising on left side of the eye; bil. Arms and legs. IV: L arm NSL Pain: 0-10 Tubes: none Safety Measures: Safety Fall Prevention Plan has been given, discussed and signed [facility] [place] Orientation: Patient has been orientated to the room, unit and staff.  Family: at bedside  Orders have been reviewed and implemented. Will continue to monitor the patient. Call light has been placed within reach and bed alarm has been activated.   Orville Govern, RN

## 2019-04-15 NOTE — ED Triage Notes (Signed)
Came in via EMS; reported ground level fall; stated walking to the bathroom and fell, + thinners (eliquis 10 mg) denies LOC. Family concerned for worsening slurred speech and altered mentation

## 2019-04-15 NOTE — ED Provider Notes (Signed)
[facility] EMERGENCY DEPARTMENT Provider Note   CSN: [id number] Arrival date & time: 04/15/19  1300     History Chief Complaint  Patient presents with   Lytle Michaels    Denyce Wilkinson is a 86 y.o. female.  HPI Patient got up to go to the bathroom.  She reports that she either lost her balance or got weak.  She ended up falling.  She is not sure how or why she fell.  He did strike her head but reports she did not get knocked out.  She reports she does have some headache and pain on the side of her face where she hit her head.  She also has a lot of pain in her back.  It seems to be close to the right shoulder blade.  She also has pain in the right knee.  She reports she did not get up and walk independently after the fall.  She had to have her son help her and she was brought by EMS.  Does take Eliquis.  Patient lives with her son.  At baseline she ambulates with a walker independently.  She has grab bars in her bathroom.       No past medical history on file.  There are no problems to display for this patient.      OB History   No obstetric history on file.     No family history on file.  Social History   Tobacco Use   Smoking status: Not on file  Substance Use Topics   Alcohol use: Not on file   Drug use: Not on file    Home Medications Prior to Admission medications   Medication Sig Start Date End Date Taking? Authorizing Provider  ALPRAZolam Duanne Moron) 0.5 MG tablet Take 0.5 mg by mouth daily as needed for anxiety or sleep. 04/13/19  Yes [provider]  Ascorbic Acid (VITAMIN C) 1000 MG tablet Take 500 mg by mouth daily.    Yes [provider]  b complex vitamins capsule Take 1 capsule by mouth daily.   Yes [provider]  Cholecalciferol (VITAMIN D) 50 MCG (2000 UT) tablet Take 2,000 Units by mouth daily.   Yes [provider]  donepezil (ARICEPT) 5 MG tablet Take 5 mg by mouth at bedtime. 03/15/19  Yes  [provider]  ELIQUIS 5 MG TABS tablet Take 5 mg by mouth 2 (two) times daily. 03/21/19  Yes [provider]  furosemide (LASIX) 40 MG tablet Take 40 mg by mouth daily as needed for fluid. 10/22/18  Yes [provider]  Nutritional Supplements (IMMUNE ENHANCE) TABS Take 1 tablet by mouth daily.   Yes [provider]  olmesartan (BENICAR) 20 MG tablet Take 20 mg by mouth daily. 04/07/19  Yes [provider]  omeprazole (PRILOSEC) 20 MG capsule Take 20 mg by mouth daily. 04/09/19  Yes [provider]  potassium chloride (KLOR-CON) 8 MEQ tablet Take 8 mEq by mouth See admin instructions. As needed if taking furosemide 02/26/19  Yes [provider]  Probiotic Product (PROBIOTIC-10 ULTIMATE PO) Take 1 capsule by mouth daily.   Yes [provider]  simvastatin (ZOCOR) 10 MG tablet Take 10 mg by mouth daily. 03/07/19  Yes [provider]    Allergies    Patient has no allergy information on record.  Review of Systems   Review of Systems 10 Systems reviewed and are negative for acute change except as noted in the  HPI.  Physical Exam Updated Vital Signs BP (!) 182/71    Pulse 83    Temp 98 F (36.7 C) (Oral)    Resp (!) 21    Ht 5\' 7"  (1.702 m)    Wt 63.5 kg    SpO2 95%    BMI 21.93 kg/m   Physical Exam Constitutional:      Comments: Patient is alert.  She arrives in cervical collar.  No respiratory distress.  HENT:     Head:     Comments: Patient has hematoma to the left temple.  Superficial abrasion.  No active bleeding.  Hematoma over the left zygoma as well.  There is a tender spot on the posterior parietal scalp but no laceration or hematoma.    Nose: Nose normal.     Mouth/Throat:     Mouth: Mucous membranes are moist.     Pharynx: Oropharynx is clear.  Eyes:     Extraocular Movements: Extraocular movements intact.     Conjunctiva/sclera: Conjunctivae normal.     Pupils: Pupils are equal, round, and reactive  to light.     Comments: Extraocular motions are intact.  Notably when the patient is rolled or reposition she develops a pronounced lateral nystagmus.  This then abates when she has been stationary for a while.  Neck:     Comments: Patient arrives in cervical collar.  She does endorse discomfort in the lower cervical spine. Cardiovascular:     Comments: Irregularly irregular.  Pulmonary:     Comments: No respiratory distress.  Lungs are grossly clear.  Patient has significant tenderness to palpation on the posterior right scapular and thoracic area.  No crepitus of the chest wall. Abdominal:     General: There is no distension.     Palpations: Abdomen is soft.     Tenderness: There is no abdominal tenderness. There is no guarding.  Musculoskeletal:     Comments: Bilateral upper extremities range of motion intact.  Patient does have pain in the posterior thorax with right for extremity range of motion.  Patient has extensive ecchymoses and bruising diffusely around the right knee.  She is able to slightly bend the knee.  I can flex and extend at both hips without locking or severe pain.  Some flexion of the hips does exacerbate lower back pain.  Patient endorses pain to palpation over the LS spine and sacrum.  Skin:    General: Skin is warm and dry.  Neurological:     Comments: Patient is alert and oriented.  She is giving history.  She follows commands.  She can perform grip strength bilateral upper extremities.  She can assist somewhat in flexion extension of the lower extremities in supine position.  Patient does develop very pronounced nystagmus when she is rolled.  She denies feeling symptomatic with it.  Psychiatric:        Mood and Affect: Mood normal.     ED Results / Procedures / Treatments   Labs (all labs ordered are listed, but only abnormal results are displayed) Labs Reviewed  COMPREHENSIVE METABOLIC PANEL - Abnormal; Notable for the following components:      Result Value    Total Protein 5.4 (*)    Total Bilirubin 1.3 (*)    GFR calc non Af Amer 52 (*)    All other components within normal limits  CBC WITH DIFFERENTIAL/PLATELET - Abnormal; Notable for the following components:   Platelets 87 (*)    All other components within  normal limits  URINALYSIS, ROUTINE W REFLEX MICROSCOPIC - Abnormal; Notable for the following components:   APPearance HAZY (*)    pH 9.0 (*)    Hgb urine dipstick SMALL (*)    Nitrite POSITIVE (*)    Leukocytes,Ua TRACE (*)    Bacteria, UA FEW (*)    All other components within normal limits  I-STAT CHEM 8, ED - Abnormal; Notable for the following components:   Potassium 5.3 (*)    TCO2 33 (*)    All other components within normal limits  TROPONIN I (HIGH SENSITIVITY) - Abnormal; Notable for the following components:   Troponin I (High Sensitivity) 25 (*)    All other components within normal limits  TROPONIN I (HIGH SENSITIVITY)    EKG None  Radiology DG Knee 2 Views Right  Result Date: 04/15/2019 CLINICAL DATA:  Trauma secondary to a fall. EXAM: RIGHT KNEE - 1-2 VIEW COMPARISON:  None. FINDINGS: There is no fracture or dislocation or joint effusion. 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 Chondrocalcinosis. IMPRESSION: No acute abnormality. Arthritic changes as described. Electronically Signed   By: Lorriane Shire M.D.   On: 04/15/2019 13:23   CT Head Wo Contrast  Result Date: 04/15/2019 CLINICAL DATA:  Posttraumatic headache and neck pain after trauma. EXAM: CT HEAD WITHOUT CONTRAST CT CERVICAL SPINE WITHOUT CONTRAST TECHNIQUE: Multidetector CT imaging of the head and cervical spine was performed following the standard protocol without intravenous contrast. Multiplanar CT image reconstructions of the cervical spine were also generated. COMPARISON:  February 11, 2018. November 02, 2018. FINDINGS: CT HEAD FINDINGS Brain: Mild diffuse cortical atrophy is noted. Mild chronic ischemic white matter  disease is noted. No mass effect or midline shift is noted. Ventricular size is within normal limits. There is no evidence of mass lesion, hemorrhage or acute infarction. Vascular: No hyperdense vessel or unexpected calcification. Skull: Normal. Negative for fracture or focal lesion. Sinuses/Orbits: No acute finding. Other: Mild right posterior parietal scalp hematoma is noted. CT CERVICAL SPINE FINDINGS Alignment: Mild grade 1 retrolisthesis of C5-6 is noted secondary to degenerative disc disease at this level. Skull base and vertebrae: No acute fracture. No primary bone lesion or focal pathologic process. Soft tissues and spinal canal: No prevertebral fluid or swelling. No visible canal hematoma. Disc levels: Severe degenerative disc disease is noted at C5-6 with anterior posterior osteophyte formation. Upper chest: Negative. Other: None. IMPRESSION: 1. Mild right posterior parietal scalp hematoma. Mild diffuse cortical atrophy. Mild chronic ischemic white matter disease. No acute intracranial abnormality seen. 2. Multilevel degenerative disc disease. No acute abnormality seen in the cervical spine. Electronically Signed   By: Marijo Conception M.D.   On: 04/15/2019 14:28   CT Chest W Contrast  Result Date: 04/15/2019 CLINICAL DATA:  Abdominal trauma. Ground level fall on blood thinners. EXAM: CT CHEST, ABDOMEN, AND PELVIS WITH CONTRAST TECHNIQUE: Multidetector CT imaging of the chest, abdomen and pelvis was performed following the standard protocol during bolus administration of intravenous contrast. CONTRAST:  19mL OMNIPAQUE IOHEXOL 300 MG/ML  SOLN COMPARISON:  None available FINDINGS: CT CHEST FINDINGS Cardiovascular: No significant vascular findings. Normal heart size. No pericardial effusion. Heart is enlarged. Mediastinum/Nodes: No axillary or supraclavicular adenopathy. No mediastinal or hilar adenopathy esophagus normal. Trachea normal Lungs/Pleura: No pneumothorax. Small bilateral pleural effusions  greater on the RIGHT. Airways normal. Musculoskeletal: No rib fracture. No scapular fracture CT ABDOMEN AND PELVIS FINDINGS Hepatobiliary: Benign cyst in the RIGHT hepatic lobe. Low-density lesion LEFT hepatic lobe  is is favored benign cysts all the not fully characterize. No hepatic laceration. Pancreas: Pancreas is normal. No ductal dilatation. No pancreatic inflammation. Spleen: Heterogeneous perfusion of the spleen. No splenic laceration. Adrenals/urinary tract: Adrenal glands normal. No evidence of renal cortical injury. Partially exophytic round lesion extending from the RIGHT kidney is high-density on noncontrast and postcontrast imaging most consistent with benign hemorrhagic cyst. Simple fluid attenuation cyst extending from the lower pole of the RIGHT kidney. Bladder is intact Stomach/Bowel: Stomach, small bowel, appendix, and cecum are normal. The colon and rectosigmoid colon are normal. The descending colon does enter a LEFT inguinal hernia sac. The bowel is nonobstructed. The sac is relatively large measuring 7.8 x 2.0 cm (image 98/8). Distal rectosigmoid colon normal. There are several diverticula sigmoid colon. Vascular/Lymphatic: Abdominal aorta is normal caliber. There is no retroperitoneal or periportal lymphadenopathy. No pelvic lymphadenopathy. Reproductive: Post hysterectomy Other: No free fluid. Musculoskeletal: No pelvic fracture spine fracture IMPRESSION: Chest. 1. No evidence of thoracic trauma. 2. Small bilateral pleural effusions. 3. Cardiomegaly. Abdomen / Pelvis. 1. No evidence of solid organ injury in the abdomen pelvis. 2. Large LEFT inguinal hernia sac contains nonobstructed descending colon. 3. Mixed density renal cysts are favored benign hemorrhagic and nonhemorrhagic cysts. 4. Heterogeneous perfusion of the spleen is favored benign. No evidence of splenic trauma. Electronically Signed   By: Suzy Bouchard M.D.   On: 04/15/2019 14:38   CT Cervical Spine Wo Contrast  Result  Date: 04/15/2019 CLINICAL DATA:  Posttraumatic headache and neck pain after trauma. EXAM: CT HEAD WITHOUT CONTRAST CT CERVICAL SPINE WITHOUT CONTRAST TECHNIQUE: Multidetector CT imaging of the head and cervical spine was performed following the standard protocol without intravenous contrast. Multiplanar CT image reconstructions of the cervical spine were also generated. COMPARISON:  February 11, 2018. November 02, 2018. FINDINGS: CT HEAD FINDINGS Brain: Mild diffuse cortical atrophy is noted. Mild chronic ischemic white matter disease is noted. No mass effect or midline shift is noted. Ventricular size is within normal limits. There is no evidence of mass lesion, hemorrhage or acute infarction. Vascular: No hyperdense vessel or unexpected calcification. Skull: Normal. Negative for fracture or focal lesion. Sinuses/Orbits: No acute finding. Other: Mild right posterior parietal scalp hematoma is noted. CT CERVICAL SPINE FINDINGS Alignment: Mild grade 1 retrolisthesis of C5-6 is noted secondary to degenerative disc disease at this level. Skull base and vertebrae: No acute fracture. No primary bone lesion or focal pathologic process. Soft tissues and spinal canal: No prevertebral fluid or swelling. No visible canal hematoma. Disc levels: Severe degenerative disc disease is noted at C5-6 with anterior posterior osteophyte formation. Upper chest: Negative. Other: None. IMPRESSION: 1. Mild right posterior parietal scalp hematoma. Mild diffuse cortical atrophy. Mild chronic ischemic white matter disease. No acute intracranial abnormality seen. 2. Multilevel degenerative disc disease. No acute abnormality seen in the cervical spine. Electronically Signed   By: Marijo Conception M.D.   On: 04/15/2019 14:28   CT Abdomen Pelvis W Contrast  Result Date: 04/15/2019 CLINICAL DATA:  Abdominal trauma. Ground level fall on blood thinners. EXAM: CT CHEST, ABDOMEN, AND PELVIS WITH CONTRAST TECHNIQUE: Multidetector CT imaging of the  chest, abdomen and pelvis was performed following the standard protocol during bolus administration of intravenous contrast. CONTRAST:  176mL OMNIPAQUE IOHEXOL 300 MG/ML  SOLN COMPARISON:  None available FINDINGS: CT CHEST FINDINGS Cardiovascular: No significant vascular findings. Normal heart size. No pericardial effusion. Heart is enlarged. Mediastinum/Nodes: No axillary or supraclavicular adenopathy. No mediastinal or hilar adenopathy esophagus  normal. Trachea normal Lungs/Pleura: No pneumothorax. Small bilateral pleural effusions greater on the RIGHT. Airways normal. Musculoskeletal: No rib fracture. No scapular fracture CT ABDOMEN AND PELVIS FINDINGS Hepatobiliary: Benign cyst in the RIGHT hepatic lobe. Low-density lesion LEFT hepatic lobe is is favored benign cysts all the not fully characterize. No hepatic laceration. Pancreas: Pancreas is normal. No ductal dilatation. No pancreatic inflammation. Spleen: Heterogeneous perfusion of the spleen. No splenic laceration. Adrenals/urinary tract: Adrenal glands normal. No evidence of renal cortical injury. Partially exophytic round lesion extending from the RIGHT kidney is high-density on noncontrast and postcontrast imaging most consistent with benign hemorrhagic cyst. Simple fluid attenuation cyst extending from the lower pole of the RIGHT kidney. Bladder is intact Stomach/Bowel: Stomach, small bowel, appendix, and cecum are normal. The colon and rectosigmoid colon are normal. The descending colon does enter a LEFT inguinal hernia sac. The bowel is nonobstructed. The sac is relatively large measuring 7.8 x 2.0 cm (image 98/8). Distal rectosigmoid colon normal. There are several diverticula sigmoid colon. Vascular/Lymphatic: Abdominal aorta is normal caliber. There is no retroperitoneal or periportal lymphadenopathy. No pelvic lymphadenopathy. Reproductive: Post hysterectomy Other: No free fluid. Musculoskeletal: No pelvic fracture spine fracture IMPRESSION: Chest.  1. No evidence of thoracic trauma. 2. Small bilateral pleural effusions. 3. Cardiomegaly. Abdomen / Pelvis. 1. No evidence of solid organ injury in the abdomen pelvis. 2. Large LEFT inguinal hernia sac contains nonobstructed descending colon. 3. Mixed density renal cysts are favored benign hemorrhagic and nonhemorrhagic cysts. 4. Heterogeneous perfusion of the spleen is favored benign. No evidence of splenic trauma. Electronically Signed   By: Suzy Bouchard M.D.   On: 04/15/2019 14:38    Procedures Procedures (including critical care time)  Medications Ordered in ED Medications  iohexol (OMNIPAQUE) 300 MG/ML solution 100 mL (100 mLs Intravenous Contrast Given 04/15/19 1418)  acetaminophen (TYLENOL) tablet 1,000 mg (1,000 mg Oral Given 04/15/19 1532)    ED Course  I have reviewed the triage vital signs and the nursing notes.  Pertinent labs & imaging results that were available during my care of the patient were reviewed by me and considered in my medical decision making (see chart for details).    MDM Rules/Calculators/A&P                      Consult: Dr. Roosevelt Locks for admission.   Patient had a fall at home.  Unclear if this is strictly a mechanical fall.  She was trying to go to the bathroom independently.  At this time patient does not appear to have focal neurologic deficit to suggest acute stroke.  CT head does not show any traumatic injury or intracranial bleeding.  Patient does have significant posterior thoracic and lower back pain.  CT scans did not identify acute fracture.  Patient is in coagulated is got extensive bruising particularly around the right knee.  At this time with fall of unclear etiology and significant mobility limitation secondary to pain will plan for admission for pain control and PT evaluation for safety for return home. Final Clinical Impression(s) / ED Diagnoses Final diagnoses:  Fall, initial encounter  Injury of head, initial encounter  Hx of long term use of  blood thinners  Hematoma of right knee region  Ambulatory dysfunction    Rx / DC Orders ED Discharge Orders    None       Charlesetta Shanks, MD 04/15/19 1609

## 2019-04-15 NOTE — H&P (Addendum)
History and Physical    [name] [id number] DOB: Oct 17, 1929 DOA: 04/15/2019  PCP: Lawerance Cruel, MD   Patient coming from: Home  I have personally briefly reviewed patient's old medical records in [place]  Chief Complaint: I fell  HPI: [name] is a 86 y.o. female with medical history significant of recent embolic stroke in July [id number], A. fib, with left atrium thrombosis, chronic ambulatory dysfunction, baseline using walker to ambulate, presented with episode of fall.  Patient and her son both reported that patient forgot to use her walker this morning, and fell down in the bathroom.  Denied any lightheadedness, no blurred vision no chest pain, no short of breath, denied any weakness or numbness of any of the limbs.  Son was not able to pull her up and then called 911.   She remembered properly hit her head against the wall, then developed headache and pain on the side of her face, also complaining about right shoulder and right knee pain.    She denied any loss of consciousness.  The son reported that the patient was hospitalized July 2020 for episode of new onset A. fib, and and later pacemaker was placed.  But then patient developed urosepsis and then bacteremia.  Then also had a TEE showing 2 small clots in her heart, and eventually patient was started on Eliquis for that. ED Course: Pan scan showed scalp hematoma no fracture or dislocation.  Review of Systems: As per HPI otherwise 10 point review of systems negative.    PMH, as mentioned in HPI  Past surgical history, PPM   has no history on file for tobacco, alcohol, and drug.  No Known Allergies  Family history, negative for hypertension or diabetes.   Prior to Admission medications   Medication Sig Start Date End Date Taking? Authorizing Provider  ALPRAZolam Duanne Moron) 0.5 MG tablet Take 0.5 mg by mouth daily as needed for anxiety or sleep. 04/13/19  Yes [provider]  Ascorbic Acid  (VITAMIN C) 1000 MG tablet Take 500 mg by mouth daily.    Yes [provider]  b complex vitamins capsule Take 1 capsule by mouth daily.   Yes [provider]  Cholecalciferol (VITAMIN D) 50 MCG (2000 UT) tablet Take 2,000 Units by mouth daily.   Yes [provider]  donepezil (ARICEPT) 5 MG tablet Take 5 mg by mouth at bedtime. 03/15/19  Yes [provider]  ELIQUIS 5 MG TABS tablet Take 5 mg by mouth 2 (two) times daily. 03/21/19  Yes [provider]  furosemide (LASIX) 40 MG tablet Take 40 mg by mouth daily as needed for fluid. 10/22/18  Yes [provider]  Nutritional Supplements (IMMUNE ENHANCE) TABS Take 1 tablet by mouth daily.   Yes [provider]  olmesartan (BENICAR) 20 MG tablet Take 20 mg by mouth daily. 04/07/19  Yes [provider]  omeprazole (PRILOSEC) 20 MG capsule Take 20 mg by mouth daily. 04/09/19  Yes [provider]  potassium chloride (KLOR-CON) 8 MEQ tablet Take 8 mEq by mouth See admin instructions. As needed if taking furosemide 02/26/19  Yes [provider]  Probiotic Product (PROBIOTIC-10 ULTIMATE PO) Take 1 capsule by mouth daily.   Yes [provider]  simvastatin (ZOCOR) 10 MG tablet Take 10 mg by mouth daily. 03/07/19  Yes [provider]    Physical Exam: Vitals:   04/15/19 1630 04/15/19 1700 04/15/19 1730 04/15/19 1750  BP: Marland Kitchen)  159/60 (!) 164/85 (!) 181/77 (!) 171/80  Pulse: 80 71 80 78  Resp: 17 16 15 18   Temp:      TempSrc:      SpO2: 95% 96% 95% 96%  Weight:      Height:        Constitutional: NAD, calm, comfortable Vitals:   04/15/19 1630 04/15/19 1700 04/15/19 1730 04/15/19 1750  BP: (!) 159/60 (!) 164/85 (!) 181/77 (!) 171/80  Pulse: 80 71 80 78  Resp: 17 16 15 18   Temp:      TempSrc:      SpO2: 95% 96% 95% 96%  Weight:      Height:       Eyes: PERRL, lids and conjunctivae normal ENMT: Mucous membranes are moist. Posterior pharynx clear  of any exudate or lesions.Normal dentition.  Neck: normal, supple, no masses, no thyromegaly Respiratory: clear to auscultation bilaterally, no wheezing, no crackles. Normal respiratory effort. No accessory muscle use.  Cardiovascular: Regular rate and rhythm, soft murmur on heart base. No extremity edema. 2+ pedal pulses. No carotid bruits.  Abdomen: no tenderness, no masses palpated. No hepatosplenomegaly. Bowel sounds positive.  Musculoskeletal: no clubbing / cyanosis. No joint deformity upper and lower extremities. Good ROM, no contractures. Normal muscle tone.  Skin: no rashes, lesions, ulcers. No induration Neurologic: CN 2-12 grossly intact. Sensation intact, DTR normal. Strength 5/5 in all 4.  Psychiatric: Normal judgment and insight. Alert and oriented x 3. Normal mood.     Labs on Admission: I have personally reviewed following labs and imaging studies  CBC: Recent Labs  Lab 04/15/19 1310 04/15/19 1316  WBC 4.5  --   NEUTROABS 2.9  --   HGB 13.0 13.9  HCT 41.8 41.0  MCV 97.9  --   PLT 87*  --    Basic Metabolic Panel: Recent Labs  Lab 04/15/19 1310 04/15/19 1316  NA 142 139  K 3.8 5.3*  CL 105 105  CO2 28  --   GLUCOSE 96 89  BUN 10 14  CREATININE 0.98 0.80  CALCIUM 10.3  --    GFR: Estimated Creatinine Clearance: 48.2 mL/min (by C-G formula based on SCr of 0.8 mg/dL). Liver Function Tests: Recent Labs  Lab 04/15/19 1310  AST 28  ALT 16  ALKPHOS 49  BILITOT 1.3*  PROT 5.4*  ALBUMIN 3.5   No results for input(s): LIPASE, AMYLASE in the last 168 hours. No results for input(s): AMMONIA in the last 168 hours. Coagulation Profile: No results for input(s): INR, PROTIME in the last 168 hours. Cardiac Enzymes: No results for input(s): CKTOTAL, CKMB, CKMBINDEX, TROPONINI in the last 168 hours. BNP (last 3 results) No results for input(s): PROBNP in the last 8760 hours. HbA1C: No results for input(s): HGBA1C in the last 72 hours. CBG: No results for  input(s): GLUCAP in the last 168 hours. Lipid Profile: No results for input(s): CHOL, HDL, LDLCALC, TRIG, CHOLHDL, LDLDIRECT in the last 72 hours. Thyroid Function Tests: No results for input(s): TSH, T4TOTAL, FREET4, T3FREE, THYROIDAB in the last 72 hours. Anemia Panel: No results for input(s): VITAMINB12, FOLATE, FERRITIN, TIBC, IRON, RETICCTPCT in the last 72 hours. Urine analysis:    Component Value Date/Time   COLORURINE YELLOW 04/15/2019 1446   APPEARANCEUR HAZY (A) 04/15/2019 1446   LABSPEC 1.015 04/15/2019 1446   PHURINE 9.0 (H) 04/15/2019 1446   GLUCOSEU NEGATIVE 04/15/2019 1446   HGBUR SMALL (A) 04/15/2019 1446   BILIRUBINUR NEGATIVE 04/15/2019 1446   KETONESUR NEGATIVE  04/15/2019 1446   PROTEINUR NEGATIVE 04/15/2019 1446   NITRITE POSITIVE (A) 04/15/2019 1446   LEUKOCYTESUR TRACE (A) 04/15/2019 1446    Radiological Exams on Admission: DG Knee 2 Views Right  Result Date: 04/15/2019 CLINICAL DATA:  Trauma secondary to a fall. EXAM: RIGHT KNEE - 1-2 VIEW COMPARISON:  None. FINDINGS: There is no fracture or dislocation or joint effusion. 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 Chondrocalcinosis. IMPRESSION: No acute abnormality. Arthritic changes as described. Electronically Signed   By: Lorriane Shire M.D.   On: 04/15/2019 13:23   CT Head Wo Contrast  Result Date: 04/15/2019 CLINICAL DATA:  Posttraumatic headache and neck pain after trauma. EXAM: CT HEAD WITHOUT CONTRAST CT CERVICAL SPINE WITHOUT CONTRAST TECHNIQUE: Multidetector CT imaging of the head and cervical spine was performed following the standard protocol without intravenous contrast. Multiplanar CT image reconstructions of the cervical spine were also generated. COMPARISON:  February 11, 2018. November 02, 2018. FINDINGS: CT HEAD FINDINGS Brain: Mild diffuse cortical atrophy is noted. Mild chronic ischemic white matter disease is noted. No mass effect or midline shift is noted.  Ventricular size is within normal limits. There is no evidence of mass lesion, hemorrhage or acute infarction. Vascular: No hyperdense vessel or unexpected calcification. Skull: Normal. Negative for fracture or focal lesion. Sinuses/Orbits: No acute finding. Other: Mild right posterior parietal scalp hematoma is noted. CT CERVICAL SPINE FINDINGS Alignment: Mild grade 1 retrolisthesis of C5-6 is noted secondary to degenerative disc disease at this level. Skull base and vertebrae: No acute fracture. No primary bone lesion or focal pathologic process. Soft tissues and spinal canal: No prevertebral fluid or swelling. No visible canal hematoma. Disc levels: Severe degenerative disc disease is noted at C5-6 with anterior posterior osteophyte formation. Upper chest: Negative. Other: None. IMPRESSION: 1. Mild right posterior parietal scalp hematoma. Mild diffuse cortical atrophy. Mild chronic ischemic white matter disease. No acute intracranial abnormality seen. 2. Multilevel degenerative disc disease. No acute abnormality seen in the cervical spine. Electronically Signed   By: Marijo Conception M.D.   On: 04/15/2019 14:28   CT Chest W Contrast  Result Date: 04/15/2019 CLINICAL DATA:  Abdominal trauma. Ground level fall on blood thinners. EXAM: CT CHEST, ABDOMEN, AND PELVIS WITH CONTRAST TECHNIQUE: Multidetector CT imaging of the chest, abdomen and pelvis was performed following the standard protocol during bolus administration of intravenous contrast. CONTRAST:  18mL OMNIPAQUE IOHEXOL 300 MG/ML  SOLN COMPARISON:  None available FINDINGS: CT CHEST FINDINGS Cardiovascular: No significant vascular findings. Normal heart size. No pericardial effusion. Heart is enlarged. Mediastinum/Nodes: No axillary or supraclavicular adenopathy. No mediastinal or hilar adenopathy esophagus normal. Trachea normal Lungs/Pleura: No pneumothorax. Small bilateral pleural effusions greater on the RIGHT. Airways normal. Musculoskeletal: No rib  fracture. No scapular fracture CT ABDOMEN AND PELVIS FINDINGS Hepatobiliary: Benign cyst in the RIGHT hepatic lobe. Low-density lesion LEFT hepatic lobe is is favored benign cysts all the not fully characterize. No hepatic laceration. Pancreas: Pancreas is normal. No ductal dilatation. No pancreatic inflammation. Spleen: Heterogeneous perfusion of the spleen. No splenic laceration. Adrenals/urinary tract: Adrenal glands normal. No evidence of renal cortical injury. Partially exophytic round lesion extending from the RIGHT kidney is high-density on noncontrast and postcontrast imaging most consistent with benign hemorrhagic cyst. Simple fluid attenuation cyst extending from the lower pole of the RIGHT kidney. Bladder is intact Stomach/Bowel: Stomach, small bowel, appendix, and cecum are normal. The colon and rectosigmoid colon are normal. The descending colon does enter a LEFT inguinal hernia  sac. The bowel is nonobstructed. The sac is relatively large measuring 7.8 x 2.0 cm (image 98/8). Distal rectosigmoid colon normal. There are several diverticula sigmoid colon. Vascular/Lymphatic: Abdominal aorta is normal caliber. There is no retroperitoneal or periportal lymphadenopathy. No pelvic lymphadenopathy. Reproductive: Post hysterectomy Other: No free fluid. Musculoskeletal: No pelvic fracture spine fracture IMPRESSION: Chest. 1. No evidence of thoracic trauma. 2. Small bilateral pleural effusions. 3. Cardiomegaly. Abdomen / Pelvis. 1. No evidence of solid organ injury in the abdomen pelvis. 2. Large LEFT inguinal hernia sac contains nonobstructed descending colon. 3. Mixed density renal cysts are favored benign hemorrhagic and nonhemorrhagic cysts. 4. Heterogeneous perfusion of the spleen is favored benign. No evidence of splenic trauma. Electronically Signed   By: Suzy Bouchard M.D.   On: 04/15/2019 14:38   CT Cervical Spine Wo Contrast  Result Date: 04/15/2019 CLINICAL DATA:  Posttraumatic headache and neck  pain after trauma. EXAM: CT HEAD WITHOUT CONTRAST CT CERVICAL SPINE WITHOUT CONTRAST TECHNIQUE: Multidetector CT imaging of the head and cervical spine was performed following the standard protocol without intravenous contrast. Multiplanar CT image reconstructions of the cervical spine were also generated. COMPARISON:  February 11, 2018. November 02, 2018. FINDINGS: CT HEAD FINDINGS Brain: Mild diffuse cortical atrophy is noted. Mild chronic ischemic white matter disease is noted. No mass effect or midline shift is noted. Ventricular size is within normal limits. There is no evidence of mass lesion, hemorrhage or acute infarction. Vascular: No hyperdense vessel or unexpected calcification. Skull: Normal. Negative for fracture or focal lesion. Sinuses/Orbits: No acute finding. Other: Mild right posterior parietal scalp hematoma is noted. CT CERVICAL SPINE FINDINGS Alignment: Mild grade 1 retrolisthesis of C5-6 is noted secondary to degenerative disc disease at this level. Skull base and vertebrae: No acute fracture. No primary bone lesion or focal pathologic process. Soft tissues and spinal canal: No prevertebral fluid or swelling. No visible canal hematoma. Disc levels: Severe degenerative disc disease is noted at C5-6 with anterior posterior osteophyte formation. Upper chest: Negative. Other: None. IMPRESSION: 1. Mild right posterior parietal scalp hematoma. Mild diffuse cortical atrophy. Mild chronic ischemic white matter disease. No acute intracranial abnormality seen. 2. Multilevel degenerative disc disease. No acute abnormality seen in the cervical spine. Electronically Signed   By: Marijo Conception M.D.   On: 04/15/2019 14:28   CT Abdomen Pelvis W Contrast  Result Date: 04/15/2019 CLINICAL DATA:  Abdominal trauma. Ground level fall on blood thinners. EXAM: CT CHEST, ABDOMEN, AND PELVIS WITH CONTRAST TECHNIQUE: Multidetector CT imaging of the chest, abdomen and pelvis was performed following the standard  protocol during bolus administration of intravenous contrast. CONTRAST:  159mL OMNIPAQUE IOHEXOL 300 MG/ML  SOLN COMPARISON:  None available FINDINGS: CT CHEST FINDINGS Cardiovascular: No significant vascular findings. Normal heart size. No pericardial effusion. Heart is enlarged. Mediastinum/Nodes: No axillary or supraclavicular adenopathy. No mediastinal or hilar adenopathy esophagus normal. Trachea normal Lungs/Pleura: No pneumothorax. Small bilateral pleural effusions greater on the RIGHT. Airways normal. Musculoskeletal: No rib fracture. No scapular fracture CT ABDOMEN AND PELVIS FINDINGS Hepatobiliary: Benign cyst in the RIGHT hepatic lobe. Low-density lesion LEFT hepatic lobe is is favored benign cysts all the not fully characterize. No hepatic laceration. Pancreas: Pancreas is normal. No ductal dilatation. No pancreatic inflammation. Spleen: Heterogeneous perfusion of the spleen. No splenic laceration. Adrenals/urinary tract: Adrenal glands normal. No evidence of renal cortical injury. Partially exophytic round lesion extending from the RIGHT kidney is high-density on noncontrast and postcontrast imaging most consistent with benign hemorrhagic cyst.  Simple fluid attenuation cyst extending from the lower pole of the RIGHT kidney. Bladder is intact Stomach/Bowel: Stomach, small bowel, appendix, and cecum are normal. The colon and rectosigmoid colon are normal. The descending colon does enter a LEFT inguinal hernia sac. The bowel is nonobstructed. The sac is relatively large measuring 7.8 x 2.0 cm (image 98/8). Distal rectosigmoid colon normal. There are several diverticula sigmoid colon. Vascular/Lymphatic: Abdominal aorta is normal caliber. There is no retroperitoneal or periportal lymphadenopathy. No pelvic lymphadenopathy. Reproductive: Post hysterectomy Other: No free fluid. Musculoskeletal: No pelvic fracture spine fracture IMPRESSION: Chest. 1. No evidence of thoracic trauma. 2. Small bilateral pleural  effusions. 3. Cardiomegaly. Abdomen / Pelvis. 1. No evidence of solid organ injury in the abdomen pelvis. 2. Large LEFT inguinal hernia sac contains nonobstructed descending colon. 3. Mixed density renal cysts are favored benign hemorrhagic and nonhemorrhagic cysts. 4. Heterogeneous perfusion of the spleen is favored benign. No evidence of splenic trauma. Electronically Signed   By: Suzy Bouchard M.D.   On: 04/15/2019 14:38    EKG: Ordered  Assessment/Plan Active Problems:   Fall at home, initial encounter   Fall  Mechanical fall with chronic ambulation dysfunction Patient understand denied patient had any prodrome of syncope Given the extensive history of atrium thrombosis and stroke, will order a TTE Pain control PT OT evaluation, likely can be discharged back home in next 24 hours  Scalp hematoma secondary to fall Hold Eliquis for today, ice pack  History of stroke underlying A. fib and atrium thrombosis Hold systemic anticoagulation for today, repeat CBC in the morning. Resume Eliquis upon discharge  History of left atrium thrombosis Check TTE  Chronic A. fib with sick sinus Rate controlled, on pacemaker Hold Eliquis for today  HTN Check orthostatic vital signs, continue home meds  UTI versus colonization No symptoms, will hold off ABX.  Thrombocytopenia Chronity unknown, recheck CBC in the morning.   DVT prophylaxis: Heparin subcu Code Status: DNR Family Communication: Son Jeneen Rinks Disposition Plan: Likely can be discharged home tomorrow after PT evaluation Consults called: None Admission status: Telemetry observation   Lequita Halt MD [facility] Pager [phone]    04/15/2019, 6:12 PM

## 2019-04-16 ENCOUNTER — Observation Stay (HOSPITAL_BASED_OUTPATIENT_CLINIC_OR_DEPARTMENT_OTHER): Payer: Medicare PPO

## 2019-04-16 ENCOUNTER — Other Ambulatory Visit: Payer: Self-pay | Admitting: Cardiovascular Disease

## 2019-04-16 DIAGNOSIS — I371 Nonrheumatic pulmonary valve insufficiency: Secondary | ICD-10-CM

## 2019-04-16 DIAGNOSIS — W19XXXA Unspecified fall, initial encounter: Secondary | ICD-10-CM

## 2019-04-16 DIAGNOSIS — Z9229 Personal history of other drug therapy: Secondary | ICD-10-CM

## 2019-04-16 DIAGNOSIS — S8001XA Contusion of right knee, initial encounter: Secondary | ICD-10-CM | POA: Diagnosis not present

## 2019-04-16 DIAGNOSIS — I351 Nonrheumatic aortic (valve) insufficiency: Secondary | ICD-10-CM

## 2019-04-16 DIAGNOSIS — I361 Nonrheumatic tricuspid (valve) insufficiency: Secondary | ICD-10-CM

## 2019-04-16 DIAGNOSIS — S0990XA Unspecified injury of head, initial encounter: Secondary | ICD-10-CM | POA: Diagnosis not present

## 2019-04-16 LAB — BASIC METABOLIC PANEL
Anion gap: 4 — ABNORMAL LOW (ref 5–15)
BUN: 13 mg/dL (ref 8–23)
CO2: 30 mmol/L (ref 22–32)
Calcium: 10 mg/dL (ref 8.9–10.3)
Chloride: 108 mmol/L (ref 98–111)
Creatinine, Ser: 0.91 mg/dL (ref 0.44–1.00)
GFR calc Af Amer: >60 mL/min (ref >60)
GFR calc non Af Amer: 57 mL/min — ABNORMAL LOW (ref >60)
Glucose, Bld: 108 mg/dL — ABNORMAL HIGH (ref 70–99)
Potassium: 4.2 mmol/L (ref 3.5–5.1)
Sodium: 142 mmol/L (ref 135–145)

## 2019-04-16 LAB — CBC
HCT: 36.8 % (ref 36.0–46.0)
Hemoglobin: 11.9 g/dL — ABNORMAL LOW (ref 12.0–15.0)
MCH: 30.6 pg (ref 26.0–34.0)
MCHC: 32.3 g/dL (ref 30.0–36.0)
MCV: 94.6 fL (ref 80.0–100.0)
Platelets: 75 10*3/uL — ABNORMAL LOW (ref 150–400)
RBC: 3.89 MIL/uL (ref 3.87–5.11)
RDW: 14.9 % (ref 11.5–15.5)
WBC: 4.8 10*3/uL (ref 4.0–10.5)
nRBC: 0 % (ref 0.0–0.2)

## 2019-04-16 LAB — ECHOCARDIOGRAM COMPLETE
Height: 67 in
Weight: 2240 oz

## 2019-04-16 LAB — GLUCOSE, CAPILLARY: Glucose-Capillary: 85 mg/dL (ref 70–99)

## 2019-04-16 MED ORDER — SODIUM CHLORIDE 0.9 % IV SOLN: INTRAVENOUS | Status: DC

## 2019-04-16 MED ORDER — DICLOFENAC SODIUM 1 % EX GEL
2.0000 g | Freq: Four times a day (QID) | CUTANEOUS | Status: DC
  Administered 2019-04-16 – 2019-04-18 (×7): 2 g via TOPICAL
  Filled 2019-04-16 (×2): qty 100

## 2019-04-16 MED ORDER — ONDANSETRON HCL 4 MG/2ML IJ SOLN
4.0000 mg | Freq: Four times a day (QID) | INTRAMUSCULAR | Status: DC | PRN
  Administered 2019-04-16: 4 mg via INTRAVENOUS
  Filled 2019-04-16: qty 2

## 2019-04-16 MED ORDER — SODIUM CHLORIDE 0.9 % IV SOLN
1.0000 g | INTRAVENOUS | Status: AC
  Administered 2019-04-16 – 2019-04-18 (×3): 1 g via INTRAVENOUS
  Filled 2019-04-16 (×2): qty 1
  Filled 2019-04-16: qty 10

## 2019-04-16 NOTE — Evaluation (Signed)
Occupational Therapy Evaluation Patient Details Name: [name] MRN: [id number] DOB: 06/30/1929 Today's Date: 04/16/2019    History of Present Illness 86yo female s/p fall at home when not using RW. All imaging negative for fracture or acute injury. PMH CVA 08/2018, A-fib, L atrial thrombus on Eliquis   Clinical Impression   Patient admitted for above and limited by problem list below, including impaired balance, generalized weakness, decreased activity tolerance and impaired safety awareness/cognition.  Patient following commands with increased time, requires cueing for correct hand placement during transfers, and demonstrates decreased STM, awareness of deficits and safety.  She currently requires min-total assist for ADLs, min guard for transfers and min guard assist for bed mobility.  Daughter present during session and very supportive.  Reporting PTA she lives with her son, who does not assist with ADLs, therefore she has her daughter and 2 caregivers assist PRN (for a few hours) but no overnight assist.  She has a BSC but forgets/doesn't want to use it, therefore has been walking to the bathroom independently PTA. Daughter voices concern for needing increased aide assist at dc (especially at night).  Reinforced safety with patient, needing assist for all transfers and mobility at this time.  Recommend continued OT services while admitted and after dc at [facility] level, given 24/7 assist, in order to optimize independence and safety with ADls, mobility.     Follow Up Recommendations  Home health OT;Supervision/Assistance - 24 hour;Other (comment)(HH aide (max services) )    Equipment Recommendations  None recommended by OT    Recommendations for Other Services       Precautions / Restrictions Precautions Precautions: Fall Restrictions Weight Bearing Restrictions: No      Mobility Bed Mobility Overal bed mobility: Needs Assistance Bed Mobility: Sit to Supine     Supine to sit:  Min guard;HOB elevated Sit to supine: Min guard   General bed mobility comments: for safety and balance, increased time and effort   Transfers Overall transfer level: Needs assistance Equipment used: Rolling walker (2 wheeled) Transfers: Sit to/from Stand Sit to Stand: Min guard         General transfer comment: min guard for safety, cues for hand placement and sequencing    Balance Overall balance assessment: Needs assistance;History of Falls Sitting-balance support: No upper extremity supported;Feet supported Sitting balance-Leahy Scale: Good Sitting balance - Comments: S for safety statically   Standing balance support: Bilateral upper extremity supported;No upper extremity supported;During functional activity Standing balance-Leahy Scale: Poor Standing balance comment: relaint on BUE support dynaimcally, min assist statically and 0 hand support (washing hands) with mild posterior lean                           ADL either performed or assessed with clinical judgement   ADL Overall ADL's : Needs assistance/impaired     Grooming: Minimal assistance;Wash/dry hands;Standing   Upper Body Bathing: Sitting;Minimal assistance   Lower Body Bathing: Maximal assistance;Sit to/from stand   Upper Body Dressing : Sitting;Moderate assistance   Lower Body Dressing: Total assistance;Sit to/from stand   Toilet Transfer: Minimal assistance;Ambulation;RW;BSC   Toileting- Water quality scientist and Hygiene: Sit to/from stand;Moderate assistance Toileting - Clothing Manipulation Details (indicate cue type and reason): clothing mgmt and balance during hygiene      Functional mobility during ADLs: Rolling walker;Minimal assistance;Cueing for safety;Cueing for sequencing General ADL Comments: pt limited by weakness, impaired balance, and decreased activity tolerance  Vision         Perception     Praxis      Pertinent Vitals/Pain Pain Assessment: Faces Pain  Score: 6  Faces Pain Scale: Hurts little more Pain Location: head, knee and shoulder Pain Descriptors / Indicators: Aching;Sore Pain Intervention(s): Limited activity within patient's tolerance;Monitored during session;Repositioned     Hand Dominance     Extremity/Trunk Assessment Upper Extremity Assessment Upper Extremity Assessment: Generalized weakness(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, essential tremors )   Lower Extremity Assessment Lower Extremity Assessment: Defer to PT evaluation   Cervical / Trunk Assessment Cervical / Trunk Assessment: Kyphotic   Communication Communication Communication: Expressive difficulties   Cognition Arousal/Alertness: Awake/alert Behavior During Therapy: WFL for tasks assessed/performed Overall Cognitive Status: Impaired/Different from baseline Area of Impairment: Safety/judgement;Awareness;Problem solving                     Memory: Decreased short-term memory   Safety/Judgement: Decreased awareness of safety;Decreased awareness of deficits Awareness: Emergent Problem Solving: Slow processing;Difficulty sequencing;Requires verbal cues General Comments: pt with decreased awareness of safety and deficits, decreased STM; daughter present and reports she is at baseline cognition    General Comments  daughter present and supportive, but concerned about nighttime assist after dc     Exercises     Shoulder Instructions      Home Living Family/patient expects to be discharged to:: Private residence Living Arrangements: Children Available Help at Discharge: Family;Available 24 hours/day Type of Home: House Home Access: Stairs to enter [employer] of Steps: 1 with B rails, then split level but she does not go downstairs Entrance Stairs-Rails: Can reach both Home Layout: Multi-level;Able to live on main level with bedroom/bathroom     Bathroom Shower/Tub: Occupational psychologist: Handicapped height      Home Equipment: Clinical cytogeneticist - 4 wheels;Grab bars - tub/shower;Grab bars - toilet;Bedside commode;Walker - 2 wheels   Additional Comments: son lives with pt but does not assist with ADLs, daughter and 2 caregivers assist with ADLS but no night time assist       Prior Functioning/Environment Level of Independence: Needs assistance  Gait / Transfers Assistance Needed: uses RW with mobility, typically has ming-spv (does get up to bathroom independently at night)  ADL's / Homemaking Assistance Needed: some assist with ADLs, independent eating/brushing teeth (does get up to bathroom without assist at night)    Comments: daughter reports son will not assist with ADLs, pt has BSC for nighttime toileting but does not use it         OT Problem List: Decreased strength;Decreased activity tolerance;Impaired balance (sitting and/or standing);Decreased cognition;Decreased safety awareness;Decreased knowledge of use of DME or AE;Decreased knowledge of precautions;Impaired UE functional use;Decreased coordination      OT Treatment/Interventions: Self-care/ADL training;Energy conservation;DME and/or AE instruction;Therapeutic activities;Cognitive remediation/compensation;Patient/family education;Balance training;Therapeutic exercise    OT Goals(Current goals can be found in the care plan section) Acute Rehab OT Goals Patient Stated Goal: go home OT Goal Formulation: With patient Time For Goal Achievement: 04/30/19 Potential to Achieve Goals: Good  OT Frequency: Min 2X/week   Barriers to D/C:            Co-evaluation              AM-PAC OT "6 Clicks" Daily Activity     Outcome Measure Help from another person eating meals?: A Little Help from another person taking care of personal grooming?: A Little Help from another person toileting, which  includes using toliet, bedpan, or urinal?: A Lot Help from another person bathing (including washing, rinsing, drying)?: A Lot Help from  another person to put on and taking off regular upper body clothing?: A Lot Help from another person to put on and taking off regular lower body clothing?: Total 6 Click Score: 13   End of Session Equipment Utilized During Treatment: Rolling walker;Gait belt Nurse Communication: Mobility status;Precautions;Other (comment)(pure wick vs getting OOB to [facility] with assist )  Activity Tolerance: Patient tolerated treatment well Patient left: in bed;with call bell/phone within reach;with bed alarm set;with family/visitor present  OT Visit Diagnosis: Other abnormalities of gait and mobility (R26.89);Muscle weakness (generalized) (M62.81);History of falling (Z91.81);Other symptoms and signs involving cognitive function                Time: 1241-1312 OT Time Calculation (min): 31 min Charges:  OT General Charges $OT Visit: 1 Visit OT Evaluation $OT Eval Moderate Complexity: 1 Mod OT Treatments $Self Care/Home Management : 8-22 mins  Jolaine Artist, OT Acute Rehabilitation Services Pager [phone] Office [phone]   Delight Stare 04/16/2019, 1:35 PM

## 2019-04-16 NOTE — Progress Notes (Deleted)
Progress Note    [name]  [id number] DOB: [birth year]  DOA: 04/15/2019 PCP: Lawerance Cruel, MD    Brief Narrative:     Medical records reviewed and are as summarized below:  [name] is an 86 y.o. female with medical history significant of recent embolic stroke in July [id number], A. fib, with left atrium thrombosis, chronic ambulatory dysfunction, baseline using walker to ambulate, presented with episode of fall.  Patient and her son both reported that patient forgot to use her walker this morning, and fell down in the bathroom.  Denied any lightheadedness, no blurred vision no chest pain, no short of breath, denied any weakness or numbness of any of the limbs.  Son was not able to pull her up and then called 911.  She remembered properly hit her head against the wall, then developed headache and pain on the side of her face, also complaining about right shoulder and right knee pain.   She denied any loss of consciousness.  The son reported that the patient was hospitalized July 2020 for episode of new onset A. fib, and and later pacemaker was placed.  But then patient developed urosepsis and then bacteremia.  Then also had a TEE showing 2 small clots in her heart, and eventually patient was started on Eliquis for that.   Assessment/Plan:   Active Problems:   Fall at home, initial encounter   Fall   Head injury    Mechanical fall with chronic ambulation dysfunction Patient  denied patient any prodrome of syncope Given the extensive history of atrium thrombosis and stroke, will order a TTE Pain control PT/OT -Gentle IV fluids until orthostatic blood pressures were checked  Scalp hematoma secondary to fall -Monitor for worsening  History of stroke underlying A. fib and atrium thrombosis -Resume Eliquis in the a.m. if hemoglobin stable  History of left atrium thrombosis -Resume Eliquis in the a.m. if stable  Chronic A. fib with sick sinus Rate  controlled, on pacemaker Hold Eliquis for today, if H&H stable resume tomorrow morning  HTN -Orthostatic vital signs pending  UTI  -Culture ordered -Start IV Rocephin -Patient eating less than 50% of meals  Thrombocytopenia -Per chart review appears chronic -DC heparin -Restart Eliquis in the a.m. if H&H stable  Hyperkalemia -Repeat BMP this morning as unclear  Family Communication/Anticipated D/C date and plan/Code Status   DVT prophylaxis: SCDs, resume Eliquis in the a.m. Code Status: DNR (discussed at length with patient) Family Communication: Spoke with son for an extended period of time, he is unable to provide 24/7 care at home and is interested in [facility]:    None.     Subjective:   Patient states she does not desire to have chest compressions or to be intubated  Objective:    Vitals:   04/16/19 0130 04/16/19 0543 04/16/19 0917 04/16/19 1639  BP: (!) 148/77 133/72 128/68 132/72  Pulse: 84 62 66 68  Resp: 18 18 18 18   Temp: 97.9 F (36.6 C) (!) 97.5 F (36.4 C) 97.7 F (36.5 C) 98 F (36.7 C)  TempSrc: Oral Oral Oral Oral  SpO2: 98% 94% 96% 96%  Weight:      Height:        Intake/Output Summary (Last 24 hours) at 04/16/2019 2059 Last data filed at 04/16/2019 1300 Gross per 24 hour  Intake 240 ml  Output 875 ml  Net -635 ml   [employer]  04/15/19 1300  Weight: 63.5 kg    Exam: In bed, no acute distress Hematoma over left eye Pleasant cooperative, alert and oriented x3 Irregular  Data Reviewed:   I have personally reviewed following labs and imaging studies:  Labs: Labs show the following:   Basic Metabolic Panel: Recent Labs  Lab 04/15/19 1310 04/15/19 1310 04/15/19 1316 04/16/19 1039  NA 142  --  139 142  K 3.8   < > 5.3* 4.2  CL 105  --  105 108  CO2 28  --   --  30  GLUCOSE 96  --  89 108*  BUN 10  --  14 13  CREATININE 0.98  --  0.80 0.91  CALCIUM 10.3  --   --  10.0   < > = values  in this interval not displayed.   GFR Estimated Creatinine Clearance: 42.4 mL/min (by C-G formula based on SCr of 0.91 mg/dL). Liver Function Tests: Recent Labs  Lab 04/15/19 1310  AST 28  ALT 16  ALKPHOS 49  BILITOT 1.3*  PROT 5.4*  ALBUMIN 3.5   No results for input(s): LIPASE, AMYLASE in the last 168 hours. No results for input(s): AMMONIA in the last 168 hours. Coagulation profile No results for input(s): INR, PROTIME in the last 168 hours.  CBC: Recent Labs  Lab 04/15/19 1310 04/15/19 1316 04/16/19 0521  WBC 4.5  --  4.8  NEUTROABS 2.9  --   --   HGB 13.0 13.9 11.9*  HCT 41.8 41.0 36.8  MCV 97.9  --  94.6  PLT 87*  --  75*   Cardiac Enzymes: No results for input(s): CKTOTAL, CKMB, CKMBINDEX, TROPONINI in the last 168 hours. BNP (last 3 results) No results for input(s): PROBNP in the last 8760 hours. CBG: Recent Labs  Lab 04/16/19 0340  GLUCAP 85   D-Dimer: No results for input(s): DDIMER in the last 72 hours. Hgb A1c: No results for input(s): HGBA1C in the last 72 hours. Lipid Profile: No results for input(s): CHOL, HDL, LDLCALC, TRIG, CHOLHDL, LDLDIRECT in the last 72 hours. Thyroid function studies: No results for input(s): TSH, T4TOTAL, T3FREE, THYROIDAB in the last 72 hours.  Invalid input(s): FREET3 Anemia work up: No results for input(s): VITAMINB12, FOLATE, FERRITIN, TIBC, IRON, RETICCTPCT in the last 72 hours. Sepsis Labs: Recent Labs  Lab 04/15/19 1310 04/16/19 0521  WBC 4.5 4.8    Microbiology Recent Results (from the past 240 hour(s))  SARS CORONAVIRUS 2 (TAT 6-24 HRS) Nasopharyngeal Nasopharyngeal Swab     Status: None   Collection Time: 04/15/19  4:54 PM   Specimen: Nasopharyngeal Swab  Result Value Ref Range Status   SARS Coronavirus 2 NEGATIVE NEGATIVE Final    Comment: (NOTE) SARS-CoV-2 target nucleic acids are NOT DETECTED. The SARS-CoV-2 RNA is generally detectable in upper and lower respiratory specimens during the acute  phase of infection. Negative results do not preclude SARS-CoV-2 infection, do not rule out co-infections with other pathogens, and should not be used as the sole basis for treatment or other patient management decisions. Negative results must be combined with clinical observations, patient history, and epidemiological information. The expected result is Negative. Fact Sheet for Patients: SugarRoll.be Fact Sheet for Healthcare Providers: https://www.woods-mathews.com/ This test is not yet approved or cleared by the [place] FDA and  has been authorized for detection and/or diagnosis of SARS-CoV-2 by FDA under an Emergency Use Authorization (EUA). This EUA will remain  in effect (meaning this test can be  used) for the duration of the COVID-19 declaration under Section 56 4(b)(1) of the Act, 21 U.S.C. section 360bbb-3(b)(1), unless the authorization is terminated or revoked sooner. Performed at [facility], [place] [address] [place], [place] [address]     Procedures and diagnostic studies:  DG Knee 2 Views Right  Result Date: 04/15/2019 CLINICAL DATA:  Trauma secondary to a fall. EXAM: RIGHT KNEE - 1-2 VIEW COMPARISON:  None. FINDINGS: There is no fracture or dislocation or joint effusion. 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 Chondrocalcinosis. IMPRESSION: No acute abnormality. Arthritic changes as described. Electronically Signed   By: Lorriane Shire M.D.   On: 04/15/2019 13:23   CT Head Wo Contrast  Result Date: 04/15/2019 CLINICAL DATA:  Posttraumatic headache and neck pain after trauma. EXAM: CT HEAD WITHOUT CONTRAST CT CERVICAL SPINE WITHOUT CONTRAST TECHNIQUE: Multidetector CT imaging of the head and cervical spine was performed following the standard protocol without intravenous contrast. Multiplanar CT image reconstructions of the cervical spine were also generated. COMPARISON:  February 11, 2018.  November 02, 2018. FINDINGS: CT HEAD FINDINGS Brain: Mild diffuse cortical atrophy is noted. Mild chronic ischemic white matter disease is noted. No mass effect or midline shift is noted. Ventricular size is within normal limits. There is no evidence of mass lesion, hemorrhage or acute infarction. Vascular: No hyperdense vessel or unexpected calcification. Skull: Normal. Negative for fracture or focal lesion. Sinuses/Orbits: No acute finding. Other: Mild right posterior parietal scalp hematoma is noted. CT CERVICAL SPINE FINDINGS Alignment: Mild grade 1 retrolisthesis of C5-6 is noted secondary to degenerative disc disease at this level. Skull base and vertebrae: No acute fracture. No primary bone lesion or focal pathologic process. Soft tissues and spinal canal: No prevertebral fluid or swelling. No visible canal hematoma. Disc levels: Severe degenerative disc disease is noted at C5-6 with anterior posterior osteophyte formation. Upper chest: Negative. Other: None. IMPRESSION: 1. Mild right posterior parietal scalp hematoma. Mild diffuse cortical atrophy. Mild chronic ischemic white matter disease. No acute intracranial abnormality seen. 2. Multilevel degenerative disc disease. No acute abnormality seen in the cervical spine. Electronically Signed   By: Marijo Conception M.D.   On: 04/15/2019 14:28   CT Chest W Contrast  Result Date: 04/15/2019 CLINICAL DATA:  Abdominal trauma. Ground level fall on blood thinners. EXAM: CT CHEST, ABDOMEN, AND PELVIS WITH CONTRAST TECHNIQUE: Multidetector CT imaging of the chest, abdomen and pelvis was performed following the standard protocol during bolus administration of intravenous contrast. CONTRAST:  131mL OMNIPAQUE IOHEXOL 300 MG/ML  SOLN COMPARISON:  None available FINDINGS: CT CHEST FINDINGS Cardiovascular: No significant vascular findings. Normal heart size. No pericardial effusion. Heart is enlarged. Mediastinum/Nodes: No axillary or supraclavicular adenopathy. No  mediastinal or hilar adenopathy esophagus normal. Trachea normal Lungs/Pleura: No pneumothorax. Small bilateral pleural effusions greater on the RIGHT. Airways normal. Musculoskeletal: No rib fracture. No scapular fracture CT ABDOMEN AND PELVIS FINDINGS Hepatobiliary: Benign cyst in the RIGHT hepatic lobe. Low-density lesion LEFT hepatic lobe is is favored benign cysts all the not fully characterize. No hepatic laceration. Pancreas: Pancreas is normal. No ductal dilatation. No pancreatic inflammation. Spleen: Heterogeneous perfusion of the spleen. No splenic laceration. Adrenals/urinary tract: Adrenal glands normal. No evidence of renal cortical injury. Partially exophytic round lesion extending from the RIGHT kidney is high-density on noncontrast and postcontrast imaging most consistent with benign hemorrhagic cyst. Simple fluid attenuation cyst extending from the lower pole of the RIGHT kidney. Bladder is intact Stomach/Bowel: Stomach, small bowel, appendix,  and cecum are normal. The colon and rectosigmoid colon are normal. The descending colon does enter a LEFT inguinal hernia sac. The bowel is nonobstructed. The sac is relatively large measuring 7.8 x 2.0 cm (image 98/8). Distal rectosigmoid colon normal. There are several diverticula sigmoid colon. Vascular/Lymphatic: Abdominal aorta is normal caliber. There is no retroperitoneal or periportal lymphadenopathy. No pelvic lymphadenopathy. Reproductive: Post hysterectomy Other: No free fluid. Musculoskeletal: No pelvic fracture spine fracture IMPRESSION: Chest. 1. No evidence of thoracic trauma. 2. Small bilateral pleural effusions. 3. Cardiomegaly. Abdomen / Pelvis. 1. No evidence of solid organ injury in the abdomen pelvis. 2. Large LEFT inguinal hernia sac contains nonobstructed descending colon. 3. Mixed density renal cysts are favored benign hemorrhagic and nonhemorrhagic cysts. 4. Heterogeneous perfusion of the spleen is favored benign. No evidence of  splenic trauma. Electronically Signed   By: Suzy Bouchard M.D.   On: 04/15/2019 14:38   CT Cervical Spine Wo Contrast  Result Date: 04/15/2019 CLINICAL DATA:  Posttraumatic headache and neck pain after trauma. EXAM: CT HEAD WITHOUT CONTRAST CT CERVICAL SPINE WITHOUT CONTRAST TECHNIQUE: Multidetector CT imaging of the head and cervical spine was performed following the standard protocol without intravenous contrast. Multiplanar CT image reconstructions of the cervical spine were also generated. COMPARISON:  February 11, 2018. November 02, 2018. FINDINGS: CT HEAD FINDINGS Brain: Mild diffuse cortical atrophy is noted. Mild chronic ischemic white matter disease is noted. No mass effect or midline shift is noted. Ventricular size is within normal limits. There is no evidence of mass lesion, hemorrhage or acute infarction. Vascular: No hyperdense vessel or unexpected calcification. Skull: Normal. Negative for fracture or focal lesion. Sinuses/Orbits: No acute finding. Other: Mild right posterior parietal scalp hematoma is noted. CT CERVICAL SPINE FINDINGS Alignment: Mild grade 1 retrolisthesis of C5-6 is noted secondary to degenerative disc disease at this level. Skull base and vertebrae: No acute fracture. No primary bone lesion or focal pathologic process. Soft tissues and spinal canal: No prevertebral fluid or swelling. No visible canal hematoma. Disc levels: Severe degenerative disc disease is noted at C5-6 with anterior posterior osteophyte formation. Upper chest: Negative. Other: None. IMPRESSION: 1. Mild right posterior parietal scalp hematoma. Mild diffuse cortical atrophy. Mild chronic ischemic white matter disease. No acute intracranial abnormality seen. 2. Multilevel degenerative disc disease. No acute abnormality seen in the cervical spine. Electronically Signed   By: Marijo Conception M.D.   On: 04/15/2019 14:28   CT Abdomen Pelvis W Contrast  Result Date: 04/15/2019 CLINICAL DATA:  Abdominal trauma.  Ground level fall on blood thinners. EXAM: CT CHEST, ABDOMEN, AND PELVIS WITH CONTRAST TECHNIQUE: Multidetector CT imaging of the chest, abdomen and pelvis was performed following the standard protocol during bolus administration of intravenous contrast. CONTRAST:  173mL OMNIPAQUE IOHEXOL 300 MG/ML  SOLN COMPARISON:  None available FINDINGS: CT CHEST FINDINGS Cardiovascular: No significant vascular findings. Normal heart size. No pericardial effusion. Heart is enlarged. Mediastinum/Nodes: No axillary or supraclavicular adenopathy. No mediastinal or hilar adenopathy esophagus normal. Trachea normal Lungs/Pleura: No pneumothorax. Small bilateral pleural effusions greater on the RIGHT. Airways normal. Musculoskeletal: No rib fracture. No scapular fracture CT ABDOMEN AND PELVIS FINDINGS Hepatobiliary: Benign cyst in the RIGHT hepatic lobe. Low-density lesion LEFT hepatic lobe is is favored benign cysts all the not fully characterize. No hepatic laceration. Pancreas: Pancreas is normal. No ductal dilatation. No pancreatic inflammation. Spleen: Heterogeneous perfusion of the spleen. No splenic laceration. Adrenals/urinary tract: Adrenal glands normal. No evidence of renal cortical injury. Partially exophytic  round lesion extending from the RIGHT kidney is high-density on noncontrast and postcontrast imaging most consistent with benign hemorrhagic cyst. Simple fluid attenuation cyst extending from the lower pole of the RIGHT kidney. Bladder is intact Stomach/Bowel: Stomach, small bowel, appendix, and cecum are normal. The colon and rectosigmoid colon are normal. The descending colon does enter a LEFT inguinal hernia sac. The bowel is nonobstructed. The sac is relatively large measuring 7.8 x 2.0 cm (image 98/8). Distal rectosigmoid colon normal. There are several diverticula sigmoid colon. Vascular/Lymphatic: Abdominal aorta is normal caliber. There is no retroperitoneal or periportal lymphadenopathy. No pelvic  lymphadenopathy. Reproductive: Post hysterectomy Other: No free fluid. Musculoskeletal: No pelvic fracture spine fracture IMPRESSION: Chest. 1. No evidence of thoracic trauma. 2. Small bilateral pleural effusions. 3. Cardiomegaly. Abdomen / Pelvis. 1. No evidence of solid organ injury in the abdomen pelvis. 2. Large LEFT inguinal hernia sac contains nonobstructed descending colon. 3. Mixed density renal cysts are favored benign hemorrhagic and nonhemorrhagic cysts. 4. Heterogeneous perfusion of the spleen is favored benign. No evidence of splenic trauma. Electronically Signed   By: Suzy Bouchard M.D.   On: 04/15/2019 14:38   ECHOCARDIOGRAM COMPLETE  Result Date: 04/16/2019    ECHOCARDIOGRAM REPORT   Patient Name:   [name] Date of Exam: 04/16/2019 Medical Rec #:  [id number]         Height:       67.0 in Accession #:    [id number]        Weight:       140.0 lb Date of Birth:  [birth year]         BSA:          1.738 m Patient Age:    29 years          BP:           128/68 mmHg Patient Gender: F                 HR:           68 bpm. Exam Location:  Inpatient Procedure: 2D Echo, Color Doppler and Cardiac Doppler Indications:    R01.1 Murmur  History:        Patient has prior history of Echocardiogram examinations, most                 recent 09/05/2018. CHF, Pacemaker, Arrythmias:Atrial                 Fibrillation; Risk Factors:Hypertension and Dyslipidemia. 68mm                 Biocor Mitral Valve Replacement in 2011.                  Mitral Valve: 29 mm Biocor bioprosthetic valve valve is present                 in the mitral position. Procedure Date: 2011.  Sonographer:    Raquel Sarna Senior RDCS Referring Phys: [id number] Lequita Halt  Sonographer Comments: Technically difficult apical window due to small rib spacing. IMPRESSIONS  1. Left ventricular ejection fraction, by estimation, is 50 to 55%. The left ventricle has low normal function. The left ventricle has no regional wall motion abnormalities. Left  ventricular diastolic function could not be evaluated. Left ventricular diastolic function could not be evaluated.  2. Right ventricular systolic function is normal. The right ventricular size is mildly enlarged. There is moderately elevated pulmonary artery systolic pressure.  3. Left atrial size was severely dilated.  4. Right atrial size was severely dilated.  5. The mitral valve has been repaired/replaced. No evidence of mitral valve regurgitation. Mild mitral stenosis. The mean mitral valve gradient is 7.0 mmHg with average heart rate of 70 bpm. There is a 29 mm Biocor bioprosthetic valve present in the mitral position. Procedure Date: 2011. Echo findings are consistent with normal structure and function of the mitral valve prosthesis.  6. Tricuspid valve regurgitation is moderate.  7. The aortic valve is tricuspid. Aortic valve regurgitation is mild. Mild aortic valve sclerosis is present, with no evidence of aortic valve stenosis.  8. There is borderline dilatation of the ascending aorta measuring 37 mm.  9. The inferior vena cava is dilated in size with >50% respiratory variability, suggesting right atrial pressure of 8 mmHg. Comparison(s): Changes from prior study are noted. Left ventricular systolic function is lower, but there are no regional wall motion abnormalities. There is no change in mitral prosthesis gradients, allowing for changes in heart rate. FINDINGS  Left Ventricle: Left ventricular ejection fraction, by estimation, is 50 to 55%. The left ventricle has low normal function. The left ventricle has no regional wall motion abnormalities. The left ventricular internal cavity size was normal in size. There is no left ventricular hypertrophy. Abnormal (paradoxical) septal motion consistent with post-operative status. Left ventricular diastolic function could not be evaluated due to mitral valve replacement. Left ventricular diastolic function could not be evaluated. Right Ventricle: The right  ventricular size is mildly enlarged. No increase in right ventricular wall thickness. Right ventricular systolic function is normal. There is moderately elevated pulmonary artery systolic pressure. The tricuspid regurgitant velocity is 3.24 m/s, and with an assumed right atrial pressure of 8 mmHg, the estimated right ventricular systolic pressure is [id number] mmHg. Left Atrium: Left atrial size was severely dilated. Right Atrium: Right atrial size was severely dilated. Pericardium: There is no evidence of pericardial effusion. Mitral Valve: The mitral valve has been repaired/replaced. No evidence of mitral valve regurgitation. There is a 29 mm Biocor bioprosthetic valve present in the mitral position. Procedure Date: 2011. Echo findings are consistent with normal structure and  function of the mitral valve prosthesis. Mild mitral valve stenosis. MV peak gradient, 18.0 mmHg. The mean mitral valve gradient is 7.0 mmHg with average heart rate of 70 bpm. Tricuspid Valve: The tricuspid valve is normal in structure. Tricuspid valve regurgitation is moderate. Aortic Valve: The aortic valve is tricuspid. Aortic valve regurgitation is mild. Mild aortic valve sclerosis is present, with no evidence of aortic valve stenosis. Pulmonic Valve: The pulmonic valve was normal in structure. Pulmonic valve regurgitation is mild to moderate. Aorta: The aortic root is normal in size and structure. There is borderline dilatation of the ascending aorta measuring 37 mm. Venous: The inferior vena cava is dilated in size with greater than 50% respiratory variability, suggesting right atrial pressure of 8 mmHg. IAS/Shunts: No atrial level shunt detected by color flow Doppler. Additional Comments: A pacer wire is visualized.  LEFT VENTRICLE PLAX 2D LVIDd:         4.90 cm LVIDs:         3.60 cm LV PW:         1.20 cm LV IVS:        0.80 cm LVOT diam:     1.90 cm LV SV:         46 LV SV Index:   26 LVOT Area:     2.84  cm  RIGHT VENTRICLE RV S prime:      6.66 cm/s TAPSE (M-mode): 1.2 cm LEFT ATRIUM              Index        RIGHT ATRIUM           Index LA diam:        5.50 cm  3.16 cm/m   RA Area:     36.50 cm LA Vol (A2C):   158.0 ml 90.92 ml/m  RA Volume:   147.00 ml 84.59 ml/m LA Vol (A4C):   179.0 ml 103.00 ml/m LA Biplane Vol: 179.0 ml 103.00 ml/m  AORTIC VALVE LVOT Vmax:   88.10 cm/s LVOT Vmean:  56.000 cm/s LVOT VTI:    0.162 m  AORTA Ao Root diam: 3.10 cm Ao Asc diam:  3.70 cm MITRAL VALVE              TRICUSPID VALVE MV Area (PHT): 1.53 cm   TR Peak grad:   42.0 mmHg MV Peak grad:  18.0 mmHg  TR Vmax:        324.00 cm/s MV Mean grad:  7.0 mmHg MV Vmax:       2.12 m/s   SHUNTS MV Vmean:      120.0 cm/s Systemic VTI:  0.16 m                           Systemic Diam: 1.90 cm Dani Gobble Croitoru MD Electronically signed by Sanda Klein MD Signature Date/Time: 04/16/2019/5:02:52 PM    Final     Medications:   . acidophilus  1 capsule Oral Daily  . vitamin C  500 mg Oral Daily  . B-complex with vitamin C  1 tablet Oral Daily  . cholecalciferol  2,000 Units Oral Daily  . diclofenac Sodium  2 g Topical QID  . donepezil  5 mg Oral QHS  . irbesartan  150 mg Oral Daily  . lidocaine  1 patch Transdermal Q24H  . pantoprazole  40 mg Oral Daily  . simvastatin  10 mg Oral Daily   Continuous Infusions: . sodium chloride 50 mL/hr at 04/16/19 1213  . cefTRIAXone (ROCEPHIN)  IV 1 g (04/16/19 1036)     LOS: 0 days   Geradine Girt  [facility]   How to contact the [facility] Attending or Consulting provider [place] or covering provider during after hours [place], for this patient?  1. Check the care team in [facility] and look for a) attending/consulting [facility] provider listed and b) the [facility] team listed 2. Log into [email or web address] and use [place]'s universal password to access. If you do not have the password, please contact the hospital operator. 3. Locate the [facility] provider you are looking for under [facility] and page to a number that you can  be directly reached. 4. If you still have difficulty reaching the provider, please page the [facility] (Director on Call) for the Hospitalists listed on amion for assistance.  04/16/2019, 8:59 PM

## 2019-04-16 NOTE — Plan of Care (Signed)
  Problem: Education: Goal: Knowledge of General Education information will improve Description Including pain rating scale, medication(s)/side effects and non-pharmacologic comfort measures Outcome: Progressing   Problem: Health Behavior/Discharge Planning: Goal: Ability to manage health-related needs will improve Outcome: Progressing   

## 2019-04-16 NOTE — Progress Notes (Signed)
Progress Note    [name]  [id number] DOB: 06-27-1929  DOA: 04/15/2019 PCP: Lawerance Cruel, MD    Brief Narrative:     Medical records reviewed and are as summarized below:  [name] is an 86 y.o. female with medical history significant of recent embolic stroke in July [id number], A. fib, with left atrium thrombosis, chronic ambulatory dysfunction, baseline using walker to ambulate, presented with episode of fall.  Patient and her son both reported that patient forgot to use her walker this morning, and fell down in the bathroom.  Denied any lightheadedness, no blurred vision no chest pain, no short of breath, denied any weakness or numbness of any of the limbs.  Son was not able to pull her up and then called 911.  She remembered properly hit her head against the wall, then developed headache and pain on the side of her face, also complaining about right shoulder and right knee pain.   She denied any loss of consciousness.  The son reported that the patient was hospitalized July 2020 for episode of new onset A. fib, and and later pacemaker was placed.  But then patient developed urosepsis and then bacteremia.  Then also had a TEE showing 2 small clots in her heart, and eventually patient was started on Eliquis for that.   Assessment/Plan:   Active Problems:   Fall at home, initial encounter   Fall   Head injury    Mechanical fall with chronic ambulation dysfunction Patient  denied patient any prodrome of syncope Given the extensive history of atrium thrombosis and stroke, will order a TTE Pain control PT/OT -Gentle IV fluids until orthostatic blood pressures were checked  Scalp hematoma secondary to fall -Monitor for worsening  History of stroke underlying A. fib and atrium thrombosis -Resume Eliquis in the a.m. if hemoglobin stable  History of left atrium thrombosis -Resume Eliquis in the a.m. if stable  Chronic A. fib with sick sinus Rate  controlled, on pacemaker Hold Eliquis for today, if H&H stable resume tomorrow morning  HTN -Orthostatic vital signs pending  UTI  -Culture ordered -Start IV Rocephin -Patient eating less than 50% of meals  Thrombocytopenia -Per chart review appears chronic -DC heparin -Restart Eliquis in the a.m. if H&H stable  Hyperkalemia -Repeat BMP this morning as unclear  Family Communication/Anticipated D/C date and plan/Code Status   DVT prophylaxis: SCDs, resume Eliquis in the a.m. Code Status: DNR (discussed at length with patient) Family Communication: Spoke with son for an extended period of time, he is unable to provide 24/7 care at home and is interested in [place] [facility]:    None.     Subjective:   Patient states she does not desire to have chest compressions or to be intubated  Objective:    Vitals:   04/15/19 2342 04/16/19 0130 04/16/19 0543 04/16/19 0917  BP: (!) 145/86 (!) 148/77 133/72 128/68  Pulse: 66 84 62 66  Resp: 18 18 18 18   Temp: 97.9 F (36.6 C) 97.9 F (36.6 C) (!) 97.5 F (36.4 C) 97.7 F (36.5 C)  TempSrc: Oral Oral Oral Oral  SpO2: 98% 98% 94% 96%  Weight:      Height:        Intake/Output Summary (Last 24 hours) at 04/16/2019 1147 Last data filed at 04/16/2019 0900 Gross per 24 hour  Intake 120 ml  Output 875 ml  Net -755 ml   [employer]  04/15/19 1300  Weight: 63.5 kg    Exam: In bed, no acute distress Hematoma over left eye Pleasant cooperative, alert and oriented x3 Irregular  Data Reviewed:   I have personally reviewed following labs and imaging studies:  Labs: Labs show the following:   Basic Metabolic Panel: Recent Labs  Lab 04/15/19 1310 04/15/19 1316  NA 142 139  K 3.8 5.3*  CL 105 105  CO2 28  --   GLUCOSE 96 89  BUN 10 14  CREATININE 0.98 0.80  CALCIUM 10.3  --    GFR Estimated Creatinine Clearance: 48.2 mL/min (by C-G formula based on SCr of 0.8 mg/dL). Liver Function  Tests: Recent Labs  Lab 04/15/19 1310  AST 28  ALT 16  ALKPHOS 49  BILITOT 1.3*  PROT 5.4*  ALBUMIN 3.5   No results for input(s): LIPASE, AMYLASE in the last 168 hours. No results for input(s): AMMONIA in the last 168 hours. Coagulation profile No results for input(s): INR, PROTIME in the last 168 hours.  CBC: Recent Labs  Lab 04/15/19 1310 04/15/19 1316 04/16/19 0521  WBC 4.5  --  4.8  NEUTROABS 2.9  --   --   HGB 13.0 13.9 11.9*  HCT 41.8 41.0 36.8  MCV 97.9  --  94.6  PLT 87*  --  75*   Cardiac Enzymes: No results for input(s): CKTOTAL, CKMB, CKMBINDEX, TROPONINI in the last 168 hours. BNP (last 3 results) No results for input(s): PROBNP in the last 8760 hours. CBG: Recent Labs  Lab 04/16/19 0340  GLUCAP 85   D-Dimer: No results for input(s): DDIMER in the last 72 hours. Hgb A1c: No results for input(s): HGBA1C in the last 72 hours. Lipid Profile: No results for input(s): CHOL, HDL, LDLCALC, TRIG, CHOLHDL, LDLDIRECT in the last 72 hours. Thyroid function studies: No results for input(s): TSH, T4TOTAL, T3FREE, THYROIDAB in the last 72 hours.  Invalid input(s): FREET3 Anemia work up: No results for input(s): VITAMINB12, FOLATE, FERRITIN, TIBC, IRON, RETICCTPCT in the last 72 hours. Sepsis Labs: Recent Labs  Lab 04/15/19 1310 04/16/19 0521  WBC 4.5 4.8    Microbiology Recent Results (from the past 240 hour(s))  SARS CORONAVIRUS 2 (TAT 6-24 HRS) Nasopharyngeal Nasopharyngeal Swab     Status: None   Collection Time: 04/15/19  4:54 PM   Specimen: Nasopharyngeal Swab  Result Value Ref Range Status   SARS Coronavirus 2 NEGATIVE NEGATIVE Final    Comment: (NOTE) SARS-CoV-2 target nucleic acids are NOT DETECTED. The SARS-CoV-2 RNA is generally detectable in upper and lower respiratory specimens during the acute phase of infection. Negative results do not preclude SARS-CoV-2 infection, do not rule out co-infections with other pathogens, and should not  be used as the sole basis for treatment or other patient management decisions. Negative results must be combined with clinical observations, patient history, and epidemiological information. The expected result is Negative. Fact Sheet for Patients: SugarRoll.be Fact Sheet for Healthcare Providers: https://www.woods-mathews.com/ This test is not yet approved or cleared by the [place] FDA and  has been authorized for detection and/or diagnosis of SARS-CoV-2 by FDA under an Emergency Use Authorization (EUA). This EUA will remain  in effect (meaning this test can be used) for the duration of the COVID-19 declaration under Section 56 4(b)(1) of the Act, 21 U.S.C. section 360bbb-3(b)(1), unless the authorization is terminated or revoked sooner. Performed at [facility], [place] [address], [place], [place] [address]     Procedures and diagnostic studies:  DG Knee 2 Views Right  Result Date: 04/15/2019 CLINICAL DATA:  Trauma secondary to a fall. EXAM: RIGHT KNEE - 1-2 VIEW COMPARISON:  None. FINDINGS: There is no fracture or dislocation or joint effusion. 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 Chondrocalcinosis. IMPRESSION: No acute abnormality. Arthritic changes as described. Electronically Signed   By: Lorriane Shire M.D.   On: 04/15/2019 13:23   CT Head Wo Contrast  Result Date: 04/15/2019 CLINICAL DATA:  Posttraumatic headache and neck pain after trauma. EXAM: CT HEAD WITHOUT CONTRAST CT CERVICAL SPINE WITHOUT CONTRAST TECHNIQUE: Multidetector CT imaging of the head and cervical spine was performed following the standard protocol without intravenous contrast. Multiplanar CT image reconstructions of the cervical spine were also generated. COMPARISON:  February 11, 2018. November 02, 2018. FINDINGS: CT HEAD FINDINGS Brain: Mild diffuse cortical atrophy is noted. Mild chronic ischemic white matter disease is  noted. No mass effect or midline shift is noted. Ventricular size is within normal limits. There is no evidence of mass lesion, hemorrhage or acute infarction. Vascular: No hyperdense vessel or unexpected calcification. Skull: Normal. Negative for fracture or focal lesion. Sinuses/Orbits: No acute finding. Other: Mild right posterior parietal scalp hematoma is noted. CT CERVICAL SPINE FINDINGS Alignment: Mild grade 1 retrolisthesis of C5-6 is noted secondary to degenerative disc disease at this level. Skull base and vertebrae: No acute fracture. No primary bone lesion or focal pathologic process. Soft tissues and spinal canal: No prevertebral fluid or swelling. No visible canal hematoma. Disc levels: Severe degenerative disc disease is noted at C5-6 with anterior posterior osteophyte formation. Upper chest: Negative. Other: None. IMPRESSION: 1. Mild right posterior parietal scalp hematoma. Mild diffuse cortical atrophy. Mild chronic ischemic white matter disease. No acute intracranial abnormality seen. 2. Multilevel degenerative disc disease. No acute abnormality seen in the cervical spine. Electronically Signed   By: Marijo Conception M.D.   On: 04/15/2019 14:28   CT Chest W Contrast  Result Date: 04/15/2019 CLINICAL DATA:  Abdominal trauma. Ground level fall on blood thinners. EXAM: CT CHEST, ABDOMEN, AND PELVIS WITH CONTRAST TECHNIQUE: Multidetector CT imaging of the chest, abdomen and pelvis was performed following the standard protocol during bolus administration of intravenous contrast. CONTRAST:  112mL OMNIPAQUE IOHEXOL 300 MG/ML  SOLN COMPARISON:  None available FINDINGS: CT CHEST FINDINGS Cardiovascular: No significant vascular findings. Normal heart size. No pericardial effusion. Heart is enlarged. Mediastinum/Nodes: No axillary or supraclavicular adenopathy. No mediastinal or hilar adenopathy esophagus normal. Trachea normal Lungs/Pleura: No pneumothorax. Small bilateral pleural effusions greater on the  RIGHT. Airways normal. Musculoskeletal: No rib fracture. No scapular fracture CT ABDOMEN AND PELVIS FINDINGS Hepatobiliary: Benign cyst in the RIGHT hepatic lobe. Low-density lesion LEFT hepatic lobe is is favored benign cysts all the not fully characterize. No hepatic laceration. Pancreas: Pancreas is normal. No ductal dilatation. No pancreatic inflammation. Spleen: Heterogeneous perfusion of the spleen. No splenic laceration. Adrenals/urinary tract: Adrenal glands normal. No evidence of renal cortical injury. Partially exophytic round lesion extending from the RIGHT kidney is high-density on noncontrast and postcontrast imaging most consistent with benign hemorrhagic cyst. Simple fluid attenuation cyst extending from the lower pole of the RIGHT kidney. Bladder is intact Stomach/Bowel: Stomach, small bowel, appendix, and cecum are normal. The colon and rectosigmoid colon are normal. The descending colon does enter a LEFT inguinal hernia sac. The bowel is nonobstructed. The sac is relatively large measuring 7.8 x 2.0 cm (image 98/8). Distal rectosigmoid colon normal. There are several diverticula sigmoid colon. Vascular/Lymphatic: Abdominal  aorta is normal caliber. There is no retroperitoneal or periportal lymphadenopathy. No pelvic lymphadenopathy. Reproductive: Post hysterectomy Other: No free fluid. Musculoskeletal: No pelvic fracture spine fracture IMPRESSION: Chest. 1. No evidence of thoracic trauma. 2. Small bilateral pleural effusions. 3. Cardiomegaly. Abdomen / Pelvis. 1. No evidence of solid organ injury in the abdomen pelvis. 2. Large LEFT inguinal hernia sac contains nonobstructed descending colon. 3. Mixed density renal cysts are favored benign hemorrhagic and nonhemorrhagic cysts. 4. Heterogeneous perfusion of the spleen is favored benign. No evidence of splenic trauma. Electronically Signed   By: Suzy Bouchard M.D.   On: 04/15/2019 14:38   CT Cervical Spine Wo Contrast  Result Date:  04/15/2019 CLINICAL DATA:  Posttraumatic headache and neck pain after trauma. EXAM: CT HEAD WITHOUT CONTRAST CT CERVICAL SPINE WITHOUT CONTRAST TECHNIQUE: Multidetector CT imaging of the head and cervical spine was performed following the standard protocol without intravenous contrast. Multiplanar CT image reconstructions of the cervical spine were also generated. COMPARISON:  February 11, 2018. November 02, 2018. FINDINGS: CT HEAD FINDINGS Brain: Mild diffuse cortical atrophy is noted. Mild chronic ischemic white matter disease is noted. No mass effect or midline shift is noted. Ventricular size is within normal limits. There is no evidence of mass lesion, hemorrhage or acute infarction. Vascular: No hyperdense vessel or unexpected calcification. Skull: Normal. Negative for fracture or focal lesion. Sinuses/Orbits: No acute finding. Other: Mild right posterior parietal scalp hematoma is noted. CT CERVICAL SPINE FINDINGS Alignment: Mild grade 1 retrolisthesis of C5-6 is noted secondary to degenerative disc disease at this level. Skull base and vertebrae: No acute fracture. No primary bone lesion or focal pathologic process. Soft tissues and spinal canal: No prevertebral fluid or swelling. No visible canal hematoma. Disc levels: Severe degenerative disc disease is noted at C5-6 with anterior posterior osteophyte formation. Upper chest: Negative. Other: None. IMPRESSION: 1. Mild right posterior parietal scalp hematoma. Mild diffuse cortical atrophy. Mild chronic ischemic white matter disease. No acute intracranial abnormality seen. 2. Multilevel degenerative disc disease. No acute abnormality seen in the cervical spine. Electronically Signed   By: Marijo Conception M.D.   On: 04/15/2019 14:28   CT Abdomen Pelvis W Contrast  Result Date: 04/15/2019 CLINICAL DATA:  Abdominal trauma. Ground level fall on blood thinners. EXAM: CT CHEST, ABDOMEN, AND PELVIS WITH CONTRAST TECHNIQUE: Multidetector CT imaging of the chest,  abdomen and pelvis was performed following the standard protocol during bolus administration of intravenous contrast. CONTRAST:  141mL OMNIPAQUE IOHEXOL 300 MG/ML  SOLN COMPARISON:  None available FINDINGS: CT CHEST FINDINGS Cardiovascular: No significant vascular findings. Normal heart size. No pericardial effusion. Heart is enlarged. Mediastinum/Nodes: No axillary or supraclavicular adenopathy. No mediastinal or hilar adenopathy esophagus normal. Trachea normal Lungs/Pleura: No pneumothorax. Small bilateral pleural effusions greater on the RIGHT. Airways normal. Musculoskeletal: No rib fracture. No scapular fracture CT ABDOMEN AND PELVIS FINDINGS Hepatobiliary: Benign cyst in the RIGHT hepatic lobe. Low-density lesion LEFT hepatic lobe is is favored benign cysts all the not fully characterize. No hepatic laceration. Pancreas: Pancreas is normal. No ductal dilatation. No pancreatic inflammation. Spleen: Heterogeneous perfusion of the spleen. No splenic laceration. Adrenals/urinary tract: Adrenal glands normal. No evidence of renal cortical injury. Partially exophytic round lesion extending from the RIGHT kidney is high-density on noncontrast and postcontrast imaging most consistent with benign hemorrhagic cyst. Simple fluid attenuation cyst extending from the lower pole of the RIGHT kidney. Bladder is intact Stomach/Bowel: Stomach, small bowel, appendix, and cecum are normal. The colon and rectosigmoid  colon are normal. The descending colon does enter a LEFT inguinal hernia sac. The bowel is nonobstructed. The sac is relatively large measuring 7.8 x 2.0 cm (image 98/8). Distal rectosigmoid colon normal. There are several diverticula sigmoid colon. Vascular/Lymphatic: Abdominal aorta is normal caliber. There is no retroperitoneal or periportal lymphadenopathy. No pelvic lymphadenopathy. Reproductive: Post hysterectomy Other: No free fluid. Musculoskeletal: No pelvic fracture spine fracture IMPRESSION: Chest. 1. No  evidence of thoracic trauma. 2. Small bilateral pleural effusions. 3. Cardiomegaly. Abdomen / Pelvis. 1. No evidence of solid organ injury in the abdomen pelvis. 2. Large LEFT inguinal hernia sac contains nonobstructed descending colon. 3. Mixed density renal cysts are favored benign hemorrhagic and nonhemorrhagic cysts. 4. Heterogeneous perfusion of the spleen is favored benign. No evidence of splenic trauma. Electronically Signed   By: Suzy Bouchard M.D.   On: 04/15/2019 14:38    Medications:   . acidophilus  1 capsule Oral Daily  . vitamin C  500 mg Oral Daily  . B-complex with vitamin C  1 tablet Oral Daily  . cholecalciferol  2,000 Units Oral Daily  . diclofenac Sodium  2 g Topical QID  . donepezil  5 mg Oral QHS  . irbesartan  150 mg Oral Daily  . lidocaine  1 patch Transdermal Q24H  . pantoprazole  40 mg Oral Daily  . simvastatin  10 mg Oral Daily   Continuous Infusions: . cefTRIAXone (ROCEPHIN)  IV 1 g (04/16/19 1036)     LOS: 0 days   Geradine Girt  [facility]   How to contact the [facility] Attending or Consulting provider [place] or covering provider during after hours [place], for this patient?  1. Check the care team in [facility] and look for a) attending/consulting [facility] provider listed and b) the [facility] team listed 2. Log into [email or web address] and use [place]'s universal password to access. If you do not have the password, please contact the hospital operator. 3. Locate the [facility] provider you are looking for under [facility] and page to a number that you can be directly reached. 4. If you still have difficulty reaching the provider, please page the [facility] (Director on Call) for the Hospitalists listed on amion for assistance.  04/16/2019, 11:47 AM

## 2019-04-16 NOTE — Progress Notes (Signed)
Echocardiogram 2D Echocardiogram has been performed.  Oneal Deputy Kade Demicco 04/16/2019, 1:59 PM

## 2019-04-16 NOTE — Progress Notes (Signed)
Nurse reported son had a question about DNR status. Called and had to LM as he did not answer.  Spoke with patient regarding differences between DNR and full code and asked what her wishes were, she stated that she would not want CPR/intubation if her heart were to stop. Will attempt to speak with son again later today to inform him of his mother's wishes. Eulogio Bear DO

## 2019-04-16 NOTE — Evaluation (Addendum)
Physical Therapy Evaluation Patient Details Name: [name] MRN: [id number] DOB: [birth year] Today's Date: 04/16/2019   History of Present Illness  86yo female s/p fall at home when not using RW. All imaging negative for fracture or acute injury. PMH CVA 08/2018, A-fib, L atrial thrombus on Eliquis  Clinical Impression   Patient received in bed, pleasant and willing to work with therapy today. See below for mobility/assist levels, also vitals section for orthostatics taken today. Generally able to perform all mobility today with S- min guard and RW, however very weak and easily fatigued- unable to even stand for 3 minutes for full formal orthostatics check. Does seem to have impaired insight into deficits, kept asking about walking in hallway despite being fatigued from just standing beside the bed/inability to stand for much more than a minute this morning. She was left up in the chair with all needs met, chair alarm active and nursing staff aware of patient status. Feel she could return home as long as famiily/caregivers are able to provide 24/7A, otherwise would need SNF.   Patient suffers from impaired functional strength and activity tolerance which impairs their ability to perform daily activities like ambulate  in the home.  A walker alone will not resolve the issues with performing activities of daily living. A wheelchair will allow patient to safely perform daily activities.  The patient can self propel in the home or has a caregiver who can provide assistance.        Follow Up Recommendations Home health PT;Supervision/Assistance - 24 hour    Equipment Recommendations  Rolling walker with 5" wheels;3in1 (PT);Wheelchair (measurements PT);Wheelchair cushion (measurements PT)    Recommendations for Other Services       Precautions / Restrictions Precautions Precautions: Fall Restrictions Weight Bearing Restrictions: No      Mobility  Bed Mobility Overal bed mobility: Needs  Assistance Bed Mobility: Supine to Sit     Supine to sit: Min guard;HOB elevated     General bed mobility comments: increased time and effort, encouragement  Transfers Overall transfer level: Needs assistance Equipment used: Rolling walker (2 wheeled) Transfers: Sit to/from Stand Sit to Stand: Min guard         General transfer comment: min guard for safety, cues for hand placement and sequencing  Ambulation/Gait Ambulation/Gait assistance: Min guard Gait Distance (Feet): 5 Feet Assistive device: Rolling walker (2 wheeled) Gait Pattern/deviations: Step-through pattern;Decreased step length - right;Decreased step length - left;Decreased stride length;Decreased dorsiflexion - left;Decreased dorsiflexion - right;Trunk flexed;Narrow base of support Gait velocity: decreased   General Gait Details: slow but steady with RW, easily fatigued; does have essential tremor and has a baseline level of shaking  Stairs            Wheelchair Mobility    Modified Rankin (Stroke Patients Only)       Balance Overall balance assessment: Needs assistance;History of Falls Sitting-balance support: Bilateral upper extremity supported;Feet supported Sitting balance-Leahy Scale: Good Sitting balance - Comments: S for safety statically   Standing balance support: Bilateral upper extremity supported;During functional activity Standing balance-Leahy Scale: Poor Standing balance comment: heavy reliance on external support, hx of falls                             Pertinent Vitals/Pain Pain Assessment: 0-10 Pain Score: 6  Pain Location: head, knee and shoulder Pain Descriptors / Indicators: Aching;Sore Pain Intervention(s): Limited activity within patient's tolerance;Monitored during session  Home Living Family/patient expects to be discharged to:: Private residence Living Arrangements: Children Available Help at Discharge: Family;Available 24 hours/day Type of Home:  House Home Access: Stairs to enter Entrance Stairs-Rails: Can reach both Entrance Stairs-Number of Steps: 1 with B rails, then split level but she does not go downstairs Home Layout: Multi-level;Able to live on main level with bedroom/bathroom Home Equipment: Shower seat;Walker - 4 wheels;Grab bars - tub/shower;Grab bars - toilet Additional Comments: reports she also has care aides that stay with her- one durign the day and one at night    Prior Function Level of Independence: Independent with assistive device(s)               Hand Dominance        Extremity/Trunk Assessment   Upper Extremity Assessment Upper Extremity Assessment: Generalized weakness    Lower Extremity Assessment Lower Extremity Assessment: Generalized weakness    Cervical / Trunk Assessment Cervical / Trunk Assessment: Kyphotic  Communication   Communication: Expressive difficulties(mumbles and can be difficult to understand at times)  Cognition Arousal/Alertness: Awake/alert Behavior During Therapy: WFL for tasks assessed/performed Overall Cognitive Status: No family/caregiver present to determine baseline cognitive functioning Area of Impairment: Memory;Safety/judgement;Problem solving                     Memory: Decreased short-term memory   Safety/Judgement: Decreased awareness of safety;Decreased awareness of deficits   Problem Solving: Slow processing;Requires verbal cues;Difficulty sequencing General Comments: poor insight into deficits- wanted to go walk in the hallway even though she was very weak and only able to stand for 1 minute today, fatigued after getting to chair.      General Comments General comments (skin integrity, edema, etc.): see vitals section for orthostatics    Exercises     Assessment/Plan    PT Assessment Patient needs continued PT services  PT Problem List Decreased strength;Decreased mobility;Decreased safety awareness;Decreased coordination;Decreased  activity tolerance;Decreased balance       PT Treatment Interventions DME instruction;Therapeutic activities;Gait training;Therapeutic exercise;Patient/family education;Stair training;Balance training;Functional mobility training;Neuromuscular re-education    PT Goals (Current goals can be found in the Care Plan section)  Acute Rehab PT Goals Patient Stated Goal: go home PT Goal Formulation: With patient Time For Goal Achievement: 04/30/19 Potential to Achieve Goals: Good    Frequency Min 3X/week   Barriers to discharge        Co-evaluation               AM-PAC PT "6 Clicks" Mobility  Outcome Measure Help needed turning from your back to your side while in a flat bed without using bedrails?: None Help needed moving from lying on your back to sitting on the side of a flat bed without using bedrails?: A Little Help needed moving to and from a bed to a chair (including a wheelchair)?: A Little Help needed standing up from a chair using your arms (e.g., wheelchair or bedside chair)?: A Little Help needed to walk in hospital room?: A Little Help needed climbing 3-5 steps with a railing? : A Lot 6 Click Score: 18    End of Session   Activity Tolerance: Patient tolerated treatment well;Patient limited by fatigue Patient left: in chair;with call bell/phone within reach;with chair alarm set Nurse Communication: Mobility status;Other (comment)(orthostatics, trying to transfer to/from [facility] instead of using purewick) PT Visit Diagnosis: Unsteadiness on feet (R26.81);Muscle weakness (generalized) (M62.81);History of falling (Z91.81);Difficulty in walking, not elsewhere classified (R26.2)    Time: 7564-3329 PT Time  Calculation (min) (ACUTE ONLY): 32 min   Charges:   PT Evaluation $PT Eval Moderate Complexity: 1 Mod PT Treatments $Therapeutic Activity: 8-22 mins        Windell Norfolk, DPT, PN1   Supplemental Physical Therapist [place]    Pager [phone] Acute  Rehab Office [phone]

## 2019-04-17 DIAGNOSIS — Y92009 Unspecified place in unspecified non-institutional (private) residence as the place of occurrence of the external cause: Secondary | ICD-10-CM | POA: Diagnosis not present

## 2019-04-17 DIAGNOSIS — E785 Hyperlipidemia, unspecified: Secondary | ICD-10-CM | POA: Diagnosis present

## 2019-04-17 DIAGNOSIS — W19XXXA Unspecified fall, initial encounter: Secondary | ICD-10-CM | POA: Diagnosis not present

## 2019-04-17 DIAGNOSIS — B9620 Unspecified Escherichia coli [E. coli] as the cause of diseases classified elsewhere: Secondary | ICD-10-CM | POA: Diagnosis present

## 2019-04-17 DIAGNOSIS — D649 Anemia, unspecified: Secondary | ICD-10-CM | POA: Diagnosis not present

## 2019-04-17 DIAGNOSIS — I10 Essential (primary) hypertension: Secondary | ICD-10-CM | POA: Diagnosis present

## 2019-04-17 DIAGNOSIS — E875 Hyperkalemia: Secondary | ICD-10-CM | POA: Diagnosis present

## 2019-04-17 DIAGNOSIS — S8001XA Contusion of right knee, initial encounter: Secondary | ICD-10-CM | POA: Diagnosis present

## 2019-04-17 DIAGNOSIS — Z952 Presence of prosthetic heart valve: Secondary | ICD-10-CM | POA: Diagnosis not present

## 2019-04-17 DIAGNOSIS — Z86718 Personal history of other venous thrombosis and embolism: Secondary | ICD-10-CM | POA: Diagnosis not present

## 2019-04-17 DIAGNOSIS — Z7901 Long term (current) use of anticoagulants: Secondary | ICD-10-CM | POA: Diagnosis not present

## 2019-04-17 DIAGNOSIS — S0990XA Unspecified injury of head, initial encounter: Secondary | ICD-10-CM | POA: Diagnosis not present

## 2019-04-17 DIAGNOSIS — I77810 Thoracic aortic ectasia: Secondary | ICD-10-CM | POA: Diagnosis present

## 2019-04-17 DIAGNOSIS — D696 Thrombocytopenia, unspecified: Secondary | ICD-10-CM | POA: Diagnosis present

## 2019-04-17 DIAGNOSIS — M5030 Other cervical disc degeneration, unspecified cervical region: Secondary | ICD-10-CM | POA: Diagnosis present

## 2019-04-17 DIAGNOSIS — R269 Unspecified abnormalities of gait and mobility: Secondary | ICD-10-CM | POA: Diagnosis present

## 2019-04-17 DIAGNOSIS — S0003XA Contusion of scalp, initial encounter: Secondary | ICD-10-CM | POA: Diagnosis present

## 2019-04-17 DIAGNOSIS — W1830XA Fall on same level, unspecified, initial encounter: Secondary | ICD-10-CM | POA: Diagnosis present

## 2019-04-17 DIAGNOSIS — I088 Other rheumatic multiple valve diseases: Secondary | ICD-10-CM | POA: Diagnosis present

## 2019-04-17 DIAGNOSIS — Y92002 Bathroom of unspecified non-institutional (private) residence single-family (private) house as the place of occurrence of the external cause: Secondary | ICD-10-CM | POA: Diagnosis not present

## 2019-04-17 DIAGNOSIS — Z20822 Contact with and (suspected) exposure to covid-19: Secondary | ICD-10-CM | POA: Diagnosis present

## 2019-04-17 DIAGNOSIS — I482 Chronic atrial fibrillation, unspecified: Secondary | ICD-10-CM | POA: Diagnosis present

## 2019-04-17 DIAGNOSIS — Z8673 Personal history of transient ischemic attack (TIA), and cerebral infarction without residual deficits: Secondary | ICD-10-CM | POA: Diagnosis not present

## 2019-04-17 DIAGNOSIS — N390 Urinary tract infection, site not specified: Secondary | ICD-10-CM | POA: Diagnosis present

## 2019-04-17 DIAGNOSIS — M25511 Pain in right shoulder: Secondary | ICD-10-CM | POA: Diagnosis present

## 2019-04-17 DIAGNOSIS — Z66 Do not resuscitate: Secondary | ICD-10-CM | POA: Diagnosis present

## 2019-04-17 DIAGNOSIS — Z79899 Other long term (current) drug therapy: Secondary | ICD-10-CM | POA: Diagnosis not present

## 2019-04-17 LAB — BASIC METABOLIC PANEL
Anion gap: 7 (ref 5–15)
BUN: 16 mg/dL (ref 8–23)
CO2: 27 mmol/L (ref 22–32)
Calcium: 9.5 mg/dL (ref 8.9–10.3)
Chloride: 109 mmol/L (ref 98–111)
Creatinine, Ser: 0.98 mg/dL (ref 0.44–1.00)
GFR calc Af Amer: >60 mL/min (ref >60)
GFR calc non Af Amer: 52 mL/min — ABNORMAL LOW (ref >60)
Glucose, Bld: 86 mg/dL (ref 70–99)
Potassium: 4 mmol/L (ref 3.5–5.1)
Sodium: 143 mmol/L (ref 135–145)

## 2019-04-17 LAB — CBC
HCT: 35.7 % — ABNORMAL LOW (ref 36.0–46.0)
Hemoglobin: 11.4 g/dL — ABNORMAL LOW (ref 12.0–15.0)
MCH: 30.3 pg (ref 26.0–34.0)
MCHC: 31.9 g/dL (ref 30.0–36.0)
MCV: 94.9 fL (ref 80.0–100.0)
Platelets: 71 10*3/uL — ABNORMAL LOW (ref 150–400)
RBC: 3.76 MIL/uL — ABNORMAL LOW (ref 3.87–5.11)
RDW: 15.1 % (ref 11.5–15.5)
WBC: 4.3 10*3/uL (ref 4.0–10.5)
nRBC: 0 % (ref 0.0–0.2)

## 2019-04-17 LAB — IRON AND TIBC
Iron: 29 ug/dL (ref 28–170)
Saturation Ratios: 9 % — ABNORMAL LOW (ref 10.4–31.8)
TIBC: 339 ug/dL (ref 250–450)
UIBC: 310 ug/dL

## 2019-04-17 LAB — FERRITIN: Ferritin: 48 ng/mL (ref 11–307)

## 2019-04-17 LAB — RETICULOCYTES
Immature Retic Fract: 6.6 % (ref 2.3–15.9)
RBC.: 3.87 MIL/uL (ref 3.87–5.11)
Retic Count, Absolute: 28.6 10*3/uL (ref 19.0–186.0)
Retic Ct Pct: 0.7 % (ref 0.4–3.1)

## 2019-04-17 LAB — VITAMIN B12: Vitamin B-12: 341 pg/mL (ref 180–914)

## 2019-04-17 MED ORDER — ENOXAPARIN SODIUM 40 MG/0.4ML ~~LOC~~ SOLN
40.0000 mg | SUBCUTANEOUS | Status: DC
  Administered 2019-04-17: 40 mg via SUBCUTANEOUS
  Filled 2019-04-17: qty 0.4

## 2019-04-17 NOTE — TOC Initial Note (Signed)
Transition of Care [facility]) - Initial/Assessment Note    Patient Details  Name: [name] MRN: [id number] Date of Birth: [birth year]  Transition of Care [facility]) CM/SW Contact:    Sable Feil, LCSW Phone Number: 04/17/2019, 4:54 PM  Clinical Narrative:  CSW talked initially by phone with daughter Suzi Roots [phone]) and later by phone with son Preksha Tempel [phone]) regarding patient's discharge disposition and PT/OT recommendation of SNF/Home with [facility] services & 24/7 supervision/assistance. Ms. Owens Shark reported that they (she and her brother)  are trying to put something in place for their mother, in terms of 24/7 care. CSW advised that her brother Yvone Neu lives with their mother and he is divorced and is on disability for back problems. Daughter reported that her brother prepares the meals and gives their mother her medication and she fills the pill box and takes care of personal care with her mom. Daughter cited an instance that her mom went to the bathroom and left walker outside of the bathroom, and when she left the bathroom she did not get the walker that she left in the bathroom and ended up falling.  SNF placement as an option for rehab was discussed and daughter reported that her mom has been to SNF's before, and added that she has been to [facility] (July, August 2020) and they really liked this facility. This SNF option was discussed and daughter indicated that she will try tor each Lindajo Royal, admissions director. CSW also advised daughter that contact will be made with [facility]. Call made to [facility] after talking with daughter and message left. Received call from [place] (2:35 pm) and informed CSW that no rehab beds available.  Received a call from patient's son Yvone Neu (3:05 pm) and he was informed that [facility] has no rehab bed available. Discussed patient's current status with rehab and informed him of PT's recommendation. Son reported that they want to bring their mother  home and will put 24/7 care in place. CSW advised that the 24/7 aide services are through [facility] and they want to use [facility] for PT/OT/RN and speech. CSW advised that they want to use [facility] and [facility] for PT, Ebony Hail for speech and Manuela Schwartz for OT as their mom is familiar with these people.               Expected Discharge Plan: Skilled Nursing Facility(Plan was SNF vs Home with home health) Barriers to Discharge: Continued Medical Work up   Patient Goals and CMS Choice Patient states their goals for this hospitalization and ongoing recovery are:: Daughter Joelene Millin and Yvone Neu want patient to get therapy and other [facility] services in a safe environment CMS Medicare.gov Compare Post Acute Care list provided to:: Other (Comment Required)(Not discussed as family had only one facility they would consent to for ST rehb - [place]) Choice offered to / list presented to : NA(Family only wanted one facility and advised CSW that the other option would be home with [facility] services)  Expected Discharge Plan and Services Expected Discharge Plan: Skilled Nursing Facility(Plan was SNF vs Home with home health) In-house Referral: Clinical Social Work Discharge Planning Services: Other - See comment(HH services) Post Acute Care Choice: [place] arrangements for the past 2 months: [place]                   DME Agency: Other - Comment([facility] for aide services and [facility] for PT/OT.RN,Speech)  Prior Living Arrangements/Services Living arrangements for the past 2 months: [place] with:: Adult Children(Mr. Amiira Lier lives with the patient) Patient language and need for interpreter reviewed:: No Do you feel safe going back to the place where you live?: Yes(Family comfortable with patient being home with 24/7 aide services through [facility])      Need for Family Participation in Patient Care: Yes (Comment) Care giver  support system in place?: Yes (comment) Current home services: [facility](Aide with patient 7 days a week, 12:30-5 pm and 5 pm-8pm) Criminal Activity/Legal Involvement Pertinent to Current Situation/Hospitalization: No - Comment as needed  Activities of Daily Living      Permission Sought/Granted Permission sought to share information with : Family Supports(Spoke directly with daughter and son) Permission granted to share information with : No(Daughter requested that CSW contact her)              Emotional Assessment Appearance:: Appears stated age Attitude/Demeanor/Rapport: Unable to Assess Affect (typically observed): Unable to Assess Orientation: : Oriented to Self, Oriented to Place, Oriented to  Time, Oriented to Situation Alcohol / Substance Use: Tobacco Use, Alcohol Use, Illicit Drugs(Information re: tobacco, alcohol and illicit drug use not on file) Psych Involvement: No (comment)  Admission diagnosis:  Fall [W19.XXXA] Hx of long term use of blood thinners [Z92.29] Injury of head, initial encounter [S09.90XA] Fall, initial encounter [W19.XXXA] Ambulatory dysfunction [R26.2] Hematoma of right knee region [S80.01XA] Patient Active Problem List   Diagnosis Date Noted  . Fall at home, initial encounter 04/15/2019  . Fall 04/15/2019  . Head injury    PCP:  Lawerance Cruel, MD Pharmacy:   [id number] - [place], [place] [address] Phone: [phone] Fax: [phone]     Social Determinants of Health (SDOH) Interventions  No SDOH interventions requested or needed at this time   Readmission Risk Interventions No flowsheet data found.

## 2019-04-17 NOTE — Plan of Care (Signed)
  Problem: Clinical Measurements: Goal: Ability to maintain clinical measurements within normal limits will improve Outcome: Progressing   Problem: Coping: Goal: Level of anxiety will decrease Outcome: Progressing   

## 2019-04-17 NOTE — Progress Notes (Signed)
Occupational Therapy Treatment Patient Details Name: [name] MRN: [id number] DOB: 03/11/1929 Today's Date: 04/17/2019    History of present illness 86yo female s/p fall at home when not using RW. All imaging negative for fracture or acute injury. PMH CVA 08/2018, A-fib, L atrial thrombus on Eliquis   OT comments  Ambulated to sink for oral care with min A overall. Tolerated standing for a prolonged period with min guard assist for safety  Follow Up Recommendations  Home health OT;Supervision/Assistance - 24 hour;Other (comment)(rec snf if this cannot be provided)    Equipment Recommendations       Recommendations for Other Services      Precautions / Restrictions Precautions Precautions: Fall Restrictions Weight Bearing Restrictions: No       Mobility Bed Mobility                  Transfers   Equipment used: Rolling walker (2 wheeled)   Sit to Stand: Min guard              Balance     Sitting balance-Leahy Scale: Good       Standing balance-Leahy Scale: Poor                             ADL either performed or assessed with clinical judgement   ADL       Grooming: Oral care;Minimal assistance;Standing                   Toilet Transfer: Minimal assistance;Ambulation;RW(simulated, bed)             General ADL Comments: pt stood for an extended period of time to perform oral care, including 2 plates.  Offered seated rest break but she declined.  She had completed adl on night shift per student RN and had already toileted.  Purewick replaced     Manufacturing systems engineer      Cognition Arousal/Alertness: Awake/alert Behavior During Therapy: WFL for tasks assessed/performed Overall Cognitive Status: Impaired/Different from baseline(based on eval with daughter present)                                 General Comments: able to follow all one step commands; cues for safety         Exercises     Shoulder Instructions       General Comments      Pertinent Vitals/ Pain       Pain Assessment: No/denies pain  Home Living                                          Prior Functioning/Environment              Frequency  Min 2X/week        Progress Toward Goals  OT Goals(current goals can now be found in the care plan section)  Progress towards OT goals: Progressing toward goals     Plan      Co-evaluation                 AM-PAC OT "6 Clicks" Daily Activity     Outcome Measure   Help from another person eating meals?: A Little  Help from another person taking care of personal grooming?: A Little Help from another person toileting, which includes using toliet, bedpan, or urinal?: A Lot Help from another person bathing (including washing, rinsing, drying)?: A Lot Help from another person to put on and taking off regular upper body clothing?: A Lot Help from another person to put on and taking off regular lower body clothing?: Total 6 Click Score: 13    End of Session    OT Visit Diagnosis: Other abnormalities of gait and mobility (R26.89);Muscle weakness (generalized) (M62.81);History of falling (Z91.81);Other symptoms and signs involving cognitive function   Activity Tolerance Patient tolerated treatment well   Patient Left in bed;with call bell/phone within reach;with bed alarm set   Nurse Communication          Time: [phone] OT Time Calculation (min): 28 min  Charges: OT General Charges $OT Visit: 1 Visit OT Treatments $Self Care/Home Management : 23-37 mins  Merlie Noga S, OTR/L Acute Rehabilitation Services 04/17/2019   [place] 04/17/2019, 9:08 AM

## 2019-04-17 NOTE — Progress Notes (Signed)
PROGRESS NOTE    [name]  [id number] DOB: 1929/11/17 DOA: 04/15/2019 PCP: Lawerance Cruel, MD   Brief Narrative:  [name] is an 86 y.o. female with medical history significant ofrecent embolic stroke in July [id number], A. fib, with left atrium thrombosis, chronic ambulatory dysfunction, baseline using walker to ambulate, presented with episode of fall.Patient and her son both reported that patient forgot to use her walker this morning, and fell down in the bathroom. Denied any lightheadedness, no blurred vision no chest pain, no short of breath, denied any weakness or numbness of any of the limbs. Son was not able to pull her up and then called 911.She remembered properly hit her head against the wall, then developedheadache and pain on the side of her face,also complaining aboutright shoulderandright kneepain.She denied any loss of consciousness.  The son reported that the patient was hospitalized July 2020 for episode of new onset A. fib, and and later pacemaker was placed. But then patient developed urosepsis and then bacteremia. Then also had a TEEshowing2 small clots in her heart, and eventually patient was started on Eliquis for that.  Assessment & Plan:   Active Problems:   Fall at home, initial encounter   Fall   Head injury   Mechanical fall with chronic ambulation dysfunction: Patient  denied symptoms of any prodrome of syncope.  Continue current pain management.  Seen by PT OT and they recommended either 24-hour supervision with home health or SNF.  I talked to patient's daughter Rosalyn Gess over the phone, she tells me that although patient's son lives with her but he is not much of a help so technically she does not have 24-hour supervision.  She said that she will start looking for some help at night but also requested [place] to start looking for SNF.  TOC consulted.  Scalp hematoma secondary to fall: Stable.  Monitor.  History of  stroke underlying A. fib and atrium thrombosis: On Eliquis at home which is on hold.  We will continue to hold due to drop in hemoglobin.  History of left atrium thrombosis: Transthoracic echo does not show any more thrombosis.  Eliquis on hold due to dropping hemoglobin.  Chronic A. fib with sick sinus Rate controlled, on pacemaker Hold Eliquis for today, if H&H stable resume tomorrow morning  HTN: Stable.  Continue current management. -Orthostatic vital signs pending  UTI : Urine culture growing E. coli.  Final sensitivities pending.  Continue Rocephin.   Chronic thrombocytopenia: Stable and no active bleeding.  Monitor.  Hyperkalemia: Resolved.  Acute anemia: Dropped hemoglobin from 13 on 04/15/2019 to 11.4 today.  No active bleeding.  Will check fecal occult blood test.  Hold Eliquis for now.  COVID-19 vaccine: Her daughter tells me that patient is due for her second shot of COVID-19 vaccine.  Will arrange second dose here.  DVT prophylaxis: Will start on Lovenox for DVT prophylaxis, pharmacy consulted Code Status: DNR Family Communication:  None present at bedside.  Spoke to daughter as detailed above Patient is from: Home Disposition Plan: Either home with 24-hour supervision or SNF Barriers to discharge: Drop in hemoglobin/acute anemia, needs further work-up and monitoring.   Estimated body mass index is 21.93 kg/m as calculated from the following:   Height as of this encounter: 5\' 7"  (1.702 m).   Weight as of this encounter: 63.5 kg.      Nutritional status:  Consultants:   None  Procedures:   None  Antimicrobials:   Rocephin   Subjective: Patient seen and examined.  She has no complaints.  Objective: Vitals:   04/16/19 1639 04/16/19 2215 04/17/19 0636 04/17/19 0800  BP: 132/72 131/64 138/74 130/76  Pulse: 68 71 85 74  Resp: 18 16 18  (!) 24  Temp: 98 F (36.7 C) 98.2 F (36.8 C) 98.2 F (36.8 C) 97.6 F (36.4 C)   TempSrc: Oral Oral Oral Axillary  SpO2: 96% 93% 94% 94%  Weight:      Height:        Intake/Output Summary (Last 24 hours) at 04/17/2019 1132 Last data filed at 04/17/2019 [id number] Gross per 24 hour  Intake 1122.15 ml  Output 1400 ml  Net -277.85 ml   Filed Weights   04/15/19 1300  Weight: 63.5 kg    Examination:  General exam: Appears calm and comfortable, scalp hematoma Respiratory system: Clear to auscultation. Respiratory effort normal. Cardiovascular system: S1 & S2 heard, RRR. No JVD, murmurs, rubs, gallops or clicks. No pedal edema. Gastrointestinal system: Abdomen is nondistended, soft and nontender. No organomegaly or masses felt. Normal bowel sounds heard. Central nervous system: Alert and oriented. No focal neurological deficits. Extremities: Symmetric 5 x 5 power. Skin: No rashes, lesions or ulcers Psychiatry: Judgement and insight appear poor, mood & affect appropriate.    Data Reviewed: I have personally reviewed following labs and imaging studies  CBC: Recent Labs  Lab 04/15/19 1310 04/15/19 1316 04/16/19 0521 04/17/19 0436  WBC 4.5  --  4.8 4.3  NEUTROABS 2.9  --   --   --   HGB 13.0 13.9 11.9* 11.4*  HCT 41.8 41.0 36.8 35.7*  MCV 97.9  --  94.6 94.9  PLT 87*  --  75* 71*   Basic Metabolic Panel: Recent Labs  Lab 04/15/19 1310 04/15/19 1316 04/16/19 1039 04/17/19 0436  NA 142 139 142 143  K 3.8 5.3* 4.2 4.0  CL 105 105 108 109  CO2 28  --  30 27  GLUCOSE 96 89 108* 86  BUN 10 14 13 16   CREATININE 0.98 0.80 0.91 0.98  CALCIUM 10.3  --  10.0 9.5   GFR: Estimated Creatinine Clearance: 39.3 mL/min (by C-G formula based on SCr of 0.98 mg/dL). Liver Function Tests: Recent Labs  Lab 04/15/19 1310  AST 28  ALT 16  ALKPHOS 49  BILITOT 1.3*  PROT 5.4*  ALBUMIN 3.5   No results for input(s): LIPASE, AMYLASE in the last 168 hours. No results for input(s): AMMONIA in the last 168 hours. Coagulation Profile: No results for input(s): INR,  PROTIME in the last 168 hours. Cardiac Enzymes: No results for input(s): CKTOTAL, CKMB, CKMBINDEX, TROPONINI in the last 168 hours. BNP (last 3 results) No results for input(s): PROBNP in the last 8760 hours. HbA1C: No results for input(s): HGBA1C in the last 72 hours. CBG: Recent Labs  Lab 04/16/19 0340  GLUCAP 85   Lipid Profile: No results for input(s): CHOL, HDL, LDLCALC, TRIG, CHOLHDL, LDLDIRECT in the last 72 hours. Thyroid Function Tests: No results for input(s): TSH, T4TOTAL, FREET4, T3FREE, THYROIDAB in the last 72 hours. Anemia Panel: No results for input(s): VITAMINB12, FOLATE, FERRITIN, TIBC, IRON, RETICCTPCT in the last 72 hours. Sepsis Labs: No results for input(s): PROCALCITON, LATICACIDVEN in the last 168 hours.  Recent Results (from the past 240 hour(s))  SARS CORONAVIRUS 2 (TAT 6-24 HRS) Nasopharyngeal Nasopharyngeal Swab     Status: None  Collection Time: 04/15/19  4:54 PM   Specimen: Nasopharyngeal Swab  Result Value Ref Range Status   SARS Coronavirus 2 NEGATIVE NEGATIVE Final    Comment: (NOTE) SARS-CoV-2 target nucleic acids are NOT DETECTED. The SARS-CoV-2 RNA is generally detectable in upper and lower respiratory specimens during the acute phase of infection. Negative results do not preclude SARS-CoV-2 infection, do not rule out co-infections with other pathogens, and should not be used as the sole basis for treatment or other patient management decisions. Negative results must be combined with clinical observations, patient history, and epidemiological information. The expected result is Negative. Fact Sheet for Patients: SugarRoll.be Fact Sheet for Healthcare Providers: https://www.woods-mathews.com/ This test is not yet approved or cleared by the [place] FDA and  has been authorized for detection and/or diagnosis of SARS-CoV-2 by FDA under an Emergency Use Authorization (EUA). This EUA will remain    in effect (meaning this test can be used) for the duration of the COVID-19 declaration under Section 56 4(b)(1) of the Act, 21 U.S.C. section 360bbb-3(b)(1), unless the authorization is terminated or revoked sooner. Performed at [facility], [place] [address] [place], [place] [address]   Culture, Urine     Status: Abnormal (Preliminary result)   Collection Time: 04/16/19  9:22 AM   Specimen: Urine, Random  Result Value Ref Range Status   Specimen Description URINE, RANDOM  Final   Special Requests NONE  Final   Culture (A)  Final    >=100,000 COLONIES/mL ESCHERICHIA COLI SUSCEPTIBILITIES TO FOLLOW Performed at [facility], [place] [address], [place], [place] [address]    Report Status PENDING  Incomplete      Radiology Studies: DG Knee 2 Views Right  Result Date: 04/15/2019 CLINICAL DATA:  Trauma secondary to a fall. EXAM: RIGHT KNEE - 1-2 VIEW COMPARISON:  None. FINDINGS: There is no fracture or dislocation or joint effusion. 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 Chondrocalcinosis. IMPRESSION: No acute abnormality. Arthritic changes as described. Electronically Signed   By: Lorriane Shire M.D.   On: 04/15/2019 13:23   CT Head Wo Contrast  Result Date: 04/15/2019 CLINICAL DATA:  Posttraumatic headache and neck pain after trauma. EXAM: CT HEAD WITHOUT CONTRAST CT CERVICAL SPINE WITHOUT CONTRAST TECHNIQUE: Multidetector CT imaging of the head and cervical spine was performed following the standard protocol without intravenous contrast. Multiplanar CT image reconstructions of the cervical spine were also generated. COMPARISON:  February 11, 2018. November 02, 2018. FINDINGS: CT HEAD FINDINGS Brain: Mild diffuse cortical atrophy is noted. Mild chronic ischemic white matter disease is noted. No mass effect or midline shift is noted. Ventricular size is within normal limits. There is no evidence of mass lesion, hemorrhage or acute infarction.  Vascular: No hyperdense vessel or unexpected calcification. Skull: Normal. Negative for fracture or focal lesion. Sinuses/Orbits: No acute finding. Other: Mild right posterior parietal scalp hematoma is noted. CT CERVICAL SPINE FINDINGS Alignment: Mild grade 1 retrolisthesis of C5-6 is noted secondary to degenerative disc disease at this level. Skull base and vertebrae: No acute fracture. No primary bone lesion or focal pathologic process. Soft tissues and spinal canal: No prevertebral fluid or swelling. No visible canal hematoma. Disc levels: Severe degenerative disc disease is noted at C5-6 with anterior posterior osteophyte formation. Upper chest: Negative. Other: None. IMPRESSION: 1. Mild right posterior parietal scalp hematoma. Mild diffuse cortical atrophy. Mild chronic ischemic white matter disease. No acute intracranial abnormality seen. 2. Multilevel degenerative disc disease. No acute abnormality  seen in the cervical spine. Electronically Signed   By: Marijo Conception M.D.   On: 04/15/2019 14:28   CT Chest W Contrast  Result Date: 04/15/2019 CLINICAL DATA:  Abdominal trauma. Ground level fall on blood thinners. EXAM: CT CHEST, ABDOMEN, AND PELVIS WITH CONTRAST TECHNIQUE: Multidetector CT imaging of the chest, abdomen and pelvis was performed following the standard protocol during bolus administration of intravenous contrast. CONTRAST:  140mL OMNIPAQUE IOHEXOL 300 MG/ML  SOLN COMPARISON:  None available FINDINGS: CT CHEST FINDINGS Cardiovascular: No significant vascular findings. Normal heart size. No pericardial effusion. Heart is enlarged. Mediastinum/Nodes: No axillary or supraclavicular adenopathy. No mediastinal or hilar adenopathy esophagus normal. Trachea normal Lungs/Pleura: No pneumothorax. Small bilateral pleural effusions greater on the RIGHT. Airways normal. Musculoskeletal: No rib fracture. No scapular fracture CT ABDOMEN AND PELVIS FINDINGS Hepatobiliary: Benign cyst in the RIGHT hepatic  lobe. Low-density lesion LEFT hepatic lobe is is favored benign cysts all the not fully characterize. No hepatic laceration. Pancreas: Pancreas is normal. No ductal dilatation. No pancreatic inflammation. Spleen: Heterogeneous perfusion of the spleen. No splenic laceration. Adrenals/urinary tract: Adrenal glands normal. No evidence of renal cortical injury. Partially exophytic round lesion extending from the RIGHT kidney is high-density on noncontrast and postcontrast imaging most consistent with benign hemorrhagic cyst. Simple fluid attenuation cyst extending from the lower pole of the RIGHT kidney. Bladder is intact Stomach/Bowel: Stomach, small bowel, appendix, and cecum are normal. The colon and rectosigmoid colon are normal. The descending colon does enter a LEFT inguinal hernia sac. The bowel is nonobstructed. The sac is relatively large measuring 7.8 x 2.0 cm (image 98/8). Distal rectosigmoid colon normal. There are several diverticula sigmoid colon. Vascular/Lymphatic: Abdominal aorta is normal caliber. There is no retroperitoneal or periportal lymphadenopathy. No pelvic lymphadenopathy. Reproductive: Post hysterectomy Other: No free fluid. Musculoskeletal: No pelvic fracture spine fracture IMPRESSION: Chest. 1. No evidence of thoracic trauma. 2. Small bilateral pleural effusions. 3. Cardiomegaly. Abdomen / Pelvis. 1. No evidence of solid organ injury in the abdomen pelvis. 2. Large LEFT inguinal hernia sac contains nonobstructed descending colon. 3. Mixed density renal cysts are favored benign hemorrhagic and nonhemorrhagic cysts. 4. Heterogeneous perfusion of the spleen is favored benign. No evidence of splenic trauma. Electronically Signed   By: Suzy Bouchard M.D.   On: 04/15/2019 14:38   CT Cervical Spine Wo Contrast  Result Date: 04/15/2019 CLINICAL DATA:  Posttraumatic headache and neck pain after trauma. EXAM: CT HEAD WITHOUT CONTRAST CT CERVICAL SPINE WITHOUT CONTRAST TECHNIQUE: Multidetector  CT imaging of the head and cervical spine was performed following the standard protocol without intravenous contrast. Multiplanar CT image reconstructions of the cervical spine were also generated. COMPARISON:  February 11, 2018. November 02, 2018. FINDINGS: CT HEAD FINDINGS Brain: Mild diffuse cortical atrophy is noted. Mild chronic ischemic white matter disease is noted. No mass effect or midline shift is noted. Ventricular size is within normal limits. There is no evidence of mass lesion, hemorrhage or acute infarction. Vascular: No hyperdense vessel or unexpected calcification. Skull: Normal. Negative for fracture or focal lesion. Sinuses/Orbits: No acute finding. Other: Mild right posterior parietal scalp hematoma is noted. CT CERVICAL SPINE FINDINGS Alignment: Mild grade 1 retrolisthesis of C5-6 is noted secondary to degenerative disc disease at this level. Skull base and vertebrae: No acute fracture. No primary bone lesion or focal pathologic process. Soft tissues and spinal canal: No prevertebral fluid or swelling. No visible canal hematoma. Disc levels: Severe degenerative disc disease is noted at C5-6 with  anterior posterior osteophyte formation. Upper chest: Negative. Other: None. IMPRESSION: 1. Mild right posterior parietal scalp hematoma. Mild diffuse cortical atrophy. Mild chronic ischemic white matter disease. No acute intracranial abnormality seen. 2. Multilevel degenerative disc disease. No acute abnormality seen in the cervical spine. Electronically Signed   By: Marijo Conception M.D.   On: 04/15/2019 14:28   CT Abdomen Pelvis W Contrast  Result Date: 04/15/2019 CLINICAL DATA:  Abdominal trauma. Ground level fall on blood thinners. EXAM: CT CHEST, ABDOMEN, AND PELVIS WITH CONTRAST TECHNIQUE: Multidetector CT imaging of the chest, abdomen and pelvis was performed following the standard protocol during bolus administration of intravenous contrast. CONTRAST:  168mL OMNIPAQUE IOHEXOL 300 MG/ML  SOLN  COMPARISON:  None available FINDINGS: CT CHEST FINDINGS Cardiovascular: No significant vascular findings. Normal heart size. No pericardial effusion. Heart is enlarged. Mediastinum/Nodes: No axillary or supraclavicular adenopathy. No mediastinal or hilar adenopathy esophagus normal. Trachea normal Lungs/Pleura: No pneumothorax. Small bilateral pleural effusions greater on the RIGHT. Airways normal. Musculoskeletal: No rib fracture. No scapular fracture CT ABDOMEN AND PELVIS FINDINGS Hepatobiliary: Benign cyst in the RIGHT hepatic lobe. Low-density lesion LEFT hepatic lobe is is favored benign cysts all the not fully characterize. No hepatic laceration. Pancreas: Pancreas is normal. No ductal dilatation. No pancreatic inflammation. Spleen: Heterogeneous perfusion of the spleen. No splenic laceration. Adrenals/urinary tract: Adrenal glands normal. No evidence of renal cortical injury. Partially exophytic round lesion extending from the RIGHT kidney is high-density on noncontrast and postcontrast imaging most consistent with benign hemorrhagic cyst. Simple fluid attenuation cyst extending from the lower pole of the RIGHT kidney. Bladder is intact Stomach/Bowel: Stomach, small bowel, appendix, and cecum are normal. The colon and rectosigmoid colon are normal. The descending colon does enter a LEFT inguinal hernia sac. The bowel is nonobstructed. The sac is relatively large measuring 7.8 x 2.0 cm (image 98/8). Distal rectosigmoid colon normal. There are several diverticula sigmoid colon. Vascular/Lymphatic: Abdominal aorta is normal caliber. There is no retroperitoneal or periportal lymphadenopathy. No pelvic lymphadenopathy. Reproductive: Post hysterectomy Other: No free fluid. Musculoskeletal: No pelvic fracture spine fracture IMPRESSION: Chest. 1. No evidence of thoracic trauma. 2. Small bilateral pleural effusions. 3. Cardiomegaly. Abdomen / Pelvis. 1. No evidence of solid organ injury in the abdomen pelvis. 2. Large  LEFT inguinal hernia sac contains nonobstructed descending colon. 3. Mixed density renal cysts are favored benign hemorrhagic and nonhemorrhagic cysts. 4. Heterogeneous perfusion of the spleen is favored benign. No evidence of splenic trauma. Electronically Signed   By: Suzy Bouchard M.D.   On: 04/15/2019 14:38   ECHOCARDIOGRAM COMPLETE  Result Date: 04/16/2019    ECHOCARDIOGRAM REPORT   Patient Name:   MOO HOLZMANN Date of Exam: 04/16/2019 Medical Rec #:  [id number]         Height:       67.0 in Accession #:    [id number]        Weight:       140.0 lb Date of Birth:  08/21/1929         BSA:          1.738 m Patient Age:    63 years          BP:           128/68 mmHg Patient Gender: F                 HR:           68 bpm. Exam Location:  Inpatient  Procedure: 2D Echo, Color Doppler and Cardiac Doppler Indications:    R01.1 Murmur  History:        Patient has prior history of Echocardiogram examinations, most                 recent 09/05/2018. CHF, Pacemaker, Arrythmias:Atrial                 Fibrillation; Risk Factors:Hypertension and Dyslipidemia. 42mm                 Biocor Mitral Valve Replacement in 2011.                  Mitral Valve: 29 mm Biocor bioprosthetic valve valve is present                 in the mitral position. Procedure Date: 2011.  Sonographer:    Raquel Sarna Senior RDCS Referring Phys: [id number] Lequita Halt  Sonographer Comments: Technically difficult apical window due to small rib spacing. IMPRESSIONS  1. Left ventricular ejection fraction, by estimation, is 50 to 55%. The left ventricle has low normal function. The left ventricle has no regional wall motion abnormalities. Left ventricular diastolic function could not be evaluated. Left ventricular diastolic function could not be evaluated.  2. Right ventricular systolic function is normal. The right ventricular size is mildly enlarged. There is moderately elevated pulmonary artery systolic pressure.  3. Left atrial size was severely dilated.  4.  Right atrial size was severely dilated.  5. The mitral valve has been repaired/replaced. No evidence of mitral valve regurgitation. Mild mitral stenosis. The mean mitral valve gradient is 7.0 mmHg with average heart rate of 70 bpm. There is a 29 mm Biocor bioprosthetic valve present in the mitral position. Procedure Date: 2011. Echo findings are consistent with normal structure and function of the mitral valve prosthesis.  6. Tricuspid valve regurgitation is moderate.  7. The aortic valve is tricuspid. Aortic valve regurgitation is mild. Mild aortic valve sclerosis is present, with no evidence of aortic valve stenosis.  8. There is borderline dilatation of the ascending aorta measuring 37 mm.  9. The inferior vena cava is dilated in size with >50% respiratory variability, suggesting right atrial pressure of 8 mmHg. Comparison(s): Changes from prior study are noted. Left ventricular systolic function is lower, but there are no regional wall motion abnormalities. There is no change in mitral prosthesis gradients, allowing for changes in heart rate. FINDINGS  Left Ventricle: Left ventricular ejection fraction, by estimation, is 50 to 55%. The left ventricle has low normal function. The left ventricle has no regional wall motion abnormalities. The left ventricular internal cavity size was normal in size. There is no left ventricular hypertrophy. Abnormal (paradoxical) septal motion consistent with post-operative status. Left ventricular diastolic function could not be evaluated due to mitral valve replacement. Left ventricular diastolic function could not be evaluated. Right Ventricle: The right ventricular size is mildly enlarged. No increase in right ventricular wall thickness. Right ventricular systolic function is normal. There is moderately elevated pulmonary artery systolic pressure. The tricuspid regurgitant velocity is 3.24 m/s, and with an assumed right atrial pressure of 8 mmHg, the estimated right ventricular  systolic pressure is [id number] mmHg. Left Atrium: Left atrial size was severely dilated. Right Atrium: Right atrial size was severely dilated. Pericardium: There is no evidence of pericardial effusion. Mitral Valve: The mitral valve has been repaired/replaced. No evidence of mitral valve regurgitation. There is a 29 mm Biocor bioprosthetic valve present in  the mitral position. Procedure Date: 2011. Echo findings are consistent with normal structure and  function of the mitral valve prosthesis. Mild mitral valve stenosis. MV peak gradient, 18.0 mmHg. The mean mitral valve gradient is 7.0 mmHg with average heart rate of 70 bpm. Tricuspid Valve: The tricuspid valve is normal in structure. Tricuspid valve regurgitation is moderate. Aortic Valve: The aortic valve is tricuspid. Aortic valve regurgitation is mild. Mild aortic valve sclerosis is present, with no evidence of aortic valve stenosis. Pulmonic Valve: The pulmonic valve was normal in structure. Pulmonic valve regurgitation is mild to moderate. Aorta: The aortic root is normal in size and structure. There is borderline dilatation of the ascending aorta measuring 37 mm. Venous: The inferior vena cava is dilated in size with greater than 50% respiratory variability, suggesting right atrial pressure of 8 mmHg. IAS/Shunts: No atrial level shunt detected by color flow Doppler. Additional Comments: A pacer wire is visualized.  LEFT VENTRICLE PLAX 2D LVIDd:         4.90 cm LVIDs:         3.60 cm LV PW:         1.20 cm LV IVS:        0.80 cm LVOT diam:     1.90 cm LV SV:         46 LV SV Index:   26 LVOT Area:     2.84 cm  RIGHT VENTRICLE RV S prime:     6.66 cm/s TAPSE (M-mode): 1.2 cm LEFT ATRIUM              Index        RIGHT ATRIUM           Index LA diam:        5.50 cm  3.16 cm/m   RA Area:     36.50 cm LA Vol (A2C):   158.0 ml 90.92 ml/m  RA Volume:   147.00 ml 84.59 ml/m LA Vol (A4C):   179.0 ml 103.00 ml/m LA Biplane Vol: 179.0 ml 103.00 ml/m  AORTIC VALVE  LVOT Vmax:   88.10 cm/s LVOT Vmean:  56.000 cm/s LVOT VTI:    0.162 m  AORTA Ao Root diam: 3.10 cm Ao Asc diam:  3.70 cm MITRAL VALVE              TRICUSPID VALVE MV Area (PHT): 1.53 cm   TR Peak grad:   42.0 mmHg MV Peak grad:  18.0 mmHg  TR Vmax:        324.00 cm/s MV Mean grad:  7.0 mmHg MV Vmax:       2.12 m/s   SHUNTS MV Vmean:      120.0 cm/s Systemic VTI:  0.16 m                           Systemic Diam: 1.90 cm Dani Gobble Croitoru MD Electronically signed by Sanda Klein MD Signature Date/Time: 04/16/2019/5:02:52 PM    Final     Scheduled Meds: . acidophilus  1 capsule Oral Daily  . vitamin C  500 mg Oral Daily  . B-complex with vitamin C  1 tablet Oral Daily  . cholecalciferol  2,000 Units Oral Daily  . diclofenac Sodium  2 g Topical QID  . donepezil  5 mg Oral QHS  . irbesartan  150 mg Oral Daily  . lidocaine  1 patch Transdermal Q24H  . pantoprazole  40 mg Oral Daily  .  simvastatin  10 mg Oral Daily   Continuous Infusions: . sodium chloride 50 mL/hr at 04/17/19 1038  . cefTRIAXone (ROCEPHIN)  IV 1 g (04/17/19 1042)     LOS: 0 days   Time spent: 35 minutes   Darliss Cheney, MD [facility]  04/17/2019, 11:32 AM   To contact the attending provider between 7A-7P or the covering provider during after hours 7P-7A, please log into the web site [email or web address].

## 2019-04-17 NOTE — Progress Notes (Signed)
Physical Therapy Treatment Patient Details Name: [name] MRN: [id number] DOB: 11-15-1929 Today's Date: 04/17/2019    History of Present Illness 86yo female s/p fall at home when not using RW. All imaging negative for fracture or acute injury. PMH CVA 08/2018, A-fib, L atrial thrombus on Eliquis    PT Comments    Patient received in bed, pleasant and motivated to work with therapy today. See below for mobility/assist levels. Mobility overall much improved and able to progress gait distance significantly but does have heavy reliance on BUE support on RW during dynamic activities, also needed cues for navigation and safety with mobility. Continues to have some STM and safety deficits.  Per OT notes/eval, family reports she actually does not have 24/7 S and are concerned about overnight. With this in mind, would certainly recommend SNF if 24/7 S is not available at home, notes updated accordingly. She was left up in the chair with all needs met, chair alarm active this morning.     Follow Up Recommendations  Home health PT;Supervision/Assistance - 24 hour;Other (comment)(SNF if 24/7 S not available)     Equipment Recommendations  Rolling walker with 5" wheels;3in1 (PT)    Recommendations for Other Services       Precautions / Restrictions Precautions Precautions: Fall Restrictions Weight Bearing Restrictions: No    Mobility  Bed Mobility Overal bed mobility: Needs Assistance Bed Mobility: Supine to Sit     Supine to sit: HOB elevated;Supervision     General bed mobility comments: S for safety, assist for line management  Transfers Overall transfer level: Needs assistance Equipment used: Rolling walker (2 wheeled) Transfers: Sit to/from Stand Sit to Stand: Min assist         General transfer comment: MinA to boost to full standing from low bed, once up able to maintain balance with min guard  Ambulation/Gait Ambulation/Gait assistance: Min guard Gait Distance  (Feet): 100 Feet Assistive device: Rolling walker (2 wheeled) Gait Pattern/deviations: Step-through pattern;Decreased step length - right;Decreased step length - left;Decreased stride length;Decreased dorsiflexion - left;Decreased dorsiflexion - right;Trunk flexed;Narrow base of support Gait velocity: decreased   General Gait Details: slow and steady with RW, min guard for safety   Stairs             Wheelchair Mobility    Modified Rankin (Stroke Patients Only)       Balance Overall balance assessment: Needs assistance;History of Falls Sitting-balance support: No upper extremity supported;Feet supported Sitting balance-Leahy Scale: Good Sitting balance - Comments: S for safety statically   Standing balance support: Bilateral upper extremity supported;During functional activity Standing balance-Leahy Scale: Poor Standing balance comment: relaint on BUE support dynaimcally, min assist statically and 0 hand support (washing hands) with mild posterior lean                            Cognition Arousal/Alertness: Awake/alert Behavior During Therapy: WFL for tasks assessed/performed Overall Cognitive Status: Impaired/Different from baseline(based on past eval with daughter present) Area of Impairment: Safety/judgement;Awareness;Problem solving                     Memory: Decreased short-term memory   Safety/Judgement: Decreased awareness of safety;Decreased awareness of deficits Awareness: Emergent Problem Solving: Slow processing;Difficulty sequencing;Requires verbal cues General Comments: able to follow all one step commands; cues for safety      Exercises      General Comments General comments (skin integrity, edema, etc.):  VSS      Pertinent Vitals/Pain Pain Assessment: Faces Faces Pain Scale: Hurts a little bit Pain Location: R knee Pain Descriptors / Indicators: Aching;Sore;Discomfort Pain Intervention(s): Limited activity within  patient's tolerance;Monitored during session;Repositioned    Home Living                      Prior Function            PT Goals (current goals can now be found in the care plan section) Acute Rehab PT Goals Patient Stated Goal: go home PT Goal Formulation: With patient Time For Goal Achievement: 04/30/19 Potential to Achieve Goals: Good Progress towards PT goals: Progressing toward goals    Frequency    Min 3X/week      PT Plan Discharge plan needs to be updated;Equipment recommendations need to be updated    Co-evaluation              AM-PAC PT "6 Clicks" Mobility   Outcome Measure  Help needed turning from your back to your side while in a flat bed without using bedrails?: None Help needed moving from lying on your back to sitting on the side of a flat bed without using bedrails?: A Little Help needed moving to and from a bed to a chair (including a wheelchair)?: A Little Help needed standing up from a chair using your arms (e.g., wheelchair or bedside chair)?: A Little Help needed to walk in hospital room?: A Little Help needed climbing 3-5 steps with a railing? : A Little 6 Click Score: 19    End of Session Equipment Utilized During Treatment: Gait belt Activity Tolerance: Patient tolerated treatment well Patient left: in chair;with call bell/phone within reach;with chair alarm set   PT Visit Diagnosis: Unsteadiness on feet (R26.81);Muscle weakness (generalized) (M62.81);History of falling (Z91.81);Difficulty in walking, not elsewhere classified (R26.2)     Time: 7824-2353 PT Time Calculation (min) (ACUTE ONLY): 19 min  Charges:  $Gait Training: 8-22 mins                     Windell Norfolk, DPT, PN1   Supplemental Physical Therapist [place]    Pager [phone] Acute Rehab Office [phone]

## 2019-04-17 NOTE — Progress Notes (Signed)
ANTICOAGULATION CONSULT NOTE - Initial Consult  Pharmacy Consult for Enoxaparin Indication: VTE prophylaxis  No Known Allergies  Patient Measurements: Height: 5\' 7"  (170.2 cm) Weight: 140 lb (63.5 kg) IBW/kg (Calculated) : 61.6   Vital Signs: Temp: 97.6 F (36.4 C) (03/03 0800) Temp Source: Axillary (03/03 0800) BP: 130/76 (03/03 0800) Pulse Rate: 74 (03/03 0800)  Labs: Recent Labs    04/15/19 1310 04/15/19 1310 04/15/19 1316 04/15/19 1316 04/15/19 1526 04/16/19 0521 04/16/19 1039 04/17/19 0436  HGB 13.0   < > 13.9   < >  --  11.9*  --  11.4*  HCT 41.8   < > 41.0  --   --  36.8  --  35.7*  PLT 87*  --   --   --   --  75*  --  71*  CREATININE 0.98   < > 0.80  --   --   --  0.91 0.98  TROPONINIHS 25*  --   --   --  27*  --   --   --    < > = values in this interval not displayed.    Estimated Creatinine Clearance: 39.3 mL/min (by C-G formula based on SCr of 0.98 mg/dL).    Assessment: 86 yo female with h/o afib and atrial thrombus on apixaban PTA. Admitted  Due to a fall at home. Apixaban on hold for dropping hemoglobin. MD would like to start patient on enoxaparin for VTE prophylaxis while apixaban being held. CrCl ~40, BMI 22.   Goal of Therapy:  Prevention of VTE Monitor platelets by anticoagulation protocol: Yes   Plan:  Enoxaparin 40mg  SQ daily Monitor hemoglobin F/u with apixaban restart  Trissa Molina A. Levada Dy, PharmD, BCPS, FNKF Clinical Pharmacist [place] Please utilize Amion for appropriate phone number to reach the unit pharmacist ([place])   04/17/2019,11:57 AM

## 2019-04-18 ENCOUNTER — Telehealth: Payer: Self-pay | Admitting: *Deleted

## 2019-04-18 ENCOUNTER — Telehealth: Payer: Self-pay | Admitting: Cardiovascular Disease

## 2019-04-18 LAB — BASIC METABOLIC PANEL
Anion gap: 9 (ref 5–15)
BUN: 17 mg/dL (ref 8–23)
CO2: 25 mmol/L (ref 22–32)
Calcium: 9.6 mg/dL (ref 8.9–10.3)
Chloride: 110 mmol/L (ref 98–111)
Creatinine, Ser: 0.84 mg/dL (ref 0.44–1.00)
GFR calc Af Amer: >60 mL/min (ref >60)
GFR calc non Af Amer: >60 mL/min (ref >60)
Glucose, Bld: 86 mg/dL (ref 70–99)
Potassium: 4.1 mmol/L (ref 3.5–5.1)
Sodium: 144 mmol/L (ref 135–145)

## 2019-04-18 LAB — CBC
HCT: 37.9 % (ref 36.0–46.0)
Hemoglobin: 11.9 g/dL — ABNORMAL LOW (ref 12.0–15.0)
MCH: 30.5 pg (ref 26.0–34.0)
MCHC: 31.4 g/dL (ref 30.0–36.0)
MCV: 97.2 fL (ref 80.0–100.0)
Platelets: 66 10*3/uL — ABNORMAL LOW (ref 150–400)
RBC: 3.9 MIL/uL (ref 3.87–5.11)
RDW: 15.2 % (ref 11.5–15.5)
WBC: 4.5 10*3/uL (ref 4.0–10.5)
nRBC: 0 % (ref 0.0–0.2)

## 2019-04-18 LAB — URINE CULTURE: Culture: >=100000 — AB

## 2019-04-18 LAB — OCCULT BLOOD X 1 CARD TO LAB, STOOL: Fecal Occult Bld: NEGATIVE

## 2019-04-18 NOTE — TOC Initial Note (Addendum)
Transition of Care [facility]) - Initial/Assessment Note    Patient Details  Name: [name] MRN: [id number] Date of Birth: Aug 02, 1929  Transition of Care [facility]) CM/SW Contact:    Christina Favre, RN Phone Number: 04/18/2019, 8:54 AM  Clinical Narrative:                 See prior TOC note. Spoke with [name] with [facility] regarding referral and requesting staff. Unfortunately [facility] do not have OT or SP at this time , so [name] is unable to accept referral.   Left message for patient son [name].   Spoke with patient's daughter [name] explained above.   She will discuss with her brother [name] and have him call NCM back.   [name] stated they used [name] for their father and do not want to use them again.   Patient has bedside commode, walker and shower chair at home.   Will need home health orders   Spoke with son [name]. He and [name] have discussed disposition. Both are aware [facility] cannot provide OT or SP. They do not want another agency. Son wants [facility] with same PT his mother had before. Discussed with [name] with [facility] she is checking to see if she can accept and provide same PT.  [name] with [facility] can provide HHPT and OT with same staff.   [name] aware and agreeable.   Expected Discharge Plan: [place] Barriers to Discharge: Continued Medical Work up   Patient Goals and CMS Choice Patient states their goals for this hospitalization and ongoing recovery are:: to go home CMS Medicare.gov Compare Post Acute Care list provided to:: Patient Choice offered to / list presented to : Adult Children  Expected Discharge Plan and Services Expected Discharge Plan: [place] In-house Referral: Clinical Social Work Discharge Planning Services: CM Consult Post Acute Care Choice: [place] arrangements for the past 2 months: Single Family Home                 DME Arranged: N/A DME Agency: Other -  Comment([facility] [employer] for aide services and [facility] for PT/OT.RN,Speech)       HH Arranged: NA          Prior Living Arrangements/Services Living arrangements for the past 2 months: [place] with:: Adult Children Patient language and need for interpreter reviewed:: Yes Do you feel safe going back to the place where you live?: Yes(Family comfortable with patient being home with 24/7 aide services through [facility])      Need for Family Participation in Patient Care: Yes (Comment) Care giver support system in place?: Yes (comment) Current home services: DME Criminal Activity/Legal Involvement Pertinent to Current Situation/Hospitalization: No - Comment as needed  Activities of Daily Living      Permission Sought/Granted Permission sought to share information with : Family Supports(Spoke directly with daughter and son) Permission granted to share information with : No(Daughter requested that CSW contact her)  Share Information with NAME: daughter [name] and son [name]           Emotional Assessment Appearance:: Appears stated age Attitude/Demeanor/Rapport: Unable to Assess Affect (typically observed): Unable to Assess Orientation: : Oriented to Self, Oriented to Place, Oriented to  Time, Oriented to Situation Alcohol / Substance Use: Tobacco Use, Alcohol Use, Illicit Drugs(Information re: tobacco, alcohol and illicit drug use not on file) Psych Involvement: No (comment)  Admission diagnosis:  Fall [W19.XXXA] Hx of long term use of blood thinners [Z92.29] Injury of head, initial encounter [S09.90XA] Fall, initial encounter [W19.XXXA] Ambulatory dysfunction [R26.2] Hematoma of right knee region [S80.01XA] Patient Active Problem List   Diagnosis Date Noted  . Fall at home, initial encounter 04/15/2019  . Fall 04/15/2019  . Head injury    PCP:  Lawerance Cruel, MD Pharmacy:   [id number] [address], [place] [address] Phone: [phone] Fax: [phone]     Social Determinants of Health (SDOH) Interventions    Readmission Risk Interventions No flowsheet data found.

## 2019-04-18 NOTE — Telephone Encounter (Signed)
Spoke with pt son, aware of dr croitoru's recommendations.

## 2019-04-18 NOTE — Telephone Encounter (Signed)
New Message    Pt c/o medication issue:  1. Name of Medication: ELIQUIS 5 MG TABS tablet  2. How are you currently taking this medication (dosage and times per day)? 5 mg 2 x daily   3. Are you having a reaction (difficulty breathing--STAT)? No   4. What is your medication issue? Pts son is calling and says the Hospital Dr wants her to stop taking the Eliquis  Pts son is calling to see if this is okay

## 2019-04-18 NOTE — Discharge Summary (Signed)
Physician Discharge Summary  [name] [id number] DOB: 1929-06-01 DOA: 04/15/2019  PCP: Christina Cruel, MD  Admit date: 04/15/2019 Discharge date: 04/18/2019  Admitted From: Home Disposition: Home  Recommendations for Outpatient Follow-up:  1. Follow up with PCP in 1-2 weeks 2. Please obtain BMP/CBC in one week 3. Please follow up on the following pending results:  Home Health: Yes Equipment/Devices:   Discharge Condition: Stable CODE STATUS: DNR Diet recommendation: Cardiac  Subjective: Patient seen and examined.  No complaints.  Eager to go home.  Brief/Interim Summary: [name] an 86 y.o.femalewith medical history significant ofrecent embolic stroke in July [id number], A. fib, with left atrium thrombosis, chronic ambulatory dysfunction, baseline using walker to ambulate, presented with episode of fall.Patient and her son both reported that patient forgot to use her walker this morning, and fell down in the bathroom. Denied any lightheadedness, no blurred vision no chest pain, no short of breath, denied any weakness or numbness of any of the limbs. Son was not able to pull her up and then called 911.She remembered properly hit her head against the wall, then developedheadache and pain on the side of her face,also complaining aboutright shoulderandright kneepain.She denied any loss of consciousness.  The son reported that the patient was hospitalized July 2020 for episode of new onset A. fib, and and later pacemaker was placed. But then patient developed urosepsis and then bacteremia. Then also had a TEEshowing2 small clots in her heart, and eventually patient was started on Eliquis for that.  CT head found scalp hematoma but no intracranial bleeding.  CT abdomen and pelvis was negative for any bleeding or any other pathology.  CT cervical spine showed multilevel degenerative disc disease but no other acute pathology or fracture.  CT chest also did  not show any acute pathology.  Due to hematoma, patient's Eliquis was held.  CBC was followed, initially her hemoglobin dropped from 13-11.5 but then it remained stable.  She also has a history of chronic thrombocytopenia.  She was also diagnosed with UTI for which she received 3 doses of Rocephin with the last dose being today.  Electrolyte abnormalities were fixed.  She was seen by PT OT.  They recommended discharging either home with home health and 24-hour supervision or skilled nursing facility.  Family has obtained 24-hour care at home and they have agreed to take the patient home.  She is a stable so she is being discharged home.  Of note, although patient is doing fine and hematoma seems to be stable but her platelets are dropping, she came in with 87 and then dropped to 66.  I had a long discussion with the daughter and the son.  Son was making the decision.  I recommended holding Eliquis for at least few days and repeating CBC at PCPs office next week and based on the platelets, her Eliquis could be resumed however the son was not comfortable with this decision unless he talks to patient's cardiologist due to her atrial thrombosis, recent stroke and history of atrial fibrillation.  He asked [place] to resume her Eliquis and then he will decide at home what he is going to do with that.  Discharge Diagnoses:  Active Problems:   Fall at home, initial encounter   Fall   Head injury    Discharge Instructions  Discharge Instructions    Discharge patient   Complete by: As directed    Discharge disposition: 06-Home-Health Care Svc   Discharge patient date:  04/18/2019     Allergies as of 04/18/2019   No Known Allergies     Medication List    TAKE these medications   ALPRAZolam 0.5 MG tablet Commonly known as: XANAX Take 0.5 mg by mouth daily as needed for anxiety or sleep.   b complex vitamins capsule Take 1 capsule by mouth daily.   donepezil 5 MG tablet Commonly known as: ARICEPT Take 5  mg by mouth at bedtime.   Eliquis 5 MG Tabs tablet Generic drug: apixaban Take 5 mg by mouth 2 (two) times daily.   furosemide 40 MG tablet Commonly known as: LASIX Take 40 mg by mouth daily as needed for fluid.   Immune Enhance Tabs Take 1 tablet by mouth daily.   olmesartan 20 MG tablet Commonly known as: BENICAR Take 20 mg by mouth daily.   omeprazole 20 MG capsule Commonly known as: PRILOSEC Take 20 mg by mouth daily.   potassium chloride 8 MEQ tablet Commonly known as: KLOR-CON Take 8 mEq by mouth See admin instructions. As needed if taking furosemide   PROBIOTIC-10 ULTIMATE PO Take 1 capsule by mouth daily.   simvastatin 10 MG tablet Commonly known as: ZOCOR Take 10 mg by mouth daily.   vitamin C 1000 MG tablet Take 500 mg by mouth daily.   Vitamin D 50 MCG (2000 UT) tablet Take 2,000 Units by mouth daily.      Follow-up [place], [facility] Follow up.   Contact information: [place] [address] [phone]        Christina Cruel, MD Follow up in 1 week(s).   Specialty: Family Medicine Contact information: [place] [address] [phone]          No Known Allergies  Consultations: None   Procedures/Studies: DG Knee 2 Views Right  Result Date: 04/15/2019 CLINICAL DATA:  Trauma secondary to a fall. EXAM: RIGHT KNEE - 1-2 VIEW COMPARISON:  None. FINDINGS: There is no fracture or dislocation or joint effusion. 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 Chondrocalcinosis. IMPRESSION: No acute abnormality. Arthritic changes as described. Electronically Signed   By: [name] M.D.   On: 04/15/2019 13:23   CT Head Wo Contrast  Result Date: 04/15/2019 CLINICAL DATA:  Posttraumatic headache and neck pain after trauma. EXAM: CT HEAD WITHOUT CONTRAST CT CERVICAL SPINE WITHOUT CONTRAST TECHNIQUE: Multidetector CT imaging of the head and  cervical spine was performed following the standard protocol without intravenous contrast. Multiplanar CT image reconstructions of the cervical spine were also generated. COMPARISON:  February 11, 2018. November 02, 2018. FINDINGS: CT HEAD FINDINGS Brain: Mild diffuse cortical atrophy is noted. Mild chronic ischemic white matter disease is noted. No mass effect or midline shift is noted. Ventricular size is within normal limits. There is no evidence of mass lesion, hemorrhage or acute infarction. Vascular: No hyperdense vessel or unexpected calcification. Skull: Normal. Negative for fracture or focal lesion. Sinuses/Orbits: No acute finding. Other: Mild right posterior parietal scalp hematoma is noted. CT CERVICAL SPINE FINDINGS Alignment: Mild grade 1 retrolisthesis of C5-6 is noted secondary to degenerative disc disease at this level. Skull base and vertebrae: No acute fracture. No primary bone lesion or focal pathologic process. Soft tissues and spinal canal: No prevertebral fluid or swelling. No visible canal hematoma. Disc levels: Severe degenerative disc disease is noted at C5-6 with anterior posterior osteophyte formation. Upper chest: Negative. Other: None. IMPRESSION: 1. Mild right  posterior parietal scalp hematoma. Mild diffuse cortical atrophy. Mild chronic ischemic white matter disease. No acute intracranial abnormality seen. 2. Multilevel degenerative disc disease. No acute abnormality seen in the cervical spine. Electronically Signed   By: Marijo Conception M.D.   On: 04/15/2019 14:28   CT Chest W Contrast  Result Date: 04/15/2019 CLINICAL DATA:  Abdominal trauma. Ground level fall on blood thinners. EXAM: CT CHEST, ABDOMEN, AND PELVIS WITH CONTRAST TECHNIQUE: Multidetector CT imaging of the chest, abdomen and pelvis was performed following the standard protocol during bolus administration of intravenous contrast. CONTRAST:  168mL OMNIPAQUE IOHEXOL 300 MG/ML  SOLN COMPARISON:  None available  FINDINGS: CT CHEST FINDINGS Cardiovascular: No significant vascular findings. Normal heart size. No pericardial effusion. Heart is enlarged. Mediastinum/Nodes: No axillary or supraclavicular adenopathy. No mediastinal or hilar adenopathy esophagus normal. Trachea normal Lungs/Pleura: No pneumothorax. Small bilateral pleural effusions greater on the RIGHT. Airways normal. Musculoskeletal: No rib fracture. No scapular fracture CT ABDOMEN AND PELVIS FINDINGS Hepatobiliary: Benign cyst in the RIGHT hepatic lobe. Low-density lesion LEFT hepatic lobe is is favored benign cysts all the not fully characterize. No hepatic laceration. Pancreas: Pancreas is normal. No ductal dilatation. No pancreatic inflammation. Spleen: Heterogeneous perfusion of the spleen. No splenic laceration. Adrenals/urinary tract: Adrenal glands normal. No evidence of renal cortical injury. Partially exophytic round lesion extending from the RIGHT kidney is high-density on noncontrast and postcontrast imaging most consistent with benign hemorrhagic cyst. Simple fluid attenuation cyst extending from the lower pole of the RIGHT kidney. Bladder is intact Stomach/Bowel: Stomach, small bowel, appendix, and cecum are normal. The colon and rectosigmoid colon are normal. The descending colon does enter a LEFT inguinal hernia sac. The bowel is nonobstructed. The sac is relatively large measuring 7.8 x 2.0 cm (image 98/8). Distal rectosigmoid colon normal. There are several diverticula sigmoid colon. Vascular/Lymphatic: Abdominal aorta is normal caliber. There is no retroperitoneal or periportal lymphadenopathy. No pelvic lymphadenopathy. Reproductive: Post hysterectomy Other: No free fluid. Musculoskeletal: No pelvic fracture spine fracture IMPRESSION: Chest. 1. No evidence of thoracic trauma. 2. Small bilateral pleural effusions. 3. Cardiomegaly. Abdomen / Pelvis. 1. No evidence of solid organ injury in the abdomen pelvis. 2. Large LEFT inguinal hernia sac  contains nonobstructed descending colon. 3. Mixed density renal cysts are favored benign hemorrhagic and nonhemorrhagic cysts. 4. Heterogeneous perfusion of the spleen is favored benign. No evidence of splenic trauma. Electronically Signed   By: Suzy Bouchard M.D.   On: 04/15/2019 14:38   CT Cervical Spine Wo Contrast  Result Date: 04/15/2019 CLINICAL DATA:  Posttraumatic headache and neck pain after trauma. EXAM: CT HEAD WITHOUT CONTRAST CT CERVICAL SPINE WITHOUT CONTRAST TECHNIQUE: Multidetector CT imaging of the head and cervical spine was performed following the standard protocol without intravenous contrast. Multiplanar CT image reconstructions of the cervical spine were also generated. COMPARISON:  February 11, 2018. November 02, 2018. FINDINGS: CT HEAD FINDINGS Brain: Mild diffuse cortical atrophy is noted. Mild chronic ischemic white matter disease is noted. No mass effect or midline shift is noted. Ventricular size is within normal limits. There is no evidence of mass lesion, hemorrhage or acute infarction. Vascular: No hyperdense vessel or unexpected calcification. Skull: Normal. Negative for fracture or focal lesion. Sinuses/Orbits: No acute finding. Other: Mild right posterior parietal scalp hematoma is noted. CT CERVICAL SPINE FINDINGS Alignment: Mild grade 1 retrolisthesis of C5-6 is noted secondary to degenerative disc disease at this level. Skull base and vertebrae: No acute fracture. No primary bone lesion or focal  pathologic process. Soft tissues and spinal canal: No prevertebral fluid or swelling. No visible canal hematoma. Disc levels: Severe degenerative disc disease is noted at C5-6 with anterior posterior osteophyte formation. Upper chest: Negative. Other: None. IMPRESSION: 1. Mild right posterior parietal scalp hematoma. Mild diffuse cortical atrophy. Mild chronic ischemic white matter disease. No acute intracranial abnormality seen. 2. Multilevel degenerative disc disease. No acute  abnormality seen in the cervical spine. Electronically Signed   By: Marijo Conception M.D.   On: 04/15/2019 14:28   CT Abdomen Pelvis W Contrast  Result Date: 04/15/2019 CLINICAL DATA:  Abdominal trauma. Ground level fall on blood thinners. EXAM: CT CHEST, ABDOMEN, AND PELVIS WITH CONTRAST TECHNIQUE: Multidetector CT imaging of the chest, abdomen and pelvis was performed following the standard protocol during bolus administration of intravenous contrast. CONTRAST:  154mL OMNIPAQUE IOHEXOL 300 MG/ML  SOLN COMPARISON:  None available FINDINGS: CT CHEST FINDINGS Cardiovascular: No significant vascular findings. Normal heart size. No pericardial effusion. Heart is enlarged. Mediastinum/Nodes: No axillary or supraclavicular adenopathy. No mediastinal or hilar adenopathy esophagus normal. Trachea normal Lungs/Pleura: No pneumothorax. Small bilateral pleural effusions greater on the RIGHT. Airways normal. Musculoskeletal: No rib fracture. No scapular fracture CT ABDOMEN AND PELVIS FINDINGS Hepatobiliary: Benign cyst in the RIGHT hepatic lobe. Low-density lesion LEFT hepatic lobe is is favored benign cysts all the not fully characterize. No hepatic laceration. Pancreas: Pancreas is normal. No ductal dilatation. No pancreatic inflammation. Spleen: Heterogeneous perfusion of the spleen. No splenic laceration. Adrenals/urinary tract: Adrenal glands normal. No evidence of renal cortical injury. Partially exophytic round lesion extending from the RIGHT kidney is high-density on noncontrast and postcontrast imaging most consistent with benign hemorrhagic cyst. Simple fluid attenuation cyst extending from the lower pole of the RIGHT kidney. Bladder is intact Stomach/Bowel: Stomach, small bowel, appendix, and cecum are normal. The colon and rectosigmoid colon are normal. The descending colon does enter a LEFT inguinal hernia sac. The bowel is nonobstructed. The sac is relatively large measuring 7.8 x 2.0 cm (image 98/8). Distal  rectosigmoid colon normal. There are several diverticula sigmoid colon. Vascular/Lymphatic: Abdominal aorta is normal caliber. There is no retroperitoneal or periportal lymphadenopathy. No pelvic lymphadenopathy. Reproductive: Post hysterectomy Other: No free fluid. Musculoskeletal: No pelvic fracture spine fracture IMPRESSION: Chest. 1. No evidence of thoracic trauma. 2. Small bilateral pleural effusions. 3. Cardiomegaly. Abdomen / Pelvis. 1. No evidence of solid organ injury in the abdomen pelvis. 2. Large LEFT inguinal hernia sac contains nonobstructed descending colon. 3. Mixed density renal cysts are favored benign hemorrhagic and nonhemorrhagic cysts. 4. Heterogeneous perfusion of the spleen is favored benign. No evidence of splenic trauma. Electronically Signed   By: Suzy Bouchard M.D.   On: 04/15/2019 14:38   ECHOCARDIOGRAM COMPLETE  Result Date: 04/16/2019    ECHOCARDIOGRAM REPORT   Patient Name:   [name] Date of Exam: 04/16/2019 Medical Rec #:  [id number]         Height:       67.0 in Accession #:    [id number]        Weight:       140.0 lb Date of Birth:  06-23-1929         BSA:          1.738 m Patient Age:    18 years          BP:           128/68 mmHg Patient Gender: F  HR:           68 bpm. Exam Location:  Inpatient Procedure: 2D Echo, Color Doppler and Cardiac Doppler Indications:    R01.1 Murmur  History:        Patient has prior history of Echocardiogram examinations, most                 recent 09/05/2018. CHF, Pacemaker, Arrythmias:Atrial                 Fibrillation; Risk Factors:Hypertension and Dyslipidemia. 54mm                 Biocor Mitral Valve Replacement in 2011.                  Mitral Valve: 29 mm Biocor bioprosthetic valve valve is present                 in the mitral position. Procedure Date: 2011.  Sonographer:    Raquel Sarna Senior RDCS Referring Phys: [id number] Lequita Halt  Sonographer Comments: Technically difficult apical window due to small rib spacing.  IMPRESSIONS  1. Left ventricular ejection fraction, by estimation, is 50 to 55%. The left ventricle has low normal function. The left ventricle has no regional wall motion abnormalities. Left ventricular diastolic function could not be evaluated. Left ventricular diastolic function could not be evaluated.  2. Right ventricular systolic function is normal. The right ventricular size is mildly enlarged. There is moderately elevated pulmonary artery systolic pressure.  3. Left atrial size was severely dilated.  4. Right atrial size was severely dilated.  5. The mitral valve has been repaired/replaced. No evidence of mitral valve regurgitation. Mild mitral stenosis. The mean mitral valve gradient is 7.0 mmHg with average heart rate of 70 bpm. There is a 29 mm Biocor bioprosthetic valve present in the mitral position. Procedure Date: 2011. Echo findings are consistent with normal structure and function of the mitral valve prosthesis.  6. Tricuspid valve regurgitation is moderate.  7. The aortic valve is tricuspid. Aortic valve regurgitation is mild. Mild aortic valve sclerosis is present, with no evidence of aortic valve stenosis.  8. There is borderline dilatation of the ascending aorta measuring 37 mm.  9. The inferior vena cava is dilated in size with >50% respiratory variability, suggesting right atrial pressure of 8 mmHg. Comparison(s): Changes from prior study are noted. Left ventricular systolic function is lower, but there are no regional wall motion abnormalities. There is no change in mitral prosthesis gradients, allowing for changes in heart rate. FINDINGS  Left Ventricle: Left ventricular ejection fraction, by estimation, is 50 to 55%. The left ventricle has low normal function. The left ventricle has no regional wall motion abnormalities. The left ventricular internal cavity size was normal in size. There is no left ventricular hypertrophy. Abnormal (paradoxical) septal motion consistent with post-operative  status. Left ventricular diastolic function could not be evaluated due to mitral valve replacement. Left ventricular diastolic function could not be evaluated. Right Ventricle: The right ventricular size is mildly enlarged. No increase in right ventricular wall thickness. Right ventricular systolic function is normal. There is moderately elevated pulmonary artery systolic pressure. The tricuspid regurgitant velocity is 3.24 m/s, and with an assumed right atrial pressure of 8 mmHg, the estimated right ventricular systolic pressure is [id number] mmHg. Left Atrium: Left atrial size was severely dilated. Right Atrium: Right atrial size was severely dilated. Pericardium: There is no evidence of pericardial effusion. Mitral Valve: The mitral valve has been  repaired/replaced. No evidence of mitral valve regurgitation. There is a 29 mm Biocor bioprosthetic valve present in the mitral position. Procedure Date: 2011. Echo findings are consistent with normal structure and  function of the mitral valve prosthesis. Mild mitral valve stenosis. MV peak gradient, 18.0 mmHg. The mean mitral valve gradient is 7.0 mmHg with average heart rate of 70 bpm. Tricuspid Valve: The tricuspid valve is normal in structure. Tricuspid valve regurgitation is moderate. Aortic Valve: The aortic valve is tricuspid. Aortic valve regurgitation is mild. Mild aortic valve sclerosis is present, with no evidence of aortic valve stenosis. Pulmonic Valve: The pulmonic valve was normal in structure. Pulmonic valve regurgitation is mild to moderate. Aorta: The aortic root is normal in size and structure. There is borderline dilatation of the ascending aorta measuring 37 mm. Venous: The inferior vena cava is dilated in size with greater than 50% respiratory variability, suggesting right atrial pressure of 8 mmHg. IAS/Shunts: No atrial level shunt detected by color flow Doppler. Additional Comments: A pacer wire is visualized.  LEFT VENTRICLE PLAX 2D LVIDd:          4.90 cm LVIDs:         3.60 cm LV PW:         1.20 cm LV IVS:        0.80 cm LVOT diam:     1.90 cm LV SV:         46 LV SV Index:   26 LVOT Area:     2.84 cm  RIGHT VENTRICLE RV S prime:     6.66 cm/s TAPSE (M-mode): 1.2 cm LEFT ATRIUM              Index        RIGHT ATRIUM           Index LA diam:        5.50 cm  3.16 cm/m   RA Area:     36.50 cm LA Vol (A2C):   158.0 ml 90.92 ml/m  RA Volume:   147.00 ml 84.59 ml/m LA Vol (A4C):   179.0 ml 103.00 ml/m LA Biplane Vol: 179.0 ml 103.00 ml/m  AORTIC VALVE LVOT Vmax:   88.10 cm/s LVOT Vmean:  56.000 cm/s LVOT VTI:    0.162 m  AORTA Ao Root diam: 3.10 cm Ao Asc diam:  3.70 cm MITRAL VALVE              TRICUSPID VALVE MV Area (PHT): 1.53 cm   TR Peak grad:   42.0 mmHg MV Peak grad:  18.0 mmHg  TR Vmax:        324.00 cm/s MV Mean grad:  7.0 mmHg MV Vmax:       2.12 m/s   SHUNTS MV Vmean:      120.0 cm/s Systemic VTI:  0.16 m                           Systemic Diam: 1.90 cm Sanda Klein MD Electronically signed by Sanda Klein MD Signature Date/Time: 04/16/2019/5:02:52 PM    Final       Discharge Exam: Vitals:   04/18/19 0510 04/18/19 0918  BP: (!) 154/79 (!) 148/76  Pulse: 84 80  Resp: 18 18  Temp: 98 F (36.7 C) 98.2 F (36.8 C)  SpO2: 93% 95%   Vitals:   04/17/19 1825 04/17/19 2007 04/18/19 0510 04/18/19 0918  BP: (!) 153/79 (!) 147/72 (!) 154/79 (!) 148/76  Pulse: 74 78 84 80  Resp: 20 20 18 18   Temp: 97.7 F (36.5 C) 97.6 F (36.4 C) 98 F (36.7 C) 98.2 F (36.8 C)  TempSrc: Oral Oral Oral Oral  SpO2: 95% 96% 93% 95%  Weight:      Height:        General: Pt is alert, awake, not in acute distress, bruise around left eye Cardiovascular: RRR, S1/S2 +, no rubs, no gallops Respiratory: CTA bilaterally, no wheezing, no rhonchi Abdominal: Soft, NT, ND, bowel sounds + Extremities: no edema, no cyanosis, large hematoma on the medial side of the right leg.    The results of significant diagnostics from this hospitalization  (including imaging, microbiology, ancillary and laboratory) are listed below for reference.     Microbiology: Recent Results (from the past 240 hour(s))  SARS CORONAVIRUS 2 (TAT 6-24 HRS) Nasopharyngeal Nasopharyngeal Swab     Status: None   Collection Time: 04/15/19  4:54 PM   Specimen: Nasopharyngeal Swab  Result Value Ref Range Status   SARS Coronavirus 2 NEGATIVE NEGATIVE Final    Comment: (NOTE) SARS-CoV-2 target nucleic acids are NOT DETECTED. The SARS-CoV-2 RNA is generally detectable in upper and lower respiratory specimens during the acute phase of infection. Negative results do not preclude SARS-CoV-2 infection, do not rule out co-infections with other pathogens, and should not be used as the sole basis for treatment or other patient management decisions. Negative results must be combined with clinical observations, patient history, and epidemiological information. The expected result is Negative. Fact Sheet for Patients: SugarRoll.be Fact Sheet for Healthcare Providers: https://www.woods-mathews.com/ This test is not yet approved or cleared by the [place] FDA and  has been authorized for detection and/or diagnosis of SARS-CoV-2 by FDA under an Emergency Use Authorization (EUA). This EUA will remain  in effect (meaning this test can be used) for the duration of the COVID-19 declaration under Section 56 4(b)(1) of the Act, 21 U.S.C. section 360bbb-3(b)(1), unless the authorization is terminated or revoked sooner. Performed at [facility], [place] [address] [place], [place] [address]   Culture, Urine     Status: Abnormal   Collection Time: 04/16/19  9:22 AM   Specimen: Urine, Random  Result Value Ref Range Status   Specimen Description URINE, RANDOM  Final   Special Requests   Final    NONE Performed at [facility], [place] [address] [place], [place] [address]    Culture >=100,000 COLONIES/mL ESCHERICHIA COLI  (A)  Final   Report Status 04/18/2019 FINAL  Final   Organism ID, Bacteria ESCHERICHIA COLI (A)  Final      Susceptibility   Escherichia coli - MIC*    AMPICILLIN <=2 SENSITIVE Sensitive     CEFAZOLIN <=4 SENSITIVE Sensitive     CEFTRIAXONE <=0.25 SENSITIVE Sensitive     CIPROFLOXACIN >=4 RESISTANT Resistant     GENTAMICIN <=1 SENSITIVE Sensitive     IMIPENEM <=0.25 SENSITIVE Sensitive     NITROFURANTOIN <=16 SENSITIVE Sensitive     TRIMETH/SULFA <=20 SENSITIVE Sensitive     AMPICILLIN/SULBACTAM <=2 SENSITIVE Sensitive     PIP/TAZO <=4 SENSITIVE Sensitive     * >=100,000 COLONIES/mL ESCHERICHIA COLI     Labs: BNP (last 3 results) No results for input(s): BNP in the last 8760 hours. Basic Metabolic Panel: Recent Labs  Lab 04/15/19 1310 04/15/19 1316 04/16/19 1039 04/17/19 0436 04/18/19 0248  NA 142 139 142 143 144  K 3.8 5.3* 4.2 4.0 4.1  CL 105 105 108 109 110  CO2 28  --  30 27 25   GLUCOSE 96 89 108* 86 86  BUN 10 14 13 16 17   CREATININE 0.98 0.80 0.91 0.98 0.84  CALCIUM 10.3  --  10.0 9.5 9.6   Liver Function Tests: Recent Labs  Lab 04/15/19 1310  AST 28  ALT 16  ALKPHOS 49  BILITOT 1.3*  PROT 5.4*  ALBUMIN 3.5   No results for input(s): LIPASE, AMYLASE in the last 168 hours. No results for input(s): AMMONIA in the last 168 hours. CBC: Recent Labs  Lab 04/15/19 1310 04/15/19 1316 04/16/19 0521 04/17/19 0436 04/18/19 0248  WBC 4.5  --  4.8 4.3 4.5  NEUTROABS 2.9  --   --   --   --   HGB 13.0 13.9 11.9* 11.4* 11.9*  HCT 41.8 41.0 36.8 35.7* 37.9  MCV 97.9  --  94.6 94.9 97.2  PLT 87*  --  75* 71* 66*   Cardiac Enzymes: No results for input(s): CKTOTAL, CKMB, CKMBINDEX, TROPONINI in the last 168 hours. BNP: Invalid input(s): POCBNP CBG: Recent Labs  Lab 04/16/19 0340  GLUCAP 85   D-Dimer No results for input(s): DDIMER in the last 72 hours. Hgb A1c No results for input(s): HGBA1C in the last 72 hours. Lipid Profile No results for  input(s): CHOL, HDL, LDLCALC, TRIG, CHOLHDL, LDLDIRECT in the last 72 hours. Thyroid function studies No results for input(s): TSH, T4TOTAL, T3FREE, THYROIDAB in the last 72 hours.  Invalid input(s): FREET3 Anemia work up Recent Labs    04/17/19 1436  VITAMINB12 341  FERRITIN 48  TIBC 339  IRON 29  RETICCTPCT 0.7   Urinalysis    Component Value Date/Time   COLORURINE YELLOW 04/15/2019 1446   APPEARANCEUR HAZY (A) 04/15/2019 1446   LABSPEC 1.015 04/15/2019 1446   PHURINE 9.0 (H) 04/15/2019 1446   GLUCOSEU NEGATIVE 04/15/2019 1446   HGBUR SMALL (A) 04/15/2019 1446   BILIRUBINUR NEGATIVE 04/15/2019 1446   KETONESUR NEGATIVE 04/15/2019 1446   PROTEINUR NEGATIVE 04/15/2019 1446   NITRITE POSITIVE (A) 04/15/2019 1446   LEUKOCYTESUR TRACE (A) 04/15/2019 1446   Sepsis Labs Invalid input(s): PROCALCITONIN,  WBC,  LACTICIDVEN Microbiology Recent Results (from the past 240 hour(s))  SARS CORONAVIRUS 2 (TAT 6-24 HRS) Nasopharyngeal Nasopharyngeal Swab     Status: None   Collection Time: 04/15/19  4:54 PM   Specimen: Nasopharyngeal Swab  Result Value Ref Range Status   SARS Coronavirus 2 NEGATIVE NEGATIVE Final    Comment: (NOTE) SARS-CoV-2 target nucleic acids are NOT DETECTED. The SARS-CoV-2 RNA is generally detectable in upper and lower respiratory specimens during the acute phase of infection. Negative results do not preclude SARS-CoV-2 infection, do not rule out co-infections with other pathogens, and should not be used as the sole basis for treatment or other patient management decisions. Negative results must be combined with clinical observations, patient history, and epidemiological information. The expected result is Negative. Fact Sheet for Patients: SugarRoll.be Fact Sheet for Healthcare Providers: https://www.woods-mathews.com/ This test is not yet approved or cleared by the [place] FDA and  has been authorized for  detection and/or diagnosis of SARS-CoV-2 by FDA under an Emergency Use Authorization (EUA). This EUA will remain  in effect (meaning this test can be used) for the duration of the COVID-19 declaration under Section 56 4(b)(1) of the Act, 21 U.S.C. section 360bbb-3(b)(1), unless the authorization is terminated or revoked sooner. Performed at [facility], [place]  [address], [place], [place] [address]   Culture, Urine     Status: Abnormal   Collection Time: 04/16/19  9:22 AM   Specimen: Urine, Random  Result Value Ref Range Status   Specimen Description URINE, RANDOM  Final   Special Requests   Final    NONE Performed at [facility], [place] [address], [place], [place] [address]    Culture >=100,000 COLONIES/mL ESCHERICHIA COLI (A)  Final   Report Status 04/18/2019 FINAL  Final   Organism ID, Bacteria ESCHERICHIA COLI (A)  Final      Susceptibility   Escherichia coli - MIC*    AMPICILLIN <=2 SENSITIVE Sensitive     CEFAZOLIN <=4 SENSITIVE Sensitive     CEFTRIAXONE <=0.25 SENSITIVE Sensitive     CIPROFLOXACIN >=4 RESISTANT Resistant     GENTAMICIN <=1 SENSITIVE Sensitive     IMIPENEM <=0.25 SENSITIVE Sensitive     NITROFURANTOIN <=16 SENSITIVE Sensitive     TRIMETH/SULFA <=20 SENSITIVE Sensitive     AMPICILLIN/SULBACTAM <=2 SENSITIVE Sensitive     PIP/TAZO <=4 SENSITIVE Sensitive     * >=100,000 COLONIES/mL ESCHERICHIA COLI     Time coordinating discharge: Over 30 minutes  SIGNED:   Darliss Cheney, MD  [facility] 04/18/2019, 12:02 PM  If 7PM-7AM, please contact night-coverage [email or web address]

## 2019-04-18 NOTE — Telephone Encounter (Signed)
New Message    Pt c/o medication issue:  1. Name of Medication: ELIQUIS 5 MG TABS tablet  2. How are you currently taking this medication (dosage and times per day)? 5 mg 2 x daily   3. Are you having a reaction (difficulty breathing--STAT)? No   4. What is your medication issue? Pts son is calling and says the Hospital Dr wants her to stop taking the Eliquis  Pts son is calling to see if this is okay     Please call

## 2019-04-18 NOTE — Telephone Encounter (Signed)
I think it is okay for her to hold the Eliquis for a few days.  Risk of a stroke is low with temporary interruption (up to a week).  I am much more concerned about her falling than I am about the relatively mild reduction in her platelets.

## 2019-04-18 NOTE — Telephone Encounter (Signed)
This is the wrong chart refer to chart # [id number].

## 2019-04-18 NOTE — Progress Notes (Signed)
Patient was discharged to home, AVS was reviewed with her and her son. All questions answered. Patient's son confirmed she had all belongings. No new prescriptions. IV was removed and tele box was returned. Volunteer assisted patient off the floor via wheelchair, family provided transportation.

## 2019-04-18 NOTE — Discharge Instructions (Signed)
Hematoma A hematoma is a collection of blood under the skin, in an organ, in a body space, in a joint space, or in other tissue. The blood can thicken (clot) to form a lump that you can see and feel. The lump is often firm and may become sore and tender. Most hematomas get better in a few days to weeks. However, some hematomas may be serious and require medical care. Hematomas can range from very small to very large. What are the causes? This condition is caused by:  A blunt or penetrating injury.  A leakage from a blood vessel under the skin.  Some medical procedures, including surgeries, such as oral surgery, face lifts, and surgeries on the joints.  Some medical conditions that cause bleeding or bruising. There may be multiple hematomas that appear in different areas of the body. What increases the risk? You are more likely to develop this condition if:  You are an older adult.  You use blood thinners. What are the signs or symptoms?  Symptoms of this condition depend on where the hematoma is located.  Common symptoms of a hematoma that is under the skin include:  A firm lump on the body.  Pain and tenderness in the area.  Bruising. Blue, dark blue, purple-red, or yellowish skin (discoloration) may appear at the site of the hematoma if the hematoma is close to the surface of the skin. Common symptoms of a hematoma that is deep in the tissues or body spaces may be less obvious. They include:  A collection of blood in the stomach (intra-abdominal hematoma). This may cause pain in the abdomen, weakness, fainting, and shortness of breath.  A collection of blood in the head (intracranial hematoma). This may cause a headache or symptoms such as weakness, trouble speaking or understanding, or a change in consciousness. How is this diagnosed? This condition is diagnosed based on:  Your medical history.  A physical exam.  Imaging tests, such as an ultrasound or CT scan. These may  be needed if your health care provider suspects a hematoma in deeper tissues or body spaces.  Blood tests. These may be needed if your health care provider believes that the hematoma is caused by a medical condition. How is this treated? Treatment for this condition depends on the cause, size, and location of the hematoma. Treatment may include:  Doing nothing. The majority of hematomas do not need treatment as many of them go away on their own over time.  Surgery or close monitoring. This may be needed for large hematomas or hematomas that affect vital organs.  Medicines. Medicines may be given if there is an underlying medical cause for the hematoma. Follow these instructions at home: Managing pain, stiffness, and swelling   If directed, put ice on the affected area. ? Put ice in a plastic bag. ? Place a towel between your skin and the bag. ? Leave the ice on for 20 minutes, 2-3 times a day for the first couple of days.  If directed, apply heat to the affected area after applying ice for a couple of days. Use the heat source that your health care provider recommends, such as a moist heat pack or a heating pad. ? Place a towel between your skin and the heat source. ? Leave the heat on for 20-30 minutes. ? Remove the heat if your skin turns bright red. This is especially important if you are unable to feel pain, heat, or cold. You may have a greater   risk of getting burned.  Raise (elevate) the affected area above the level of your heart while you are sitting or lying down.  If told, wrap the affected area with an elastic bandage. The bandage applies pressure (compression) to the area, which may help to reduce swelling and promote healing. Do not wrap the bandage too tightly around the affected area.  If your hematoma is on a leg or foot (lower extremity) and is painful, your health care provider may recommend crutches. Use them as told by your health care provider. General  instructions  Take over-the-counter and prescription medicines only as told by your health care provider.  Keep all follow-up visits as told by your health care provider. This is important. Contact a health care provider if:  You have a fever.  The swelling or discoloration gets worse.  You develop more hematomas. Get help right away if:  Your pain is worse or your pain is not controlled with medicine.  Your skin over the hematoma breaks or starts bleeding.  Your hematoma is in your chest or abdomen and you have weakness, shortness of breath, or a change in consciousness.  You have a hematoma on your scalp that is caused by a fall or injury, and you also have: ? A headache that gets worse. ? Trouble speaking or understanding speech. ? Weakness. ? Change in alertness or consciousness. Summary  A hematoma is a collection of blood under the skin, in an organ, in a body space, in a joint space, or in other tissue.  This condition usually does not need treatment because many hematomas go away on their own over time.  Large hematomas, or those that may affect vital organs, may need surgical drainage or monitoring. If the hematoma is caused by a medical condition, medicines may be prescribed.  Get help right away if your hematoma breaks or starts to bleed, you have shortness of breath, or you have a headache or trouble speaking after a fall. This information is not intended to replace advice given to you by your health care provider. Make sure you discuss any questions you have with your health care provider. Document Revised: 06/27/2018 Document Reviewed: 07/06/2017 Elsevier Patient Education  2020 Elsevier Inc.  

## 2019-04-18 NOTE — Telephone Encounter (Signed)
Spoke with pt son, patient is currently admitted and there is a question about whether or not to hold the eliquis because of her platelets being low. Discussed with dr Gardiner Rhyme, DOD, the patient is at low risk if she holds the eliquis for a few days but also she can take the eliquis and have early blood work next week. Patient son made aware. He would alsdo like to know what dr croitoru would like for him to do. Aware dr c is not in the offce today but will forward for his review and advise.

## 2019-04-19 LAB — FOLATE RBC
Folate, Hemolysate: 401 ng/mL
Folate, RBC: 1130 ng/mL (ref >498)
Hematocrit: 35.5 % (ref 34.0–46.6)

## 2019-04-23 ENCOUNTER — Telehealth: Payer: Self-pay | Admitting: Cardiovascular Disease

## 2019-04-23 ENCOUNTER — Encounter: Payer: Self-pay | Admitting: Cardiology

## 2019-04-23 NOTE — Telephone Encounter (Signed)
In my opinion, she should restart the Eliquis now.

## 2019-04-23 NOTE — Telephone Encounter (Signed)
Left message to call back  

## 2019-04-23 NOTE — Telephone Encounter (Signed)
error 

## 2019-04-23 NOTE — Telephone Encounter (Signed)
   Pt c/o medication issue:  1. Name of Medication:  ELIQUIS 5 MG TABS tablet  2. How are you currently taking this medication (dosage and times per day)?   3. Are you having a reaction (difficulty breathing--STAT)?   4. What is your medication issue? Pt's son called and said, pt was hospitalized and they stopped her eliquis, he wanted to ask Dr. Sallyanne Kuster when the pt can continue taking the it. Pt has blood work scheduled on Friday w/ her pcp.  Please advise

## 2019-04-23 NOTE — Telephone Encounter (Signed)
I spoke with patient's son. He reports patient is not able to see PCP and have lab work done until this Friday.  (see previous phone note). Patient has been off Eliquis since recent hospitalization.  Patient's son is asking if Dr Sallyanne Kuster feels patient should resume Eliquis now or wait until lab work done by PCP on Friday.

## 2019-04-24 NOTE — Telephone Encounter (Signed)
Returned the call to the patient's son. He has been advised to restart the Eliquis 5 mg bid. He will restart that tonight.   He stated that the patient has gained 4 pounds since she has been discharged from the hospital. Her weight this morning was 143.3 pounds. She also has bilateral lower extremity edema. They restarted the Furosemide 40 mg yesterday.  She has an appointment with her PCP on Friday. He has been advised to call back if he feels like the Furosemide is not helping.

## 2019-04-24 NOTE — Telephone Encounter (Signed)
Follow Up  Patient's son is returning call. Please give patient's son a call back.

## 2019-05-29 LAB — CUP PACEART REMOTE DEVICE CHECK
Battery Remaining Longevity: 106 mo
Battery Voltage: 3.09 V
Brady Statistic AP VP Percent: 0 %
Brady Statistic AP VS Percent: 0 %
Brady Statistic AS VP Percent: 45.98 %
Brady Statistic AS VS Percent: 54.02 %
Brady Statistic RA Percent Paced: 0 %
Brady Statistic RV Percent Paced: 45.98 %
Date Time Interrogation Session: 20210413070025
Implantable Lead Implant Date: 20090112
Implantable Lead Implant Date: 20090112
Implantable Lead Location: 753859
Implantable Lead Location: 753860
Implantable Lead Model: 4092
Implantable Lead Model: 5076
Implantable Pulse Generator Implant Date: 20200706
Lead Channel Impedance Value: 304 Ohm
Lead Channel Impedance Value: 380 Ohm
Lead Channel Impedance Value: 380 Ohm
Lead Channel Impedance Value: 532 Ohm
Lead Channel Pacing Threshold Amplitude: 2.125 V
Lead Channel Pacing Threshold Pulse Width: 0.4 ms
Lead Channel Sensing Intrinsic Amplitude: 0.875 mV
Lead Channel Sensing Intrinsic Amplitude: 4 mV
Lead Channel Sensing Intrinsic Amplitude: 4 mV
Lead Channel Setting Pacing Amplitude: 4.5 V
Lead Channel Setting Pacing Pulse Width: 0.4 ms
Lead Channel Setting Sensing Sensitivity: 1.2 mV

## 2019-05-30 ENCOUNTER — Ambulatory Visit (INDEPENDENT_AMBULATORY_CARE_PROVIDER_SITE_OTHER): Payer: Medicare PPO | Admitting: *Deleted

## 2019-05-30 DIAGNOSIS — I495 Sick sinus syndrome: Secondary | ICD-10-CM | POA: Diagnosis not present

## 2019-05-30 NOTE — Progress Notes (Signed)
PPM Remote  

## 2019-07-04 ENCOUNTER — Telehealth: Payer: Self-pay | Admitting: Internal Medicine

## 2019-07-04 ENCOUNTER — Telehealth: Payer: Self-pay

## 2019-07-04 NOTE — Telephone Encounter (Signed)
Called to clarify with the son when he stated "comfort care". He stated that the skilled nursing that helps 24/7 is called [facility] and they provide comfort care but the patient is not with hospice or palliative.  She has a pacer appointment on 07/09/19 at [facility] to adjust RV output.

## 2019-07-04 NOTE — Telephone Encounter (Signed)
Spoke with the patient's son, per the dpr. He stated that the patient now has 24/7 hour care and is in comfort care.   He stated that the patient has been doing well lately and denies shortness of breath. Her weight fluctuates around 133 lbs and has not increased. He stated that the patient does get anxious when she has to get up for baths and to ambulate due to her fall in March.

## 2019-07-04 NOTE — Telephone Encounter (Signed)
Carelink alert received 07/04/19 for 7 NSVT episodes. Per med list + Eliquis, Lasix. Attempted to call patient to assess, no answer. LMOVM.

## 2019-07-04 NOTE — Telephone Encounter (Signed)
Received [employer] Carelink alert tonight.  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   Alert is regarding RV pacing thresholds that have been trending up, now triggering red alerts over the last 3 days. She is still pacing appropriately, approximately 70% on current interrogation. Additionally there were 7 NSVT runs since last device check, unrelated.   Dr. Sallyanne Kuster notified.

## 2019-07-04 NOTE — Telephone Encounter (Signed)
May need to be seen in office for increasing pacer thresholds, could be worsening heart failure. Please check with patient to see if she is gaining weight or more short of breath.  Dr Lemmie Evens

## 2019-07-04 NOTE — Telephone Encounter (Signed)
RV output noted on adaptive. Called patient spoke to [facility]), and agreed to have patient come in for APP apt. To have RV threshold reprogrammed according to test results. Explained this could help reserve the battery life of the device. Son states someone will need to assist patient, explained that will be fine, added to apt. Notes.

## 2019-07-04 NOTE — Telephone Encounter (Signed)
Ok .. if she is in comfort care, continue current measures.  Dr Lemmie Evens

## 2019-07-05 ENCOUNTER — Telehealth: Payer: Self-pay | Admitting: Cardiovascular Disease

## 2019-07-05 NOTE — Telephone Encounter (Signed)
Ok thanks .. that was not clear.  Dr Lemmie Evens

## 2019-07-05 NOTE — Telephone Encounter (Signed)
Transferred call to device clinic.  

## 2019-07-08 NOTE — Progress Notes (Signed)
Cardiology Office Note Date:  07/09/2019  Patient ID:  Leda, Mcrorie June 04, 1929, MRN [id number] PCP:  Lawerance Cruel, MD  Cardiologist:  Dr. Sallyanne Kuster     Chief Complaint: abnormal remote  History of Present Illness: Cleta Antkowiak is a 86 y.o. female with history of permanent AFib, PPM, HTN, HLD, chronic CHF (diastolic), VHD w/MVR ()bioprosthetic), stroke  Numerous phone notes, last clinic visit was Nov/2020 with Dr. Loletha Grayer via tele-health. He reports "For a long time she was not receiving anticoagulants due to a perceived increased risk of bleeding due to frequent falls.  On July 22 she underwent a TEE to evaluate for bacteremia and look at her mitral valve, which incidentally showed large mobile thrombi in the left atrium.  She was subsequently started on anticoagulants" Suspected over-diuresed at that visit with some fatigue, weakness, and 8lb weight loss, changing her lasix and K+ from daily to PRN. Discussed prosthetic MV stenosis, not felt to be a good candidate for re-do operation, given quite sedentary, hoping she would not become symptomatic.  Device clinic phone note 5/20, discussed NSVT episodes, though also makes mention that RV is programmed auto threshold to come in for lead check and programming to aid in battery longevity.   She is accomapnied today by her son.  She is observed to ambulate with her walker and did well.  She had a fall in march, was walking without her walker and fell, they both deny this being syncope.  She had a number of contusions, bruises but no fractures and back on her Eliquis. She has not fallen again and is getting hme health, has an aid they say who is doing wonders for her and is doing quite well with increasing exertional capacity and tolerances.    No CP, palpitations or cardiac awareness, no dizzy spells, near syncope or syncope.  No SOB, DOE.  She cut her cuticle too short once and bleed quite a bit, no other bleeding or signs of  bleeding  Device information MDT dual chamber PPM implanted, 2009,  gen change 08/20/2018 Programmed VVIR   Past Medical History:  Diagnosis Date  . Anxiety   . Arthritis    "mostly my hands" (01/29/2015)  . Basal cell carcinoma of forehead   . Benign essential tremor   . CAP (community acquired pneumonia) 01/29/2015  . CHF (congestive heart failure) ([place])   . Dementia ([place]) 10/18/2018  . Depression   . Edema of both legs 04-23-2013  pt states feet swelling (wears ted hose)   bilateral venous ablation procedures done in IR, L>R  . GERD (gastroesophageal reflux disease)   . H/O hiatal hernia   . Hyperlipidemia   . Hyperparathyroidism ([place])    MILD--  STABLE  . Hypertension   . Kidney stones   . Osteopenia   . Permanent atrial fibrillation ([place])    CARDIOLOGIST-- DR [id number]  . Pneumonia "several times"  . Presence of permanent cardiac pacemaker    LAST PACER CHECK  04-23-2013  IN [facility]  . Right ureteral stone   . S/P mitral valve replacement with bioprosthetic valve    04-03-2009  . Schatzki's ring   . Sick sinus syndrome ([place])    S/P  PACEMAKER  2009  . Stroke ([place]) 08/2018  . Stroke ([place])   . Thrombocytopenia, immune ([place])    CHRONIC---  HEMOTOLOGIST---  DR ENNEVER  . Venous insufficiency, peripheral   . Wears glasses     Past Surgical History:  Procedure Laterality Date  . CARDIAC CATHETERIZATION  03/27/2009   normal coronaries; grade IV Mitral Insuff.  Marland Kitchen CARDIAC CATHETERIZATION  2001  &  2006  . CARDIAC VALVE REPLACEMENT    . CATARACT EXTRACTION W/ INTRAOCULAR LENS  IMPLANT, BILATERAL Bilateral ~ 2012  . CYSTOSCOPY W/ URETERAL STENT PLACEMENT Left 08/01/2012   Procedure: CYSTOSCOPY WITH RETROGRADE PYELOGRAM/URETERAL STENT PLACEMENT;  Surgeon: Dutch Gray, MD;  Location: [facility];  Service: Urology;  Laterality: Left;  . CYSTOSCOPY WITH URETEROSCOPY Left 08/14/2012   Procedure: LEFT URETEROSCOPY WITH HOLMIUM LASER AND LEFT  STENT PLACEMENT;  Surgeon: Malka So, MD;   Location: [facility];  Service: Urology;  Laterality: Left;  . CYSTOSCOPY WITH URETEROSCOPY AND STENT PLACEMENT Right 04/25/2013   Procedure: RIGHT URETEROSCOPY WITH  STENT PLACEMENT;  Surgeon: Irine Seal, MD;  Location: [facility];  Service: Urology;  Laterality: Right;  . ESOPHAGOGASTRODUODENOSCOPY (EGD) WITH ESOPHAGEAL DILATION  "a couple times" (01/29/2015)  . EXTRACORPOREAL SHOCK WAVE LITHOTRIPSY Left 07-26-2012  . HAND SURGERY Right 05/2011   "no fracture; straightened things up  . HAND SURGERY Left 2014   "no fracture; straightened things up"  . HOLMIUM LASER APPLICATION Left [id number]   Procedure:  HOLMIUM LASER APPLICATION;  Surgeon: Malka So, MD;  Location: [facility];  Service: Urology;  Laterality: Left;  . HOLMIUM LASER APPLICATION Right [id number]   Procedure: HOLMIUM LASER APPLICATION;  Surgeon: Irine Seal, MD;  [facility];  Service: Urology;  Laterality: Right;  . MITRAL VALVE REPLACEMENT WITH CHORDAL PRESEVATION USING BIOPROSTHETIC VALVE/  LIGATION LEFT ATRIAL APPENDAGE/  LEFT-SIDED MAZE PROCEDURE  04-03-2009  DR Ethel Rana,  SERIAL # [id number]  . NM MYOCAR PERF WALL MOTION  03/01/1999   mild lateral ischemia  . PERMANENT PACEMAKER INSERTION  02/26/2007   Dual Chamber Medtronic Programmed VVIR--- Generator [id number]/  [id number]   . PPM GENERATOR CHANGEOUT N/A 08/20/2018   Procedure: PPM GENERATOR CHANGEOUT;  Surgeon: Sanda Klein, MD;  Location: [facility];  Service: Cardiovascular;  Laterality: N/A;  . TEE WITHOUT CARDIOVERSION N/A 09/05/2018   Procedure: TRANSESOPHAGEAL ECHOCARDIOGRAM (TEE);  Surgeon: Pixie Casino, MD;  Location: [facility];  Service: Cardiovascular;  Laterality: N/A;  . TRANSTHORACIC ECHOCARDIOGRAM  09-07-2012  DR CROITORU   MILD LVH/  EF 55-60%/  MILD TO MODERATE AR/   SEVERE LAE  &  RAE/  MILD TO MODERATE TR/   MITRAL VALVE BIOPROSTHESIS LEAFLET NORMAL,  MODERATE STENOSIS WITH NO SIG. REGURG/   MODERATE RVE  . VAGINAL HYSTERECTOMY  1982    Current Outpatient Medications  Medication Sig Dispense Refill  . acetaminophen (TYLENOL) 500 MG tablet Take 500-1,000 mg by mouth daily as needed for moderate pain or headache.     . ALPRAZolam (XANAX) 0.5 MG tablet Take 0.5-1 tablets (0.25-0.5 mg total) by mouth See admin instructions. Take 1/2 tablet every morning and take 1 tablet at night 10 tablet 0  . Ascorbic Acid (VITAMIN C) 1000 MG tablet Take 500 mg by mouth daily.     Marland Kitchen b complex vitamins capsule Take 1 capsule by mouth daily.    . cephALEXin (KEFLEX) 500 MG capsule Take 500 mg by mouth daily. For five days    . Cholecalciferol (VITAMIN D) 50 MCG (2000 UT) tablet Take 2,000 Units by mouth daily.    . CVS VITAMIN E 1000 units capsule Take 450 capsules by mouth daily.    Marland Kitchen donepezil (ARICEPT) 5 MG tablet  Take 5 mg by mouth at bedtime.    Marland Kitchen ELIQUIS 5 MG TABS tablet Take 5 mg by mouth 2 (two) times daily.    . furosemide (LASIX) 40 MG tablet Take 40 mg by mouth daily as needed for fluid.    . Melatonin 10 MG TABS Take 1 tablet by mouth at bedtime.    . nitrofurantoin, macrocrystal-monohydrate, (MACROBID) 100 MG capsule Take 100 mg by mouth daily. For 7 days only    . Nutritional Supplements (IMMUNE ENHANCE) TABS Take 1 tablet by mouth daily.    Marland Kitchen olmesartan (BENICAR) 20 MG tablet Take 1 tablet (20 mg total) by mouth daily. 30 tablet 11  . omeprazole (PRILOSEC) 20 MG capsule Take 20 mg by mouth daily.    Marland Kitchen OVER THE COUNTER MEDICATION Take 1 tablet by mouth 2 (two) times daily. Nutrifuron (immune supplement)    . potassium chloride (KLOR-CON) 8 MEQ tablet Take 8 mEq by mouth See admin instructions. As needed if taking furosemide    . Probiotic Product (PROBIOTIC-10 ULTIMATE PO) Take 1 capsule by mouth daily.    . simvastatin (ZOCOR) 10 MG tablet TAKE 1 TABLET BY MOUTH EVERY DAY AT DINNER (Patient taking differently: Take 10 mg by mouth at bedtime. ) 30 tablet 0  . Vit B6-Vit B12-Omega 3  Acids (VITAMIN B PLUS+ PO) Take 1 tablet by mouth daily.     No current facility-administered medications for this visit.    Allergies:   Zoloft [sertraline hcl]   Social History:  The patient  reports that she has never smoked. She has never used smokeless tobacco. She reports that she does not drink alcohol or use drugs.   Family History:  The patient's family history includes Cancer in her mother; Heart attack in her father.  ROS:  Please see the history of present illness. All other systems are reviewed and otherwise negative.   PHYSICAL EXAM:  VS:  BP (!) 142/82   Pulse 62   Ht 5\' 4"  (1.626 m)   Wt 135 lb (61.2 kg)   SpO2 96%   BMI 23.17 kg/m  BMI: Body mass index is 23.17 kg/m. Well nourished, well developed, in no acute distress  HEENT: normocephalic, atraumatic  Neck: no JVD, carotid bruits or masses Cardiac:  irreg-irreg; Sfft SM/DM no rubs, or gallops Lungs:  *CTA b/l, no wheezing, rhonchi or rales  Abd: soft, nontender MS: no deformity, age appropriate atrophy Ext:  trace edema  Skin: warm and dry, no rash Neuro:  No gross deficits appreciated Psych: euthymic mood, full affect  PPM site is stable, no tethering or discomfort, she is thin, no skin changes or threatened erosion    EKG:  Not done today  PPM interrogation done today and reviewed by myself:  Battery estimate initially 5.1 years, RV lead auto threshold is "high" and current output 5.00V/1.63ms Programmed VVIR VP 49.9% Lead impedance and sensing are good/stable Threshold testing noted  Bipolar pacing 2.5V/0.11ms 2.25V/1.45ms 2.0/1.00 2.0/1.00  Unipolar pacing 2.25V/0.63ms 1.75V/1.55ms 1.75/1.00 1.75/1.00  35 NSVT episodes Morphology appears unchanged and suspect these are AFib w/RVR, some also seem to slow, not stop abruptly, some slightly irregular. Longest 3 seconds  Programming changes made RV lead output to 3.5V/1.56ms Pacing configuration to unipolar (sensing remains  bipolar) Capture management to monitor  New battery estimate is 7.0years     04/16/2019: TTE IMPRESSIONS  1. Left ventricular ejection fraction, by estimation, is 50 to 55%. The  left ventricle has low normal function. The left  ventricle has no regional  wall motion abnormalities. Left ventricular diastolic function could not  be evaluated. Left ventricular  diastolic function could not be evaluated.  2. Right ventricular systolic function is normal. The right ventricular  size is mildly enlarged. There is moderately elevated pulmonary artery  systolic pressure.  3. Left atrial size was severely dilated.  4. Right atrial size was severely dilated.  5. The mitral valve has been repaired/replaced. No evidence of mitral  valve regurgitation. Mild mitral stenosis. The mean mitral valve gradient  is 7.0 mmHg with average heart rate of 70 bpm. There is a 29 mm Biocor  bioprosthetic valve present in the  mitral position. Procedure Date: 2011. Echo findings are consistent with  normal structure and function of the mitral valve prosthesis.  6. Tricuspid valve regurgitation is moderate.  7. The aortic valve is tricuspid. Aortic valve regurgitation is mild.  Mild aortic valve sclerosis is present, with no evidence of aortic valve  stenosis.  8. There is borderline dilatation of the ascending aorta measuring 37 mm.  9. The inferior vena cava is dilated in size with >50% respiratory  variability, suggesting right atrial pressure of 8 mmHg.   Comparison(s): Changes from prior study are noted. Left ventricular  systolic function is lower, but there are no regional wall motion  abnormalities. There is no change in mitral prosthesis gradients, allowing  for changes in heart rate.    Recent Labs: 04/15/2019: ALT 16 04/18/2019: BUN 17; Creatinine, Ser 0.84; Hemoglobin 11.9; Platelets 66; Potassium 4.1; Sodium 144  11/02/2018: Cholesterol 160; HDL 63; LDL Cholesterol 82; Total CHOL/HDL Ratio  2.5; Triglycerides 73; VLDL 15   CrCl cannot be calculated (Patient's most recent lab result is older than the maximum 21 days allowed.).   Wt Readings from Last 3 Encounters:  07/09/19 135 lb (61.2 kg)  04/15/19 140 lb (63.5 kg)  12/03/18 151 lb (68.5 kg)     Other studies reviewed: Additional studies/records reviewed today include: summarized above  ASSESSMENT AND PLAN:  1. PPM     As noted above     She is not device dependent  In review of the last few remotes/checks, her RV lead threshold has been creeping up, sensing and impedance remains OK She will see Dr. Olevia Perches next month  2. Permanent Afib     CHA2DS2Vasc is 6, on Eliquis, appropriately dosed      3. HTN     No changes today   4. VHD s/p MVR (bioprosthetic)     last echo noted above     C/w Dr. Sallyanne Kuster       Disposition: as scheduled.  I will ask the device clinic to schedule a transmission in a week or so to check on the lead.  Current medicines are reviewed at length with the patient today.  The patient did not have any concerns regarding medicines.  Venetia Night, PA-C 07/09/2019 4:55 PM     [address] [place] [address] [phone] (office)  [phone] (fax)

## 2019-07-09 ENCOUNTER — Other Ambulatory Visit: Payer: Self-pay

## 2019-07-09 ENCOUNTER — Ambulatory Visit: Payer: Medicare PPO | Admitting: Physician Assistant

## 2019-07-09 VITALS — BP 142/82 | HR 62 | Ht 64.0 in | Wt 135.0 lb

## 2019-07-09 DIAGNOSIS — Z9889 Other specified postprocedural states: Secondary | ICD-10-CM | POA: Diagnosis not present

## 2019-07-09 DIAGNOSIS — I10 Essential (primary) hypertension: Secondary | ICD-10-CM | POA: Diagnosis not present

## 2019-07-09 DIAGNOSIS — Z950 Presence of cardiac pacemaker: Secondary | ICD-10-CM

## 2019-07-09 DIAGNOSIS — I5032 Chronic diastolic (congestive) heart failure: Secondary | ICD-10-CM

## 2019-07-09 DIAGNOSIS — I4821 Permanent atrial fibrillation: Secondary | ICD-10-CM | POA: Diagnosis not present

## 2019-07-09 NOTE — Patient Instructions (Signed)
Medication Instructions:   Your physician recommends that you continue on your current medications as directed. Please refer to the Current Medication list given to you today.  *If you need a refill on your cardiac medications before your next appointment, please call your pharmacy*   Lab Work: NONE ORDERED  TODAY   If you have labs (blood work) drawn today and your tests are completely normal, you will receive your results only by: . MyChart Message (if you have MyChart) OR . A paper copy in the mail If you have any lab test that is abnormal or we need to change your treatment, we will call you to review the results.   Testing/Procedures: NONE ORDERED  TODAY    Follow-Up: At [facility], you and your health needs are our priority.  As part of our continuing mission to provide you with exceptional heart care, we have created designated Provider Care Teams.  These Care Teams include your primary Cardiologist (physician) and Advanced Practice Providers (APPs -  Physician Assistants and Nurse Practitioners) who all work together to provide you with the care you need, when you need it.  We recommend signing up for the patient portal called "MyChart".  Sign up information is provided on this After Visit Summary.  MyChart is used to connect with patients for Virtual Visits (Telemedicine).  Patients are able to view lab/test results, encounter notes, upcoming appointments, etc.  Non-urgent messages can be sent to your provider as well.   To learn more about what you can do with MyChart, go to [email or web address].    Your next appointment:  AS SCHEDULED   Other Instructions   

## 2019-07-11 DIAGNOSIS — N281 Cyst of kidney, acquired: Secondary | ICD-10-CM | POA: Diagnosis not present

## 2019-07-11 DIAGNOSIS — N390 Urinary tract infection, site not specified: Secondary | ICD-10-CM | POA: Diagnosis not present

## 2019-07-11 DIAGNOSIS — N200 Calculus of kidney: Secondary | ICD-10-CM | POA: Diagnosis not present

## 2019-07-25 DIAGNOSIS — H04123 Dry eye syndrome of bilateral lacrimal glands: Secondary | ICD-10-CM | POA: Diagnosis not present

## 2019-07-25 DIAGNOSIS — H52201 Unspecified astigmatism, right eye: Secondary | ICD-10-CM | POA: Diagnosis not present

## 2019-07-25 DIAGNOSIS — H26493 Other secondary cataract, bilateral: Secondary | ICD-10-CM | POA: Diagnosis not present

## 2019-07-25 DIAGNOSIS — H31001 Unspecified chorioretinal scars, right eye: Secondary | ICD-10-CM | POA: Diagnosis not present

## 2019-08-01 DIAGNOSIS — N200 Calculus of kidney: Secondary | ICD-10-CM | POA: Diagnosis not present

## 2019-08-01 DIAGNOSIS — N3020 Other chronic cystitis without hematuria: Secondary | ICD-10-CM | POA: Diagnosis not present

## 2019-08-01 DIAGNOSIS — R300 Dysuria: Secondary | ICD-10-CM | POA: Diagnosis not present

## 2019-08-20 DIAGNOSIS — Z20822 Contact with and (suspected) exposure to covid-19: Secondary | ICD-10-CM | POA: Diagnosis not present

## 2019-08-20 DIAGNOSIS — Z03818 Encounter for observation for suspected exposure to other biological agents ruled out: Secondary | ICD-10-CM | POA: Diagnosis not present

## 2019-08-26 DIAGNOSIS — F419 Anxiety disorder, unspecified: Secondary | ICD-10-CM | POA: Diagnosis not present

## 2019-08-29 ENCOUNTER — Ambulatory Visit (INDEPENDENT_AMBULATORY_CARE_PROVIDER_SITE_OTHER): Payer: Medicare PPO | Admitting: *Deleted

## 2019-08-29 DIAGNOSIS — I4891 Unspecified atrial fibrillation: Secondary | ICD-10-CM

## 2019-08-29 LAB — CUP PACEART REMOTE DEVICE CHECK
Battery Remaining Longevity: 83 mo
Battery Voltage: 3.01 V
Brady Statistic AP VP Percent: 0 %
Brady Statistic AP VS Percent: 0 %
Brady Statistic AS VP Percent: 83.62 %
Brady Statistic AS VS Percent: 16.38 %
Brady Statistic RA Percent Paced: 0 %
Brady Statistic RV Percent Paced: 83.62 %
Date Time Interrogation Session: 20210715023207
Implantable Lead Implant Date: 20090112
Implantable Lead Implant Date: 20090112
Implantable Lead Location: 753859
Implantable Lead Location: 753860
Implantable Lead Model: 4092
Implantable Lead Model: 5076
Implantable Pulse Generator Implant Date: 20200706
Lead Channel Impedance Value: 323 Ohm
Lead Channel Impedance Value: 342 Ohm
Lead Channel Impedance Value: 399 Ohm
Lead Channel Impedance Value: 513 Ohm
Lead Channel Pacing Threshold Amplitude: 1.375 V
Lead Channel Pacing Threshold Pulse Width: 0.4 ms
Lead Channel Sensing Intrinsic Amplitude: 0.875 mV
Lead Channel Sensing Intrinsic Amplitude: 3 mV
Lead Channel Sensing Intrinsic Amplitude: 3 mV
Lead Channel Setting Pacing Amplitude: 3.5 V
Lead Channel Setting Pacing Pulse Width: 1 ms
Lead Channel Setting Sensing Sensitivity: 1.2 mV

## 2019-09-02 ENCOUNTER — Ambulatory Visit (INDEPENDENT_AMBULATORY_CARE_PROVIDER_SITE_OTHER): Payer: Medicare PPO | Admitting: Cardiovascular Disease

## 2019-09-02 ENCOUNTER — Other Ambulatory Visit: Payer: Self-pay

## 2019-09-02 VITALS — BP 129/68 | HR 154 | Ht 64.0 in | Wt 141.0 lb

## 2019-09-02 DIAGNOSIS — I5032 Chronic diastolic (congestive) heart failure: Secondary | ICD-10-CM

## 2019-09-02 DIAGNOSIS — I10 Essential (primary) hypertension: Secondary | ICD-10-CM

## 2019-09-02 DIAGNOSIS — Z9889 Other specified postprocedural states: Secondary | ICD-10-CM

## 2019-09-02 DIAGNOSIS — I4821 Permanent atrial fibrillation: Secondary | ICD-10-CM

## 2019-09-02 DIAGNOSIS — Z950 Presence of cardiac pacemaker: Secondary | ICD-10-CM | POA: Diagnosis not present

## 2019-09-02 NOTE — Patient Instructions (Signed)

## 2019-09-02 NOTE — Progress Notes (Signed)
Remote pacemaker transmission.   

## 2019-09-02 NOTE — Progress Notes (Signed)
Cardiology Office Note   Date:  09/03/2019   ID:  [name], DOB 1929-11-21, MRN [id number]  PCP:  Christina Cruel, MD  Cardiologist:  Christina Klein, MD  Electrophysiologist:  None   Evaluation Performed:  Follow-Up Visit  Chief Complaint:  Pacemaker; atrial fibrillation  History of Present Illness:    [name] is a 86 y.o. female with with long-standing atrial fibrillation with slow ventricular response and permanent pacemaker (recent generator change July 2020, Medtronic), systemic hypertension and hyperlipidemia, chronic diastolic heart failure and a biological mitral valve prosthesis (2011, 29 mm Biocor, Dr. Prescott Gum, with left atrial appendage ligation).  As before she is accompanied by her son.  For a long time she was not receiving anticoagulants due to a perceived increased risk of bleeding due to frequent falls.  On September 05, 2018 she underwent a TEE to evaluate for bacteremia and look at her mitral valve, which incidentally showed large mobile thrombi in the left atrium.  She was subsequently started on anticoagulants.  She was hospitalized with transient aphasia on September 16.  Anticoagulation was restarted.  She has moderate prosthetic mitral valve stenosis (mean gradient 9 mmHg at 75 bpm).  She has only had 1 fall this year.  This occurred in the setting of a urinary tract infection when she got up to walk without her walker and required hospitalization in March of this year.  She developed a hematoma of her right knee which has since resolved.  She has not had other serious bleeding problems.  She has round-the-clock help at home now.  Considering her problems, she is quite mobile, roughly 2 hours a day.  She denies any angina or dyspnea either at rest or with activity and has not had lower extremity edema, dizziness or syncope.  She denies any new focal neurological events and has not had claudication.  She is not pacemaker dependent.  She paces the  ventricle roughly 80% of the time and has good ventricular rate control.  Due to an increase in RV pacing threshold her pacing output has been drastically increased to 3.5 V at 1 ms with a substantial hit to her estimated device longevity.  Capture management was changed to monitor at the same visit in May and it shows that there has been a consistent improvement in RV pacing threshold over the last month.  Manually checked pacing threshold is much better today (1.0V@0 .6 ms or 1.5V@0 .4 ms).  Part of the problem seems to be that she has frequent PVCs that might interfere with assessment of the true pacing threshold when using autocapture.   There are a few episodes of high ventricular rate (9 since May 25).  Some of these appear to be true nonsustained VT, but others likely represent atrial fibrillation with rapid ventricular response.      Past Medical History:  Diagnosis Date  . Anxiety   . Arthritis    "mostly my hands" (01/29/2015)  . Basal cell carcinoma of forehead   . Benign essential tremor   . CAP (community acquired pneumonia) 01/29/2015  . CHF (congestive heart failure) ([place])   . Dementia ([place]) 10/18/2018  . Depression   . Edema of both legs 04-23-2013  pt states feet swelling (wears ted hose)   bilateral venous ablation procedures done in IR, L>R  . GERD (gastroesophageal reflux disease)   . H/O hiatal hernia   . Hyperlipidemia   . Hyperparathyroidism ([place])    MILD--  STABLE  .  Hypertension   . Kidney stones   . Osteopenia   . Permanent atrial fibrillation ([place])    CARDIOLOGIST-- DR [id number]  . Pneumonia "several times"  . Presence of permanent cardiac pacemaker    LAST PACER CHECK  04-23-2013  IN [facility]  . Right ureteral stone   . S/P mitral valve replacement with bioprosthetic valve    04-03-2009  . Schatzki's ring   . Sick sinus syndrome ([place])    S/P  PACEMAKER  2009  . Stroke ([place]) 08/2018  . Stroke ([place])   . Thrombocytopenia, immune ([place])    CHRONIC---   HEMOTOLOGIST---  DR ENNEVER  . Venous insufficiency, peripheral   . Wears glasses    Past Surgical History:  Procedure Laterality Date  . CARDIAC CATHETERIZATION  03/27/2009   normal coronaries; grade IV Mitral Insuff.  Marland Kitchen CARDIAC CATHETERIZATION  2001  &  2006  . CARDIAC VALVE REPLACEMENT    . CATARACT EXTRACTION W/ INTRAOCULAR LENS  IMPLANT, BILATERAL Bilateral ~ 2012  . CYSTOSCOPY W/ URETERAL STENT PLACEMENT Left 08/01/2012   Procedure: CYSTOSCOPY WITH RETROGRADE PYELOGRAM/URETERAL STENT PLACEMENT;  Surgeon: Dutch Gray, MD;  Location: [facility];  Service: Urology;  Laterality: Left;  . CYSTOSCOPY WITH URETEROSCOPY Left 08/14/2012   Procedure: LEFT URETEROSCOPY WITH HOLMIUM LASER AND LEFT  STENT PLACEMENT;  Surgeon: Malka So, MD;  Location: [facility];  Service: Urology;  Laterality: Left;  . CYSTOSCOPY WITH URETEROSCOPY AND STENT PLACEMENT Right 04/25/2013   Procedure: RIGHT URETEROSCOPY WITH  STENT PLACEMENT;  Surgeon: Irine Seal, MD;  [facility]  Service: Urology;  Laterality: Right;  . ESOPHAGOGASTRODUODENOSCOPY (EGD) WITH ESOPHAGEAL DILATION  "a couple times" (01/29/2015)  . EXTRACORPOREAL SHOCK WAVE LITHOTRIPSY Left 07-26-2012  . HAND SURGERY Right 05/2011   "no fracture; straightened things up  . HAND SURGERY Left 2014   "no fracture; straightened things up"  . HOLMIUM LASER APPLICATION Left 04/22/298   Procedure:  HOLMIUM LASER APPLICATION;  Surgeon: Malka So, MD;  Location: [facility];  Service: Urology;  Laterality: Left;  . HOLMIUM LASER APPLICATION Right 11/06/3005   Procedure: HOLMIUM LASER APPLICATION;  Surgeon: Irine Seal, MD;  [facility];  Service: Urology;  Laterality: Right;  . MITRAL VALVE REPLACEMENT WITH CHORDAL PRESEVATION USING BIOPROSTHETIC VALVE/  LIGATION LEFT ATRIAL APPENDAGE/  LEFT-SIDED MAZE PROCEDURE  04-03-2009  DR Ethel Rana,  SERIAL # [id number]  . NM MYOCAR PERF WALL MOTION  03/01/1999   mild lateral  ischemia  . PERMANENT PACEMAKER INSERTION  02/26/2007   Dual Chamber Medtronic Programmed VVIR--- Generator ADDR01/  #[id number]   . PPM GENERATOR CHANGEOUT N/A 08/20/2018   Procedure: PPM GENERATOR CHANGEOUT;  Surgeon: Christina Klein, MD;  Location: [facility];  Service: Cardiovascular;  Laterality: N/A;  . TEE WITHOUT CARDIOVERSION N/A 09/05/2018   Procedure: TRANSESOPHAGEAL ECHOCARDIOGRAM (TEE);  Surgeon: Pixie Casino, MD;  Location: [facility];  Service: Cardiovascular;  Laterality: N/A;  . TRANSTHORACIC ECHOCARDIOGRAM  09-07-2012  DR Kourtni Stineman   MILD LVH/  EF 55-60%/  MILD TO MODERATE AR/   SEVERE LAE  &  RAE/  MILD TO MODERATE TR/   MITRAL VALVE BIOPROSTHESIS LEAFLET NORMAL,  MODERATE STENOSIS WITH NO SIG. REGURG/  MODERATE RVE  . VAGINAL HYSTERECTOMY  1982     No outpatient medications have been marked as taking for the 09/02/19 encounter (Office Visit) with Christina Klein, MD.     Allergies:   Zoloft [sertraline hcl]  Social History   Tobacco Use  . Smoking status: Never Smoker  . Smokeless tobacco: Never Used  . Tobacco comment: never used tobacco  Vaping Use  . Vaping Use: Never used  Substance Use Topics  . Alcohol use: No    Alcohol/week: 0.0 standard drinks  . Drug use: No     Family Hx: The patient's family history includes Cancer in her mother; Heart attack in her father.  ROS:   Please see the history of present illness.    All other systems are reviewed and are negative.  Prior CV studies:   The following studies were reviewed today: Notes from hospitalization in March and device clinic visit in May.  Comprehensive pacemaker check today.  Labs/Other Tests and Data Reviewed:   Echo 03/02/2021L 1. Left ventricular ejection fraction, by estimation, is 50 to 55%. The  left ventricle has low normal function. The left ventricle has no regional  wall motion abnormalities. Left ventricular diastolic function could not  be evaluated. Left ventricular   diastolic function could not be evaluated.  2. Right ventricular systolic function is normal. The right ventricular  size is mildly enlarged. There is moderately elevated pulmonary artery  systolic pressure.  3. Left atrial size was severely dilated.  4. Right atrial size was severely dilated.  5. The mitral valve has been repaired/replaced. No evidence of mitral  valve regurgitation. Mild mitral stenosis. The mean mitral valve gradient  is 7.0 mmHg with average heart rate of 70 bpm. There is a 29 mm Biocor  bioprosthetic valve present in the  mitral position. Procedure Date: 2011. Echo findings are consistent with  normal structure and function of the mitral valve prosthesis.  6. Tricuspid valve regurgitation is moderate.  7. The aortic valve is tricuspid. Aortic valve regurgitation is mild.  Mild aortic valve sclerosis is present, with no evidence of aortic valve  stenosis.  8. There is borderline dilatation of the ascending aorta measuring 37 mm.  9. The inferior vena cava is dilated in size with >50% respiratory  variability, suggesting right atrial pressure of 8 mmHg.   Comparison(s): Changes from prior study are noted. Left ventricular  systolic function is lower, but there are no regional wall motion  abnormalities. There is no change in mitral prosthesis gradients, allowing  for changes in heart rate.   EKG: Ordered today shows atrial fibrillation with controlled ventricular response and occasional ventricular paced beats, left anterior fascicular block on native AV conduction beats  Recent Labs: 04/15/2019: ALT 16 04/18/2019: BUN 17; Creatinine, Ser 0.84; Hemoglobin 11.9; Platelets 66; Potassium 4.1; Sodium 144   Recent Lipid Panel Lab Results  Component Value Date/Time   CHOL 160 11/02/2018 03:27 AM   TRIG 73 11/02/2018 03:27 AM   HDL 63 11/02/2018 03:27 AM   CHOLHDL 2.5 11/02/2018 03:27 AM   LDLCALC 82 11/02/2018 03:27 AM    Wt Readings from Last 3  Encounters:  09/03/19 141 lb (64 kg)  07/09/19 135 lb (61.2 kg)  04/15/19 140 lb (63.5 kg)     Objective:    Vital Signs:  BP 129/68   Pulse (!) 154   Ht 5\' 4"  (1.626 m)   Wt 141 lb (64 kg)   SpO2 96%   BMI 24.20 kg/m     General: Alert, oriented x3, no distress, elderly and frail.  Healthy left subclavian pacemaker site. Head: no evidence of trauma, PERRL, EOMI, no exophtalmos or lid lag, no myxedema, no xanthelasma; normal ears, nose and  oropharynx Neck: Mild elevation in jugular venous pulsations at about 5-6 cm above the sternal angle and mild hepatojugular reflux; brisk carotid pulses without delay and no carotid bruits Chest: clear to auscultation, no signs of consolidation by percussion or palpation, normal fremitus, symmetrical and full respiratory excursions Cardiovascular: normal position and quality of the apical impulse, irregular rhythm, normal first and second heart sounds, 2/6 systolic murmur of aortic sclerosis, no diastolic murmurs, rubs or gallops Abdomen: no tenderness or distention, no masses by palpation, no abnormal pulsatility or arterial bruits, normal bowel sounds, no hepatosplenomegaly Extremities: no clubbing, cyanosis or edema; 2+ radial, ulnar and brachial pulses bilaterally; 2+ right femoral, posterior tibial and dorsalis pedis pulses; 2+ left femoral, posterior tibial and dorsalis pedis pulses; no subclavian or femoral bruits Neurological: grossly nonfocal Psych: Normal mood and affect   ASSESSMENT & PLAN:    1. Permanent atrial fibrillation ([place])   2. Pacemaker   3. History of mitral valve repair   4. Chronic diastolic heart failure ([place])   5. Essential hypertension      1. AFib: Despite not using any AV nodal blocking agents, rate control is good and the percentage of ventricular beats that are paced is steadily increasing, consistent with progression of conduction system disease.  She is on anticoagulation despite her tendency to have falls,  since she had an embolic stroke and documentation of very large left atrial thrombi by TEE last year.  Her son asked if the clots are still there.  I reassured him that any residual clots after so many months of anticoagulation should be firmly a deer and to the wall of the atrium and not likely to embolize.  However she should remain on lifelong anticoagulation barring any true life-threatening bleeding problems.. 2. PM :   I am not sure how to explain the transient worsening of the ventricular pacing threshold in April and March of this year, but RV pacing capture threshold has returned to the previous baseline.  Reprogrammed today with a fixed output at twice the capture threshold.  Recheck in the office every 6 months, with interval 38-month remote downloads. 3. S/P bioMVR: moderately elevated gradients, otherwise normal prosthetic valve function at last check.  She is not a good candidate for redo surgery.  She is quite sedentary and will hopefully not have a lot of symptoms from the mitral stenosis. 4. CHF: She's steadily lost weight throughout the year.  It is very difficult to decide what her "dry weight "is anymore.  Today she appears to have some subtle findings of hypervolemia with jugular venous distention, but she does not have any leg edema.  Would use her "as needed" diuretic for weight over 140 pounds, as initial estimation of her new target weight. 5. HTN: good blood pressure control.   Medication Adjustments/Labs and Tests Ordered: Current medicines are reviewed at length with the patient today.  Concerns regarding medicines are outlined above.   Patient Instructions  Medication Instructions:  No changes *If you need a refill on your cardiac medications before your next appointment, please call your pharmacy*   Lab Work: None ordered If you have labs (blood work) drawn today and your tests are completely normal, you will receive your results only by: Marland Kitchen MyChart Message (if you have  MyChart) OR . A paper copy in the mail If you have any lab test that is abnormal or we need to change your treatment, we will call you to review the results.   Testing/Procedures: None  ordered   Follow-Up: At [facility], you and your health needs are our priority.  As part of our continuing mission to provide you with exceptional heart care, we have created designated Provider Care Teams.  These Care Teams include your primary Cardiologist (physician) and Advanced Practice Providers (APPs -  Physician Assistants and Nurse Practitioners) who all work together to provide you with the care you need, when you need it.  We recommend signing up for the patient portal called "MyChart".  Sign up information is provided on this After Visit Summary.  MyChart is used to connect with patients for Virtual Visits (Telemedicine).  Patients are able to view lab/test results, encounter notes, upcoming appointments, etc.  Non-urgent messages can be sent to your provider as well.   To learn more about what you can do with MyChart, go to [email or web address].    Your next appointment:   6 month(s)  The format for your next appointment:   In Person  Provider:   Sanda Klein, MD     Signed, Christina Klein, MD  09/03/2019 8:42 AM    [place]

## 2019-09-03 ENCOUNTER — Encounter: Payer: Self-pay | Admitting: Cardiovascular Disease

## 2019-09-18 DIAGNOSIS — L84 Corns and callosities: Secondary | ICD-10-CM | POA: Diagnosis not present

## 2019-09-18 DIAGNOSIS — I739 Peripheral vascular disease, unspecified: Secondary | ICD-10-CM | POA: Diagnosis not present

## 2019-09-18 DIAGNOSIS — L602 Onychogryphosis: Secondary | ICD-10-CM | POA: Diagnosis not present

## 2019-11-18 DIAGNOSIS — Z7189 Other specified counseling: Secondary | ICD-10-CM | POA: Diagnosis not present

## 2019-11-18 DIAGNOSIS — N3020 Other chronic cystitis without hematuria: Secondary | ICD-10-CM | POA: Diagnosis not present

## 2019-11-18 DIAGNOSIS — N200 Calculus of kidney: Secondary | ICD-10-CM | POA: Diagnosis not present

## 2019-11-28 ENCOUNTER — Ambulatory Visit (INDEPENDENT_AMBULATORY_CARE_PROVIDER_SITE_OTHER): Payer: Medicare PPO

## 2019-11-28 DIAGNOSIS — I495 Sick sinus syndrome: Secondary | ICD-10-CM

## 2019-11-28 LAB — CUP PACEART REMOTE DEVICE CHECK
Battery Remaining Longevity: 137 mo
Battery Voltage: 3.03 V
Brady Statistic AP VP Percent: 0 %
Brady Statistic AP VS Percent: 0 %
Brady Statistic AS VP Percent: 84 %
Brady Statistic AS VS Percent: 16 %
Brady Statistic RA Percent Paced: 0 %
Brady Statistic RV Percent Paced: 84 %
Date Time Interrogation Session: 20211014032359
Lead Channel Impedance Value: 323 Ohm
Lead Channel Impedance Value: 342 Ohm
Lead Channel Impedance Value: 399 Ohm
Lead Channel Impedance Value: 513 Ohm
Lead Channel Pacing Threshold Amplitude: 0.625 V
Lead Channel Pacing Threshold Pulse Width: 0.4 ms
Lead Channel Sensing Intrinsic Amplitude: 0.875 mV
Lead Channel Sensing Intrinsic Amplitude: 2.875 mV
Lead Channel Sensing Intrinsic Amplitude: 2.875 mV
Lead Channel Setting Pacing Amplitude: 2 V
Lead Channel Setting Pacing Pulse Width: 0.6 ms
Lead Channel Setting Sensing Sensitivity: 1.2 mV

## 2019-11-29 ENCOUNTER — Telehealth: Payer: Self-pay | Admitting: *Deleted

## 2019-11-29 DIAGNOSIS — I4821 Permanent atrial fibrillation: Secondary | ICD-10-CM

## 2019-11-29 DIAGNOSIS — D509 Iron deficiency anemia, unspecified: Secondary | ICD-10-CM

## 2019-11-29 NOTE — Telephone Encounter (Signed)
-----   Message from Sanda Klein, MD sent at 11/28/2019 10:05 AM EDT ----- Remote reviewed.   Not pacemaker dependent. Battery status is good. Lead measurements are stable. Heart rate histogram is favorable. Permanent atrial fibrillation with slow ventricular rates. There are several episodes of high ventricular rate, likely NSVT.  Please schedule for BMET, Mg and also check CBC and iron studies for iron deficiency anemia. Start metoprolol succinate 25 mg daily and schedule the next device check in the office in 3 months please. If BP gets too low w metoprolol, we can cut back on the olmesartan.

## 2019-11-29 NOTE — Telephone Encounter (Signed)
Left a message for the patient to call back.  

## 2019-12-03 NOTE — Progress Notes (Signed)
Remote pacemaker transmission.   

## 2019-12-05 MED ORDER — METOPROLOL SUCCINATE ER 25 MG PO TB24: 25.0000 mg | ORAL_TABLET | Freq: Every day | ORAL | 3 refills | Status: DC

## 2019-12-05 NOTE — Telephone Encounter (Signed)
Spoke with the patient's daughter per the dpr. She has verbalized her understanding. She will bring the patient in tomorrow for lab work.   Metoprolol Succinate 25 mg once daily has been sent in for her.   Message sent to scheduling for the appointment.

## 2019-12-06 DIAGNOSIS — I4821 Permanent atrial fibrillation: Secondary | ICD-10-CM | POA: Diagnosis not present

## 2019-12-06 DIAGNOSIS — D509 Iron deficiency anemia, unspecified: Secondary | ICD-10-CM | POA: Diagnosis not present

## 2019-12-07 LAB — BASIC METABOLIC PANEL
BUN/Creatinine Ratio: 21 (ref 12–28)
BUN: 21 mg/dL (ref 8–27)
CO2: 26 mmol/L (ref 20–29)
Calcium: 10.5 mg/dL — ABNORMAL HIGH (ref 8.7–10.3)
Chloride: 101 mmol/L (ref 96–106)
Creatinine, Ser: 0.99 mg/dL (ref 0.57–1.00)
GFR calc Af Amer: 59 mL/min/{1.73_m2} — ABNORMAL LOW (ref >59)
GFR calc non Af Amer: 51 mL/min/{1.73_m2} — ABNORMAL LOW (ref >59)
Glucose: 78 mg/dL (ref 65–99)
Potassium: 4.8 mmol/L (ref 3.5–5.2)
Sodium: 138 mmol/L (ref 134–144)

## 2019-12-07 LAB — CBC
Hematocrit: 39 % (ref 34.0–46.6)
Hemoglobin: 12.7 g/dL (ref 11.1–15.9)
MCH: 30.8 pg (ref 26.6–33.0)
MCHC: 32.6 g/dL (ref 31.5–35.7)
MCV: 95 fL (ref 79–97)
Platelets: 102 10*3/uL — ABNORMAL LOW (ref 150–450)
RBC: 4.12 x10E6/uL (ref 3.77–5.28)
RDW: 12.3 % (ref 11.7–15.4)
WBC: 4.7 10*3/uL (ref 3.4–10.8)

## 2019-12-07 LAB — IRON,TIBC AND FERRITIN PANEL
Ferritin: 70 ng/mL (ref 15–150)
Iron Saturation: 28 % (ref 15–55)
Iron: 109 ug/dL (ref 27–139)
Total Iron Binding Capacity: 384 ug/dL (ref 250–450)
UIBC: 275 ug/dL (ref 118–369)

## 2019-12-07 LAB — MAGNESIUM: Magnesium: 2.1 mg/dL (ref 1.6–2.3)

## 2019-12-09 ENCOUNTER — Other Ambulatory Visit: Payer: Self-pay

## 2019-12-16 ENCOUNTER — Other Ambulatory Visit: Payer: Self-pay | Admitting: Cardiovascular Disease

## 2020-01-01 DIAGNOSIS — I739 Peripheral vascular disease, unspecified: Secondary | ICD-10-CM | POA: Diagnosis not present

## 2020-01-01 DIAGNOSIS — L602 Onychogryphosis: Secondary | ICD-10-CM | POA: Diagnosis not present

## 2020-01-01 DIAGNOSIS — L84 Corns and callosities: Secondary | ICD-10-CM | POA: Diagnosis not present

## 2020-01-22 ENCOUNTER — Other Ambulatory Visit: Payer: Self-pay | Admitting: Cardiovascular Disease

## 2020-01-24 ENCOUNTER — Telehealth: Payer: Self-pay | Admitting: Cardiovascular Disease

## 2020-01-24 NOTE — Telephone Encounter (Signed)
Left message for pt to call back  °

## 2020-01-24 NOTE — Telephone Encounter (Signed)
Spoke to patient's son Jeneen Rinks Dr.Croitoru's advice given.

## 2020-01-24 NOTE — Telephone Encounter (Signed)
Agree with increasing the diuretics, but it may take less than 3 days for her to get back to baseline.  (She is a tiny lady).  Okay to cut back to the baseline furosemide dose as soon as her weight is 140 pounds or lower.  Thank you

## 2020-01-24 NOTE — Telephone Encounter (Signed)
Pt c/o swelling: STAT is pt has developed SOB within 24 hours  1) How much weight have you gained and in what time span? 3 pounds in 1 day   2) If swelling, where is the swelling located?  No swelling.  3) Are you currently taking a fluid pill? Yes  4) Are you currently SOB? No  5) Do you have a log of your daily weights (if so, list)?  01/20/2020 - 140.2 01/21/2020- 140 01/22/2020- 139.4 01/23/2020 - 140.2 01/24/2020 - 143  6) Have you gained 3 pounds in a day or 5 pounds in a week?  Yes  7) Have you traveled recently? No

## 2020-01-24 NOTE — Telephone Encounter (Signed)
Patient's son is returning call. He states if he is not able to answer his cell phone, please call the home number, [phone].

## 2020-01-24 NOTE — Telephone Encounter (Signed)
Spoke to patient's son [name] he stated mother gained 3 lbs over night.No sob.No swelling.Advised to take Lasix 40 mg daily for the next 3 days then resume 40 mg as needed.I will send message to Dr.Croitoru for review.

## 2020-02-02 ENCOUNTER — Other Ambulatory Visit: Payer: Self-pay | Admitting: Cardiovascular Disease

## 2020-02-27 ENCOUNTER — Ambulatory Visit (INDEPENDENT_AMBULATORY_CARE_PROVIDER_SITE_OTHER): Payer: Medicare PPO

## 2020-02-27 DIAGNOSIS — I4891 Unspecified atrial fibrillation: Secondary | ICD-10-CM | POA: Diagnosis not present

## 2020-02-27 LAB — CUP PACEART REMOTE DEVICE CHECK
Battery Remaining Longevity: 133 mo
Battery Voltage: 3.02 V
Brady Statistic AP VP Percent: 0 %
Brady Statistic AP VS Percent: 0 %
Brady Statistic AS VP Percent: 94.08 %
Brady Statistic AS VS Percent: 5.92 %
Brady Statistic RA Percent Paced: 0 %
Brady Statistic RV Percent Paced: 94.08 %
Date Time Interrogation Session: 20220113005350
Lead Channel Impedance Value: 342 Ohm
Lead Channel Impedance Value: 342 Ohm
Lead Channel Impedance Value: 418 Ohm
Lead Channel Impedance Value: 513 Ohm
Lead Channel Pacing Threshold Amplitude: 0.875 V
Lead Channel Pacing Threshold Pulse Width: 0.4 ms
Lead Channel Sensing Intrinsic Amplitude: 0.875 mV
Lead Channel Sensing Intrinsic Amplitude: 3.625 mV
Lead Channel Sensing Intrinsic Amplitude: 3.625 mV
Lead Channel Setting Pacing Amplitude: 2 V
Lead Channel Setting Pacing Pulse Width: 0.6 ms
Lead Channel Setting Sensing Sensitivity: 1.2 mV

## 2020-02-28 ENCOUNTER — Encounter: Payer: Self-pay | Admitting: Cardiovascular Disease

## 2020-02-28 ENCOUNTER — Ambulatory Visit (INDEPENDENT_AMBULATORY_CARE_PROVIDER_SITE_OTHER): Payer: Medicare PPO | Admitting: Cardiovascular Disease

## 2020-02-28 ENCOUNTER — Other Ambulatory Visit: Payer: Self-pay

## 2020-02-28 VITALS — BP 145/80 | HR 77 | Ht 63.0 in | Wt 146.8 lb

## 2020-02-28 DIAGNOSIS — I10 Essential (primary) hypertension: Secondary | ICD-10-CM | POA: Diagnosis not present

## 2020-02-28 DIAGNOSIS — I4821 Permanent atrial fibrillation: Secondary | ICD-10-CM

## 2020-02-28 DIAGNOSIS — Z953 Presence of xenogenic heart valve: Secondary | ICD-10-CM

## 2020-02-28 DIAGNOSIS — Z7901 Long term (current) use of anticoagulants: Secondary | ICD-10-CM | POA: Diagnosis not present

## 2020-02-28 DIAGNOSIS — Z950 Presence of cardiac pacemaker: Secondary | ICD-10-CM | POA: Diagnosis not present

## 2020-02-28 DIAGNOSIS — I5032 Chronic diastolic (congestive) heart failure: Secondary | ICD-10-CM | POA: Diagnosis not present

## 2020-02-28 DIAGNOSIS — I472 Ventricular tachycardia: Secondary | ICD-10-CM

## 2020-02-28 DIAGNOSIS — I4729 Other ventricular tachycardia: Secondary | ICD-10-CM

## 2020-02-28 MED ORDER — FUROSEMIDE 40 MG PO TABS
ORAL_TABLET | ORAL | 5 refills | Status: DC
Start: 2020-02-28 — End: 2020-10-12

## 2020-02-28 NOTE — Patient Instructions (Addendum)
Medication Instructions:  Furosemide: Take one 40 mg tablet daily as needed for a weight over 143 pounds  *If you need a refill on your cardiac medications before your next appointment, please call your pharmacy*   Lab Work: None ordered If you have labs (blood work) drawn today and your tests are completely normal, you will receive your results only by: Marland Kitchen MyChart Message (if you have MyChart) OR . A paper copy in the mail If you have any lab test that is abnormal or we need to change your treatment, we will call you to review the results.   Testing/Procedures: None ordered   Follow-Up: At [facility], you and your health needs are our priority.  As part of our continuing mission to provide you with exceptional heart care, we have created designated Provider Care Teams.  These Care Teams include your primary Cardiologist (physician) and Advanced Practice Providers (APPs -  Physician Assistants and Nurse Practitioners) who all work together to provide you with the care you need, when you need it.  We recommend signing up for the patient portal called "MyChart".  Sign up information is provided on this After Visit Summary.  MyChart is used to connect with patients for Virtual Visits (Telemedicine).  Patients are able to view lab/test results, encounter notes, upcoming appointments, etc.  Non-urgent messages can be sent to your provider as well.   To learn more about what you can do with MyChart, go to [email or web address].    Your next appointment:   6 month(s)  The format for your next appointment:   In Person  Provider:   You may see Sanda Klein, MD or one of the following Advanced Practice Providers on your designated Care Team:    Almyra Deforest, PA-C  Fabian Sharp, PA-C or   Roby Lofts, [place]

## 2020-02-28 NOTE — Progress Notes (Signed)
Cardiology Office Note   Date:  03/01/2020   ID:  [name], DOB Dec 01, 1929, MRN [id number]  PCP:  Lawerance Cruel, MD  Cardiologist:  Sanda Klein, MD  Electrophysiologist:  None   Evaluation Performed:  Follow-Up Visit  Chief Complaint:  Pacemaker; atrial fibrillation  History of Present Illness:    [name] is a 87 y.o. female with with long-standing atrial fibrillation with slow ventricular response and permanent pacemaker (recent generator change July 2020, Medtronic), systemic hypertension and hyperlipidemia, chronic diastolic heart failure and a biological mitral valve prosthesis (2011, 29 mm Biocor, Dr. Prescott Gum, with left atrial appendage ligation) with moderate prosthetic valve stenosis (mean gradient 9 mmHg at 75 bpm (.  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  She remains elderly and frail, but is holding her own had any serious complications hospitalizations or falls in the last year.  She denies dizziness or syncope, is unaware of palpitations, does not have lower extremity edema, orthopnea or PND.  She has never complained of angina pectoris.  She has not had any new neurologic events.  Her pacemaker documents a very stable activity level of 2.0-2.5 hours a day over the last 9 months or so.  Her family monitors her weight and vital signs carefully.  She typically weighs 138-143 pounds on her home scale (about 4 pounds office scale).  Her blood pressure at home is typically 110s/60s.  Pacemaker interrogation shows normal device function.  Her dual-chamber pacemaker is programmed VVIR and has an estimated longevity of about 11 years.  She is not pacemaker dependent, but she has ventricular paced rhythm 94% of the time.  As in the past, she has had moderate number of episodes of high ventricular rate, with a regular  rhythm, sometimes with a decrescendo pattern that may represent nonsustained VT.  None of these have been symptomatic.  The overall prevalence of these episodes of possible ventricular tachycardia has not really changed since her last appointment (roughly 3-4 times a month).    Past Medical History:  Diagnosis Date  . Anxiety   . Arthritis    "mostly my hands" (01/29/2015)  . Basal cell carcinoma of forehead   . Benign essential tremor   . CAP (community acquired pneumonia) 01/29/2015  . CHF (congestive heart failure) ([place])   . Dementia ([place]) 10/18/2018  . Depression   . Edema of both legs 04-23-2013  pt states feet swelling (wears ted hose)   bilateral venous ablation procedures done in IR, L>R  . GERD (gastroesophageal reflux disease)   . H/O hiatal hernia   . Hyperlipidemia   . Hyperparathyroidism ([place])    MILD--  STABLE  . Hypertension   . Kidney stones   . Osteopenia   . Permanent atrial fibrillation ([place])    CARDIOLOGIST-- DR [id number]  . Pneumonia "several times"  . Presence of permanent cardiac pacemaker    LAST PACER CHECK  04-23-2013  IN [facility]  . Right ureteral stone   . S/P mitral valve replacement with bioprosthetic valve    04-03-2009  . Schatzki's ring   . Sick sinus syndrome ([place])    S/P  PACEMAKER  2009  . Stroke ([place]) 08/2018  . Stroke ([place])   . Thrombocytopenia, immune ([place])    CHRONIC---  HEMOTOLOGIST---  DR ENNEVER  . Venous insufficiency, peripheral   .  Wears glasses    Past Surgical History:  Procedure Laterality Date  . CARDIAC CATHETERIZATION  03/27/2009   normal coronaries; grade IV Mitral Insuff.  Marland Kitchen CARDIAC CATHETERIZATION  2001  &  2006  . CARDIAC VALVE REPLACEMENT    . CATARACT EXTRACTION W/ INTRAOCULAR LENS  IMPLANT, BILATERAL Bilateral ~ 2012  . CYSTOSCOPY W/ URETERAL STENT PLACEMENT Left 08/01/2012   Procedure: CYSTOSCOPY WITH RETROGRADE PYELOGRAM/URETERAL STENT PLACEMENT;  Surgeon: Dutch Gray, MD;  Location: [facility];  Service: Urology;   Laterality: Left;  . CYSTOSCOPY WITH URETEROSCOPY Left 08/14/2012   Procedure: LEFT URETEROSCOPY WITH HOLMIUM LASER AND LEFT  STENT PLACEMENT;  Surgeon: Malka So, MD;  Location: [facility];  Service: Urology;  Laterality: Left;  . CYSTOSCOPY WITH URETEROSCOPY AND STENT PLACEMENT Right 04/25/2013   Procedure: RIGHT URETEROSCOPY WITH  STENT PLACEMENT;  Surgeon: Irine Seal, MD;  Location[facility];  Service: Urology;  Laterality: Right;  . ESOPHAGOGASTRODUODENOSCOPY (EGD) WITH ESOPHAGEAL DILATION  "a couple times" (01/29/2015)  . EXTRACORPOREAL SHOCK WAVE LITHOTRIPSY Left 07-26-2012  . HAND SURGERY Right 05/2011   "no fracture; straightened things up  . HAND SURGERY Left 2014   "no fracture; straightened things up"  . HOLMIUM LASER APPLICATION Left [id number]   Procedure:  HOLMIUM LASER APPLICATION;  Surgeon: Malka So, MD;  Location: [facility];  Service: Urology;  Laterality: Left;  . HOLMIUM LASER APPLICATION Right [id number]   Procedure: HOLMIUM LASER APPLICATION;  Surgeon: Irine Seal, MD;  Location[facility];  Service: Urology;  Laterality: Right;  . MITRAL VALVE REPLACEMENT WITH CHORDAL PRESEVATION USING BIOPROSTHETIC VALVE/  LIGATION LEFT ATRIAL APPENDAGE/  LEFT-SIDED MAZE PROCEDURE  04-03-2009  DR Ethel Rana,  SERIAL # [id number]  . NM MYOCAR PERF WALL MOTION  03/01/1999   mild lateral ischemia  . PERMANENT PACEMAKER INSERTION  02/26/2007   Dual Chamber Medtronic Programmed VVIR--- Generator [id number]/  [id number]   . PPM GENERATOR CHANGEOUT N/A 08/20/2018   Procedure: PPM GENERATOR CHANGEOUT;  Surgeon: Sanda Klein, MD;  Location: [facility];  Service: Cardiovascular;  Laterality: N/A;  . TEE WITHOUT CARDIOVERSION N/A 09/05/2018   Procedure: TRANSESOPHAGEAL ECHOCARDIOGRAM (TEE);  Surgeon: Pixie Casino, MD;  Location[facility];  Service: Cardiovascular;  Laterality: N/A;  . TRANSTHORACIC ECHOCARDIOGRAM  09-07-2012  DR Marishka Rentfrow   MILD  LVH/  EF 55-60%/  MILD TO MODERATE AR/   SEVERE LAE  &  RAE/  MILD TO MODERATE TR/   MITRAL VALVE BIOPROSTHESIS LEAFLET NORMAL,  MODERATE STENOSIS WITH NO SIG. REGURG/  MODERATE RVE  . VAGINAL HYSTERECTOMY  1982     Current Meds  Medication Sig  . acetaminophen (TYLENOL) 500 MG tablet Take 500-1,000 mg by mouth daily as needed for moderate pain or headache.   . ALPRAZolam (XANAX) 0.5 MG tablet Take 0.5-1 tablets (0.25-0.5 mg total) by mouth See admin instructions. Take 1/2 tablet every morning and take 1 tablet at night  . Ascorbic Acid (VITAMIN C) 1000 MG tablet Take 500 mg by mouth daily.   Marland Kitchen b complex vitamins capsule Take 1 capsule by mouth daily.  . cephALEXin (KEFLEX) 500 MG capsule Take 500 mg by mouth daily. For five days  . Cholecalciferol (VITAMIN D) 50 MCG (2000 UT) tablet Take 2,000 Units by mouth daily.  . CVS VITAMIN E 1000 units capsule Take 450 capsules by mouth daily.  Marland Kitchen donepezil (ARICEPT) 5 MG tablet Take 5 mg by mouth at bedtime.  Marland Kitchen  ELIQUIS 5 MG TABS tablet Take 5 mg by mouth 2 (two) times daily.  . Melatonin 10 MG TABS Take 1 tablet by mouth at bedtime.  . metoprolol succinate (TOPROL-XL) 25 MG 24 hr tablet TAKE 1 TABLET BY MOUTH EVERY DAY  . nitrofurantoin, macrocrystal-monohydrate, (MACROBID) 100 MG capsule Take 100 mg by mouth daily. For 7 days only  . Nutritional Supplements (IMMUNE ENHANCE) TABS Take 1 tablet by mouth daily.  Marland Kitchen olmesartan (BENICAR) 20 MG tablet Take 1 tablet (20 mg total) by mouth daily.  Marland Kitchen omeprazole (PRILOSEC) 20 MG capsule Take 20 mg by mouth daily.  Marland Kitchen OVER THE COUNTER MEDICATION Take 1 tablet by mouth 2 (two) times daily. Nutrifuron (immune supplement)  . potassium chloride (KLOR-CON) 8 MEQ tablet TAKE 3 TABLETS (24 MEQ) WHEN YOU TAKE THE AS NEEDED FUROSEMIDE.  Marland Kitchen Probiotic Product (PROBIOTIC-10 ULTIMATE PO) Take 1 capsule by mouth daily.  . simvastatin (ZOCOR) 10 MG tablet TAKE 1 TABLET BY MOUTH EVERY DAY AT DINNER (Patient taking differently:  Take 10 mg by mouth at bedtime.)  . Vit B6-Vit B12-Omega 3 Acids (VITAMIN B PLUS+ PO) Take 1 tablet by mouth daily.  . [DISCONTINUED] furosemide (LASIX) 40 MG tablet Take 40 mg by mouth daily as needed for fluid.     Allergies:   Zoloft [sertraline hcl]   Social History   Tobacco Use  . Smoking status: Never Smoker  . Smokeless tobacco: Never Used  . Tobacco comment: never used tobacco  Vaping Use  . Vaping Use: Never used  Substance Use Topics  . Alcohol use: No    Alcohol/week: 0.0 standard drinks  . Drug use: No     Family Hx: The patient's family history includes Cancer in her mother; Heart attack in her father.  ROS:   Please see the history of present illness.    All other systems are reviewed and are negative.  Prior CV studies:   The following studies were reviewed today: Notes from hospitalization in March and device clinic visit in May.  Comprehensive pacemaker check today.  Labs/Other Tests and Data Reviewed:   Echo 03/02/2021L 1. Left ventricular ejection fraction, by estimation, is 50 to 55%. The  left ventricle has low normal function. The left ventricle has no regional  wall motion abnormalities. Left ventricular diastolic function could not  be evaluated. Left ventricular  diastolic function could not be evaluated.  2. Right ventricular systolic function is normal. The right ventricular  size is mildly enlarged. There is moderately elevated pulmonary artery  systolic pressure.  3. Left atrial size was severely dilated.  4. Right atrial size was severely dilated.  5. The mitral valve has been repaired/replaced. No evidence of mitral  valve regurgitation. Mild mitral stenosis. The mean mitral valve gradient  is 7.0 mmHg with average heart rate of 70 bpm. There is a 29 mm Biocor  bioprosthetic valve present in the  mitral position. Procedure Date: 2011. Echo findings are consistent with  normal structure and function of the mitral valve prosthesis.   6. Tricuspid valve regurgitation is moderate.  7. The aortic valve is tricuspid. Aortic valve regurgitation is mild.  Mild aortic valve sclerosis is present, with no evidence of aortic valve  stenosis.  8. There is borderline dilatation of the ascending aorta measuring 37 mm.  9. The inferior vena cava is dilated in size with >50% respiratory  variability, suggesting right atrial pressure of 8 mmHg.   Comparison(s): Changes from prior study are noted. Left ventricular  systolic function is lower, but there are no regional wall motion  abnormalities. There is no change in mitral prosthesis gradients, allowing  for changes in heart rate.   EKG: Ordered today shows atrial fibrillation with ventricular paced rhythm  Recent Labs: 04/15/2019: ALT 16 12/06/2019: BUN 21; Creatinine, Ser 0.99; Hemoglobin 12.7; Magnesium 2.1; Platelets 102; Potassium 4.8; Sodium 138   Recent Lipid Panel Lab Results  Component Value Date/Time   CHOL 160 11/02/2018 03:27 AM   TRIG 73 11/02/2018 03:27 AM   HDL 63 11/02/2018 03:27 AM   CHOLHDL 2.5 11/02/2018 03:27 AM   LDLCALC 82 11/02/2018 03:27 AM    Wt Readings from Last 3 Encounters:  02/28/20 146 lb 12.8 oz (66.6 kg)  09/03/19 141 lb (64 kg)  07/09/19 135 lb (61.2 kg)     Objective:    Vital Signs:  BP (!) 145/80   Pulse 77   Ht 5\' 3"  (1.6 m)   Wt 146 lb 12.8 oz (66.6 kg)   SpO2 98%   BMI 26.00 kg/m     General: Alert, oriented x3, no distress, appears elderly and frail Head: no evidence of trauma, PERRL, EOMI, no exophtalmos or lid lag, no myxedema, no xanthelasma; normal ears, nose and oropharynx Neck: 6-8 cm elevation in jugular venous pulsations and prompt hepatojugular reflux; brisk carotid pulses without delay and no carotid bruits Chest: clear to auscultation, no signs of consolidation by percussion or palpation, normal fremitus, symmetrical and full respiratory excursions Cardiovascular: normal position and quality of the apical  impulse, regular rhythm, normal first and paradoxically split second heart sounds, 2/6 aortic ejection murmur is early peaking no diastolic murmurs, rubs or gallops Abdomen: no tenderness or distention, no masses by palpation, no abnormal pulsatility or arterial bruits, normal bowel sounds, no hepatosplenomegaly Extremities: no clubbing, cyanosis or edema; 2+ radial, ulnar and brachial pulses bilaterally; 2+ right femoral, posterior tibial and dorsalis pedis pulses; 2+ left femoral, posterior tibial and dorsalis pedis pulses; no subclavian or femoral bruits Neurological: grossly nonfocal Psych: Normal mood and affect  ASSESSMENT & PLAN:    1. Permanent atrial fibrillation ([place])   2. Long term (current) use of anticoagulants   3. Pacemaker   4. NSVT (nonsustained ventricular tachycardia) ([place])   5. S/P mitral valve replacement with bioprosthetic valve   6. Chronic diastolic heart failure ([place])   7. Essential hypertension      1. AFib: Rate control is good on a very low-dose of beta-blocker.  This is prescribed for her ventricular arrhythmia, rather than for A. fib rate control.  Despite the fact that she has had left atrial appendage clipping, she is on anticoagulation, since she had an embolic stroke and documentation of very large left atrial thrombi by TEE.   2. Anticoagulation: She has not had recent falls.  Barring any truly severe bleeding complications, she should remain on lifelong anticoagulation barring any true life-threatening bleeding problems. 3. PM :   Previous problems with threshold have not recurred.  Normal device function.  Continue remote downloads every 3 months. 4. NSVT: Some of the episodes are relatively lengthy, but the ventricular rate is never fast and the episodes are asymptomatic.  On beta-blocker.  No changes recommended. 5. S/P bioMVR: moderately elevated gradients, otherwise normal prosthetic valve function at last check.  She is not a good candidate for redo  surgery.  She is quite sedentary and will hopefully not have a lot of symptoms from the mitral stenosis.  We are successfully avoiding  problems with sustained tachycardia that would worsen filling pressures. 6. CHF: She has borderline signs of hypervolemia by physical exam today.  I think she has lost a little bit of true weight and I think her current weight (143 pounds on her home scale) should probably trigger intervention with a diuretic, generally trying to keep her weight 140 pounds or less. 7. HTN: Borderline high in the office today, but consistently well controlled at home.   Medication Adjustments/Labs and Tests Ordered: Current medicines are reviewed at length with the patient today.  Concerns regarding medicines are outlined above.   Patient Instructions  Medication Instructions:  Furosemide: Take one 40 mg tablet daily as needed for a weight over 143 pounds  *If you need a refill on your cardiac medications before your next appointment, please call your pharmacy*   Lab Work: None ordered If you have labs (blood work) drawn today and your tests are completely normal, you will receive your results only by: Marland Kitchen MyChart Message (if you have MyChart) OR . A paper copy in the mail If you have any lab test that is abnormal or we need to change your treatment, we will call you to review the results.   Testing/Procedures: None ordered   Follow-Up: At [facility], you and your health needs are our priority.  As part of our continuing mission to provide you with exceptional heart care, we have created designated Provider Care Teams.  These Care Teams include your primary Cardiologist (physician) and Advanced Practice Providers (APPs -  Physician Assistants and Nurse Practitioners) who all work together to provide you with the care you need, when you need it.  We recommend signing up for the patient portal called "MyChart".  Sign up information is provided on this After Visit Summary.   MyChart is used to connect with patients for Virtual Visits (Telemedicine).  Patients are able to view lab/test results, encounter notes, upcoming appointments, etc.  Non-urgent messages can be sent to your provider as well.   To learn more about what you can do with MyChart, go to [email or web address].    Your next appointment:   6 month(s)  The format for your next appointment:   In Person  Provider:   You may see Sanda Klein, MD or one of the following Advanced Practice Providers on your designated Care Team:    Almyra Deforest, PA-C  Fabian Sharp, [place] or   Roby Lofts, PA-C     Signed, Sanda Klein, MD  03/01/2020 12:20 PM    [place]

## 2020-03-01 ENCOUNTER — Encounter: Payer: Self-pay | Admitting: Cardiovascular Disease

## 2020-03-11 NOTE — Progress Notes (Signed)
Remote pacemaker transmission.   

## 2020-03-18 DIAGNOSIS — L821 Other seborrheic keratosis: Secondary | ICD-10-CM | POA: Diagnosis not present

## 2020-03-18 DIAGNOSIS — Z85828 Personal history of other malignant neoplasm of skin: Secondary | ICD-10-CM | POA: Diagnosis not present

## 2020-03-18 DIAGNOSIS — L905 Scar conditions and fibrosis of skin: Secondary | ICD-10-CM | POA: Diagnosis not present

## 2020-03-18 DIAGNOSIS — C44319 Basal cell carcinoma of skin of other parts of face: Secondary | ICD-10-CM | POA: Diagnosis not present

## 2020-03-18 DIAGNOSIS — D485 Neoplasm of uncertain behavior of skin: Secondary | ICD-10-CM | POA: Diagnosis not present

## 2020-03-18 DIAGNOSIS — L570 Actinic keratosis: Secondary | ICD-10-CM | POA: Diagnosis not present

## 2020-03-25 ENCOUNTER — Other Ambulatory Visit: Payer: Self-pay | Admitting: Cardiovascular Disease

## 2020-03-25 DIAGNOSIS — R350 Frequency of micturition: Secondary | ICD-10-CM | POA: Diagnosis not present

## 2020-03-25 DIAGNOSIS — F419 Anxiety disorder, unspecified: Secondary | ICD-10-CM | POA: Diagnosis not present

## 2020-03-25 DIAGNOSIS — E210 Primary hyperparathyroidism: Secondary | ICD-10-CM | POA: Diagnosis not present

## 2020-03-25 DIAGNOSIS — D696 Thrombocytopenia, unspecified: Secondary | ICD-10-CM | POA: Diagnosis not present

## 2020-03-25 DIAGNOSIS — E7800 Pure hypercholesterolemia, unspecified: Secondary | ICD-10-CM | POA: Diagnosis not present

## 2020-03-25 DIAGNOSIS — I10 Essential (primary) hypertension: Secondary | ICD-10-CM | POA: Diagnosis not present

## 2020-03-25 DIAGNOSIS — R300 Dysuria: Secondary | ICD-10-CM | POA: Diagnosis not present

## 2020-03-25 DIAGNOSIS — E559 Vitamin D deficiency, unspecified: Secondary | ICD-10-CM | POA: Diagnosis not present

## 2020-03-25 DIAGNOSIS — R6889 Other general symptoms and signs: Secondary | ICD-10-CM | POA: Diagnosis not present

## 2020-03-25 DIAGNOSIS — R2689 Other abnormalities of gait and mobility: Secondary | ICD-10-CM | POA: Diagnosis not present

## 2020-04-02 DIAGNOSIS — L602 Onychogryphosis: Secondary | ICD-10-CM | POA: Diagnosis not present

## 2020-04-02 DIAGNOSIS — I739 Peripheral vascular disease, unspecified: Secondary | ICD-10-CM | POA: Diagnosis not present

## 2020-04-02 DIAGNOSIS — I872 Venous insufficiency (chronic) (peripheral): Secondary | ICD-10-CM | POA: Diagnosis not present

## 2020-04-13 DIAGNOSIS — N3020 Other chronic cystitis without hematuria: Secondary | ICD-10-CM | POA: Diagnosis not present

## 2020-04-13 DIAGNOSIS — R300 Dysuria: Secondary | ICD-10-CM | POA: Diagnosis not present

## 2020-04-15 ENCOUNTER — Other Ambulatory Visit: Payer: Self-pay | Admitting: Internal Medicine

## 2020-04-15 DIAGNOSIS — R9389 Abnormal findings on diagnostic imaging of other specified body structures: Secondary | ICD-10-CM | POA: Diagnosis not present

## 2020-04-15 DIAGNOSIS — Z8673 Personal history of transient ischemic attack (TIA), and cerebral infarction without residual deficits: Secondary | ICD-10-CM | POA: Diagnosis not present

## 2020-04-15 DIAGNOSIS — R32 Unspecified urinary incontinence: Secondary | ICD-10-CM | POA: Diagnosis not present

## 2020-04-15 DIAGNOSIS — E210 Primary hyperparathyroidism: Secondary | ICD-10-CM | POA: Diagnosis not present

## 2020-04-15 DIAGNOSIS — E042 Nontoxic multinodular goiter: Secondary | ICD-10-CM | POA: Diagnosis not present

## 2020-04-15 DIAGNOSIS — I4891 Unspecified atrial fibrillation: Secondary | ICD-10-CM | POA: Diagnosis not present

## 2020-04-15 DIAGNOSIS — Z87442 Personal history of urinary calculi: Secondary | ICD-10-CM | POA: Diagnosis not present

## 2020-04-23 ENCOUNTER — Other Ambulatory Visit: Payer: Medicare PPO

## 2020-04-24 ENCOUNTER — Ambulatory Visit
Admission: RE | Admit: 2020-04-24 | Discharge: 2020-04-24 | Disposition: A | Discharge status: Home / Self Care | Payer: Medicare PPO | Source: Ambulatory Visit | Attending: Internal Medicine | Admitting: Internal Medicine

## 2020-04-24 DIAGNOSIS — E210 Primary hyperparathyroidism: Secondary | ICD-10-CM

## 2020-04-24 DIAGNOSIS — E041 Nontoxic single thyroid nodule: Secondary | ICD-10-CM | POA: Diagnosis not present

## 2020-04-24 DIAGNOSIS — E042 Nontoxic multinodular goiter: Secondary | ICD-10-CM

## 2020-04-27 DIAGNOSIS — M8589 Other specified disorders of bone density and structure, multiple sites: Secondary | ICD-10-CM | POA: Diagnosis not present

## 2020-04-27 DIAGNOSIS — Z780 Asymptomatic menopausal state: Secondary | ICD-10-CM | POA: Diagnosis not present

## 2020-04-27 DIAGNOSIS — M810 Age-related osteoporosis without current pathological fracture: Secondary | ICD-10-CM | POA: Diagnosis not present

## 2020-05-07 ENCOUNTER — Other Ambulatory Visit (HOSPITAL_COMMUNITY): Payer: Self-pay | Admitting: Internal Medicine

## 2020-05-07 ENCOUNTER — Other Ambulatory Visit: Payer: Self-pay | Admitting: Internal Medicine

## 2020-05-07 DIAGNOSIS — E210 Primary hyperparathyroidism: Secondary | ICD-10-CM

## 2020-05-11 DIAGNOSIS — C44319 Basal cell carcinoma of skin of other parts of face: Secondary | ICD-10-CM | POA: Diagnosis not present

## 2020-05-28 ENCOUNTER — Ambulatory Visit (INDEPENDENT_AMBULATORY_CARE_PROVIDER_SITE_OTHER): Payer: Medicare PPO

## 2020-05-28 DIAGNOSIS — I495 Sick sinus syndrome: Secondary | ICD-10-CM

## 2020-05-28 LAB — CUP PACEART REMOTE DEVICE CHECK
Battery Remaining Longevity: 127 mo
Battery Voltage: 3.02 V
Brady Statistic AP VP Percent: 0 %
Brady Statistic AP VS Percent: 0 %
Brady Statistic AS VP Percent: 94.56 %
Brady Statistic AS VS Percent: 5.44 %
Brady Statistic RA Percent Paced: 0 %
Brady Statistic RV Percent Paced: 94.56 %
Date Time Interrogation Session: 20220413194352
Lead Channel Impedance Value: 323 Ohm
Lead Channel Impedance Value: 342 Ohm
Lead Channel Impedance Value: 399 Ohm
Lead Channel Impedance Value: 513 Ohm
Lead Channel Pacing Threshold Amplitude: 0.625 V
Lead Channel Pacing Threshold Pulse Width: 0.4 ms
Lead Channel Sensing Intrinsic Amplitude: 0.875 mV
Lead Channel Sensing Intrinsic Amplitude: 3.375 mV
Lead Channel Sensing Intrinsic Amplitude: 3.375 mV
Lead Channel Setting Pacing Amplitude: 2 V
Lead Channel Setting Pacing Pulse Width: 0.6 ms
Lead Channel Setting Sensing Sensitivity: 1.2 mV

## 2020-06-05 ENCOUNTER — Other Ambulatory Visit: Payer: Self-pay | Admitting: Cardiovascular Disease

## 2020-06-05 DIAGNOSIS — N3020 Other chronic cystitis without hematuria: Secondary | ICD-10-CM | POA: Diagnosis not present

## 2020-06-15 NOTE — Progress Notes (Signed)
Remote pacemaker transmission.   

## 2020-07-15 DIAGNOSIS — L602 Onychogryphosis: Secondary | ICD-10-CM | POA: Diagnosis not present

## 2020-07-15 DIAGNOSIS — I739 Peripheral vascular disease, unspecified: Secondary | ICD-10-CM | POA: Diagnosis not present

## 2020-07-15 DIAGNOSIS — L84 Corns and callosities: Secondary | ICD-10-CM | POA: Diagnosis not present

## 2020-07-23 DIAGNOSIS — E042 Nontoxic multinodular goiter: Secondary | ICD-10-CM | POA: Diagnosis not present

## 2020-07-23 DIAGNOSIS — E210 Primary hyperparathyroidism: Secondary | ICD-10-CM | POA: Diagnosis not present

## 2020-07-23 DIAGNOSIS — M810 Age-related osteoporosis without current pathological fracture: Secondary | ICD-10-CM | POA: Diagnosis not present

## 2020-07-23 DIAGNOSIS — Z8673 Personal history of transient ischemic attack (TIA), and cerebral infarction without residual deficits: Secondary | ICD-10-CM | POA: Diagnosis not present

## 2020-07-23 DIAGNOSIS — I4891 Unspecified atrial fibrillation: Secondary | ICD-10-CM | POA: Diagnosis not present

## 2020-07-23 DIAGNOSIS — R9389 Abnormal findings on diagnostic imaging of other specified body structures: Secondary | ICD-10-CM | POA: Diagnosis not present

## 2020-07-23 DIAGNOSIS — Z87442 Personal history of urinary calculi: Secondary | ICD-10-CM | POA: Diagnosis not present

## 2020-07-27 DIAGNOSIS — H04123 Dry eye syndrome of bilateral lacrimal glands: Secondary | ICD-10-CM | POA: Diagnosis not present

## 2020-07-27 DIAGNOSIS — H52203 Unspecified astigmatism, bilateral: Secondary | ICD-10-CM | POA: Diagnosis not present

## 2020-07-27 DIAGNOSIS — H524 Presbyopia: Secondary | ICD-10-CM | POA: Diagnosis not present

## 2020-07-31 DIAGNOSIS — Z85828 Personal history of other malignant neoplasm of skin: Secondary | ICD-10-CM | POA: Diagnosis not present

## 2020-07-31 DIAGNOSIS — L905 Scar conditions and fibrosis of skin: Secondary | ICD-10-CM | POA: Diagnosis not present

## 2020-08-18 ENCOUNTER — Telehealth: Payer: Self-pay | Admitting: Cardiovascular Disease

## 2020-08-18 NOTE — Telephone Encounter (Signed)
Pt c/o swelling: STAT is pt has developed SOB within 24 hours  If swelling, where is the swelling located? Left ankle  How much weight have you gained and in what time span? About 3 pounds in a week  Have you gained 3 pounds in a day or 5 pounds in a week? Gained 3 lbs in a day from 6/30 to 7/1  Do you have a log of your daily weights (if so, list)? 6/28 154.4, 6/29 152.2, 6/30 153.8, 7/1 156.8, 7/2 157.4, 7/3 157.6, 7/4 157.8, did not write this mornings weight but thinks it was 157.4  Are you currently taking a fluid pill? yes  Are you currently SOB? no  Have you traveled recently? no   Patient's son states the patient's weight has been increasing and she is having swelling in her left ankle. He states the lasix instructions say to take as needed when weight is above 143 lbs. He states she has been taking it when they noticed her weight increasing, but it has not helped. He states she has no other symptoms and her BP has been spot on. He states the best number to call is his: [phone], but he has an appt and if he does not answer to call his sister [phone]

## 2020-08-18 NOTE — Telephone Encounter (Signed)
Called patient, he states that his mother is having swelling in her left leg. He states it is localized to that area, not in the right leg or anywhere else. He mentions weight gain (see weights below) he states the lasix RX was given to give her if weight is above 143. They have been giving it to her daily since about May, but recently did not see any changes and only the weight increasing in the last few days. There is no change to her leg when elevating it, it stays swollen, patient does not wear compression stockings, no changes to any other medications (continued all other meds), patient is not SOB, or having any chest pains. Patients diet is brought to her by meals on wheels and they are advised to do a low sodium diet, so per son she does eat healthy with limited salt. Patient BP/HR has been good per son- today's 126/72 HR 75. Just concerned with the increased weight and swelling.  Advised I would route a message to MD to see of his recommendations and would call him or sister back to notify.

## 2020-08-18 NOTE — Telephone Encounter (Signed)
Called patient son, advised of note from MD.  Patient son verbalized understanding.

## 2020-08-21 ENCOUNTER — Telehealth: Payer: Self-pay | Admitting: Cardiovascular Disease

## 2020-08-21 NOTE — Telephone Encounter (Signed)
Left message for pt son to call  

## 2020-08-21 NOTE — Telephone Encounter (Signed)
Follow up:     Patient son calling to speak with nurse.

## 2020-08-21 NOTE — Telephone Encounter (Signed)
Spoke with pt son, he reports the swelling has gone down some but is still there. She is not wanting to take the 80 mg because she is constantly having to go to the bathroom. Discussed with the son splitting up to 40 mg twice daily to see if that makes a difference. He reports they do not give the furosemide if she has an appointment, encouraged him to make sure she gets at least one dose of medication daily or her swelling will get worse.  He reports she can not use compression hose because she is unable to get them on. Aware will forward to dr croitoru to make him aware and see what the next step is.

## 2020-08-21 NOTE — Telephone Encounter (Signed)
Spoke to son (ok per [facility]) aware of MD recommendations and verbalized understanding.   Croitoru, Mihai, MD   2:51 PM As long as breathing is OK, there is no rush to get rid of the additional weight and swelling. We can just do the double dose lasix every other day until she is back to baseline.  Making sure that she limits her salt intake will help minimize the need toincrease the lasix dose.

## 2020-08-21 NOTE — Telephone Encounter (Signed)
Left message for pt son and daughter to return the call

## 2020-08-21 NOTE — Telephone Encounter (Signed)
Duplicate phone note-see other encounter

## 2020-08-21 NOTE — Telephone Encounter (Signed)
Son of the patient called to follow up on his call from Wednesday. The patient's weigh dropped from 158.8 to 156. The patient has felt miserable because she is frequently urinating. The son just gave the patient one lasix this morning but is not sure what to do next.  The son has an appointment at 3. If the office can not reach him. Please call his Sister Rosalyn Gess at [phone]

## 2020-08-24 DIAGNOSIS — R3121 Asymptomatic microscopic hematuria: Secondary | ICD-10-CM | POA: Diagnosis not present

## 2020-08-24 DIAGNOSIS — N3020 Other chronic cystitis without hematuria: Secondary | ICD-10-CM | POA: Diagnosis not present

## 2020-08-24 DIAGNOSIS — N952 Postmenopausal atrophic vaginitis: Secondary | ICD-10-CM | POA: Diagnosis not present

## 2020-08-26 LAB — CUP PACEART REMOTE DEVICE CHECK
Battery Remaining Longevity: 124 mo
Battery Voltage: 3.01 V
Brady Statistic AP VP Percent: 0 %
Brady Statistic AP VS Percent: 0 %
Brady Statistic AS VP Percent: 97.21 %
Brady Statistic AS VS Percent: 2.79 %
Brady Statistic RA Percent Paced: 0 %
Brady Statistic RV Percent Paced: 97.21 %
Date Time Interrogation Session: 20220713065046
Lead Channel Impedance Value: 342 Ohm
Lead Channel Impedance Value: 342 Ohm
Lead Channel Impedance Value: 399 Ohm
Lead Channel Impedance Value: 513 Ohm
Lead Channel Pacing Threshold Amplitude: 0.75 V
Lead Channel Pacing Threshold Pulse Width: 0.4 ms
Lead Channel Sensing Intrinsic Amplitude: 0.875 mV
Lead Channel Sensing Intrinsic Amplitude: 2.625 mV
Lead Channel Sensing Intrinsic Amplitude: 2.625 mV
Lead Channel Setting Pacing Amplitude: 2 V
Lead Channel Setting Pacing Pulse Width: 0.6 ms
Lead Channel Setting Sensing Sensitivity: 1.2 mV

## 2020-08-27 ENCOUNTER — Ambulatory Visit (INDEPENDENT_AMBULATORY_CARE_PROVIDER_SITE_OTHER): Payer: Medicare PPO

## 2020-08-27 DIAGNOSIS — I495 Sick sinus syndrome: Secondary | ICD-10-CM

## 2020-09-14 DIAGNOSIS — L304 Erythema intertrigo: Secondary | ICD-10-CM | POA: Diagnosis not present

## 2020-09-19 NOTE — Progress Notes (Signed)
Remote pacemaker transmission.   

## 2020-09-30 ENCOUNTER — Other Ambulatory Visit: Payer: Self-pay | Admitting: Cardiovascular Disease

## 2020-09-30 DIAGNOSIS — L84 Corns and callosities: Secondary | ICD-10-CM | POA: Diagnosis not present

## 2020-09-30 DIAGNOSIS — L602 Onychogryphosis: Secondary | ICD-10-CM | POA: Diagnosis not present

## 2020-09-30 DIAGNOSIS — I739 Peripheral vascular disease, unspecified: Secondary | ICD-10-CM | POA: Diagnosis not present

## 2020-09-30 NOTE — Telephone Encounter (Signed)
Rx(s) sent to pharmacy electronically.  

## 2020-10-02 IMAGING — CT CT HEAD W/O CM
4 series · 17 of 47 positions shown, 19 images · non-contrast
Comparison: 10/31/2018 and 09/12/2018

CLINICAL DATA: Follow-up stroke.

EXAM:
CT HEAD WITHOUT CONTRAST
TECHNIQUE: Contiguous axial images were obtained from the base of the skull
through the vertex without intravenous contrast.

[Series 3: head without · axial · non-contrast · 0.42mm/px · z∈[-150,-30]mm · 7 of 33 slices shown, 9 images]
[im 5/33  brain]
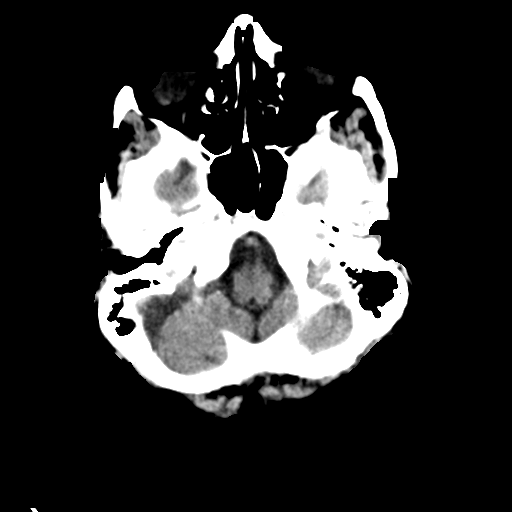
[im 5/33  bone]
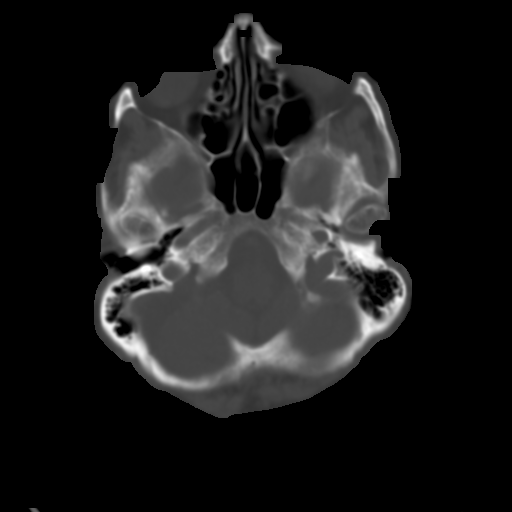
[im 9/33  brain]
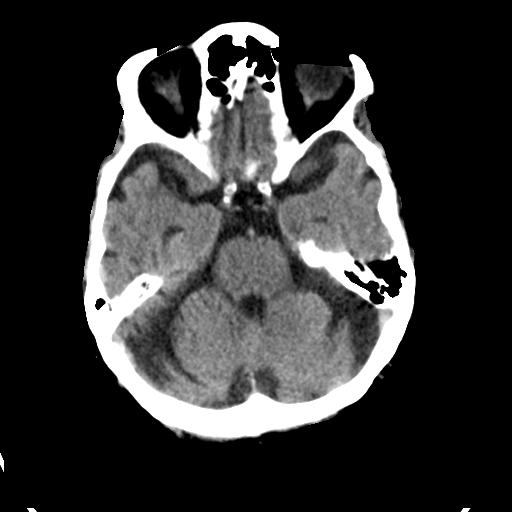
[im 13/33  brain]
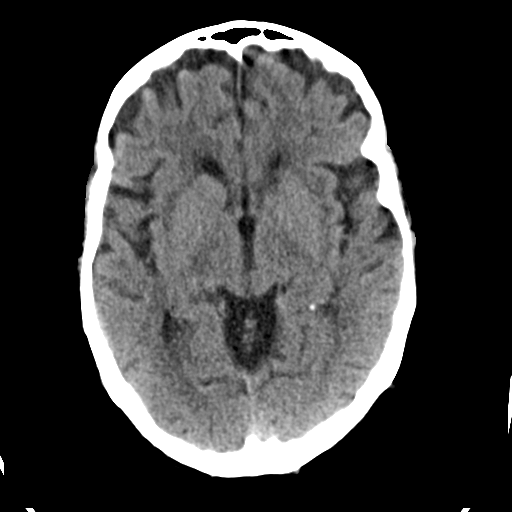
[im 17/33  brain]
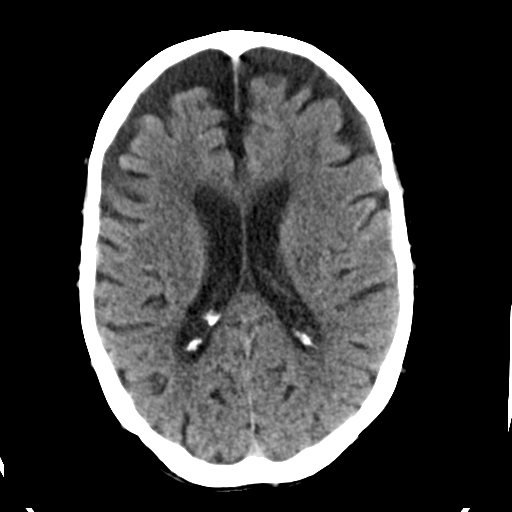
[im 21/33  brain]
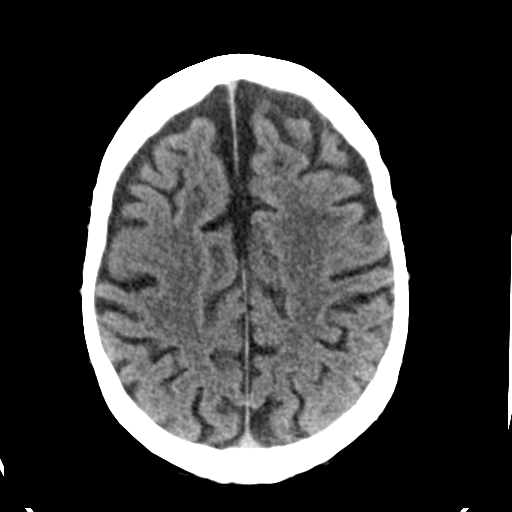
[im 21/33  bone]
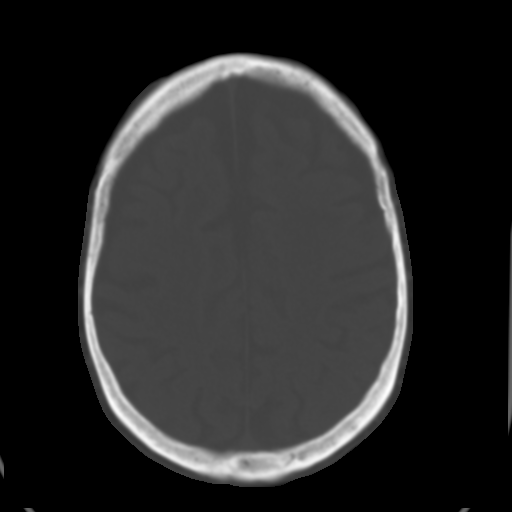
[im 25/33  brain]
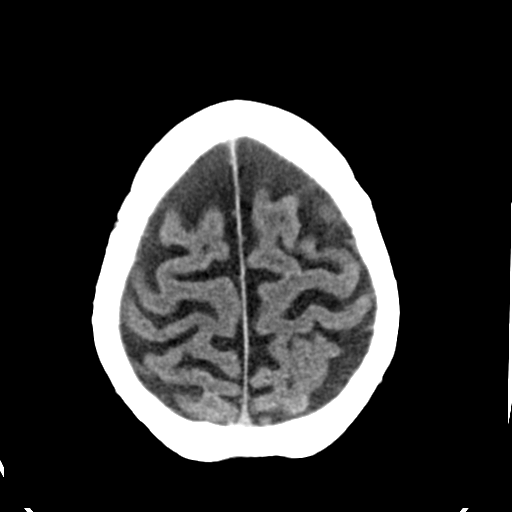
[im 29/33  brain]
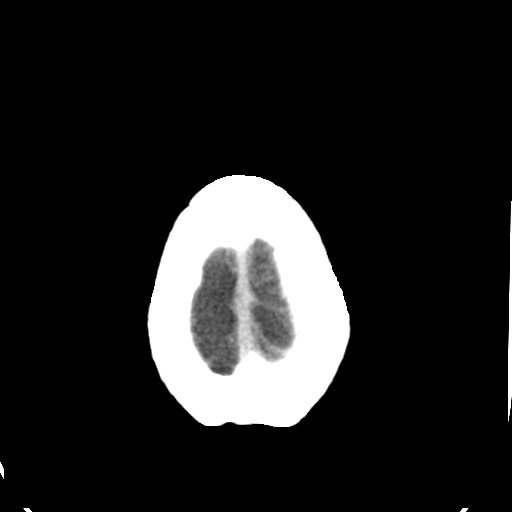

[Series 4: head bone · axial · 0.42mm/px · z∈[-154,-98]mm · 4 of 81 slices shown]
[im 9/81  bone]
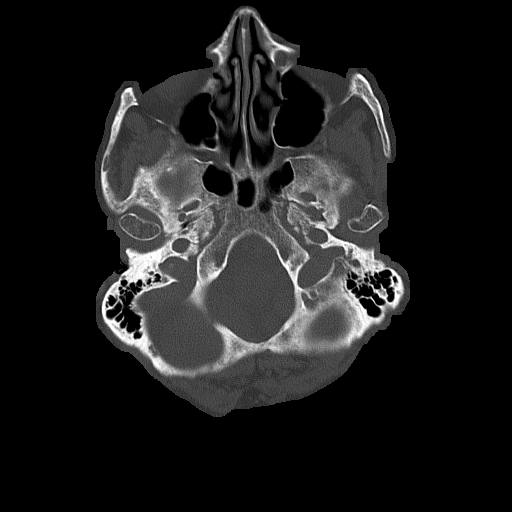
[im 17/81  bone]
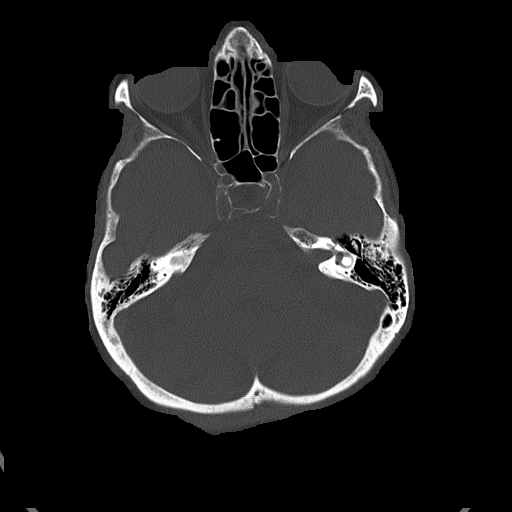
[im 25/81  bone]
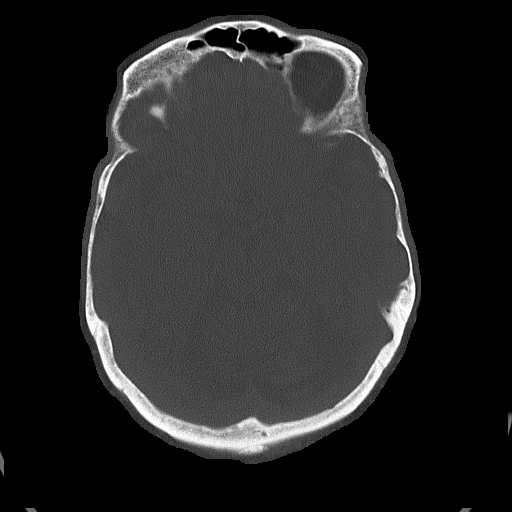
[im 37/81  bone]
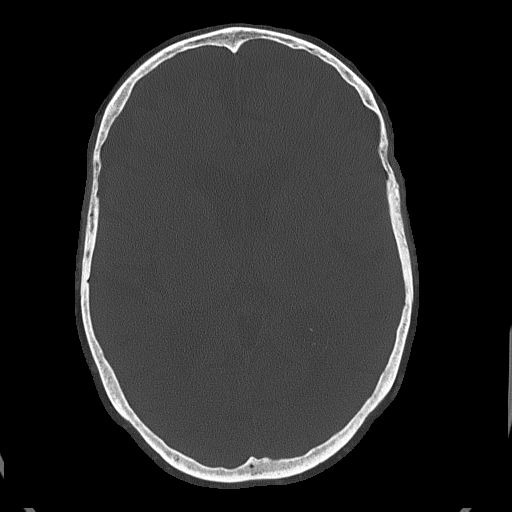

[Series 5: head without cor · coronal · non-contrast · 0.31mm/px · 3 of 70 slices shown]
[im 24/70  brain]
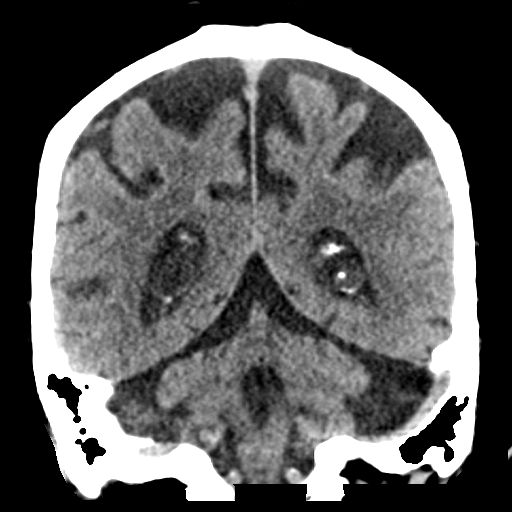
[im 31/70  brain]
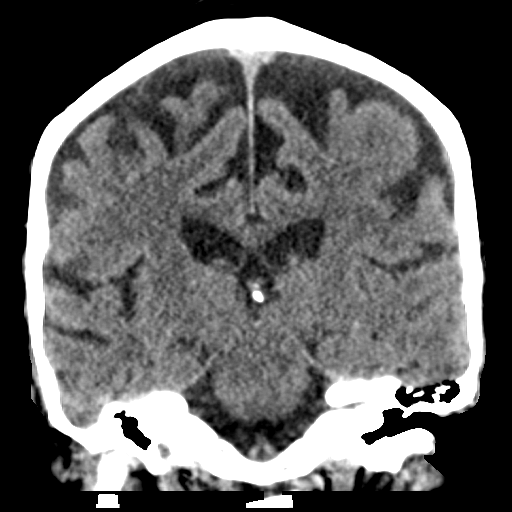
[im 39/70  brain]
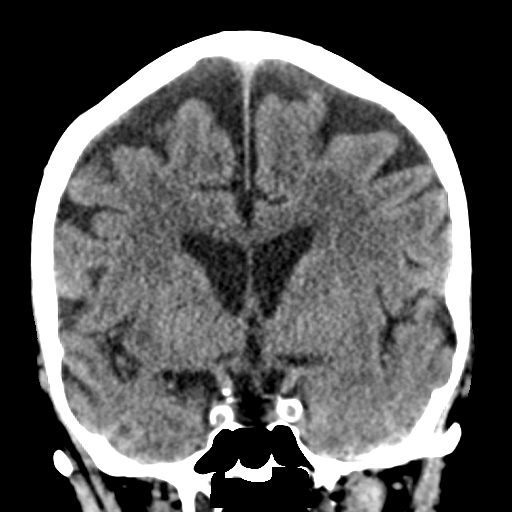

[Series 6: head without sag · sagittal · non-contrast · 0.31mm/px · 3 of 57 slices shown]
[im 19/57  brain]
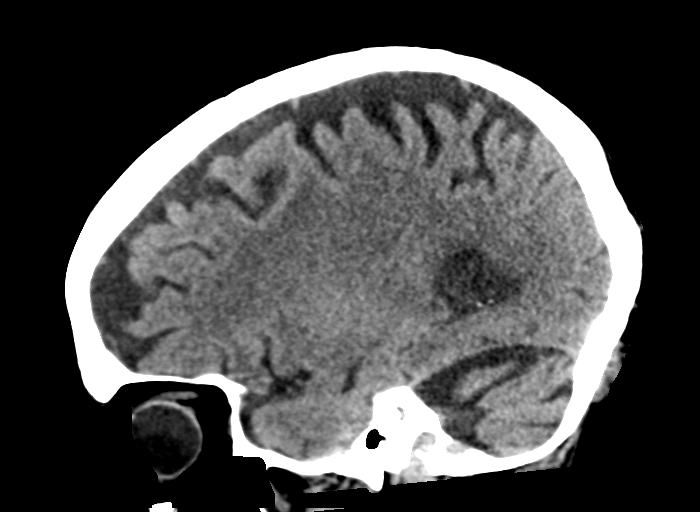
[im 29/57  brain]
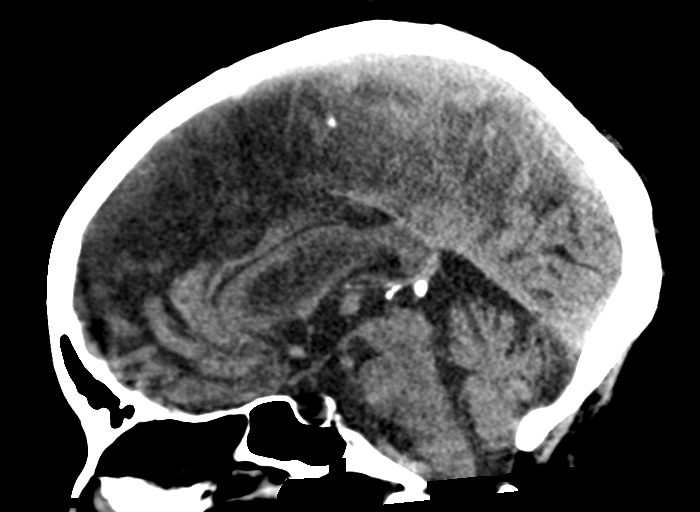
[im 38/57  brain]
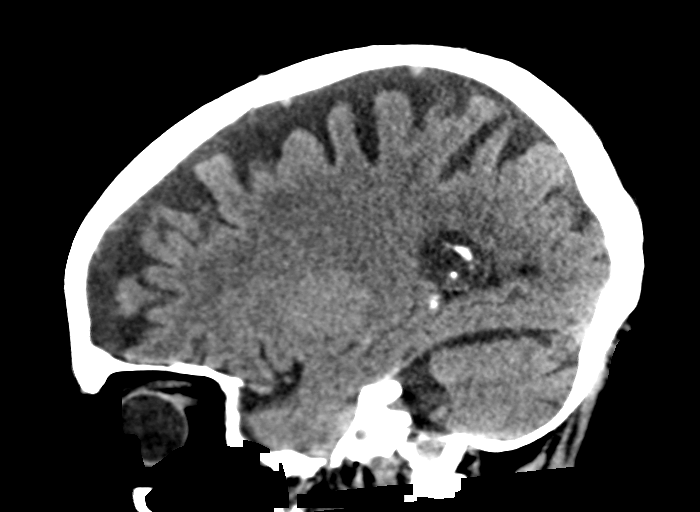

[17 of 47 positions shown; findings below may reference images not displayed]

FINDINGS: Brain: Ventricles and cisterns are within normal. Prominent CSF
spaces compatible with age related atrophic change and stable. No
mass, mass effect, shift of midline structures or acute hemorrhage.
Mild chronic ischemic microvascular disease is present. No evidence
of acute infarction.

Vascular: No hyperdense vessel or unexpected calcification.

Skull: Normal. Negative for fracture or focal lesion.

Sinuses/Orbits: No acute finding.

Other: None.
IMPRESSION: No acute findings.

Age related atrophy and chronic ischemic microvascular disease.

## 2020-10-07 DIAGNOSIS — N200 Calculus of kidney: Secondary | ICD-10-CM | POA: Diagnosis not present

## 2020-10-07 DIAGNOSIS — N3020 Other chronic cystitis without hematuria: Secondary | ICD-10-CM | POA: Diagnosis not present

## 2020-10-07 DIAGNOSIS — N952 Postmenopausal atrophic vaginitis: Secondary | ICD-10-CM | POA: Diagnosis not present

## 2020-10-08 DIAGNOSIS — R609 Edema, unspecified: Secondary | ICD-10-CM | POA: Diagnosis not present

## 2020-10-08 DIAGNOSIS — F419 Anxiety disorder, unspecified: Secondary | ICD-10-CM | POA: Diagnosis not present

## 2020-10-08 DIAGNOSIS — R58 Hemorrhage, not elsewhere classified: Secondary | ICD-10-CM | POA: Diagnosis not present

## 2020-10-11 ENCOUNTER — Other Ambulatory Visit: Payer: Self-pay | Admitting: Cardiovascular Disease

## 2020-10-12 ENCOUNTER — Other Ambulatory Visit: Payer: Self-pay

## 2020-10-12 ENCOUNTER — Ambulatory Visit (INDEPENDENT_AMBULATORY_CARE_PROVIDER_SITE_OTHER): Payer: Medicare PPO | Admitting: Cardiovascular Disease

## 2020-10-12 ENCOUNTER — Encounter: Payer: Self-pay | Admitting: Cardiovascular Disease

## 2020-10-12 VITALS — BP 154/76 | HR 79 | Ht 63.0 in | Wt 160.0 lb

## 2020-10-12 DIAGNOSIS — I10 Essential (primary) hypertension: Secondary | ICD-10-CM | POA: Diagnosis not present

## 2020-10-12 DIAGNOSIS — Z950 Presence of cardiac pacemaker: Secondary | ICD-10-CM

## 2020-10-12 DIAGNOSIS — I4821 Permanent atrial fibrillation: Secondary | ICD-10-CM | POA: Diagnosis not present

## 2020-10-12 DIAGNOSIS — I5032 Chronic diastolic (congestive) heart failure: Secondary | ICD-10-CM | POA: Diagnosis not present

## 2020-10-12 DIAGNOSIS — Z953 Presence of xenogenic heart valve: Secondary | ICD-10-CM | POA: Diagnosis not present

## 2020-10-12 DIAGNOSIS — Z7901 Long term (current) use of anticoagulants: Secondary | ICD-10-CM

## 2020-10-12 DIAGNOSIS — I4729 Other ventricular tachycardia: Secondary | ICD-10-CM

## 2020-10-12 DIAGNOSIS — I472 Ventricular tachycardia: Secondary | ICD-10-CM

## 2020-10-12 MED ORDER — FUROSEMIDE 40 MG PO TABS: ORAL_TABLET | ORAL | 5 refills | Status: DC

## 2020-10-12 NOTE — Progress Notes (Signed)
Cardiology Office Note   Date:  10/13/2020   ID:  [name], DOB 1929/09/25, MRN [id number]  PCP:  Christina Cruel, MD  Cardiologist:  Christina Klein, MD  Electrophysiologist:  None   Evaluation Performed:  Follow-Up Visit  Chief Complaint:  Pacemaker; atrial fibrillation  History of Present Illness:    [name] is a 87 y.o. female with with long-standing atrial fibrillation with slow ventricular response and permanent pacemaker (recent generator change July 2020, Medtronic), systemic hypertension and hyperlipidemia, chronic diastolic heart failure and a biological mitral valve prosthesis (2011, 29 mm Biocor, [name], with left atrial appendage ligation) with moderate prosthetic valve stenosis (mean gradient 9 mmHg at 75 bpm).  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  She is doing well since her last appointment.  She has not had any new neurological events and has not had any recent falls.  Her weight has gone up about 15 pounds since her last appointment and her family thinks this is because of an excellent appetite and that it is largely true weight gain.  She does have very severe varicose veins of the lower extremities and has intermittent edema.  She has a very dark but small circumscribed ecchymosis on the anterior left shin.  She has not had serious bleeding on the anticoagulant.  Becomes easily short of breath walking in the house, but despite this her activity level is quite high at 3.5 hours a day.  She does not have orthopnea or PND.  She has not experienced dizziness, syncope or palpitations.  Her blood pressure is a little high now, although at home it is usually in the 120s/60s.  Her family has been keeping a very detailed log of her heart rate, blood pressure and weight on a daily basis at home.  Her weight has  been slowly and steadily increasing over the last year or Wilkinson.  There has never been a sudden big jump in her weight.  Pacemaker interrogation shows normal device function.  Her current generator was implanted in July 2020 (Medtronic Azure).  She has 93% ventricular pacing and she has occasional episodes of high ventricular rate.  45 have been recorded in the last 13 months.  Some of these were present atrial fibrillation with RVR (irregular rhythm with intracardiac electrogram consistent with the conducted beats seen during interrogation today), while others were present nonsustained ventricular tachycardia.  All of them are very brief.  These have been present at a rate between 1 and 4 events a month for a long time.  They never appear to be symptomatic.  Her underlying rhythm today is atrial fibrillation with a ventricular rate around 50 bpm.  She is not pacemaker dependent.    Past Medical History:  Diagnosis Date   Anxiety    Arthritis    "mostly my hands" (01/29/2015)   Basal cell carcinoma of forehead    Benign essential tremor    CAP (community acquired pneumonia) 01/29/2015   CHF (congestive heart failure) ([place])    Dementia ([place]) 10/18/2018   Depression    Edema of both legs 04-23-2013  pt states feet swelling (wears ted hose)   bilateral venous ablation procedures done in IR, L>R   GERD (gastroesophageal reflux disease)    H/O hiatal hernia    Hyperlipidemia    Hyperparathyroidism ([place])  MILD--  STABLE   Hypertension    Kidney stones    Osteopenia    Permanent atrial fibrillation (HCC)    CARDIOLOGIST-- Christina [name]   Pneumonia "several times"   Presence of permanent cardiac pacemaker    LAST PACER CHECK  04-23-2013  IN EPIC   Right ureteral stone    S/P mitral valve replacement with bioprosthetic valve    04-03-2009   Schatzki's ring    Sick sinus syndrome ([place])    S/P  PACEMAKER  2009   Stroke ([place]) 08/2018   Stroke ([place])    Thrombocytopenia, immune ([place])     CHRONIC---  HEMOTOLOGIST---  [name]   Venous insufficiency, peripheral    Wears glasses    Past Surgical History:  Procedure Laterality Date   CARDIAC CATHETERIZATION  03/27/2009   normal coronaries; grade IV Mitral Insuff.   CARDIAC CATHETERIZATION  2001  &  2006   CARDIAC VALVE REPLACEMENT     CATARACT EXTRACTION W/ INTRAOCULAR LENS  IMPLANT, BILATERAL Bilateral ~ 2012   CYSTOSCOPY W/ URETERAL STENT PLACEMENT Left 08/01/2012   Procedure: CYSTOSCOPY WITH RETROGRADE PYELOGRAM/URETERAL STENT PLACEMENT;  Surgeon: Christina Gray, MD;  Location: [facility];  Service: Urology;  Laterality: Left;   CYSTOSCOPY WITH URETEROSCOPY Left 08/14/2012   Procedure: LEFT URETEROSCOPY WITH HOLMIUM LASER AND LEFT  STENT PLACEMENT;  Surgeon: Christina So, MD;  Location: [facility];  Service: Urology;  Laterality: Left;   CYSTOSCOPY WITH URETEROSCOPY AND STENT PLACEMENT Right 04/25/2013   Procedure: RIGHT URETEROSCOPY WITH  STENT PLACEMENT;  Surgeon: Christina Seal, MD;  Location: [facility];  Service: Urology;  Laterality: Right;   ESOPHAGOGASTRODUODENOSCOPY (EGD) WITH ESOPHAGEAL DILATION  "a couple times" (01/29/2015)   EXTRACORPOREAL SHOCK WAVE LITHOTRIPSY Left 07-26-2012   HAND SURGERY Right 05/2011   "no fracture; straightened things up   HAND SURGERY Left 2014   "no fracture; straightened things up"   HOLMIUM LASER APPLICATION Left [id number]   Procedure:  HOLMIUM LASER APPLICATION;  Surgeon: Christina So, MD;  Location: [facility];  Service: Urology;  Laterality: Left;   HOLMIUM LASER APPLICATION Right [id number]   Procedure: HOLMIUM LASER APPLICATION;  Surgeon: Christina Seal, MD;  Location: [facility];  Service: Urology;  Laterality: Right;   MITRAL VALVE REPLACEMENT WITH CHORDAL PRESEVATION USING BIOPROSTHETIC VALVE/  LIGATION LEFT ATRIAL APPENDAGE/  LEFT-SIDED MAZE PROCEDURE  04-03-2009  Christina [name],  SERIAL # [id number]   NM MYOCAR PERF WALL MOTION  03/01/1999   mild lateral ischemia    PERMANENT PACEMAKER INSERTION  02/26/2007   Dual Chamber Medtronic Programmed VVIR--- Generator [id number]/  [id number]    PPM GENERATOR CHANGEOUT N/A 08/20/2018   Procedure: PPM GENERATOR CHANGEOUT;  Surgeon: Christina Klein, MD;  Location: [facility];  Service: Cardiovascular;  Laterality: N/A;   TEE WITHOUT CARDIOVERSION N/A 09/05/2018   Procedure: TRANSESOPHAGEAL ECHOCARDIOGRAM (TEE);  Surgeon: Pixie Casino, MD;  Location[facility];  Service: Cardiovascular;  Laterality: N/A;   TRANSTHORACIC ECHOCARDIOGRAM  09-07-2012  Christina Yanis Larin   MILD LVH/  EF 55-60%/  MILD TO MODERATE AR/   SEVERE LAE  &  RAE/  MILD TO MODERATE TR/   MITRAL VALVE BIOPROSTHESIS LEAFLET NORMAL,  MODERATE STENOSIS WITH NO SIG. REGURG/  MODERATE RVE   VAGINAL HYSTERECTOMY  1982     Current Meds  Medication Sig   acetaminophen (TYLENOL) 500 MG tablet Take 500-1,000 mg by mouth daily as needed for moderate pain  or headache.    ALPRAZolam (XANAX) 0.5 MG tablet Take 0.5-1 tablets (0.25-0.5 mg total) by mouth See admin instructions. Take 1/2 tablet every morning and take 1 tablet at night   Ascorbic Acid (VITAMIN C) 1000 MG tablet Take 500 mg by mouth daily.    b complex vitamins capsule Take 1 capsule by mouth daily.   cephALEXin (KEFLEX) 500 MG capsule Take 500 mg by mouth daily. For five days   Cholecalciferol (VITAMIN D) 50 MCG (2000 UT) tablet Take 2,000 Units by mouth daily.   CVS VITAMIN E 1000 units capsule Take 450 capsules by mouth daily.   donepezil (ARICEPT) 5 MG tablet Take 5 mg by mouth at bedtime.   ELIQUIS 5 MG TABS tablet Take 5 mg by mouth 2 (two) times daily.   Melatonin 10 MG TABS Take 1 tablet by mouth at bedtime.   olmesartan (BENICAR) 20 MG tablet Take 1 tablet (20 mg total) by mouth daily.   omeprazole (PRILOSEC) 20 MG capsule Take 20 mg by mouth daily.   OVER THE COUNTER MEDICATION Take 1 tablet by mouth 2 (two) times daily. Nutrifuron (immune supplement)   potassium chloride (KLOR-CON)  8 MEQ tablet TAKE 3 TABLETS (24 MEQ) WHEN YOU TAKE THE AS NEEDED FUROSEMIDE.   Probiotic Product (PROBIOTIC-10 ULTIMATE PO) Take 1 capsule by mouth daily.   simvastatin (ZOCOR) 10 MG tablet TAKE 1 TABLET BY MOUTH EVERY DAY AT DINNER (Patient taking differently: Take 10 mg by mouth at bedtime.)   Vit B6-Vit B12-Omega 3 Acids (VITAMIN B PLUS+ PO) Take 1 tablet by mouth daily.   [DISCONTINUED] furosemide (LASIX) 40 MG tablet Take one tablet, 40 mg, daily as needed for a weight over 143 pounds.   [DISCONTINUED] vitamin E 1000 UNIT capsule Take 1 tablet by mouth daily.     Allergies:   Zoloft [sertraline hcl]   Social History   Tobacco Use   Smoking status: Never   Smokeless tobacco: Never   Tobacco comments:    never used tobacco  Vaping Use   Vaping Use: Never used  Substance Use Topics   Alcohol use: No    Alcohol/week: 0.0 standard drinks   Drug use: No     Family Hx: The patient's family history includes Cancer in her mother; Heart attack in her father.  ROS:   Please see the history of present illness.    All other systems are reviewed and are negative.  Prior CV studies:   The following studies were reviewed today: Comprehensive pacemaker check today.  Labs/Other Tests and Data Reviewed:   Echo 04/16/2019  1. Left ventricular ejection fraction, by estimation, is 50 to 55%. The left ventricle has low normal function. The left ventricle has no regional wall motion abnormalities. Left ventricular diastolic function could not be evaluated. Left ventricular diastolic function could not be evaluated.   2. Right ventricular systolic function is normal. The right ventricular size is mildly enlarged. There is moderately elevated pulmonary artery systolic pressure.   3. Left atrial size was severely dilated.   4. Right atrial size was severely dilated.   5. The mitral valve has been repaired/replaced. No evidence of mitral valve regurgitation. Mild mitral stenosis. The mean mitral  valve gradient is 7.0 mmHg with average heart rate of 70 bpm. There is a 29 mm Biocor bioprosthetic valve present in the  mitral position. Procedure Date: 2011. Echo findings are consistent with normal structure and function of the mitral valve prosthesis.   6. Tricuspid  valve regurgitation is moderate.   7. The aortic valve is tricuspid. Aortic valve regurgitation is mild.  Mild aortic valve sclerosis is present, with no evidence of aortic valve stenosis.   8. There is borderline dilatation of the ascending aorta measuring 37 mm.   9. The inferior vena cava is dilated in size with >50% respiratory  variability, suggesting right atrial pressure of 8 mmHg.   Comparison(s): Changes from prior study are noted. Left ventricular systolic function is lower, but there are no regional wall motion abnormalities. There is no change in mitral prosthesis gradients, allowing for changes in heart rate.   EKG: Ordered today shows atrial fibrillation with almost entirely ventricular paced rhythm  Recent Labs: 12/06/2019: BUN 21; Creatinine, Ser 0.99; Hemoglobin 12.7; Magnesium 2.1; Platelets 102; Potassium 4.8; Sodium 138   Recent Lipid Panel Lab Results  Component Value Date/Time   CHOL 160 11/02/2018 03:27 AM   TRIG 73 11/02/2018 03:27 AM   HDL 63 11/02/2018 03:27 AM   CHOLHDL 2.5 11/02/2018 03:27 AM   LDLCALC 82 11/02/2018 03:27 AM    Wt Readings from Last 3 Encounters:  10/12/20 160 lb (72.6 kg)  02/28/20 146 lb 12.8 oz (66.6 kg)  09/03/19 141 lb (64 kg)     Objective:    Vital Signs:  BP (!) 154/76 (BP Location: Left Arm, Patient Position: Sitting, Cuff Size: Normal)   Pulse 79   Ht '5\' 3"'$  (1.6 m)   Wt 160 lb (72.6 kg)   SpO2 98%   BMI 28.34 kg/m     General: Alert, oriented x3, no distress, moderately overweight Head: no evidence of trauma, PERRL, EOMI, no exophtalmos or lid lag, no myxedema, no xanthelasma; normal ears, nose and oropharynx Neck: Elevated jugular venous pulsations  about 8 cm, and prompt hepatojugular reflux; brisk carotid pulses without delay and no carotid bruits Chest: clear to auscultation, no signs of consolidation by percussion or palpation, normal fremitus, symmetrical and full respiratory excursions Cardiovascular: normal position and quality of the apical impulse, regular rhythm, normal first and second heart sounds, early peaking 2/6 aortic ejection murmur, no diastolic murmurs, rubs or gallops Abdomen: no tenderness or distention, no masses by palpation, no abnormal pulsatility or arterial bruits, normal bowel sounds, no hepatosplenomegaly Extremities: no clubbing, cyanosis; large prominent varicose veins bilaterally; 2 cm ecchymosis anterior left shin, trivial ankle edema; 2+ radial, ulnar and brachial pulses bilaterally; 2+ right femoral, posterior tibial and dorsalis pedis pulses; 2+ left femoral, posterior tibial and dorsalis pedis pulses; no subclavian or femoral bruits Neurological: grossly nonfocal Psych: Normal mood and ASSESSMENT & PLAN:    1. Permanent atrial fibrillation ([place])   2. Long term (current) use of anticoagulants   3. Pacemaker   4. NSVT (nonsustained ventricular tachycardia) ([place])   5. S/P mitral valve replacement with bioprosthetic valve   6. Chronic diastolic heart failure ([place])   7. Essential hypertension       AFib: Almost always very well rate controlled.  She is on beta-blockers for treatment of nonsustained VT rather than for the atrial fibrillation.  She has had her left atrial appendage clipped but despite this developed very large left atrial thrombi and had a stroke manifesting with aphasia, Wilkinson she is on anticoagulation.     Anticoagulation: No recent falls or bleeding complications. PM :   Device function, continue remote downloads every 3 months. NSVT: Still with episodes appear to be relatively lengthy around 30 beats, but the rate is not particularly fast and the  episodes remain asymptomatic.  Continue  beta-blocker. S/P bioMVR: She has moderately elevated gradients across the prosthesis, but is not a candidate for redo surgery.  Currently asymptomatic.  Avoid tachycardia. CHF: She does have evidence of hypervolemia today.  She also appears to have gained some true weight.  We will reset her "dry weight" at 150 pounds.  Have given a weight-based furosemide prescription to achieve this. HTN: As has been in the case in the past, her blood pressure is often a little high in the office, but consistently very well controlled at home.   Medication Adjustments/Labs and Tests Ordered: Current medicines are reviewed at length with the patient today.  Concerns regarding medicines are outlined above.   Patient Instructions  Medication Instructions:  IF WEIGHT IS LESS THAN 150 LBS TAKE NO LASIX  IF WEIGHT IS 150lbs-155 LBS TAKE (1) LASIX TAB ('40mg'$ ) and 3 POTASSIUM TABLETS (55mq) IF WEIGHT IS OVER 155 LBS TAKE (2) TABLETS OF LASIX ('80mg'$ ) and (6) TABLETS OF POTASSIUM (446m) *If you need a refill on your cardiac medications before your next appointment, please call your pharmacy*  Follow-Up: [facility] are our [facility].  As part of our continuing mission to provide you with exceptional heart care, we have created designated Provider Care Teams.  These Care Teams include your primary Cardiologist (physician) and Advanced Practice Providers (APPs -  Physician Assistants and Nurse Practitioners) who all work together to provide you with the care you need, when you need it.  We recommend signing up for the patient portal called "MyChart".  Sign up information is provided on this After Visit Summary.  MyChart is used to connect with patients for Virtual Visits (Telemedicine).  Patients are able to view lab/test results, encounter notes, upcoming appointments, etc.  Non-urgent messages can be sent to your provider as well.   To learn more about what you can do with MyChart, go to  [email or web address]   Your next appointment:   1 year(s)  The format for your next appointment:   In Person  Provider:   MiSanda KleinMD               Signed, MiSanda KleinMD  10/13/2020 7:52 PM    [employer]

## 2020-10-12 NOTE — Patient Instructions (Signed)
Medication Instructions:  IF WEIGHT IS LESS THAN 150 LBS TAKE NO LASIX  IF WEIGHT IS 150lbs-155 LBS TAKE (1) LASIX TAB ('40mg'$ ) and 3 POTASSIUM TABLETS (92mq) IF WEIGHT IS OVER 155 LBS TAKE (2) TABLETS OF LASIX ('80mg'$ ) and (6) TABLETS OF POTASSIUM (435m) *If you need a refill on your cardiac medications before your next appointment, please call your pharmacy*  Follow-Up: [facility].  As part of our continuing mission to provide you with exceptional heart care, we have created designated Provider Care Teams.  These Care Teams include your primary Cardiologist (physician) and Advanced Practice Providers (APPs -  Physician Assistants and Nurse Practitioners) who all work together to provide you with the care you need, when you need it.  We recommend signing up for the patient portal called "MyChart".  Sign up information is provided on this After Visit Summary.  MyChart is used to connect with patients for Virtual Visits (Telemedicine).  Patients are able to view lab/test results, encounter notes, upcoming appointments, etc.  Non-urgent messages can be sent to your provider as well.   To learn more about what you can do with MyChart, go to [email or web address]   Your next appointment:   1 year(s)  The format for your next appointment:   In Person  Provider:   MiSanda KleinMD

## 2020-10-15 DIAGNOSIS — L304 Erythema intertrigo: Secondary | ICD-10-CM | POA: Diagnosis not present

## 2020-11-19 DIAGNOSIS — L304 Erythema intertrigo: Secondary | ICD-10-CM | POA: Diagnosis not present

## 2020-11-19 DIAGNOSIS — L821 Other seborrheic keratosis: Secondary | ICD-10-CM | POA: Diagnosis not present

## 2020-11-23 ENCOUNTER — Telehealth: Payer: Self-pay | Admitting: Cardiovascular Disease

## 2020-11-23 NOTE — Telephone Encounter (Signed)
Pt c/o swelling: STAT is pt has developed SOB within 24 hours  If swelling, where is the swelling located? A little swelling in left ankle  How much weight have you gained and in what time span? 155.8 (09/23/20) 161.8 (11/23/20)  Have you gained 3 pounds in a day or 5 pounds in a week? No   Do you have a log of your daily weights (if so, list)? Yes   Are you currently taking a fluid pill? Yes   Are you currently SOB? No   Have you traveled recently? No

## 2020-11-23 NOTE — Telephone Encounter (Signed)
Spoke with pt's son. "Her weight has been trending up with taking Lasix how prescribed by Dr. Loletha Grayer. I want to know what we need to do Dr. Loletha Grayer wants her to be 150 or less. 10 pounds of fluid at last appointment. 2 Lasix if weight over 155 1 Lasix over 150-155 150 - no Lasix 161 today. "  Call back on [phone], leave detailed message if he does not pick up.

## 2020-11-26 ENCOUNTER — Ambulatory Visit (INDEPENDENT_AMBULATORY_CARE_PROVIDER_SITE_OTHER): Payer: Medicare PPO

## 2020-11-26 DIAGNOSIS — I495 Sick sinus syndrome: Secondary | ICD-10-CM | POA: Diagnosis not present

## 2020-11-26 LAB — CUP PACEART REMOTE DEVICE CHECK
Battery Remaining Longevity: 118 mo
Battery Voltage: 3.01 V
Brady Statistic AP VP Percent: 0 %
Brady Statistic AP VS Percent: 0 %
Brady Statistic AS VP Percent: 94 %
Brady Statistic AS VS Percent: 6 %
Brady Statistic RA Percent Paced: 0 %
Brady Statistic RV Percent Paced: 94 %
Date Time Interrogation Session: 20221012002640
Lead Channel Impedance Value: 323 Ohm
Lead Channel Impedance Value: 342 Ohm
Lead Channel Impedance Value: 380 Ohm
Lead Channel Impedance Value: 513 Ohm
Lead Channel Pacing Threshold Amplitude: 0.75 V
Lead Channel Pacing Threshold Pulse Width: 0.4 ms
Lead Channel Sensing Intrinsic Amplitude: 0.875 mV
Lead Channel Sensing Intrinsic Amplitude: 3.5 mV
Lead Channel Sensing Intrinsic Amplitude: 3.5 mV
Lead Channel Setting Pacing Amplitude: 2 V
Lead Channel Setting Pacing Pulse Width: 0.6 ms
Lead Channel Setting Sensing Sensitivity: 1.2 mV

## 2020-12-03 ENCOUNTER — Telehealth: Payer: Self-pay | Admitting: Cardiovascular Disease

## 2020-12-03 NOTE — Telephone Encounter (Signed)
Pt c/o swelling: STAT is pt has developed SOB within 24 hours  If swelling, where is the swelling located? Left ankle is swollen a lot and right calf  How much weight have you gained and in what time span? In the last 2 days about   Have you gained 3 pounds in a day or 5 pounds in a week? In 2 days  Do you have a log of your daily weights (if so, list)?  no  Are you currently taking a fluid pill? yes  Are you currently SOB?  no  Have you traveled recently? no

## 2020-12-03 NOTE — Telephone Encounter (Signed)
Returned call to patient's son MD spoke with him on 11/26/20  He reports patient has had left ankle swelling and right calf swelling  Patient denies shortness of breath, abdominal swelling, orthopnea Rests/sleeps with legs elevated  Weight yesterday 165.8 lbs - gave 2 lasix  Weight today 164.6 lbs - gave 1 lasix Son does not feel like extra lasix helped with swelling  Son reports her diet is oatmeal/banana, deli [place] sandwich/chips, citracel/pretzels for snack, meals on wheels for dinner (?low sodium). On weekends, may eat pizza, chick fil a, varied diet  Son feels like she has been gaining weight from fluid and not "true" weight gain   Will send to MD to advise on if changes should be made or monitor  Can call son or daughter with recommendations

## 2020-12-04 NOTE — Progress Notes (Signed)
Remote pacemaker transmission.   

## 2020-12-30 DIAGNOSIS — L603 Nail dystrophy: Secondary | ICD-10-CM | POA: Diagnosis not present

## 2020-12-30 DIAGNOSIS — I739 Peripheral vascular disease, unspecified: Secondary | ICD-10-CM | POA: Diagnosis not present

## 2020-12-30 DIAGNOSIS — B351 Tinea unguium: Secondary | ICD-10-CM | POA: Diagnosis not present

## 2020-12-31 ENCOUNTER — Telehealth (INDEPENDENT_AMBULATORY_CARE_PROVIDER_SITE_OTHER): Payer: Medicare PPO | Admitting: Cardiovascular Disease

## 2020-12-31 DIAGNOSIS — Z953 Presence of xenogenic heart valve: Secondary | ICD-10-CM

## 2020-12-31 MED ORDER — AMOXICILLIN 500 MG PO TABS: 2000.0000 mg | ORAL_TABLET | Freq: Once | ORAL | 0 refills | Status: AC

## 2020-12-31 NOTE — Telephone Encounter (Signed)
I s/w pt's son Jeneen Rinks per request, DPR on file. Pt's son is aware pt has been cleared for the dental work and that she will need SBE, son is aware Amoxicillin and directions. I assured pt's son that I will send clearance to dental office with recommendations.      In regard to the pt's son is concerned about the increased swelling for the pt. I asked where is the swelling located. Pt's son indicated in pt's ankles and calves. He states it has become worse lately and that her weight is increasing. He states her weight in October was running in the 150's and now she is running in the 160's the last couple of weeks. I assured pt's son that I will send a message to Dr. Sallyanne Kuster nurse and have her call him back for further information. Pt's son thanked me for the call and the help.

## 2020-12-31 NOTE — Telephone Encounter (Signed)
Patient's son calling for clearance. He states the patient's swelling has also increased.

## 2020-12-31 NOTE — Telephone Encounter (Signed)
      [facility] Pre-operative Risk Assessment    Patient Name: [name]  DOB: [birth year] MRN: [id number]  HEARTCARE STAFF:  - IMPORTANT!!!!!! Under Visit Info/Reason for Call, type in Other and utilize the format Clearance MM/DD/YY or Clearance TBD. Do not use dashes or single digits. - Please review there is not already an duplicate clearance open for this procedure. - If request is for dental extraction, please clarify the # of teeth to be extracted. - If the patient is currently at the dentist's office, call Pre-Op Callback Staff (MA/nurse) to input urgent request.  - If the patient is not currently in the dentist office, please route to the Pre-Op pool.  Request for surgical clearance:  What type of surgery is being performed? 1 tooth Extraction or root canal  When is this surgery scheduled? TBD  What type of clearance is required (medical clearance vs. Pharmacy clearance to hold med vs. Both)? Both  Are there any medications that need to be held prior to surgery and how long? Eliquis   Practice name and name of physician performing surgery? Dr. Anna Genre  What is the office phone number? [phone]   7.   What is the office fax number? [phone]  8.   Anesthesia type (None, local, MAC, general) ? Lidocaine    [name] 12/31/2020, 10:53 AM  _________________________________________________________________   (provider comments below)

## 2020-12-31 NOTE — Telephone Encounter (Signed)
Preoperative team-please contact patient's son and let him know that she has been cleared for her upcoming tooth removal.  Preprocedure antibiotics have also ordered.  Please have them send a message to Dr. Sallyanne Kuster for recommendations on managing her increased fluid.  Thank you.  [name]. [name] NP-C    12/31/2020, 2:51 PM [facility] [place] [address] Office [phone] Fax [phone]

## 2020-12-31 NOTE — Telephone Encounter (Signed)
   Primary Cardiologist: Sanda Klein, MD  Chart reviewed as part of pre-operative protocol coverage. Simple dental extractions are considered low risk procedures per guidelines and generally do not require any specific cardiac clearance. It is also generally accepted that for simple extractions and dental cleanings, there is no need to interrupt blood thinner therapy.   SBE prophylaxis is required for the patient.  I will order amoxicillin 2 g to be taken 30-60 minutes prior to her procedure.  I will route this recommendation to the requesting party via Epic fax function and remove from pre-op pool.  Please call with questions.  Deberah Pelton, NP 12/31/2020, 11:06 AM

## 2021-01-01 ENCOUNTER — Telehealth: Payer: Self-pay | Admitting: *Deleted

## 2021-01-01 NOTE — Telephone Encounter (Signed)
The patient's son called in with concerns about the patient's swelling. He stated that her weight has been staying steady at around 166-167. He stated that she is not having any shortness of breath and they do not really watch her sodium intake. "She is 65 and we give her what she wants." She does elevate her legs at times. The swelling gets better overnight most of the time per the son.  He stated that her prescription for Furosemide is 40 mg for a weight under 165 and 80 mg for a weight over 165. He stated that he gives her 80 once in a while and then 40 mg once in a while but nothing consistent. When they are going out he does not give her any. He stated that she does not like to have the Furosemide and complains about it.  He has been advised to give her 80 mg daily until her weight hits 165 and then he can give her 40 mg and has been educated on the reasoning for the Furosemide but once again said she doesn't like to take it. He was also offered an appointment to see Dr Sallyanne Kuster but stated that this is too difficult.

## 2021-01-01 NOTE — Telephone Encounter (Signed)
Left a message for the patient's son to call back concerning the message   In regard to the pt's son is concerned about the increased swelling for the pt. I asked where is the swelling located. Pt's son indicated in pt's ankles and calves. He states it has become worse lately and that her weight is increasing. He states her weight in October was running in the 150's and now she is running in the 160's the last couple of weeks. I assured pt's son that I will send a message to Dr. Sallyanne Kuster nurse and have her call him back for further information. Pt's son thanked me for the call and the help.

## 2021-01-01 NOTE — Telephone Encounter (Signed)
See duplicate note. 

## 2021-01-06 MED ORDER — FUROSEMIDE 40 MG PO TABS: ORAL_TABLET | ORAL | 5 refills | Status: AC

## 2021-01-06 NOTE — Telephone Encounter (Signed)
Left a message for the patient's son to call back. The patient's son will need to be consistent with the furosemide to help keep the patient's fluid off.

## 2021-01-11 NOTE — Telephone Encounter (Signed)
Left a message for the patient's son to call back.  

## 2021-01-11 NOTE — Telephone Encounter (Signed)
[name] is returning [name]'s call.

## 2021-01-12 NOTE — Telephone Encounter (Signed)
Spoke with the patient's daughter, [name]. She stated that she feels like the swelling is getting worse bilaterally.   She denies any shortness of breath.  An appointment has been made for 12/1 with Dr. Sallyanne Kuster.

## 2021-01-14 ENCOUNTER — Ambulatory Visit (INDEPENDENT_AMBULATORY_CARE_PROVIDER_SITE_OTHER): Payer: Medicare PPO | Admitting: Cardiovascular Disease

## 2021-01-14 ENCOUNTER — Other Ambulatory Visit: Payer: Self-pay

## 2021-01-14 VITALS — BP 142/82 | HR 95 | Ht 63.0 in | Wt 166.4 lb

## 2021-01-14 DIAGNOSIS — Z7901 Long term (current) use of anticoagulants: Secondary | ICD-10-CM | POA: Diagnosis not present

## 2021-01-14 DIAGNOSIS — I4821 Permanent atrial fibrillation: Secondary | ICD-10-CM

## 2021-01-14 DIAGNOSIS — I5032 Chronic diastolic (congestive) heart failure: Secondary | ICD-10-CM

## 2021-01-14 DIAGNOSIS — I4729 Other ventricular tachycardia: Secondary | ICD-10-CM

## 2021-01-14 DIAGNOSIS — Z950 Presence of cardiac pacemaker: Secondary | ICD-10-CM | POA: Diagnosis not present

## 2021-01-14 DIAGNOSIS — I10 Essential (primary) hypertension: Secondary | ICD-10-CM

## 2021-01-14 DIAGNOSIS — Z953 Presence of xenogenic heart valve: Secondary | ICD-10-CM | POA: Diagnosis not present

## 2021-01-14 DIAGNOSIS — I872 Venous insufficiency (chronic) (peripheral): Secondary | ICD-10-CM

## 2021-01-14 NOTE — Progress Notes (Signed)
Cardiology Office Note   Date:  01/16/2021   ID:  [name], DOB 1929-04-07, MRN [id number]  PCP:  Lawerance Cruel, MD  Cardiologist:  Sanda Klein, MD  Electrophysiologist:  None   Evaluation Performed:  Follow-Up Visit  Chief Complaint:  Pacemaker; atrial fibrillation, edema  History of Present Illness:    [name] is a 87 y.o. female with with long-standing atrial fibrillation with slow ventricular response and permanent pacemaker (recent generator change July 2020, Medtronic), systemic hypertension and hyperlipidemia, chronic diastolic heart failure and a biological mitral valve prosthesis (2011, 29 mm Biocor, Dr. Prescott Gum, with left atrial appendage ligation) with moderate prosthetic valve stenosis (mean gradient 9 mmHg at 75 bpm).  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  Recently issues have been related to increasing lower extremity edema.  Her son has been reluctant to give her higher doses of diuretics, since it is difficult for her to make it to the bathroom.  Going over her diet with her daughter today, it is also fairly clear that there are numerous sources of high sodium in her diet including deli meats and canned goods.  She also has very severe varicose veins of the lower extremity that contribute to her edema.  Today she only has roughly 2+ edema and has prominent cyanosis of the feet as well as bulging varicose veins in both calves.  She denies shortness of breath at rest, but is occasionally short of breath when walking in the house.  She does not have orthopnea or PND.  She has never complained of chest pain.  She has not had any recent issues of dizziness, palpitations, syncope or falls.  She is actually fairly active, her pacemaker documents 2.6 hours of motion every day.  She has not had any falls or injuries  or serious bleeding problems on Eliquis.  Pacemaker interrogation shows normal findings.  Her generator was changed out in 2020 and has almost 10 years of remaining longevity.  She has a dual-chamber device that has been programmed VVIR for permanent atrial fibrillation.  Despite a tiny dose of metoprolol succinate 25 mg a day, she has slow ventricular rates and has 92% ventricular pacing.  The heart rate histogram distribution is good.  She has had 5 episodes of nonsustained ventricular tachycardia since her last check, all of them very brief.  The longest one consists of 2 consecutive 11 beat and 4 beat runs separated by 1 normal beat.  All of these appear to be completely asymptomatic.  Underlying rhythm is atrial fibrillation with slow ventricular response at about 50 bpm, she is not pacemaker dependent.  Her metabolic parameters are good.  Her most recent creatinine was 1.1, potassium 5.2, hemoglobin A1c 5.2% LDL cholesterol 71, HDL 79.  Her most recent hemoglobin was normal at 12.6.  Her usual blood pressure at home is around 120s/60s, a little higher in the office, as has usually been the case.     Past Medical History:  Diagnosis Date   Anxiety    Arthritis    "mostly my hands" (01/29/2015)   Basal cell carcinoma of forehead    Benign essential tremor    CAP (community acquired pneumonia) 01/29/2015   CHF (congestive heart failure) ([place])    Dementia ([place]) 10/18/2018   Depression    Edema of both legs 04-23-2013  pt states feet swelling (wears ted hose)   bilateral venous ablation procedures done in IR, L>R   GERD (gastroesophageal reflux disease)    H/O hiatal hernia    Hyperlipidemia    Hyperparathyroidism ([place])    MILD--  STABLE   Hypertension    Kidney stones    Osteopenia    Permanent atrial fibrillation ([place])    CARDIOLOGIST-- DR Sallyanne Kuster   Pneumonia "several times"   Presence of permanent cardiac pacemaker    LAST PACER CHECK  04-23-2013  IN [facility]   Right ureteral  stone    S/P mitral valve replacement with bioprosthetic valve    04-03-2009   Schatzki's ring    Sick sinus syndrome ([place])    S/P  PACEMAKER  2009   Stroke ([place]) 08/2018   Stroke ([place])    Thrombocytopenia, immune ([place])    CHRONIC---  HEMOTOLOGIST---  DR ENNEVER   Venous insufficiency, peripheral    Wears glasses    Past Surgical History:  Procedure Laterality Date   CARDIAC CATHETERIZATION  03/27/2009   normal coronaries; grade IV Mitral Insuff.   CARDIAC CATHETERIZATION  2001  &  2006   CARDIAC VALVE REPLACEMENT     CATARACT EXTRACTION W/ INTRAOCULAR LENS  IMPLANT, BILATERAL Bilateral ~ 2012   CYSTOSCOPY W/ URETERAL STENT PLACEMENT Left 08/01/2012   Procedure: CYSTOSCOPY WITH RETROGRADE PYELOGRAM/URETERAL STENT PLACEMENT;  Surgeon: Dutch Gray, MD;  Location: [facility];  Service: Urology;  Laterality: Left;   CYSTOSCOPY WITH URETEROSCOPY Left 08/14/2012   Procedure: LEFT URETEROSCOPY WITH HOLMIUM LASER AND LEFT  STENT PLACEMENT;  Surgeon: Malka So, MD;  Location: [facility];  Service: Urology;  Laterality: Left;   CYSTOSCOPY WITH URETEROSCOPY AND STENT PLACEMENT Right 04/25/2013   Procedure: RIGHT URETEROSCOPY WITH  STENT PLACEMENT;  Surgeon: Irine Seal, MD;  Location: [facility];  Service: Urology;  Laterality: Right;   ESOPHAGOGASTRODUODENOSCOPY (EGD) WITH ESOPHAGEAL DILATION  "a couple times" (01/29/2015)   EXTRACORPOREAL SHOCK WAVE LITHOTRIPSY Left 07-26-2012   HAND SURGERY Right 05/2011   "no fracture; straightened things up   HAND SURGERY Left 2014   "no fracture; straightened things up"   HOLMIUM LASER APPLICATION Left 09/17/1515   Procedure:  HOLMIUM LASER APPLICATION;  Surgeon: Malka So, MD;  Location: [facility];  Service: Urology;  Laterality: Left;   HOLMIUM LASER APPLICATION Right 07/31/735   Procedure: HOLMIUM LASER APPLICATION;  Surgeon: Irine Seal, MD;  Location[facility];  Service: Urology;  Laterality: Right;   MITRAL VALVE REPLACEMENT  WITH CHORDAL PRESEVATION USING BIOPROSTHETIC VALVE/  LIGATION LEFT ATRIAL APPENDAGE/  LEFT-SIDED MAZE PROCEDURE  04-03-2009  DR Ethel Rana,  SERIAL # [id number]   NM MYOCAR PERF WALL MOTION  03/01/1999   mild lateral ischemia   PERMANENT PACEMAKER INSERTION  02/26/2007   Dual Chamber Medtronic Programmed VVIR--- Generator [id number]/  #[id number]    PPM GENERATOR CHANGEOUT N/A 08/20/2018   Procedure: PPM GENERATOR CHANGEOUT;  Surgeon: Sanda Klein, MD;  Location: [facility];  Service: Cardiovascular;  Laterality: N/A;   TEE WITHOUT CARDIOVERSION N/A 09/05/2018   Procedure: TRANSESOPHAGEAL ECHOCARDIOGRAM (TEE);  Surgeon: Pixie Casino, MD;  Location: [facility];  Service: Cardiovascular;  Laterality: N/A;   TRANSTHORACIC ECHOCARDIOGRAM  09-07-2012  DR Delwin Raczkowski   MILD LVH/  EF 55-60%/  MILD TO MODERATE AR/   SEVERE LAE  &  RAE/  MILD TO MODERATE TR/   MITRAL VALVE BIOPROSTHESIS LEAFLET NORMAL,  MODERATE  STENOSIS WITH NO SIG. REGURG/  MODERATE RVE   VAGINAL HYSTERECTOMY  1982     Current Meds  Medication Sig   acetaminophen (TYLENOL) 500 MG tablet Take 500-1,000 mg by mouth daily as needed for moderate pain or headache.    ALPRAZolam (XANAX) 0.5 MG tablet Take 0.5-1 tablets (0.25-0.5 mg total) by mouth See admin instructions. Take 1/2 tablet every morning and take 1 tablet at night   Ascorbic Acid (VITAMIN C) 1000 MG tablet Take 500 mg by mouth daily.    b complex vitamins capsule Take 1 capsule by mouth daily.   cephALEXin (KEFLEX) 500 MG capsule Take 500 mg by mouth daily. For five days   Cholecalciferol (VITAMIN D) 50 MCG (2000 UT) tablet Take 2,000 Units by mouth daily.   CVS VITAMIN E 1000 units capsule Take 450 capsules by mouth daily.   donepezil (ARICEPT) 5 MG tablet Take 5 mg by mouth at bedtime.   ELIQUIS 5 MG TABS tablet Take 5 mg by mouth 2 (two) times daily.   furosemide (LASIX) 40 MG tablet Take 40 mg once daily for a weight 160-165 lbs. For a weight 165 or over,  take 80 mg (2 tablets)   Melatonin 10 MG TABS Take 1 tablet by mouth at bedtime.   metoprolol succinate (TOPROL-XL) 25 MG 24 hr tablet TAKE 1 TABLET BY MOUTH EVERY DAY   Nutritional Supplements (IMMUNE ENHANCE) TABS Take 1 tablet by mouth daily.   olmesartan (BENICAR) 20 MG tablet Take 1 tablet (20 mg total) by mouth daily.   omeprazole (PRILOSEC) 20 MG capsule Take 20 mg by mouth daily.   OVER THE COUNTER MEDICATION Take 1 tablet by mouth 2 (two) times daily. Nutrifuron (immune supplement)   potassium chloride (KLOR-CON) 8 MEQ tablet TAKE 3 TABLETS (24 MEQ) WHEN YOU TAKE THE AS NEEDED FUROSEMIDE.   Probiotic Product (PROBIOTIC-10 ULTIMATE PO) Take 1 capsule by mouth daily.   simvastatin (ZOCOR) 10 MG tablet TAKE 1 TABLET BY MOUTH EVERY DAY AT DINNER (Patient taking differently: Take 10 mg by mouth at bedtime.)   Vit B6-Vit B12-Omega 3 Acids (VITAMIN B PLUS+ PO) Take 1 tablet by mouth daily.     Allergies:   Zoloft [sertraline hcl]   Social History   Tobacco Use   Smoking status: Never   Smokeless tobacco: Never   Tobacco comments:    never used tobacco  Vaping Use   Vaping Use: Never used  Substance Use Topics   Alcohol use: No    Alcohol/week: 0.0 standard drinks   Drug use: No     Family Hx: The patient's family history includes Cancer in her mother; Heart attack in her father.  ROS:   Please see the history of present illness.    All other systems are reviewed and are negative.  Prior CV studies:   The following studies were reviewed today: See report of comprehensive pacemaker check performed in the office today.  Labs/Other Tests and Data Reviewed:   Echo 04/16/2019  1. Left ventricular ejection fraction, by estimation, is 50 to 55%. The left ventricle has low normal function. The left ventricle has no regional wall motion abnormalities. Left ventricular diastolic function could not be evaluated. Left ventricular diastolic function could not be evaluated.   2. Right  ventricular systolic function is normal. The right ventricular size is mildly enlarged. There is moderately elevated pulmonary artery systolic pressure.   3. Left atrial size was severely dilated.   4. Right atrial size was  severely dilated.   5. The mitral valve has been repaired/replaced. No evidence of mitral valve regurgitation. Mild mitral stenosis. The mean mitral valve gradient is 7.0 mmHg with average heart rate of 70 bpm. There is a 29 mm Biocor bioprosthetic valve present in the  mitral position. Procedure Date: 2011. Echo findings are consistent with normal structure and function of the mitral valve prosthesis.   6. Tricuspid valve regurgitation is moderate.   7. The aortic valve is tricuspid. Aortic valve regurgitation is mild.  Mild aortic valve sclerosis is present, with no evidence of aortic valve stenosis.   8. There is borderline dilatation of the ascending aorta measuring 37 mm.   9. The inferior vena cava is dilated in size with >50% respiratory  variability, suggesting right atrial pressure of 8 mmHg.   Comparison(s): Changes from prior study are noted. Left ventricular systolic function is lower, but there are no regional wall motion abnormalities. There is no change in mitral prosthesis gradients, allowing for changes in heart rate.   EKG: Not ordered today.  The tracing from 10/12/2020 was personally reviewed and shows atrial fibrillation with almost entirely ventricular paced rhythm  Recent Labs: No results found for requested labs within last 8760 hours.  03/25/2020 creatinine 1.1, potassium 5.2, hemoglobin A1c 5.2%, hemoglobin 12.6.  Recent Lipid Panel Lab Results  Component Value Date/Time   CHOL 160 11/02/2018 03:27 AM   TRIG 73 11/02/2018 03:27 AM   HDL 63 11/02/2018 03:27 AM   CHOLHDL 2.5 11/02/2018 03:27 AM   LDLCALC 82 11/02/2018 03:27 AM  03/25/2020 Cholesterol 170,LDL cholesterol 71, HDL 79, triglycerides 118  Wt Readings from Last 3 Encounters:   01/14/21 166 lb 6.4 oz (75.5 kg)  10/12/20 160 lb (72.6 kg)  02/28/20 146 lb 12.8 oz (66.6 kg)     Objective:    Vital Signs:  BP (!) 142/82 (BP Location: Left Arm, Patient Position: Sitting, Cuff Size: Normal)   Pulse 95   Ht 5\' 3"  (1.6 m)   Wt 166 lb 6.4 oz (75.5 kg)   SpO2 96%   BMI 29.48 kg/m     General: Alert, oriented x3, no distress, appears very comfortable Head: no evidence of trauma, PERRL, EOMI, no exophtalmos or lid lag, no myxedema, no xanthelasma; normal ears, nose and oropharynx Neck: Mildly elevated jugular venous pulsations 6 cm above sternoclavicular angle and no hepatojugular reflux; brisk carotid pulses without delay and no carotid bruits Chest: clear to auscultation, no signs of consolidation by percussion or palpation, normal fremitus, symmetrical and full respiratory excursions Cardiovascular: normal position and quality of the apical impulse, irregular rhythm, normal first and second heart sounds, 2/6 early peaking aortic ejection murmur, no diastolic murmurs, rubs or gallops Abdomen: no tenderness or distention, no masses by palpation, no abnormal pulsatility or arterial bruits, normal bowel sounds, no hepatosplenomegaly Extremities: Cyanosis of the feet bilaterally, 2+ edema of the ankles and feet, very prominent bilateral varicose veins of the lower extremities; skin is wrinkled, suggesting edema was worse in the past; 2+ radial, ulnar and brachial pulses bilaterally; 2+ right femoral, posterior tibial and dorsalis pedis pulses; 2+ left femoral, posterior tibial and dorsalis pedis pulses; no subclavian or femoral bruits Neurological: grossly nonfocal Psych: Normal mood and affect  ASSESSMENT & PLAN:    1. Permanent atrial fibrillation ([place])   2. Long term (current) use of anticoagulants   3. Pacemaker   4. NSVT (nonsustained ventricular tachycardia)   5. S/P mitral valve replacement with bioprosthetic valve  6. Chronic diastolic heart failure ([place])    7. Essential hypertension   8. Edema of both lower extremities due to peripheral venous insufficiency        AFib: Well rate controlled.  Beta-blockers are prescribed for treatment of nonsustained VT, not for atrial fibrillation.  She has had her left atrial appendage clipped but despite this developed very large left atrial thrombi and had a stroke manifesting with aphasia, so she is on anticoagulation.     Anticoagulation: Denies falls or bleeding problems. PM :   Normal device function.  Remote downloads every 3 months. NSVT: Occasionally lengthy up to 30 beats have been documented in the past, overall burden of nonsustained VT around 1-4 events every month.  Never symptomatic.  Continue low-dose metoprolol. S/P bioMVR: Moderately elevated gradients across the prosthesis, but without signs or symptoms of mitral stenosis.  Avoid tachycardia.. CHF: Previously assessed her "dry weight" to be around 150 pounds.  She is well above this.  I suspect she has at least 10 pounds of excess fluid weight today based on physical exam, although she may have gained a little bit of true weight as well.  Apparently her appetite is very good.  She does not have any symptoms or signs of left heart failure today.  We had a lengthy discussion regarding the importance of dietary sodium restriction, weight monitoring, diuretic dose adjustment based on weight and symptoms. HTN: Always has a slightly elevated systolic blood pressure when seen in the office, but consistently normal when dutifully checked and documented by her family at home. Varicose veins: Her edema is also clearly related to severe peripheral venous insufficiency.  Importantly keep the legs elevated as much as possible.   Medication Adjustments/Labs and Tests Ordered: Current medicines are reviewed at length with the patient today.  Concerns regarding medicines are outlined above.   Patient Instructions  Medication Instructions:  No changes *If  you need a refill on your cardiac medications before your next appointment, please call your pharmacy*   Lab Work: None ordered If you have labs (blood work) drawn today and your tests are completely normal, you will receive your results only by: [place] (if you have MyChart) OR A paper copy in the mail If you have any lab test that is abnormal or we need to change your treatment, we will call you to review the results.   Testing/Procedures: None ordered   Follow-Up: At [facility], you and your health needs are our priority.  As part of our continuing mission to provide you with exceptional heart care, we have created designated Provider Care Teams.  These Care Teams include your primary Cardiologist (physician) and Advanced Practice Providers (APPs -  Physician Assistants and Nurse Practitioners) who all work together to provide you with the care you need, when you need it.  We recommend signing up for the patient portal called "MyChart".  Sign up information is provided on this After Visit Summary.  MyChart is used to connect with patients for Virtual Visits (Telemedicine).  Patients are able to view lab/test results, encounter notes, upcoming appointments, etc.  Non-urgent messages can be sent to your provider as well.   To learn more about what you can do with MyChart, go to [email or web address].    Your next appointment:   4 month(s) on a device day  The format for your next appointment:   In Person  Provider:   Sanda Klein, MD      Signed, Sanda Klein, MD  01/16/2021 1:22 PM    [facility]

## 2021-01-14 NOTE — Patient Instructions (Signed)
Medication Instructions:  No changes *If you need a refill on your cardiac medications before your next appointment, please call your pharmacy*   Lab Work: None ordered If you have labs (blood work) drawn today and your tests are completely normal, you will receive your results only by: [place] (if you have MyChart) OR A paper copy in the mail If you have any lab test that is abnormal or we need to change your treatment, we will call you to review the results.   Testing/Procedures: None ordered   Follow-Up: At [facility], you and your health needs are our priority.  As part of our continuing mission to provide you with exceptional heart care, we have created designated Provider Care Teams.  These Care Teams include your primary Cardiologist (physician) and Advanced Practice Providers (APPs -  Physician Assistants and Nurse Practitioners) who all work together to provide you with the care you need, when you need it.  We recommend signing up for the patient portal called "MyChart".  Sign up information is provided on this After Visit Summary.  MyChart is used to connect with patients for Virtual Visits (Telemedicine).  Patients are able to view lab/test results, encounter notes, upcoming appointments, etc.  Non-urgent messages can be sent to your provider as well.   To learn more about what you can do with MyChart, go to [email or web address].    Your next appointment:   4 month(s) on a device day  The format for your next appointment:   In Person  Provider:   Sanda Klein, MD

## 2021-01-16 ENCOUNTER — Encounter: Payer: Self-pay | Admitting: Cardiovascular Disease

## 2021-02-19 ENCOUNTER — Other Ambulatory Visit: Payer: Self-pay | Admitting: Cardiovascular Disease

## 2021-02-25 ENCOUNTER — Ambulatory Visit (INDEPENDENT_AMBULATORY_CARE_PROVIDER_SITE_OTHER): Payer: Medicare PPO

## 2021-02-25 DIAGNOSIS — I495 Sick sinus syndrome: Secondary | ICD-10-CM | POA: Diagnosis not present

## 2021-02-25 LAB — CUP PACEART REMOTE DEVICE CHECK
Battery Remaining Longevity: 115 mo
Battery Voltage: 3.01 V
Brady Statistic AP VP Percent: 0 %
Brady Statistic AP VS Percent: 0 %
Brady Statistic AS VP Percent: 90.56 %
Brady Statistic AS VS Percent: 9.44 %
Brady Statistic RA Percent Paced: 0 %
Brady Statistic RV Percent Paced: 90.56 %
Date Time Interrogation Session: 20230110231731
Lead Channel Impedance Value: 323 Ohm
Lead Channel Impedance Value: 342 Ohm
Lead Channel Impedance Value: 380 Ohm
Lead Channel Impedance Value: 513 Ohm
Lead Channel Pacing Threshold Amplitude: 0.875 V
Lead Channel Pacing Threshold Pulse Width: 0.4 ms
Lead Channel Sensing Intrinsic Amplitude: 0.875 mV
Lead Channel Sensing Intrinsic Amplitude: 2 mV
Lead Channel Sensing Intrinsic Amplitude: 2 mV
Lead Channel Setting Pacing Amplitude: 2 V
Lead Channel Setting Pacing Pulse Width: 0.6 ms
Lead Channel Setting Sensing Sensitivity: 1.2 mV

## 2021-03-01 ENCOUNTER — Other Ambulatory Visit: Payer: Self-pay | Admitting: Cardiovascular Disease

## 2021-03-08 ENCOUNTER — Telehealth: Payer: Self-pay | Admitting: Cardiovascular Disease

## 2021-03-08 NOTE — Telephone Encounter (Signed)
*  STAT* If patient is at the pharmacy, call can be transferred to refill team.   1. Which medications need to be refilled? (please list name of each medication and dose if known)  Amoxicillin for dental work at 1:00 PM today  2. Which pharmacy/location (including street and city if local pharmacy) is medication to be sent to? [id number] - [place], [address]  3. Do they need a 30 day or 90 day supply?  Standard supply for dental work (see 12/31/20 encounter). Pre-op unavailable for advisement.

## 2021-03-09 NOTE — Progress Notes (Signed)
Remote pacemaker transmission.   

## 2021-03-26 DIAGNOSIS — Z743 Need for continuous supervision: Secondary | ICD-10-CM | POA: Diagnosis not present

## 2021-04-14 DIAGNOSIS — 419620001 Death: Secondary | SNOMED CT | POA: Diagnosis not present

## 2021-04-14 DEATH — deceased

## 2021-05-17 ENCOUNTER — Encounter: Payer: Medicare PPO | Admitting: Cardiovascular Disease
